# Patient Record
Sex: Male | Born: 1950
Health system: Southern US, Community
[De-identification: ages and names within clinical notes are randomized; demographics above are authoritative.]

## PROBLEM LIST (undated history)

## (undated) ENCOUNTER — Emergency Department (HOSPITAL_COMMUNITY): Admission: EM | Payer: Medicare HMO | Source: Home / Self Care

## (undated) DIAGNOSIS — I10 Essential (primary) hypertension: Secondary | ICD-10-CM

## (undated) DIAGNOSIS — F0280 Dementia in other diseases classified elsewhere without behavioral disturbance: Secondary | ICD-10-CM

## (undated) DIAGNOSIS — G309 Alzheimer's disease, unspecified: Secondary | ICD-10-CM

## (undated) DIAGNOSIS — G40909 Epilepsy, unspecified, not intractable, without status epilepticus: Secondary | ICD-10-CM

## (undated) DIAGNOSIS — N400 Enlarged prostate without lower urinary tract symptoms: Secondary | ICD-10-CM

## (undated) DIAGNOSIS — N189 Chronic kidney disease, unspecified: Secondary | ICD-10-CM

## (undated) HISTORY — DX: Essential (primary) hypertension: I10

## (undated) HISTORY — DX: Epilepsy, unspecified, not intractable, without status epilepticus: G40.909

## (undated) HISTORY — PX: NO PAST SURGERIES: SHX2092

---

## 2007-12-25 ENCOUNTER — Ambulatory Visit (HOSPITAL_COMMUNITY): Admission: RE | Admit: 2007-12-25 | Discharge: 2007-12-25 | Payer: Self-pay | Admitting: Internal Medicine

## 2010-01-25 ENCOUNTER — Emergency Department (HOSPITAL_COMMUNITY): Admission: EM | Admit: 2010-01-25 | Discharge: 2010-01-25 | Payer: Self-pay | Admitting: Emergency Medicine

## 2010-02-07 ENCOUNTER — Inpatient Hospital Stay (HOSPITAL_COMMUNITY): Admission: EM | Admit: 2010-02-07 | Discharge: 2010-02-09 | Payer: Self-pay | Admitting: Emergency Medicine

## 2010-10-15 LAB — CBC
HCT: 42.5 % (ref 39.0–52.0)
Hemoglobin: 12.4 g/dL — ABNORMAL LOW (ref 13.0–17.0)
Hemoglobin: 14.6 g/dL (ref 13.0–17.0)
MCH: 31.3 pg (ref 26.0–34.0)
MCH: 31.3 pg (ref 26.0–34.0)
MCHC: 34.7 g/dL (ref 30.0–36.0)
MCHC: 34.7 g/dL (ref 30.0–36.0)
MCHC: 34.3 g/dL (ref 30.0–36.0)
MCV: 90.3 fL (ref 78.0–100.0)
Platelets: 136 10*3/uL — ABNORMAL LOW (ref 150–400)
RBC: 3.81 MIL/uL — ABNORMAL LOW (ref 4.22–5.81)
RBC: 3.95 MIL/uL — ABNORMAL LOW (ref 4.22–5.81)
RBC: 4.67 MIL/uL (ref 4.22–5.81)
RDW: 11.1 % — ABNORMAL LOW (ref 11.5–15.5)
WBC: 6.6 10*3/uL (ref 4.0–10.5)
WBC: 7.5 10*3/uL (ref 4.0–10.5)
WBC: 6.5 10*3/uL (ref 4.0–10.5)

## 2010-10-15 LAB — GLUCOSE, CAPILLARY
Glucose-Capillary: 156 mg/dL — ABNORMAL HIGH (ref 70–99)
Glucose-Capillary: 181 mg/dL — ABNORMAL HIGH (ref 70–99)
Glucose-Capillary: 212 mg/dL — ABNORMAL HIGH (ref 70–99)
Glucose-Capillary: 213 mg/dL — ABNORMAL HIGH (ref 70–99)
Glucose-Capillary: 218 mg/dL — ABNORMAL HIGH (ref 70–99)
Glucose-Capillary: 240 mg/dL — ABNORMAL HIGH (ref 70–99)
Glucose-Capillary: 241 mg/dL — ABNORMAL HIGH (ref 70–99)
Glucose-Capillary: 258 mg/dL — ABNORMAL HIGH (ref 70–99)
Glucose-Capillary: 288 mg/dL — ABNORMAL HIGH (ref 70–99)
Glucose-Capillary: 294 mg/dL — ABNORMAL HIGH (ref 70–99)
Glucose-Capillary: 321 mg/dL — ABNORMAL HIGH (ref 70–99)
Glucose-Capillary: 393 mg/dL — ABNORMAL HIGH (ref 70–99)
Glucose-Capillary: >600 mg/dL (ref 70–99)
Glucose-Capillary: 307 mg/dL — ABNORMAL HIGH (ref 70–99)
Glucose-Capillary: 509 mg/dL — ABNORMAL HIGH (ref 70–99)

## 2010-10-15 LAB — BASIC METABOLIC PANEL
BUN: 18 mg/dL (ref 6–23)
BUN: 42 mg/dL — ABNORMAL HIGH (ref 6–23)
CO2: 26 mEq/L (ref 19–32)
Calcium: 8.9 mg/dL (ref 8.4–10.5)
Calcium: 9.3 mg/dL (ref 8.4–10.5)
Calcium: 9.4 mg/dL (ref 8.4–10.5)
Calcium: 9.5 mg/dL (ref 8.4–10.5)
Chloride: 102 mEq/L (ref 96–112)
Chloride: 92 mEq/L — ABNORMAL LOW (ref 96–112)
Chloride: 98 mEq/L (ref 96–112)
Creatinine, Ser: 1.33 mg/dL (ref 0.4–1.5)
Creatinine, Ser: 2.26 mg/dL — ABNORMAL HIGH (ref 0.4–1.5)
GFR calc Af Amer: 56 mL/min — ABNORMAL LOW (ref >60)
GFR calc Af Amer: >60 mL/min (ref >60)
GFR calc Af Amer: >60 mL/min (ref >60)
GFR calc non Af Amer: 30 mL/min — ABNORMAL LOW (ref >60)
GFR calc non Af Amer: 53 mL/min — ABNORMAL LOW (ref >60)
Glucose, Bld: 133 mg/dL — ABNORMAL HIGH (ref 70–99)
Glucose, Bld: 202 mg/dL — ABNORMAL HIGH (ref 70–99)
Potassium: 4.5 mEq/L (ref 3.5–5.1)
Potassium: 5.3 mEq/L — ABNORMAL HIGH (ref 3.5–5.1)
Sodium: 131 mEq/L — ABNORMAL LOW (ref 135–145)
Sodium: 132 mEq/L — ABNORMAL LOW (ref 135–145)
Sodium: 133 mEq/L — ABNORMAL LOW (ref 135–145)
Sodium: 133 mEq/L — ABNORMAL LOW (ref 135–145)

## 2010-10-15 LAB — DIFFERENTIAL
Basophils Absolute: 0 10*3/uL (ref 0.0–0.1)
Basophils Relative: 0 % (ref 0–1)
Basophils Relative: 0 % (ref 0–1)
Eosinophils Absolute: 0.1 10*3/uL (ref 0.0–0.7)
Eosinophils Absolute: 0.1 10*3/uL (ref 0.0–0.7)
Eosinophils Absolute: 0.1 10*3/uL (ref 0.0–0.7)
Eosinophils Relative: 1 % (ref 0–5)
Eosinophils Relative: 1 % (ref 0–5)
Lymphocytes Relative: 24 % (ref 12–46)
Lymphocytes Relative: 27 % (ref 12–46)
Lymphs Abs: 1.8 10*3/uL (ref 0.7–4.0)
Lymphs Abs: 2.2 10*3/uL (ref 0.7–4.0)
Lymphs Abs: 1.8 10*3/uL (ref 0.7–4.0)
Monocytes Absolute: 0.6 10*3/uL (ref 0.1–1.0)
Monocytes Relative: 7 % (ref 3–12)
Monocytes Relative: 7 % (ref 3–12)
Neutro Abs: 4.6 10*3/uL (ref 1.7–7.7)
Neutro Abs: 5.5 10*3/uL (ref 1.7–7.7)
Neutrophils Relative %: 61 % (ref 43–77)
Neutrophils Relative %: 62 % (ref 43–77)
Neutrophils Relative %: 61 % (ref 43–77)

## 2010-10-15 LAB — URINALYSIS, ROUTINE W REFLEX MICROSCOPIC
Bilirubin Urine: NEGATIVE
Glucose, UA: >1000 mg/dL — AB
Glucose, UA: >1000 mg/dL — AB
Ketones, ur: NEGATIVE mg/dL
Ketones, ur: NEGATIVE mg/dL
Nitrite: NEGATIVE
Protein, ur: NEGATIVE mg/dL
Specific Gravity, Urine: 1.015 (ref 1.005–1.030)
Urobilinogen, UA: 0.2 mg/dL (ref 0.0–1.0)
pH: 5 (ref 5.0–8.0)

## 2010-10-15 LAB — URINE MICROSCOPIC-ADD ON

## 2010-10-15 LAB — PHOSPHORUS
Phosphorus: 3.9 mg/dL (ref 2.3–4.6)
Phosphorus: 4.1 mg/dL (ref 2.3–4.6)
Phosphorus: 4.6 mg/dL (ref 2.3–4.6)

## 2010-10-15 LAB — MAGNESIUM
Magnesium: 2.1 mg/dL (ref 1.5–2.5)
Magnesium: 2.1 mg/dL (ref 1.5–2.5)
Magnesium: 2.2 mg/dL (ref 1.5–2.5)

## 2010-10-15 LAB — HEMOGLOBIN A1C
Hgb A1c MFr Bld: 15 % — ABNORMAL HIGH (ref <5.7)
Mean Plasma Glucose: 384 mg/dL — ABNORMAL HIGH (ref <117)

## 2010-10-15 LAB — COMPREHENSIVE METABOLIC PANEL
ALT: 98 U/L — ABNORMAL HIGH (ref 0–53)
AST: 105 U/L — ABNORMAL HIGH (ref 0–37)
Alkaline Phosphatase: 112 U/L (ref 39–117)
CO2: 27 mEq/L (ref 19–32)
Calcium: 9.7 mg/dL (ref 8.4–10.5)
Chloride: 92 mEq/L — ABNORMAL LOW (ref 96–112)
GFR calc Af Amer: 54 mL/min — ABNORMAL LOW (ref >60)
GFR calc non Af Amer: 44 mL/min — ABNORMAL LOW (ref >60)
Glucose, Bld: 644 mg/dL (ref 70–99)
Potassium: 4.7 mEq/L (ref 3.5–5.1)
Sodium: 129 mEq/L — ABNORMAL LOW (ref 135–145)

## 2010-10-15 LAB — BLOOD GAS, ARTERIAL: Patient temperature: 37

## 2010-10-15 LAB — LIPID PANEL
Cholesterol: 113 mg/dL (ref 0–200)
Triglycerides: 197 mg/dL — ABNORMAL HIGH (ref <150)

## 2010-10-15 LAB — KETONES, QUALITATIVE: Acetone, Bld: NEGATIVE

## 2010-10-15 LAB — CK TOTAL AND CKMB (NOT AT ARMC)
CK, MB: 1.9 ng/mL (ref 0.3–4.0)
Relative Index: 1.7 (ref 0.0–2.5)

## 2010-10-15 LAB — TROPONIN I: Troponin I: 0.01 ng/mL (ref 0.00–0.06)

## 2010-10-15 LAB — GLUCOSE, RANDOM: Glucose, Bld: 434 mg/dL — ABNORMAL HIGH (ref 70–99)

## 2010-10-15 LAB — LACTIC ACID, PLASMA: Lactic Acid, Venous: 4.5 mmol/L — ABNORMAL HIGH (ref 0.5–2.2)

## 2010-12-15 NOTE — Procedures (Signed)
Tony [name], LACASSE                   ACCOUNT NO.:  [id number]   MEDICAL RECORD NO.:  [id number]          PATIENT TYPE:  OUT   LOCATION:  RESP                          FACILITY:  [facility]   PHYSICIAN:  Kofi A. Gerilyn Pilgrim, M.D. DATE OF BIRTH:  1950/11/02   DATE OF PROCEDURE:  DATE OF DISCHARGE:  12/25/2007                              EEG INTERPRETATION   INDICATIONS:  This is a 60 year old male who presents with spell  suspicious for seizure activity.  The patient does not have a history of  seizure disorder at baseline.   BASELINE MEDICATIONS:  1. Dilantin.  2. Tegretol.   ANALYSIS:  A 16-channel recording is conducted for 20 minutes.  There is  well-formed posterior rhythm of 9.5 Hz, which attenuates with the eye  opening. There is beta activity noted in the frontal areas.  Awake and  drowsy activities are recorded.  Photic stimulation and hyperventilation  are conducted without significant changes in the background activity.  There is no focal or lateralized slowing. There is no epileptiform  activity observed.   IMPRESSION:  Normal recording of awake and drowsy states.  A single  recording does not rule out epileptic seizures.  It clinically indicated  a prolonged EEG or sleep deprived recording may be useful.      Kofi A. Gerilyn Pilgrim, M.D.  Electronically Signed     KAD/MEDQ  D:  12/29/2007  T:  12/29/2007  Job:  [id number]

## 2011-12-12 ENCOUNTER — Ambulatory Visit (INDEPENDENT_AMBULATORY_CARE_PROVIDER_SITE_OTHER): Payer: Self-pay | Admitting: Internal Medicine

## 2011-12-12 ENCOUNTER — Encounter: Payer: Self-pay | Admitting: Internal Medicine

## 2011-12-12 DIAGNOSIS — I10 Essential (primary) hypertension: Secondary | ICD-10-CM

## 2011-12-12 DIAGNOSIS — Z79899 Other long term (current) drug therapy: Secondary | ICD-10-CM

## 2011-12-12 DIAGNOSIS — R7303 Prediabetes: Secondary | ICD-10-CM | POA: Insufficient documentation

## 2011-12-12 DIAGNOSIS — E119 Type 2 diabetes mellitus without complications: Secondary | ICD-10-CM

## 2011-12-12 DIAGNOSIS — G40909 Epilepsy, unspecified, not intractable, without status epilepticus: Secondary | ICD-10-CM | POA: Insufficient documentation

## 2011-12-12 LAB — CBC WITH DIFFERENTIAL/PLATELET
Basophils Absolute: 0 10*3/uL (ref 0.0–0.1)
Basophils Relative: 1 % (ref 0–1)
Lymphocytes Relative: 42 % (ref 12–46)
MCHC: 33.3 g/dL (ref 30.0–36.0)
Neutro Abs: 3.8 10*3/uL (ref 1.7–7.7)
Neutrophils Relative %: 53 % (ref 43–77)
RDW: 13.1 % (ref 11.5–15.5)
WBC: 7.2 10*3/uL (ref 4.0–10.5)

## 2011-12-12 LAB — COMPREHENSIVE METABOLIC PANEL
ALT: 15 U/L (ref 0–53)
AST: 17 U/L (ref 0–37)
Albumin: 4.4 g/dL (ref 3.5–5.2)
BUN: 13 mg/dL (ref 6–23)
CO2: 24 mEq/L (ref 19–32)
Calcium: 9.5 mg/dL (ref 8.4–10.5)
Chloride: 105 mEq/L (ref 96–112)
Creat: 1.26 mg/dL (ref 0.50–1.35)
Potassium: 4 mEq/L (ref 3.5–5.3)

## 2011-12-12 LAB — POCT GLYCOSYLATED HEMOGLOBIN (HGB A1C): Hemoglobin A1C: 6.1

## 2011-12-12 LAB — LIPID PANEL: LDL Cholesterol: 58 mg/dL (ref 0–99)

## 2011-12-12 LAB — GLUCOSE, CAPILLARY: Glucose-Capillary: 88 mg/dL (ref 70–99)

## 2011-12-12 MED ORDER — METFORMIN HCL 1000 MG PO TABS: 1000.0000 mg | ORAL_TABLET | Freq: Two times a day (BID) | ORAL | Status: DC

## 2011-12-12 MED ORDER — LISINOPRIL-HYDROCHLOROTHIAZIDE 10-12.5 MG PO TABS: 1.0000 | ORAL_TABLET | Freq: Every day | ORAL | Status: DC

## 2011-12-12 NOTE — Progress Notes (Signed)
Subjective:     Patient ID: Tony [name], male   DOB: [birth year], 61 y.o.   MRN: [id number]  HPI [name] is a pleasant 61 year old gentleman with hx of HTN, DM, and seizure disorder who presents to the clinic as a new patient to establish care. He was previously seeing a Dr. Loleta Chance at the [facility], but due to financial reasons, he has chosen to relocate. 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   He has no complaints or concerns today. He denies chest pain, cough, sob, headache, N/V, changes in abdominal and urinary character.  He has not had seizures in several years and can't remember his last seizure. Furthermore he has not taken his AED since 2011.  He checks his CBGs daily which are in the 100s. No low CBGs, denies hypoglycemic episodes such as dizziness or lightheadedness. Exercises regularly and watches diet closely.    Review of Systems  All other systems reviewed and are negative.       Objective:   Physical Exam  Constitutional: He is oriented to person, place, and time. He appears well-developed.  HENT:  Head: Normocephalic and atraumatic.  Eyes: EOM are normal. Pupils are equal, round, and reactive to light.  Neck: Normal range of motion. Neck supple.  Cardiovascular: Normal rate, regular rhythm and normal heart sounds.  Exam reveals no gallop and no friction rub.   No murmur heard. Pulmonary/Chest: Effort normal and breath sounds normal.  Abdominal: Soft. Bowel sounds are normal.  Musculoskeletal: Normal range of motion.  Neurological: He is alert and oriented to person, place, and time.  Psychiatric: He has a normal mood and affect.

## 2011-12-12 NOTE — Assessment & Plan Note (Signed)
Lab Results  Component Value Date   HGBA1C 6.1 12/12/2011   HGBA1C  Value: 15.0 (NOTE)                                                                       According to the ADA Clinical Practice Recommendations for 2011, when HbA1c is used as a screening test:   >=6.5%   Diagnostic of Diabetes Mellitus           (if abnormal result  is confirmed)  5.7-6.4%   Increased risk of developing Diabetes Mellitus  References:Diagnosis and Classification of Diabetes Mellitus,Diabetes Care,2011,34(Suppl 1):S62-S69 and Standards of Medical Care in         Diabetes - 2011,Diabetes Care,2011,34  (Suppl 1):S11-S61.* 02/07/2010   CREATININE 1.33 02/09/2010   CHOL  Value: 113        ATP III CLASSIFICATION:  <200     mg/dL   Desirable  454-098  mg/dL   Borderline High  >=119    mg/dL   High        1/47/8295   HDL 38* 02/07/2010   TRIG 197* 02/07/2010    Last eye exam and foot exam: No results found for this basename: HMDIABEYEEXA, HMDIABFOOTEX    Assessment: Diabetes control: controlled Progress toward goals: at goal Barriers to meeting goals: no barriers identified  Plan: Diabetes treatment: continue current medications Refer to: none Instruction/counseling given: reminded to bring blood glucose meter & log to each visit and reminded to bring medications to each visit

## 2011-12-12 NOTE — Patient Instructions (Signed)
Please do not take your seizure medications. Please take your blood pressure medicine as directed.  Please follow up in 2 weeks to recheck blood pressure and discuss lab work.

## 2011-12-12 NOTE — Assessment & Plan Note (Signed)
Lab Results  Component Value Date   NA 134* 02/09/2010   K 5.1 02/09/2010   CL 104 02/09/2010   CO2 25 02/09/2010   BUN 18 DELTA CHECK NOTED 02/09/2010   CREATININE 1.33 02/09/2010    BP Readings from Last 3 Encounters:  12/12/11 158/82  Rechecked 158/70  Assessment: Hypertension control:  mildly elevated  Progress toward goals:  unable to assess Barriers to meeting goals:  no barriers identified and perhaps white coat htn  Plan: Hypertension treatment:  continue current medications will recheck BP in 2 weeks, and if it is still elevated, will consider increasing dose of Lisinopril/HCTZ. Also check lytes and renal function

## 2011-12-12 NOTE — Assessment & Plan Note (Addendum)
Patient has been seizure free for several years. According to up to date, it is safe to discontinue AEDs if the patient has been seizure free for at least 2-4 years and patient has been seizure free for more than that. If patient does develop new seizures, would restart therapy, and can even consider newer AEDs like keppra. Furthermore, patient states he has not been on Depakote or tegetrol for over 2 years and has been seizure free. Will d/c and see how he does. Will check CBC and CMET.

## 2011-12-13 LAB — MICROALBUMIN / CREATININE URINE RATIO
Creatinine, Urine: 223.7 mg/dL
Microalb Creat Ratio: 2.9 mg/g (ref 0.0–30.0)
Microalb, Ur: 0.65 mg/dL (ref 0.00–1.89)

## 2012-01-14 ENCOUNTER — Other Ambulatory Visit: Payer: Self-pay | Admitting: *Deleted

## 2012-01-14 DIAGNOSIS — I10 Essential (primary) hypertension: Secondary | ICD-10-CM

## 2012-01-14 MED ORDER — LISINOPRIL-HYDROCHLOROTHIAZIDE 10-12.5 MG PO TABS: 1.0000 | ORAL_TABLET | Freq: Every day | ORAL | Status: DC

## 2012-01-14 NOTE — Telephone Encounter (Signed)
Pt was seen as new pt. Asked to recheck BP 2 week F/U. We never gave pt an appt. Please have pt make appt within next 90 days - no hurry since all other conditions were well controlled.

## 2012-01-14 NOTE — Telephone Encounter (Signed)
Note sent to front desk pool for appt FU BP within next 90 days.

## 2012-03-10 ENCOUNTER — Encounter: Payer: Self-pay | Admitting: Internal Medicine

## 2012-03-12 ENCOUNTER — Other Ambulatory Visit: Payer: Self-pay | Admitting: Internal Medicine

## 2012-03-12 NOTE — Telephone Encounter (Signed)
[name] needs to reschedule his appointment to see Dr. Burtis Junes.  All medical conditions are controlled, but will need BP follow up and labs as per note in May.

## 2012-04-30 ENCOUNTER — Other Ambulatory Visit: Payer: Self-pay | Admitting: Internal Medicine

## 2012-04-30 DIAGNOSIS — I10 Essential (primary) hypertension: Secondary | ICD-10-CM

## 2012-06-06 ENCOUNTER — Encounter: Payer: Self-pay | Admitting: Internal Medicine

## 2012-06-22 ENCOUNTER — Other Ambulatory Visit: Payer: Self-pay | Admitting: Internal Medicine

## 2012-06-23 NOTE — Telephone Encounter (Signed)
Has appt 12/20 with Dr Burtis Junes. Cancelled last 2 aptts. Only seen once in May as new pt. Pt needs to keep appt to cont to receive refills.

## 2012-07-15 ENCOUNTER — Other Ambulatory Visit: Payer: Self-pay | Admitting: Internal Medicine

## 2012-07-18 ENCOUNTER — Encounter: Payer: Self-pay | Admitting: Internal Medicine

## 2012-07-21 ENCOUNTER — Other Ambulatory Visit: Payer: Self-pay | Admitting: Internal Medicine

## 2012-07-21 NOTE — Telephone Encounter (Signed)
Left message at pharmacy line - pt needs appt per Dr Burtis Junes.

## 2012-08-01 ENCOUNTER — Encounter: Payer: Self-pay | Admitting: Internal Medicine

## 2012-08-01 ENCOUNTER — Ambulatory Visit (INDEPENDENT_AMBULATORY_CARE_PROVIDER_SITE_OTHER): Payer: Self-pay | Admitting: Internal Medicine

## 2012-08-01 VITALS — BP 145/74 | HR 63 | Temp 97.4°F | Ht 71.0 in | Wt 227.9 lb

## 2012-08-01 DIAGNOSIS — E119 Type 2 diabetes mellitus without complications: Secondary | ICD-10-CM

## 2012-08-01 DIAGNOSIS — Z79899 Other long term (current) drug therapy: Secondary | ICD-10-CM

## 2012-08-01 DIAGNOSIS — I10 Essential (primary) hypertension: Secondary | ICD-10-CM

## 2012-08-01 MED ORDER — LISINOPRIL-HYDROCHLOROTHIAZIDE 10-12.5 MG PO TABS: 1.0000 | ORAL_TABLET | Freq: Every day | ORAL | Status: DC

## 2012-08-01 NOTE — Progress Notes (Signed)
Subjective:   Patient ID: Tony [name] male   DOB: 1950/10/07 62 y.o.   MRN: [id number]  HPI: Tony [name] is a 62 y.o. man pmh DM, HTN and remote hx of seizure disorder not currently on medication presents for regular follow up and medication refill. Tony [name] is doing well since his hospitalization several years ago and had no hyper or hypoglycemia symptoms or CBG readings. He has been compliant with his medications. He has run out of his HTN medications within this week and is here for a refill. He lost his wife after a prolonged illness and served as her primary caregiver until her death last year. He has positive reflection on his memories of her and has not had grieving symptoms that disturb his ADLs. He is otherwise doing well w/o compliants.    Past Medical History  Diagnosis Date  . Diabetes mellitus     Type II, diagnosed 12-25-2009, not on insulin  . HTN (hypertension)   . Seizure disorder     diagnosed in childhood, last seizure was years ago  . Hyperglycemia Dec 25, 2009    admitted for hyperglycemia   Current Outpatient Prescriptions  Medication Sig Dispense Refill  . lisinopril-hydrochlorothiazide (PRINZIDE,ZESTORETIC) 10-12.5 MG per tablet Take 1 tablet by mouth daily.  30 tablet  12  . metFORMIN (GLUCOPHAGE) 1000 MG tablet Take 1 tablet (1,000 mg total) by mouth 2 (two) times daily with a meal.  60 tablet  11   Family History  Problem Relation Age of Onset  . Diabetes Mother   . Hypertension Mother    History   Social History  . Marital Status: Married    Spouse Name: N/A    Number of Children: 2  . 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 Smoking status: Never Smoker   . Smokeless tobacco: Never Used  . Alcohol Use: No  . Drug Use: No  . Sexually Active: No     Comment: wife passed away in 2010/08/27  Other Topics Concern  . None   Social History Narrative   [place]  sonHis wife passed away in Dec 26, 2010    Review of Systems: otherwise negative unless listed in HPI  Objective:  Physical Exam: Filed Vitals:   08/01/12 1335  BP: 145/74  Pulse: 63  Temp: 97.4 F (36.3 C)  TempSrc: Oral  Height: 5\' 11"  (1.803 m)  Weight: 227 lb 14.4 oz (103.375 kg)  SpO2: 98%   General: sitting in chair, NAD HEENT: PERRL, EOMI, no scleral icterus, MMM but several missing teeth and poor dentition Cardiac: RRR, no rubs, murmurs or gallops Pulm: clear to auscultation bilaterally, moving normal volumes of air Abd: soft, nontender, nondistended, BS present Ext: warm and well perfused, no pedal edema Neuro: alert and oriented X3, cranial nerves II-XII grossly intact  Assessment & Plan:  1. Diabetes type 2 well controlled: Patient's hemoglobin A1c today was 6.0, patient has been compliant with his metformin and is not on insulin or ever required insulin in the past. Patient had labs done on 5/13 that showed no proteinuria microalbumin area and an LDL of 58. -Continue metformin 1000 twice a day  2.Hypertension: Patient has been unable to receive medication to do some financial issues and ran out of medications this week but has otherwise been compliant with his lisinopril/hydrochlorothiazide. Today his blood pressure is 145/74  and previously at his last visit was 158/70. -Refill lisinopril/hydrochlorothiazide 10-12.5mg   Pt discussed with Dr. Dalphine Handing

## 2013-01-07 ENCOUNTER — Encounter: Payer: Self-pay | Admitting: Dietician

## 2013-02-05 ENCOUNTER — Other Ambulatory Visit: Payer: Self-pay

## 2013-05-26 ENCOUNTER — Other Ambulatory Visit: Payer: Self-pay | Admitting: Internal Medicine

## 2013-05-27 NOTE — Telephone Encounter (Signed)
Message sent to front desk to sched pt an appt.

## 2013-05-27 NOTE — Telephone Encounter (Signed)
Patient needs an appointment

## 2013-05-29 ENCOUNTER — Encounter: Payer: Self-pay | Admitting: Internal Medicine

## 2013-06-19 ENCOUNTER — Encounter: Payer: Self-pay | Admitting: Internal Medicine

## 2013-06-19 ENCOUNTER — Ambulatory Visit (INDEPENDENT_AMBULATORY_CARE_PROVIDER_SITE_OTHER): Payer: Self-pay | Admitting: Internal Medicine

## 2013-06-19 VITALS — BP 150/88 | HR 71 | Temp 97.7°F | Ht 71.0 in | Wt 227.8 lb

## 2013-06-19 DIAGNOSIS — E119 Type 2 diabetes mellitus without complications: Secondary | ICD-10-CM

## 2013-06-19 DIAGNOSIS — E1169 Type 2 diabetes mellitus with other specified complication: Secondary | ICD-10-CM

## 2013-06-19 DIAGNOSIS — I10 Essential (primary) hypertension: Secondary | ICD-10-CM

## 2013-06-19 DIAGNOSIS — N529 Male erectile dysfunction, unspecified: Secondary | ICD-10-CM

## 2013-06-19 LAB — POCT GLYCOSYLATED HEMOGLOBIN (HGB A1C): Hemoglobin A1C: 5.9

## 2013-06-19 LAB — GLUCOSE, CAPILLARY: Glucose-Capillary: 108 mg/dL — ABNORMAL HIGH (ref 70–99)

## 2013-06-19 MED ORDER — SILDENAFIL CITRATE 50 MG PO TABS: 50.0000 mg | ORAL_TABLET | ORAL | Status: DC | PRN

## 2013-06-19 MED ORDER — LISINOPRIL-HYDROCHLOROTHIAZIDE 10-12.5 MG PO TABS: ORAL_TABLET | ORAL | Status: DC

## 2013-06-19 NOTE — Patient Instructions (Signed)
LIFESTYLE TIPS TO HELP WITH YOUR BLOOD PRESSURE CONTROL  WEIGHT REDUCTION:  Strategies: A healthy weight loss program includes:  A calorie restricted diet based on individual calorie needs.   Increased physical activity (exercise).  An exercise program is just as important as the right low-calorie diet.    An unhealthy weight loss program includes:  Fasting.   Fad diets.   Supplements and drugs.  These choices do not succeed in long-term weight control.   Home Care Instructions: To help you make the needed dietary changes:   Exercise and perform physical activity as directed by your caregiver.   Keep a daily record of everything you eat. There are many free websites to help you with this. It may be helpful to measure your foods so you can determine if you are eating the correct portion sizes.   Use low-calorie cookbooks or take special cooking classes.   Avoid alcohol. Drink more water and drinks with no calories.   Take vitamins and supplements only as recommended by your caregiver.   Weight loss support groups, Registered Dieticians, counselors, and stress reduction education can also be very helpful.   ________________________________________________________________________  DASH DIET:  The DASH diet stands for "Dietary Approaches to Stop Hypertension." It is a healthy eating plan that has been shown to reduce high blood pressure (hypertension) in as little as 14 days, while also possibly providing other significant health benefits. These other health benefits include reducing the risk of breast cancer after menopause and reducing the risk of type 2 diabetes, heart disease, colon cancer, and stroke. Health benefits also include weight loss and slowing kidney failure in patients with chronic kidney disease.   Diet guidelines: Limit salt (sodium). Your diet should contain less than 1500 mg of sodium daily.  Limit refined or processed carbohydrates. Your diet should  include mostly whole grains. Desserts and added sugars should be used sparingly.  Include small amounts of heart-healthy fats. These types of fats include nuts, oils, and tub margarine. Limit saturated and trans fats. These fats have been shown to be harmful in the body.   Choosing Foods: The following food groups are based on a 2000 calorie diet. See your Registered Dietitian for individual calorie needs.  Grains and Grain Products (6 to 8 servings daily)  Eat More Often: Whole-wheat bread, brown rice, whole-grain or wheat pasta, quinoa, popcorn without added fat or salt (air popped).  Eat Less Often: White bread, white pasta, white rice, cornbread.  Vegetables (4 to 5 servings daily)  Eat More Often: Fresh, frozen, and canned vegetables. Vegetables may be raw, steamed, roasted, or grilled with a minimal amount of fat.  Eat Less Often/Avoid: Creamed or fried vegetables. Vegetables in a cheese sauce.  Fruit (4 to 5 servings daily)  Eat More Often: All fresh, canned (in natural juice), or frozen fruits. Dried fruits without added sugar. One hundred percent fruit juice ( cup [237 mL] daily).  Eat Less Often: Dried fruits with added sugar. Canned fruit in light or heavy syrup.  Lean Meats, Fish, and Poultry (2 servings or less daily. One serving is 3 to 4 oz [85-114 g]).  Eat More Often: Ninety percent or leaner ground beef, tenderloin, sirloin. Round cuts of beef, chicken breast, turkey breast. All fish. Grill, bake, or broil your meat. Nothing should be fried.  Eat Less Often/Avoid: Fatty cuts of meat, turkey, or chicken leg, thigh, or wing. Fried cuts of meat or fish.  Dairy (2 to 3 servings)  Eat More   Often: Low-fat or fat-free milk, low-fat plain or light yogurt, reduced-fat or part-skim cheese.  Eat Less Often/Avoid: Milk (whole, 2%, skim, or chocolate). Whole milk yogurt. Full-fat cheeses.  Nuts, Seeds, and Legumes (4 to 5 servings per week)  Eat More Often: All without added salt.  Eat  Less Often/Avoid: Salted nuts and seeds, canned beans with added salt.  Fats and Sweets (limited)  Eat More Often: Vegetable oils, tub margarines without trans fats, sugar-free gelatin. Mayonnaise and salad dressings.  Eat Less Often/Avoid: Coconut oils, palm oils, butter, stick margarine, cream, half and half, cookies, candy, pie.   ________________________________________________________________________  Smoking Cessation Tips 1-800-QUIT-NOW  This document explains the best ways for you to quit smoking and new treatments to help. It lists new medicines that can double or triple your chances of quitting and quitting for good. It also considers ways to avoid relapses and concerns you may have about quitting, including weight gain.   Nicotine: A Powerful Addiction If you have tried to quit smoking, you know how hard it can be. It is hard because nicotine is a very addictive drug. For some people, it can be as addictive as heroin or cocaine. Usually, people make 2 or 3 tries, or more, before finally being able to quit. Each time you try to quit, you can learn about what helps and what hurts. Quitting takes hard work and a lot of effort, but you can quit smoking.   Quitting smoking is one of the most important things you will ever do You will live longer, feel better, and live better.  The impact on your body of quitting smoking is felt almost immediately:   Five keys to quitting: Studies have shown that these 5 steps will help you quit smoking and quit for good. You have the best chances of quitting if you use them together:   1. GET READY  Set a quit date.  Change your environment.  Get rid of ALL cigarettes, ashtrays, matches, and lighters in your home, car, and place of work.  Do not let people smoke in your home.  Review your past attempts to quit. Think about what worked and what did not.  Once you quit, do not smoke. NOT EVEN A PUFF!   2. GET SUPPORT AND ENCOURAGEMENT  Tell your  family, friends, and coworkers that you are going to quit and need their support. Ask them not to smoke around you.  Get individual, group, or telephone counseling and support.  Many smokers find one or more of the many self-help books available useful in helping them quit and stay off tobacco.   3. LEARN NEW SKILLS AND BEHAVIORS  Try to distract yourself from urges to smoke. Talk to someone, go for a walk, or occupy your time with a task.  When you first try to quit, change your routine. Take a different route to work. Drink tea instead of coffee. Eat breakfast in a different place.  Do something to reduce your stress. Take a hot bath, exercise, or read a book.  Plan something enjoyable to do every day. Reward yourself for not smoking.  Explore interactive web-based programs that specialize in helping you quit.   4. GET MEDICINE AND USE IT CORRECTLY .  Medicines can help you stop smoking and decrease the urge to smoke. Combining medicine with the above behavioral methods and support can quadruple your chances of successfully quitting smoking.  Talk with your doctor about these options.  5. BE PREPARED FOR RELAPSE   OR DIFFICULT SITUATIONS  Most relapses occur within the first 3 months after quitting. Do not be discouraged if you start smoking again. Remember, most people try several times before they finally quit.  You may have symptoms of withdrawal because your body is used to nicotine. You may crave cigarettes, be irritable, feel very hungry, cough often, get headaches, or have difficulty concentrating.  The withdrawal symptoms are only temporary. They are strongest when you first quit, but they will go away within 10 to 14 days.   Quitting takes hard work and a lot of effort, but you can quit smoking.   FOR MORE INFORMATION  Smokefree.gov (http://www.smokefree.gov) provides free, accurate, evidence-based information and professional assistance to help support the immediate and long-term  needs of people trying to quit smoking.  Document Released: 07/10/2001 Document Re-Released: 01/03/2010  ExitCare Patient Information 2011 [employer].    

## 2013-06-19 NOTE — Progress Notes (Signed)
Subjective:   Patient ID: Tony [name] male   DOB: 1951/01/06 62 y.o.   MRN: [id number]  HPI: Tony [name] is a 62 y.o. man pmh DM, HTN and remote hx of seizure disorder not currently on medication presents for  medication refill. [name] is doing well but has had marked limited income in setting of his recent wife's death. He has not been checking his CBGs but reports no symptoms of hypoglycemia.   He has found a new "friend" whom he would like to have sexual relationship with but is having some problems maintaining erections to complete sexual activity. Pt doesn't have LBP, paraesthesia, urinary incontinence, penile drainage/mucus/blood, and only slight anxiety with sexual activity.   Past Medical History  Diagnosis Date  . Diabetes mellitus     Type II, diagnosed December 28, 2009, not on insulin  . HTN (hypertension)   . Seizure disorder     diagnosed in childhood, last seizure was years ago  . Hyperglycemia 2009-12-28    admitted for hyperglycemia   Current Outpatient Prescriptions  Medication Sig Dispense Refill  . lisinopril-hydrochlorothiazide (PRINZIDE,ZESTORETIC) 10-12.5 MG per tablet TAKE ONE TABLET BY MOUTH EVERY DAY  30 tablet  12  . metFORMIN (GLUCOPHAGE) 1000 MG tablet Take 1 tablet (1,000 mg total) by mouth 2 (two) times daily with a meal.  60 tablet  11  . sildenafil (VIAGRA) 50 MG tablet Take 1 tablet (50 mg total) by mouth as needed for erectile dysfunction.  10 tablet  1   No current facility-administered medications for this visit.   Family History  Problem Relation Age of Onset  . Diabetes Mother   . Hypertension Mother    History   Social History  . Marital Status: Married    Spouse Name: N/A    Number of Children: 2  . 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 Smoking status: Never Smoker   . Smokeless tobacco: Never Used  . Alcohol Use: No  . Drug Use: No  . Sexual Activity: No   Comment: wife passed away in Aug 30, 2010  Other Topics Concern  . None   Social History Narrative   Lives in [place] with son   His wife passed away in 12/29/10    Review of Systems: otherwise negative unless listed in HPI  Objective:  Physical Exam: Filed Vitals:   06/19/13 1458  BP: 150/88  Pulse: 71  Temp: 97.7 F (36.5 C)  TempSrc: Oral  Height: 5\' 11"  (1.803 m)  Weight: 227 lb 12.8 oz (103.329 kg)  SpO2: 96%   General: sitting in chair, NAD HEENT: PERRL, EOMI, no scleral icterus, MMM but several missing teeth and poor dentition Cardiac: RRR, no rubs, murmurs or gallops Pulm: clear to auscultation bilaterally, moving normal volumes of air Abd: soft, nontender, nondistended, BS present Ext: warm and well perfused, no pedal edema Neuro: alert and oriented X3, cranial nerves II-XII grossly intact  Assessment & Plan:  1. Diabetes type 2 well controlled: Patient's hemoglobin A1c today was 5.9, patient has been compliant with his metformin and is not on insulin or ever required insulin in the past. Patient had labs done on 5/13 that showed no proteinuria microalbumin area and an LDL of 58. -Continue metformin 1000 twice a day  2.Hypertension: Patient has been unable to receive medication to do some financial issues and ran out of medications  this week but has otherwise been compliant with his lisinopril/hydrochlorothiazide. Today his blood pressure is 145/74 and previously at his last visit was 158/70. -Refill lisinopril/hydrochlorothiazide 10-12.5mg  -pt is unable to afford other medication and lab tests at this time.   3. ED: Pt w/o warning symptoms. Extensive education into safety and concerns regarding Viagra was discussed with the patient. -viagra   Workup and further management including flu shot, urine micro, lipid panel, retinal exam, and bmet all deferred by patient until can apply for orange card.   Pt discussed with Dr. Josem Kaufmann

## 2013-06-21 NOTE — Progress Notes (Signed)
Case discussed with Dr. Sadek soon after the resident saw the patient.  We reviewed the resident's history and exam and pertinent patient test results.  I agree with the assessment, diagnosis and plan of care documented in the resident's note. 

## 2013-08-10 ENCOUNTER — Ambulatory Visit (INDEPENDENT_AMBULATORY_CARE_PROVIDER_SITE_OTHER): Payer: Self-pay | Admitting: Internal Medicine

## 2013-08-10 ENCOUNTER — Telehealth: Payer: Self-pay | Admitting: *Deleted

## 2013-08-10 ENCOUNTER — Encounter: Payer: Self-pay | Admitting: Internal Medicine

## 2013-08-10 VITALS — BP 145/85 | HR 63 | Temp 98.3°F | Ht 71.0 in | Wt 229.5 lb

## 2013-08-10 DIAGNOSIS — G40909 Epilepsy, unspecified, not intractable, without status epilepticus: Secondary | ICD-10-CM

## 2013-08-10 LAB — COMPREHENSIVE METABOLIC PANEL
ALBUMIN: 4.3 g/dL (ref 3.5–5.2)
ALT: 12 U/L (ref 0–53)
AST: 17 U/L (ref 0–37)
Alkaline Phosphatase: 88 U/L (ref 39–117)
BUN: 9 mg/dL (ref 6–23)
CALCIUM: 9.6 mg/dL (ref 8.4–10.5)
CHLORIDE: 106 meq/L (ref 96–112)
CO2: 28 meq/L (ref 19–32)
Creat: 1.43 mg/dL — ABNORMAL HIGH (ref 0.50–1.35)
GLUCOSE: 119 mg/dL — AB (ref 70–99)
POTASSIUM: 4.5 meq/L (ref 3.5–5.3)
SODIUM: 142 meq/L (ref 135–145)
TOTAL PROTEIN: 7.4 g/dL (ref 6.0–8.3)
Total Bilirubin: 0.8 mg/dL (ref 0.3–1.2)

## 2013-08-10 LAB — CBC WITH DIFFERENTIAL/PLATELET
Basophils Absolute: 0 10*3/uL (ref 0.0–0.1)
Basophils Relative: 1 % (ref 0–1)
Eosinophils Absolute: 0.2 10*3/uL (ref 0.0–0.7)
Eosinophils Relative: 3 % (ref 0–5)
HCT: 41.9 % (ref 39.0–52.0)
HEMOGLOBIN: 14.5 g/dL (ref 13.0–17.0)
LYMPHS ABS: 1.8 10*3/uL (ref 0.7–4.0)
Lymphocytes Relative: 33 % (ref 12–46)
MCH: 30.9 pg (ref 26.0–34.0)
MCHC: 34.6 g/dL (ref 30.0–36.0)
MCV: 89.1 fL (ref 78.0–100.0)
Monocytes Absolute: 0.5 10*3/uL (ref 0.1–1.0)
Monocytes Relative: 9 % (ref 3–12)
NEUTROS ABS: 2.9 10*3/uL (ref 1.7–7.7)
NEUTROS PCT: 54 % (ref 43–77)
PLATELETS: 250 10*3/uL (ref 150–400)
RBC: 4.7 MIL/uL (ref 4.22–5.81)
RDW: 12.8 % (ref 11.5–15.5)
WBC: 5.3 10*3/uL (ref 4.0–10.5)

## 2013-08-10 MED ORDER — DIVALPROEX SODIUM ER 500 MG PO TB24: 1000.0000 mg | ORAL_TABLET | Freq: Every day | ORAL | Status: DC

## 2013-08-10 NOTE — Progress Notes (Signed)
I saw patient and discussed his care with Dr. Burnard Bunting at the time of the visit.  We reviewed the resident's history and exam and pertinent patient test results.  I agree with the assessment, diagnosis, and plan of care documented in the resident's note.

## 2013-08-10 NOTE — Telephone Encounter (Signed)
SPOKE WITH MR. FLACK REGARDING REFERRAL TO [facility]. PATIENT HAS NO INSURANCE. INFORMED PATIENT OFFICE WILL CALL HIM AND HE CAN DISCUSS THE COST WITH OFFICE.  Sixto Bowdish NTII 1-12-015   5:21PM

## 2013-08-10 NOTE — Assessment & Plan Note (Addendum)
Suspect patient has had another seizure.  Patient has been instructed not to drive for at least 6 months, until he is re-evaluated by a physician.  Spoke with Dr. Jannifer Franklin of [facility] - he suggested continuing depakote given that this treats all seizures (vs keppra) and since it has worked in the past.  No loading dose needed given seizure was 3 days ago.  -CBC, CMET -Restart depakote 1000mg  daily (ER, patient to called if too expensive) -Refer to neurology (patient made aware of $100-150 out of pocket cost)

## 2013-08-10 NOTE — Telephone Encounter (Signed)
Pt walked in to clinic - ? Seizure 08/07/13 PM - not checking CBG. Blacked out few minutes. Did not call EMS. Appt made 08/10/13 9AM. Hilda Blades Hanz Winterhalter RN 08/10/13 9:15AM

## 2013-08-10 NOTE — Patient Instructions (Addendum)
-  You have had another seizure - let's restart your depkote - you may take 1000mg  every night (this is the extended release version) - I have sent this to your pharmacy, if it is too expensive, please let us know -I will check some blood work today to make sure nothing else is contributing, and to have baseline labs before restarting your medication -I am also referring you to neurology -Do not drive for at least 6 months, until you are re-evaluated by a physician  Please be sure to bring all of your medications with you to every visit.  Should you have any new or worsening symptoms, please be sure to call the clinic at [phone].
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
## 2013-08-13 ENCOUNTER — Other Ambulatory Visit: Payer: Self-pay

## 2013-08-13 ENCOUNTER — Encounter (HOSPITAL_COMMUNITY): Payer: Self-pay | Admitting: Emergency Medicine

## 2013-08-13 ENCOUNTER — Emergency Department (HOSPITAL_COMMUNITY)
Admission: EM | Admit: 2013-08-13 | Discharge: 2013-08-14 | Disposition: A | Discharge status: Home / Self Care | Payer: Self-pay | Attending: Emergency Medicine | Admitting: Emergency Medicine

## 2013-08-13 DIAGNOSIS — I10 Essential (primary) hypertension: Secondary | ICD-10-CM | POA: Insufficient documentation

## 2013-08-13 DIAGNOSIS — G40909 Epilepsy, unspecified, not intractable, without status epilepticus: Secondary | ICD-10-CM | POA: Insufficient documentation

## 2013-08-13 DIAGNOSIS — Z79899 Other long term (current) drug therapy: Secondary | ICD-10-CM | POA: Insufficient documentation

## 2013-08-13 DIAGNOSIS — R569 Unspecified convulsions: Secondary | ICD-10-CM

## 2013-08-13 DIAGNOSIS — E119 Type 2 diabetes mellitus without complications: Secondary | ICD-10-CM | POA: Insufficient documentation

## 2013-08-13 LAB — CBC WITH DIFFERENTIAL/PLATELET
BASOS ABS: 0 10*3/uL (ref 0.0–0.1)
BASOS PCT: 0 % (ref 0–1)
Eosinophils Absolute: 0.1 10*3/uL (ref 0.0–0.7)
Eosinophils Relative: 2 % (ref 0–5)
HCT: 39 % (ref 39.0–52.0)
Hemoglobin: 14 g/dL (ref 13.0–17.0)
LYMPHS PCT: 17 % (ref 12–46)
Lymphs Abs: 1.3 10*3/uL (ref 0.7–4.0)
MCH: 32.2 pg (ref 26.0–34.0)
MCHC: 35.9 g/dL (ref 30.0–36.0)
MCV: 89.7 fL (ref 78.0–100.0)
Monocytes Absolute: 0.6 10*3/uL (ref 0.1–1.0)
Monocytes Relative: 7 % (ref 3–12)
NEUTROS ABS: 5.9 10*3/uL (ref 1.7–7.7)
Neutrophils Relative %: 74 % (ref 43–77)
Platelets: 194 10*3/uL (ref 150–400)
RBC: 4.35 MIL/uL (ref 4.22–5.81)
RDW: 11.5 % (ref 11.5–15.5)
WBC: 7.9 10*3/uL (ref 4.0–10.5)

## 2013-08-13 LAB — GLUCOSE, CAPILLARY: Glucose-Capillary: 168 mg/dL — ABNORMAL HIGH (ref 70–99)

## 2013-08-13 MED ORDER — VALPROATE SODIUM 500 MG/5ML IV SOLN
500.0000 mg | Freq: Once | INTRAVENOUS | Status: AC
  Administered 2013-08-13: 500 mg via INTRAVENOUS
  Filled 2013-08-13: qty 5

## 2013-08-13 MED ORDER — VALPROATE SODIUM 500 MG/5ML IV SOLN
INTRAVENOUS | Status: AC
  Filled 2013-08-13: qty 5

## 2013-08-13 NOTE — ED Notes (Addendum)
Witnessed seizure. Pt states he's been off his seizure meds for 5 years. Has had one other seizure 2 days ago and then tonight while watching tv. Positive urinary incontinence. Pt also states he saw his PMD after the previous seizure (1/12) and was rx'd with depakote but has not gotten it filled yet.

## 2013-08-13 NOTE — ED Provider Notes (Signed)
CSN: [id number]     Arrival date & time 08/13/13  2238 History  This chart was scribed for Johnna Acosta, MD by Rolanda Lundborg, ED Scribe. This patient was seen in room APA18/APA18 and the patient's care was started at 10:55 PM.    Chief Complaint  Patient presents with  . Seizures   The history is provided by the patient. No language interpreter was used.   HPI Comments: Tony [name] is a 63 y.o. male who presents to the Emergency Department complaining of seizures. He reports one episode earlier this week and one episode tonight. He states he was watching TV and the next thing he remembers everyone was standing around him. He states the seizures started when he was a teenager. He stopped taking his medications 5-6 years ago because he stopped having seizures but they started again this week. He has a prescription for his seizure meds but has not had them filled yet. Pt denies fevers, chills, nausea, vomiting. He is otherwise healthy. He denies alcohol use.  The seizure stopped by itself, had + urinary incontinence.  Past Medical History  Diagnosis Date  . Diabetes mellitus     Type II, diagnosed 2011, not on insulin; admitted in 2011 for hyperglycemia  . HTN (hypertension)   . Seizure disorder     diagnosed in childhood, last seizure was years ago   History reviewed. No pertinent past surgical history. Family History  Problem Relation Age of Onset  . Diabetes Mother   . Hypertension Mother    History  Substance Use Topics  . Smoking status: Never Smoker   . Smokeless tobacco: Never Used  . Alcohol Use: No    Review of Systems  Neurological: Positive for seizures.  All other systems reviewed and are negative.    Allergies  Review of patient's allergies indicates no known allergies.  Home Medications   Current Outpatient Rx  Name  Route  Sig  Dispense  Refill  . lisinopril-hydrochlorothiazide (PRINZIDE,ZESTORETIC) 10-12.5 MG per tablet   Oral   Take 1 tablet by mouth  daily.         . divalproex (DEPAKOTE ER) 500 MG 24 hr tablet   Oral   Take 2 tablets (1,000 mg total) by mouth daily.   60 tablet   3   . metFORMIN (GLUCOPHAGE) 1000 MG tablet   Oral   Take 1 tablet (1,000 mg total) by mouth 2 (two) times daily with a meal.   60 tablet   11    BP 166/77  Pulse 84  Temp(Src) 98.8 F (37.1 C) (Oral)  Resp 16  Ht 5\' 11"  (1.803 m)  Wt 215 lb (97.523 kg)  BMI 30.00 kg/m2  SpO2 100% Physical Exam  Nursing note and vitals reviewed. Constitutional: He is oriented to person, place, and time. He appears well-developed and well-nourished. No distress.  HENT:  Head: Normocephalic and atraumatic.  Mouth/Throat: Oropharynx is clear and moist. No oropharyngeal exudate.  Eyes: Conjunctivae are normal. Right eye exhibits no discharge. Left eye exhibits no discharge. No scleral icterus.  Neck: Neck supple. No tracheal deviation present.  Cardiovascular: Normal rate, regular rhythm and intact distal pulses.   No murmur heard. Pulmonary/Chest: Effort normal. No respiratory distress. He has no wheezes. He has no rales.  Abdominal: Soft. There is no tenderness.  Musculoskeletal: Normal range of motion. He exhibits no edema and no tenderness.  Neurological: He is alert and oriented to person, place, and time.  Neurologic exam:  Speech clear, pupils equal round reactive to light, extraocular movements intact  Normal peripheral visual fields Cranial nerves III through XII normal including no facial droop Follows commands, moves all extremities x4, normal strength to bilateral upper and lower extremities at all major muscle groups including grip Sensation normal to light touch and pinprick Coordination intact, no limb ataxia,    Skin: Skin is warm and dry.  Psychiatric: He has a normal mood and affect. His behavior is normal.    ED Course  Procedures (including critical care time) Medications  valproate (DEPACON) 500 mg in dextrose 5 % 50 mL IVPB (0  mg Intravenous Stopped 08/14/13 0108)    DIAGNOSTIC STUDIES: Oxygen Saturation is 100% on RA, normal by my interpretation.    COORDINATION OF CARE: 11:08 PM- Discussed treatment plan with pt. Pt agrees to plan.    Labs Review Labs Reviewed  COMPREHENSIVE METABOLIC PANEL - Abnormal; Notable for the following:    Glucose, Bld 183 (*)    Creatinine, Ser 1.45 (*)    GFR calc non Af Amer 50 (*)    GFR calc Af Amer 58 (*)    All other components within normal limits  GLUCOSE, CAPILLARY - Abnormal; Notable for the following:    Glucose-Capillary 168 (*)    All other components within normal limits  CBC WITH DIFFERENTIAL   Imaging Review No results found.  EKG Interpretation   None       MDM   1. Seizure    The pt has had recurrent seizures, has no signs of seizure activity at this time, he appears stable neurologically, hemodynamically and is awake and alert and following commands. Family members have health inform me of his dose of Depakote, this has been ordered, he will start taking his medication in the morning, family members pressure me that he will be able to take it. CBC without leukocytosis, normal glucose  ED ECG REPORT  I personally interpreted this EKG   Date: 08/14/2013   Rate: 86  Rhythm: normal sinus rhythm  QRS Axis: left  Intervals: normal  ST/T Wave abnormalities: nonspecific T wave changes  Conduction Disutrbances:none  Narrative Interpretation:   Old EKG Reviewed: none available  1:10 AM Lab work normal - depakote IV given, stable at this time, no recurrent seizures.  I personally performed the services described in this documentation, which was scribed in my presence. The recorded information has been reviewed and is accurate.      Johnna Acosta, MD 08/14/13 [facility]

## 2013-08-14 LAB — COMPREHENSIVE METABOLIC PANEL
ALT: 11 U/L (ref 0–53)
AST: 14 U/L (ref 0–37)
Albumin: 3.8 g/dL (ref 3.5–5.2)
Alkaline Phosphatase: 97 U/L (ref 39–117)
BUN: 12 mg/dL (ref 6–23)
CO2: 27 meq/L (ref 19–32)
Calcium: 9.4 mg/dL (ref 8.4–10.5)
Chloride: 103 mEq/L (ref 96–112)
Creatinine, Ser: 1.45 mg/dL — ABNORMAL HIGH (ref 0.50–1.35)
GFR calc Af Amer: 58 mL/min — ABNORMAL LOW (ref >90)
GFR, EST NON AFRICAN AMERICAN: 50 mL/min — AB (ref >90)
Glucose, Bld: 183 mg/dL — ABNORMAL HIGH (ref 70–99)
Potassium: 4.1 mEq/L (ref 3.7–5.3)
SODIUM: 142 meq/L (ref 137–147)
Total Bilirubin: 0.4 mg/dL (ref 0.3–1.2)
Total Protein: 7.8 g/dL (ref 6.0–8.3)

## 2013-08-14 NOTE — ED Notes (Signed)
Discharge instructions given and reviewed with patient.  Patient verbalized understanding to follow up with his PMD regarding seizure medication.  Patient ambulatory with steady gait; discharged home in good condition.

## 2013-08-14 NOTE — ED Notes (Signed)
Patient sitting up in bed talking with son.

## 2013-08-14 NOTE — ED Notes (Signed)
Patient remains A&O; skin w/d. Respirations even and unlabored; able to speak in complete sentences without difficulty.  Patient resting comfortably in bed with eyes closed; will continue to monitor.

## 2013-08-14 NOTE — Discharge Instructions (Signed)
Driving and Equipment Restrictions Some medical problems make it dangerous to drive, ride a bike, or use machines. Some of these problems are:  A hard blow to the head (concussion).  Passing out (fainting).  Twitching and shaking (seizures).  Low blood sugar.  Taking medicine to help you relax (sedatives).  Taking pain medicines.  Wearing an eye patch.  Wearing splints. This can make it hard to use parts of your body that you need to drive safely. HOME CARE   Do not drive until your doctor says it is okay.  Do not use machines until your doctor says it is okay. You may need a form signed by your doctor (medical release) before you can drive again. You may also need this form before you do other tasks where you need to be fully alert. MAKE SURE YOU:  Understand these instructions.  Will watch your condition.  Will get help right away if you are not doing well or get worse. Document Released: 08/23/2004 Document Revised: 10/08/2011 Document Reviewed: 11/23/2009 [facility] Patient Information 2014 [place].  Epilepsy People with epilepsy have times when they shake and jerk uncontrollably (seizures). This happens when there is a sudden change in brain function. Epilepsy may have many possible causes. Anything that disturbs the normal pattern of brain cell activity can lead to seizures. HOME CARE   Follow your doctor's instructions about driving and safety during normal activities.  Get enough sleep.  Only take medicine as told by your doctor.  Avoid things that you know can cause you to have seizures (triggers).  Write down when your seizures happen and what you remember about each seizure. Write down anything you think may have caused the seizure to happen.  Tell the people you live and work with that you have seizures. Make sure they know how to help you. They should:  Cushion your head and body.  Turn you on your side.  Not restrain you.  Not place anything  inside your mouth.  Call for local emergency medical help if there is any question about what has happened.  Keep all follow-up visits with your doctor. This is very important. GET HELP IF:  You get an infection or start to feel sick. You may have more seizures when you are sick.  You are having seizures more often.  Your seizure pattern is changing. GET HELP RIGHT AWAY IF:   A seizure does not stop after a few seconds or minutes.  A seizure causes you to have trouble breathing.  A seizure gives you a very bad headache.  A seizure makes you unable to speak or use a part of your body. Document Released: 05/13/2009 Document Revised: 05/06/2013 Document Reviewed: 02/25/2013 [facility] Patient Information 2014 [place].

## 2013-08-18 ENCOUNTER — Ambulatory Visit: Payer: Self-pay

## 2013-11-30 ENCOUNTER — Encounter: Payer: Self-pay | Admitting: *Deleted

## 2013-12-31 NOTE — Addendum Note (Signed)
Addended by: Hulan Fray on: 12/31/2013 09:05 PM   Modules accepted: Orders

## 2014-04-16 ENCOUNTER — Encounter: Payer: Self-pay | Admitting: Internal Medicine

## 2014-05-07 ENCOUNTER — Ambulatory Visit (INDEPENDENT_AMBULATORY_CARE_PROVIDER_SITE_OTHER): Payer: Self-pay | Admitting: Internal Medicine

## 2014-05-07 ENCOUNTER — Encounter: Payer: Self-pay | Admitting: Internal Medicine

## 2014-05-07 ENCOUNTER — Ambulatory Visit: Payer: Self-pay

## 2014-05-07 VITALS — BP 145/82 | HR 73 | Temp 100.0°F | Ht 71.0 in | Wt 227.9 lb

## 2014-05-07 DIAGNOSIS — N529 Male erectile dysfunction, unspecified: Secondary | ICD-10-CM

## 2014-05-07 DIAGNOSIS — N521 Erectile dysfunction due to diseases classified elsewhere: Secondary | ICD-10-CM | POA: Insufficient documentation

## 2014-05-07 DIAGNOSIS — I10 Essential (primary) hypertension: Secondary | ICD-10-CM

## 2014-05-07 DIAGNOSIS — E1121 Type 2 diabetes mellitus with diabetic nephropathy: Secondary | ICD-10-CM

## 2014-05-07 DIAGNOSIS — E119 Type 2 diabetes mellitus without complications: Secondary | ICD-10-CM

## 2014-05-07 LAB — CBC WITH DIFFERENTIAL/PLATELET
BASOS PCT: 0 % (ref 0–1)
Basophils Absolute: 0 10*3/uL (ref 0.0–0.1)
Eosinophils Absolute: 0.1 10*3/uL (ref 0.0–0.7)
Eosinophils Relative: 1 % (ref 0–5)
HEMATOCRIT: 42.2 % (ref 39.0–52.0)
HEMOGLOBIN: 14.3 g/dL (ref 13.0–17.0)
LYMPHS PCT: 22 % (ref 12–46)
Lymphs Abs: 1.4 10*3/uL (ref 0.7–4.0)
MCH: 31 pg (ref 26.0–34.0)
MCHC: 33.9 g/dL (ref 30.0–36.0)
MCV: 91.5 fL (ref 78.0–100.0)
MONO ABS: 0.5 10*3/uL (ref 0.1–1.0)
Monocytes Relative: 8 % (ref 3–12)
NEUTROS ABS: 4.4 10*3/uL (ref 1.7–7.7)
NEUTROS PCT: 69 % (ref 43–77)
Platelets: 224 10*3/uL (ref 150–400)
RBC: 4.61 MIL/uL (ref 4.22–5.81)
RDW: 12.8 % (ref 11.5–15.5)
WBC: 6.4 10*3/uL (ref 4.0–10.5)

## 2014-05-07 LAB — POCT GLYCOSYLATED HEMOGLOBIN (HGB A1C): Hemoglobin A1C: 6

## 2014-05-07 LAB — GLUCOSE, CAPILLARY: GLUCOSE-CAPILLARY: 186 mg/dL — AB (ref 70–99)

## 2014-05-07 MED ORDER — LISINOPRIL-HYDROCHLOROTHIAZIDE 10-12.5 MG PO TABS: 1.0000 | ORAL_TABLET | Freq: Every day | ORAL | Status: DC

## 2014-05-07 MED ORDER — SILDENAFIL CITRATE 50 MG PO TABS: 50.0000 mg | ORAL_TABLET | ORAL | Status: AC | PRN

## 2014-05-07 MED ORDER — METFORMIN HCL 1000 MG PO TABS: 1000.0000 mg | ORAL_TABLET | Freq: Every day | ORAL | Status: DC

## 2014-05-07 NOTE — Assessment & Plan Note (Signed)
Lab Results  Component Value Date   HGBA1C 6.0 05/07/2014   HGBA1C 5.9 06/19/2013   HGBA1C 6.0 08/01/2012     Assessment: Diabetes control:   Progress toward A1C goal:    Comments: at goal   Plan: Medications:  pt to continue taking metformin 1000mg  q daily Home glucose monitoring: Frequency:   Timing:   Instruction/counseling given: reminded to get eye exam, reminded to bring blood glucose meter & log to each visit, reminded to bring medications to each visit, discussed foot care, discussed the need for weight loss and discussed diet Educational resources provided: brochure Self management tools provided:   Other plans: Pt inconsistently taking metformin (5/7 days of the week) therefore will continue and pt still having some symptoms of hyperglycemia given polyuria. Will f/u in 3 weeks. Foot exam completed today. Pt denied flu shot, retinal exam and tdap vaccinations at this visit.

## 2014-05-07 NOTE — Assessment & Plan Note (Signed)
BP Readings from Last 3 Encounters:  05/07/14 145/82  08/14/13 150/71  08/10/13 145/85    Lab Results  Component Value Date   NA 142 08/13/2013   K 4.1 08/13/2013   CREATININE 1.45* 08/13/2013    Assessment: Blood pressure control:   Progress toward BP goal:    Comments: Pt reports feeling anxious today when discussing sensitive issues of ED  Plan: Medications:  continue current medications of prinzide 10-12.5mg  qd Educational resources provided: brochure Self management tools provided:   Other plans: will check Urine microalbumin

## 2014-05-07 NOTE — Patient Instructions (Addendum)
General Instructions:   Thank you for bringing your medicines today. This helps [place] keep you safe from mistakes.   Progress Toward Treatment Goals:  No flowsheet data found.  Self Care Goals & Plans:  Self Care Goal 05/07/2014  Manage my medications take my medicines as prescribed; bring my medications to every visit; refill my medications on time  Monitor my health -  Eat healthy foods drink diet soda or water instead of juice or soda; eat more vegetables; eat foods that are low in salt; eat baked foods instead of fried foods; eat fruit for snacks and desserts  Be physically active -    No flowsheet data found.   Care Management & Community Referrals:  No flowsheet data found.     Erectile Dysfunction Erectile dysfunction is the inability to get or sustain a good enough erection to have sexual intercourse. Erectile dysfunction may involve:  Inability to get an erection.  Lack of enough hardness to allow penetration.  Loss of the erection before sex is finished.  Premature ejaculation. CAUSES  Certain drugs, such as:  Pain relievers.  Antihistamines.  Antidepressants.  Blood pressure medicines.  Water pills (diuretics).  Ulcer medicines.  Muscle relaxants.  Illegal drugs.  Excessive drinking.  Psychological causes, such as:  Anxiety.  Depression.  Sadness.  Exhaustion.  Performance fear.  Stress.  Physical causes, such as:  Artery problems. This may include diabetes, smoking, liver disease, or atherosclerosis.  High blood pressure.  Hormonal problems, such as low testosterone.  Obesity.  Nerve problems. This may include back or pelvic injuries, diabetes mellitus, multiple sclerosis, or Parkinson disease. SYMPTOMS  Inability to get an erection.  Lack of enough hardness to allow penetration.  Loss of the erection before sex is finished.  Premature ejaculation.  Normal erections at some times, but with frequent unsatisfactory  episodes.  Orgasms that are not satisfactory in sensation or frequency.  Low sexual satisfaction in either partner because of erection problems.  A curved penis occurring with erection. The curve may cause pain or may be too curved to allow for intercourse.  Never having nighttime erections. DIAGNOSIS Your caregiver can often diagnose this condition by:  Performing a physical exam to find other diseases or specific problems with the penis.  Asking you detailed questions about the problem.  Performing blood tests to check for diabetes mellitus or to measure hormone levels.  Performing urine tests to find other underlying health conditions.  Performing an ultrasound exam to check for scarring.  Performing a test to check blood flow to the penis.  Doing a sleep study at home to measure nighttime erections. TREATMENT   You may be prescribed medicines by mouth.  You may be given medicine injections into the penis.  You may be prescribed a vacuum pump with a ring.  Penile implant surgery may be performed. You may receive:  An inflatable implant.  A semirigid implant.  Blood vessel surgery may be performed. HOME CARE INSTRUCTIONS  If you are prescribed oral medicine, you should take the medicine as prescribed. Do not increase the dosage without first discussing it with your physician.  If you are using self-injections, be careful to avoid any veins that are on the surface of the penis. Apply pressure to the injection site for 5 minutes.  If you are using a vacuum pump, make sure you have read the instructions before using it. Discuss any questions with your physician before taking the pump home. SEEK MEDICAL CARE IF:  You  experience pain that is not responsive to the pain medicine you have been prescribed.  You experience nausea or vomiting. SEEK IMMEDIATE MEDICAL CARE IF:   When taking oral or injectable medications, you experience an erection that lasts longer than 4  hours. If your physician is unavailable, go to the nearest emergency room for evaluation. An erection that lasts much longer than 4 hours can result in permanent damage to your penis.  You have pain that is severe.  You develop redness, severe pain, or severe swelling of your penis.  You have redness spreading up into your groin or lower abdomen.  You are unable to pass your urine. Document Released: 07/13/2000 Document Revised: 03/18/2013 Document Reviewed: 12/18/2012 [facility] Patient Information 2015 [place], [place]. This information is not intended to replace advice given to you by your health care provider. Make sure you discuss any questions you have with your health care provider.

## 2014-05-07 NOTE — Assessment & Plan Note (Signed)
Pt did have some results when taking Viagra. Doesn't seem to be hormonal etiology as pt still has normal desire, stable weight, and no fatigue.  -refill of viagra -counseled on control of HTN and DM to improve health

## 2014-05-07 NOTE — Progress Notes (Signed)
Subjective:   Patient ID: Tony [name] male   DOB: July 05, 1951 63 y.o.   MRN: [id number]  HPI: [name] is a 63 y.o. man pmh as listed below here for DM recheck.   DM - Patient checking blood sugars zero times daily. Currently taking "meds when I feel like I have too.". No hypoglycemic episodes since last visit. admits to polyuria, but no polydipsia, nausea, vomiting, diarrhea.  does not request refills today.  Pt main concern today is ED. He tried only 1/2 of a viagra pill that produced only "minimal results" as defined by the patient. He was able to obtain and erection but unable to complete penetration with his partner. He states that he was concerned and worried about taking a full pill at that time and has not tried again or taken anything OTC. He reports normal desire and morning penile tumescence. He does not report any weight loss, fatigue, decreased energy, recent fractures or urinary/penile discharge or ulcerations/lesions. The patient also denies any back pain, back injury, lower extremity swelling. 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   Past Medical History  Diagnosis Date  . Diabetes mellitus     Type II, diagnosed 26-Nov-2009, not on insulin; admitted in 11-26-09 for hyperglycemia  . HTN (hypertension)   . Seizure disorder     diagnosed in childhood, last seizure was years ago   Current Outpatient Prescriptions  Medication Sig Dispense Refill  . divalproex (DEPAKOTE ER) 500 MG 24 hr tablet Take 2 tablets (1,000 mg total) by mouth daily.  60 tablet  3  . lisinopril-hydrochlorothiazide (PRINZIDE,ZESTORETIC) 10-12.5 MG per tablet Take 1 tablet by mouth daily.      . metFORMIN (GLUCOPHAGE) 1000 MG tablet Take 1 tablet (1,000 mg total) by mouth 2 (two) times daily with a meal.  60 tablet  11   No current facility-administered medications for this visit.   Family History  Problem Relation Age of Onset  . Diabetes  Mother   . Hypertension Mother    History   Social History  . Marital Status: Married    Spouse Name: N/A    Number of Children: 2  . Years of Education: 12   Occupational History  . 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 Smoking status: Never Smoker   . Smokeless tobacco: Never Used  . Alcohol Use: No  . Drug Use: No  . Sexual Activity: No     Comment: wife passed away in August 28, 2010   Other Topics Concern  . None   Social History Narrative   Lives in [place] with son   His wife passed away in 2010/11/27    Review of Systems: Pertinent items are noted in HPI. Objective:  Physical Exam: Filed Vitals:   05/07/14 1322  BP: 145/82  Pulse: 73  Temp: 100 F (37.8 C)  TempSrc: Oral  Height: 5\' 11"  (1.803 m)  Weight: 227 lb 14.4 oz (103.375 kg)  SpO2: 99%   General: sitting in chair, NAD  HEENT: PERRL, EOMI, no scleral icterus, missing several teeth  Cardiac: RRR, no rubs, murmurs or gallops Pulm: clear to auscultation bilaterally, moving normal volumes of air Abd: soft, nontender, nondistended, BS present Ext: warm and well perfused, no pedal edema Neuro: alert and oriented X3, cranial nerves II-XII grossly intact  Assessment & Plan:  Please see  problem oriented charting  Pt discussed with Dr. Lynnae January

## 2014-05-08 LAB — MICROALBUMIN / CREATININE URINE RATIO
CREATININE, URINE: 235 mg/dL
MICROALB/CREAT RATIO: 9.8 mg/g (ref 0.0–30.0)
Microalb, Ur: 2.3 mg/dL — ABNORMAL HIGH (ref <2.0)

## 2014-05-08 LAB — COMPREHENSIVE METABOLIC PANEL
ALK PHOS: 73 U/L (ref 39–117)
ALT: 11 U/L (ref 0–53)
AST: 16 U/L (ref 0–37)
Albumin: 4.4 g/dL (ref 3.5–5.2)
BUN: 12 mg/dL (ref 6–23)
CHLORIDE: 103 meq/L (ref 96–112)
CO2: 28 mEq/L (ref 19–32)
CREATININE: 1.35 mg/dL (ref 0.50–1.35)
Calcium: 9.6 mg/dL (ref 8.4–10.5)
Glucose, Bld: 166 mg/dL — ABNORMAL HIGH (ref 70–99)
Potassium: 4.4 mEq/L (ref 3.5–5.3)
Sodium: 142 mEq/L (ref 135–145)
Total Bilirubin: 0.6 mg/dL (ref 0.2–1.2)
Total Protein: 7.7 g/dL (ref 6.0–8.3)

## 2014-05-08 LAB — LIPID PANEL
Cholesterol: 142 mg/dL (ref 0–200)
HDL: 33 mg/dL — AB (ref >39)
LDL CALC: 76 mg/dL (ref 0–99)
Total CHOL/HDL Ratio: 4.3 Ratio
Triglycerides: 164 mg/dL — ABNORMAL HIGH (ref <150)
VLDL: 33 mg/dL (ref 0–40)

## 2014-05-10 NOTE — Progress Notes (Signed)
Internal Medicine Clinic Attending  Case discussed with Dr. Sadek soon after the resident saw the patient.  We reviewed the resident's history and exam and pertinent patient test results.  I agree with the assessment, diagnosis, and plan of care documented in the resident's note. 

## 2014-05-11 ENCOUNTER — Ambulatory Visit: Payer: Self-pay

## 2014-05-21 ENCOUNTER — Ambulatory Visit: Payer: Self-pay

## 2014-10-27 ENCOUNTER — Other Ambulatory Visit: Payer: Self-pay | Admitting: *Deleted

## 2014-10-27 DIAGNOSIS — G40909 Epilepsy, unspecified, not intractable, without status epilepticus: Secondary | ICD-10-CM

## 2014-10-28 MED ORDER — DIVALPROEX SODIUM ER 500 MG PO TB24: 1000.0000 mg | ORAL_TABLET | Freq: Every day | ORAL | Status: DC

## 2015-01-17 ENCOUNTER — Emergency Department (HOSPITAL_COMMUNITY): Payer: Commercial Managed Care - HMO

## 2015-01-17 ENCOUNTER — Encounter (HOSPITAL_COMMUNITY): Payer: Self-pay | Admitting: Emergency Medicine

## 2015-01-17 ENCOUNTER — Emergency Department (HOSPITAL_COMMUNITY)
Admission: EM | Admit: 2015-01-17 | Discharge: 2015-01-17 | Disposition: A | Discharge status: Home / Self Care | Payer: Commercial Managed Care - HMO | Attending: Emergency Medicine | Admitting: Emergency Medicine

## 2015-01-17 DIAGNOSIS — R413 Other amnesia: Secondary | ICD-10-CM | POA: Diagnosis not present

## 2015-01-17 DIAGNOSIS — N63 Unspecified lump in unspecified breast: Secondary | ICD-10-CM

## 2015-01-17 DIAGNOSIS — E119 Type 2 diabetes mellitus without complications: Secondary | ICD-10-CM | POA: Diagnosis not present

## 2015-01-17 DIAGNOSIS — Z79899 Other long term (current) drug therapy: Secondary | ICD-10-CM | POA: Diagnosis not present

## 2015-01-17 DIAGNOSIS — R200 Anesthesia of skin: Secondary | ICD-10-CM | POA: Insufficient documentation

## 2015-01-17 DIAGNOSIS — G40909 Epilepsy, unspecified, not intractable, without status epilepticus: Secondary | ICD-10-CM | POA: Diagnosis not present

## 2015-01-17 DIAGNOSIS — I10 Essential (primary) hypertension: Secondary | ICD-10-CM | POA: Insufficient documentation

## 2015-01-17 DIAGNOSIS — R531 Weakness: Secondary | ICD-10-CM | POA: Diagnosis not present

## 2015-01-17 LAB — COMPREHENSIVE METABOLIC PANEL
ALK PHOS: 71 U/L (ref 38–126)
ALT: 13 U/L — AB (ref 17–63)
AST: 18 U/L (ref 15–41)
Albumin: 4.1 g/dL (ref 3.5–5.0)
Anion gap: 8 (ref 5–15)
BILIRUBIN TOTAL: 0.7 mg/dL (ref 0.3–1.2)
BUN: 10 mg/dL (ref 6–20)
CHLORIDE: 100 mmol/L — AB (ref 101–111)
CO2: 30 mmol/L (ref 22–32)
Calcium: 9.4 mg/dL (ref 8.9–10.3)
Creatinine, Ser: 1.48 mg/dL — ABNORMAL HIGH (ref 0.61–1.24)
GFR, EST AFRICAN AMERICAN: 56 mL/min — AB (ref >60)
GFR, EST NON AFRICAN AMERICAN: 48 mL/min — AB (ref >60)
Glucose, Bld: 116 mg/dL — ABNORMAL HIGH (ref 65–99)
POTASSIUM: 3.9 mmol/L (ref 3.5–5.1)
SODIUM: 138 mmol/L (ref 135–145)
Total Protein: 8.3 g/dL — ABNORMAL HIGH (ref 6.5–8.1)

## 2015-01-17 LAB — CBC
HEMATOCRIT: 42.4 % (ref 39.0–52.0)
Hemoglobin: 14.4 g/dL (ref 13.0–17.0)
MCH: 30.8 pg (ref 26.0–34.0)
MCHC: 34 g/dL (ref 30.0–36.0)
MCV: 90.6 fL (ref 78.0–100.0)
Platelets: 186 10*3/uL (ref 150–400)
RBC: 4.68 MIL/uL (ref 4.22–5.81)
RDW: 11.6 % (ref 11.5–15.5)
WBC: 5.3 10*3/uL (ref 4.0–10.5)

## 2015-01-17 NOTE — ED Provider Notes (Signed)
CSN: [id number]     Arrival date & time 01/17/15  1046 History  This chart was scribed for Noemi Chapel, MD by Rayna Sexton, ED scribe. This patient was seen in room APA12/APA12 and the patient's care was started at 11:36 AM.    Chief Complaint  Patient presents with  . Numbness    The history is provided by the patient. No language interpreter was used.   HPI Comments: Tony [name] is a 64 y.o. male, with a history of DM and HTN, who presents to the Emergency Department complaining of intermittent, numbness in his arms bilaterally with onset 1 month ago. Pt denies pain to his arms but notes that they feel "light" and that the numbness is always a similar feeling and transfers between arms irregularly and isn't focused to just 1 side. He notes it can begin after sitting for a long period of time or while lying in bed. He further notes swelling on the left side of his chest with onset 1 week ago and is unsure of its association to the numbness. He denies any issues using his arms or generally ambulating. He denies any other associated symptoms.   PCP: Dr. Iona Beard   Past Medical History  Diagnosis Date  . Diabetes mellitus     Type II, diagnosed 2011, not on insulin; admitted in 2011 for hyperglycemia  . HTN (hypertension)   . Seizure disorder     diagnosed in childhood, last seizure was years ago   History reviewed. No pertinent past surgical history. Family History  Problem Relation Age of Onset  . Diabetes Mother   . Hypertension Mother    History  Substance Use Topics  . Smoking status: Never Smoker   . Smokeless tobacco: Never Used  . Alcohol Use: No    Review of Systems  Respiratory: Negative for shortness of breath.   Cardiovascular: Negative for chest pain.  Musculoskeletal: Negative for myalgias.  Neurological: Positive for weakness and numbness.      Allergies  Review of patient's allergies indicates no known allergies.  Home Medications   Prior to  Admission medications   Medication Sig Start Date End Date Taking? Authorizing Provider  Carbamide Peroxide (EAR WAX REMOVER OT) Place 5 drops into the right ear daily as needed (pain).   Yes Historical Provider, MD  divalproex (DEPAKOTE ER) 500 MG 24 hr tablet Take 2 tablets (1,000 mg total) by mouth daily. 10/28/14  Yes Jerrye Noble, MD  lisinopril-hydrochlorothiazide (PRINZIDE,ZESTORETIC) 10-12.5 MG per tablet Take 1 tablet by mouth daily. 05/07/14  Yes Jerrye Noble, MD  metFORMIN (GLUCOPHAGE) 1000 MG tablet Take 1 tablet (1,000 mg total) by mouth daily with breakfast. 05/07/14  Yes Jerrye Noble, MD  sildenafil (VIAGRA) 50 MG tablet Take 1 tablet (50 mg total) by mouth as needed for erectile dysfunction. 05/07/14 05/07/15 Yes Jerrye Noble, MD   BP 159/95 mmHg  Pulse 62  Temp(Src) 98.5 F (36.9 C) (Oral)  Resp 12  Ht 5\' 11"  (1.803 m)  Wt 220 lb (99.791 kg)  BMI 30.70 kg/m2  SpO2 100% Physical Exam  Constitutional: He appears well-developed and well-nourished. No distress.  HENT:  Head: Normocephalic and atraumatic.  Mouth/Throat: Oropharynx is clear and moist. No oropharyngeal exudate.  Eyes: Conjunctivae and EOM are normal. Pupils are equal, round, and reactive to light. Right eye exhibits no discharge. Left eye exhibits no discharge. No scleral icterus.  Neck: Normal range of motion. Neck supple. No JVD present. No  thyromegaly present.  Cardiovascular: Normal rate, regular rhythm, normal heart sounds and intact distal pulses.  Exam reveals no gallop and no friction rub.   No murmur heard. No carotid bruit  Pulmonary/Chest: Effort normal and breath sounds normal. No respiratory distress. He has no wheezes. He has no rales.  L breast gynecomastia preasent, no palpable discrete mass.  Abdominal: Soft. Bowel sounds are normal. He exhibits no distension and no mass. There is no tenderness.  Musculoskeletal: Normal range of motion. He exhibits no edema or tenderness.  Normal compartments and  joints - no deformity, normal ROM  Lymphadenopathy:    He has no cervical adenopathy.  Neurological: He is alert. Coordination normal.  Neurologic exam:  Speech clear, pupils equal round reactive to light, extraocular movements intact  Normal peripheral visual fields Cranial nerves III through XII normal including no facial droop Follows commands, moves all extremities x4, normal strength to bilateral upper and lower extremities at all major muscle groups including grip Sensation normal to light touch and pinprick Coordination intact, no limb ataxia, finger-nose-finger normal Rapid alternating movements normal No pronator drift Gait normal   Skin: Skin is warm and dry. No rash noted. No erythema.  Psychiatric: He has a normal mood and affect. His behavior is normal.  Nursing note and vitals reviewed.   ED Course  Procedures  DIAGNOSTIC STUDIES: Oxygen Saturation is 100% on RA, normal by my interpretation.    COORDINATION OF CARE: 11:41 AM Discussed treatment plan with pt at bedside and pt agreed to plan.  Labs Review Labs Reviewed  COMPREHENSIVE METABOLIC PANEL - Abnormal; Notable for the following:    Chloride 100 (*)    Glucose, Bld 116 (*)    Creatinine, Ser 1.48 (*)    Total Protein 8.3 (*)    ALT 13 (*)    GFR calc non Af Amer 48 (*)    GFR calc Af Amer 56 (*)    All other components within normal limits  CBC    Imaging Review Ct Head Wo Contrast  01/17/2015   CLINICAL DATA:  Left arm numbness for 4 days. Memory loss. Right ear pain for 2 days.  EXAM: CT HEAD WITHOUT CONTRAST  TECHNIQUE: Contiguous axial images were obtained from the base of the skull through the vertex without intravenous contrast.  COMPARISON:  None.  FINDINGS: 10 mm well-defined hypodensity in the right cerebellum is consistent with a small, chronic infarct. There are 1-2 subcentimeter foci of low density in the left cerebellum, slightly less discrete than that in the right hemisphere. Ventricles  and sulci are within normal limits for age. There is no evidence of acute large territory infarct, intracranial hemorrhage, mass, midline shift, or extra-axial fluid collection.  Orbits are unremarkable. Prominent calcification is noted along the anterior falx. The visualized paranasal sinuses and mastoid air cells are clear.  IMPRESSION: 1. No acute intracranial hemorrhage. 2. Small, chronic right cerebellar infarct. Likely 1 or 2 small cerebellar infarcts of indeterminate age.   Electronically Signed   By: Logan Bores   On: 01/17/2015 12:32     EKG Interpretation   Date/Time:  Monday January 17 2015 10:56:49 EDT Ventricular Rate:  68 PR Interval:  132 QRS Duration: 83 QT Interval:  383 QTC Calculation: 407 R Axis:   3 Text Interpretation:  Sinus rhythm Left anterior fasicular block no other  acute findings since last tracing no significant change Confirmed by  Drevin Ortner  MD, Danniell Rotundo ([id number]) on 01/17/2015 11:04:30 AM  MDM   Final diagnoses:  Numbness of arm  Breast swelling    Neuro exam is normal - no acute findings -   The patient has normal CT scan for today though it does show several old infarcts, there is nothing acute. His blood work is unremarkable, he has been informed of these results, he has also been encouraged strongly to follow-up this week for mammogram or further evaluation by his primary doctor, he has been given a copy of his results, he is stable for discharge.  Meds given in ED:  Medications - No data to display  New Prescriptions   No medications on file    I personally performed the services described in this documentation, which was scribed in my presence. The recorded information has been reviewed and is accurate.    Noemi Chapel, MD 01/17/15 [phone]

## 2015-01-17 NOTE — Discharge Instructions (Signed)
Please call your doctor for a followup appointment within 24-48 hours. When you talk to your doctor please let them know that you were seen in the emergency department and have them acquire all of your records so that they can discuss the findings with you and formulate a treatment plan to fully care for your new and ongoing problems. ° °

## 2015-01-17 NOTE — ED Notes (Signed)
Patient with no complaints at this time. Respirations even and unlabored. Skin warm/dry. Discharge instructions reviewed with patient at this time. Patient given opportunity to voice concerns/ask questions. IV removed per policy and band-aid applied to site. Patient discharged at this time and left Emergency Department with steady gait.  

## 2015-01-17 NOTE — ED Notes (Addendum)
PT c/o numbness to LUE intermittently x4 days. PT denies any CP or SOB. No weakness noted to upper extremities but pt c/o decreased sensation to left arm. PT also c/o right ear pain x2 days.

## 2015-03-09 ENCOUNTER — Other Ambulatory Visit: Payer: Self-pay | Admitting: Internal Medicine

## 2015-03-14 ENCOUNTER — Telehealth: Payer: Self-pay | Admitting: Internal Medicine

## 2015-03-14 NOTE — Telephone Encounter (Signed)
Pt called requesting bp med to be filled.

## 2015-03-15 NOTE — Telephone Encounter (Signed)
Rx sent to pharmacy 03/14/15 -  Message left ID recording.

## 2015-03-21 ENCOUNTER — Encounter: Payer: Commercial Managed Care - HMO | Admitting: Internal Medicine

## 2015-04-25 ENCOUNTER — Ambulatory Visit (INDEPENDENT_AMBULATORY_CARE_PROVIDER_SITE_OTHER): Payer: Commercial Managed Care - HMO | Admitting: Internal Medicine

## 2015-04-25 ENCOUNTER — Encounter: Payer: Self-pay | Admitting: Internal Medicine

## 2015-04-25 VITALS — BP 118/70 | HR 66 | Temp 98.3°F | Wt 219.3 lb

## 2015-04-25 DIAGNOSIS — N62 Hypertrophy of breast: Secondary | ICD-10-CM

## 2015-04-25 DIAGNOSIS — E119 Type 2 diabetes mellitus without complications: Secondary | ICD-10-CM

## 2015-04-25 DIAGNOSIS — Z0000 Encounter for general adult medical examination without abnormal findings: Secondary | ICD-10-CM

## 2015-04-25 DIAGNOSIS — G40909 Epilepsy, unspecified, not intractable, without status epilepticus: Secondary | ICD-10-CM

## 2015-04-25 DIAGNOSIS — I10 Essential (primary) hypertension: Secondary | ICD-10-CM

## 2015-04-25 DIAGNOSIS — Z79899 Other long term (current) drug therapy: Secondary | ICD-10-CM

## 2015-04-25 LAB — POCT GLYCOSYLATED HEMOGLOBIN (HGB A1C): HEMOGLOBIN A1C: 6

## 2015-04-25 LAB — GLUCOSE, CAPILLARY: GLUCOSE-CAPILLARY: 117 mg/dL — AB (ref 65–99)

## 2015-04-25 MED ORDER — LOVASTATIN 40 MG PO TABS: 40.0000 mg | ORAL_TABLET | Freq: Every day | ORAL | Status: DC

## 2015-04-25 NOTE — Assessment & Plan Note (Signed)
His diabetes is very well-controlled as his A1C was 6.0 today on metformin 100mg  daily. He is exercising by walking from his trailer to his mailbox daily and agreed to do some extra laps to shoot for 30 minutes per day for 5 days per week. I calculated his ASCVD risk today and it was 40% in the next year; thus, I started him on lovastatin 40mg  daily which is on the $4 [facility] list.

## 2015-04-25 NOTE — Assessment & Plan Note (Signed)
He has not had a seizure since re-starting his valproate last Winter. He's doing well with the medication and denies any overt side effects since re-starting it.

## 2015-04-25 NOTE — Progress Notes (Signed)
Patient ID: Tony [name], male   DOB: [birth year], 64 y.o.   MRN: [id number]   Subjective:   Patient ID: Tony [name] male   DOB: 1951-02-24 64 y.o.   MRN: [id number]  HPI: Tony [name] is here for routine follow-up. Please see the A&P for the status of the pt's chronic medical problem.  He says that his left breast has been larger than the right for the last year or so. He can't remember if it got larger when he started taking his Depakote back in the Winter. He also has erectile dysfunction. He denies any enlarged lymph nodes elsewhere, history of breast cancer in the family, breast discharge, or pain.  Otherwise, he has no complaints and is doing quite well.   Past Medical History  Diagnosis Date  . Diabetes mellitus     Type II, diagnosed November 24, 2009, not on insulin; admitted in 11/24/09 for hyperglycemia  . HTN (hypertension)   . Seizure disorder     diagnosed in childhood, last seizure was years ago   Current Outpatient Prescriptions  Medication Sig Dispense Refill  . Carbamide Peroxide (EAR WAX REMOVER OT) Place 5 drops into the right ear daily as needed (pain).    Marland Kitchen divalproex (DEPAKOTE ER) 500 MG 24 hr tablet Take 2 tablets (1,000 mg total) by mouth daily. 60 tablet 3  . lisinopril-hydrochlorothiazide (PRINZIDE,ZESTORETIC) 10-12.5 MG per tablet Take 1 tablet by mouth daily. 90 tablet 3  . lovastatin (MEVACOR) 40 MG tablet Take 1 tablet (40 mg total) by mouth daily. 30 tablet 11  . metFORMIN (GLUCOPHAGE) 1000 MG tablet Take 1 tablet (1,000 mg total) by mouth daily with breakfast. 30 tablet 11  . sildenafil (VIAGRA) 50 MG tablet Take 1 tablet (50 mg total) by mouth as needed for erectile dysfunction. 20 tablet 1   No current facility-administered medications for this visit.   Family History  Problem Relation Age of Onset  . Diabetes Mother   . Hypertension Mother    Social History   Social History  . Marital Status: Married    Spouse Name: N/A  . Number of Children: 2  . 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 Smoking status: Never Smoker   . Smokeless tobacco: Never Used  . Alcohol Use: No  . Drug Use: No  . Sexual Activity: No     Comment: wife passed away in Aug 26, 2010   Other Topics Concern  . Not on file   Social History Narrative   Lives in [place] with son   His wife passed away in 11-25-2010    Review of Systems  Constitutional: Negative for fever, chills, weight loss and malaise/fatigue.  Eyes: Negative for blurred vision.  Respiratory: Negative for cough and shortness of breath.   Cardiovascular: Negative for chest pain, palpitations and orthopnea.  Gastrointestinal: Negative for nausea, vomiting, abdominal pain and diarrhea.  Musculoskeletal: Negative for myalgias.  Skin: Negative for rash.  Neurological: Negative for dizziness, tingling, seizures and headaches.  Psychiatric/Behavioral: Negative for depression, suicidal ideas and substance abuse.     Objective:  Physical Exam: Filed Vitals:   04/25/15 1325 04/25/15 1418  BP: 163/79 118/70  Pulse: 66 66  Temp: 98.3 F (36.8 C)   TempSrc: Oral   Weight: 219 lb 4.8 oz (99.474 kg)   SpO2: 100%    Physical Exam  Constitutional: He appears  well-developed and well-nourished.  HENT:  Head: Normocephalic and atraumatic.  Eyes: Conjunctivae and EOM are normal.  Neck: Normal range of motion. Neck supple.  Cardiovascular: Normal rate, regular rhythm, normal heart sounds and intact distal pulses.   Pulmonary/Chest: Effort normal and breath sounds normal.  Abdominal: Soft. Bowel sounds are normal.  Musculoskeletal: Normal range of motion.  Skin: Skin is warm and dry. No rash noted.  Psychiatric: He has a normal mood and affect. His behavior is normal.     Assessment & Plan:  Please see problem-based charting.

## 2015-04-25 NOTE — Assessment & Plan Note (Signed)
He is due for colonoscopy and pneumovax but declined these today because he is concerned about the costs. At the next visit, I'll discuss stool cards as an option for colorectal cancer screening and re-address getting his pneumovax.

## 2015-04-25 NOTE — Assessment & Plan Note (Signed)
His blood pressure continues to be very well-controlled; 118/70 today on lisinopril 10mg  and HCTZ 12.5mg  daily. Given his elevated creatinine of 1.45 at his ED visit back in May, I'll re-check a BMP today. If his creatinine remains elevated, I will consider discontinuing HCTZ and starting amlodipine.

## 2015-04-25 NOTE — Assessment & Plan Note (Addendum)
He has non-tender, generalized, unilateral left-sided gynecomastia for the past year or so which I suspect is either hormonally-related, cancerous, or related to his valproate. Low testosterone is a consideration given his concomitant erectile dysfunction. Alternatively, this could potentially be cancer because it is unilateral however I could not feel a discrete tumor nor lymphadenopathy on exam. Valproate has not been reported to cause gynecomastia but it can be associated with liver dysfunction. He does not have any cirrhotic stigmata on exam today but ordering a CMP is a consideration for the future.  Moreover, I will further evaluate with a TSH and a morning testosterone level; if his testosterone is low, I will repeat another one to confirm and consider LH as well. I've also referred him for a mammogram and ultrasound of the left breast to evaluate for cancer. I will continue the valproate because he has had seizures when off of this medication.

## 2015-04-25 NOTE — Patient Instructions (Addendum)
Mr. Valda Lamb,  It was a pleasure meeting you today. I greatly enjoyed your wonderful singing. That made my day!  We talked about a few things today:  1) I'm adding a new medicine called "Lovastatin" that will help keep your cholesterol levels down and prevent heart attacks and strokes. If you start feeling muscle pains or aches on this medicine, let me know and I will switch you to a different one. This is on the $4 list at [facility].  2) The dental clinic phone number is [phone]. Give them a call; they have highly discounted rates and can help you get those teeth fixed to help you sing.  3) Your large left breast could be a couple of things. Please come back to the lab one morning in the next month to check your Testosterone level. If this is low, we can give you testosterone levels. Although I don't think this is a tumor, it is a good idea to get a mammogram. I've referred you for a mammogram; they'll give you a call to talk about the cost and scheduling. I highly recommend getting it done soon.  4) Your blood sugars and high blood pressure are VERY well-controlled. Keep up the good work and try to get some exercise by walking to your mailbox; we recommend 30 minutes per day for 5 days a week. You can do it!  It was a pleasure meeting you and hearing you sing! Dr. Melburn Hake

## 2015-04-25 NOTE — Progress Notes (Signed)
Internal Medicine Clinic Attending  64 year old male with seizure disorder, hypertension, DM2, CKD, ED coming in with complaint of left sided gynecomastia unilateral, of unknown duration, non-tender, unsure if progressive, with no masses felt on exam and no complaint of discharge.   Unilateral Non-tender Gynecomastia - DDx antiseizure meds, low T, breast cancer, hyperthyroidism, benign. We will begin with a mammogram, measurement of Testosterone levels and TSH (although patient has no symptoms suggestive of hyperthyroidism)  CKD - BMP today especially because patient is on ACE-diuretic. If increasing creatinine, would dc this and consider Amlodipine 10.   Hypertension - Initial measurement is elevated. Recheck BP.   Case seen, and examined and plan discussed with Dr Melburn Hake. Madilyn Fireman MD MPH 04/25/2015 2:33 PM

## 2015-04-26 LAB — BMP8+ANION GAP
Anion Gap: 15 mmol/L (ref 10.0–18.0)
BUN / CREAT RATIO: 8 — AB (ref 10–22)
BUN: 12 mg/dL (ref 8–27)
CALCIUM: 9.3 mg/dL (ref 8.6–10.2)
CHLORIDE: 101 mmol/L (ref 97–108)
CO2: 28 mmol/L (ref 18–29)
CREATININE: 1.45 mg/dL — AB (ref 0.76–1.27)
GFR calc non Af Amer: 50 mL/min/{1.73_m2} — ABNORMAL LOW (ref >59)
GFR, EST AFRICAN AMERICAN: 58 mL/min/{1.73_m2} — AB (ref >59)
GLUCOSE: 96 mg/dL (ref 65–99)
Potassium: 3.8 mmol/L (ref 3.5–5.2)
Sodium: 144 mmol/L (ref 134–144)

## 2015-04-26 LAB — TSH: TSH: 1.12 u[IU]/mL (ref 0.450–4.500)

## 2015-05-03 ENCOUNTER — Inpatient Hospital Stay: Admission: RE | Admit: 2015-05-03 | Payer: Commercial Managed Care - HMO | Source: Ambulatory Visit

## 2015-05-16 NOTE — Progress Notes (Signed)
I saw and evaluated the patient.  I personally confirmed the key portions of the history and exam documented by Dr. Melburn Hake and I reviewed pertinent patient test results.  The assessment, diagnosis, and plan were formulated together and I agree with the documentation in the resident's note.

## 2015-05-23 ENCOUNTER — Ambulatory Visit: Payer: Commercial Managed Care - HMO | Admitting: Internal Medicine

## 2015-06-17 ENCOUNTER — Other Ambulatory Visit (INDEPENDENT_AMBULATORY_CARE_PROVIDER_SITE_OTHER): Payer: Commercial Managed Care - HMO

## 2015-06-17 DIAGNOSIS — N62 Hypertrophy of breast: Secondary | ICD-10-CM

## 2015-06-18 LAB — TESTOSTERONE: TESTOSTERONE: 211 ng/dL — AB (ref 348–1197)

## 2015-08-03 ENCOUNTER — Other Ambulatory Visit: Payer: Self-pay | Admitting: Internal Medicine

## 2015-10-01 ENCOUNTER — Other Ambulatory Visit: Payer: Self-pay | Admitting: Internal Medicine

## 2015-10-05 ENCOUNTER — Other Ambulatory Visit: Payer: Self-pay | Admitting: Internal Medicine

## 2015-10-05 NOTE — Telephone Encounter (Signed)
Pt must be seen IMMEDIATELY

## 2015-10-06 ENCOUNTER — Encounter: Payer: Self-pay | Admitting: Internal Medicine

## 2015-10-06 NOTE — Telephone Encounter (Signed)
Attempted to contact patient today about getting an appt scheduled, but no answer.  Left msg asking to please give me a call back asap.  I will send a letter.

## 2015-10-25 ENCOUNTER — Encounter: Payer: Self-pay | Admitting: Internal Medicine

## 2015-10-25 ENCOUNTER — Ambulatory Visit (INDEPENDENT_AMBULATORY_CARE_PROVIDER_SITE_OTHER): Payer: Commercial Managed Care - HMO | Admitting: Internal Medicine

## 2015-10-25 VITALS — BP 142/92 | HR 68 | Temp 98.6°F | Ht 71.0 in | Wt 218.5 lb

## 2015-10-25 DIAGNOSIS — E119 Type 2 diabetes mellitus without complications: Secondary | ICD-10-CM

## 2015-10-25 DIAGNOSIS — I10 Essential (primary) hypertension: Secondary | ICD-10-CM | POA: Diagnosis not present

## 2015-10-25 DIAGNOSIS — Z7984 Long term (current) use of oral hypoglycemic drugs: Secondary | ICD-10-CM | POA: Diagnosis not present

## 2015-10-25 DIAGNOSIS — Z79899 Other long term (current) drug therapy: Secondary | ICD-10-CM

## 2015-10-25 LAB — POCT GLYCOSYLATED HEMOGLOBIN (HGB A1C): Hemoglobin A1C: 5.7

## 2015-10-25 LAB — GLUCOSE, CAPILLARY: Glucose-Capillary: 153 mg/dL — ABNORMAL HIGH (ref 65–99)

## 2015-10-25 MED ORDER — LISINOPRIL-HYDROCHLOROTHIAZIDE 10-12.5 MG PO TABS: 1.0000 | ORAL_TABLET | Freq: Every day | ORAL | Status: DC

## 2015-10-25 NOTE — Assessment & Plan Note (Signed)
Diabetes is very well controlled, max hgba1c in chart was 6.1, today 5.7. I told him he does not even technically need any treatment at this point but he prefers to continue his metformin.  - continue metformin 1000mg  daily.

## 2015-10-25 NOTE — Progress Notes (Signed)
   Subjective:    Patient ID: Tony [name], male    DOB: 1951/01/25, 65 y.o.   MRN: [id number]  HPI  65 yo male with hx of HTN, DM II well controlle, seizure, gynecomastia, here for follow up of DM II, HTN, and for refill.  DM II - currently on metformin 1000mg  once daily. As far as I can see, his highest hgba1c was 6.1.  Currently he is 5.7.   HTN - on lisinopril-hctz 10-12.5mg  daily. BP slightly high today but close to goal.   HLD with DM II - on lovastatin 40mg  daily based on ASCVD risk of 40% per PCP. Was started on 03/2015.   Doing well, no complaints.   Review of Systems  Constitutional: Negative for chills and fatigue.  HENT: Negative for congestion and sore throat.   Eyes: Negative for photophobia and visual disturbance.  Respiratory: Negative for cough, chest tightness and shortness of breath.   Cardiovascular: Negative for chest pain, palpitations and leg swelling.  Gastrointestinal: Negative for abdominal pain and abdominal distention.  Genitourinary: Negative for dysuria and flank pain.  Musculoskeletal: Negative for back pain and arthralgias.  Skin: Negative.   Allergic/Immunologic: Negative.   Neurological: Negative for dizziness, facial asymmetry and headaches.  Psychiatric/Behavioral: Negative.        Objective:   Physical Exam  Constitutional: He appears well-developed and well-nourished. No distress.  Overweight male. Pleasant.   HENT:  Head: Normocephalic and atraumatic.  Mouth/Throat: Oropharynx is clear and moist.  Eyes: Conjunctivae are normal. Pupils are equal, round, and reactive to light. Right eye exhibits no discharge. Left eye exhibits no discharge.  Neck: Normal range of motion.  Cardiovascular: Normal rate and regular rhythm.  Exam reveals no gallop and no friction rub.   No murmur heard. Pulmonary/Chest: Effort normal and breath sounds normal. No respiratory distress. He has no wheezes.  Abdominal: Soft. Bowel sounds are normal. He exhibits no  distension. There is no tenderness.  Musculoskeletal: Normal range of motion. He exhibits no edema or tenderness.  Neurological: He is alert.  Skin: Skin is warm. He is not diaphoretic.  Psychiatric: He has a normal mood and affect.    Filed Vitals:   10/25/15 0849  BP: 142/92  Pulse: 68  Temp: 98.6 F (37 C)         Assessment & Plan:  See problem based a&p.

## 2015-10-25 NOTE — Assessment & Plan Note (Signed)
Filed Vitals:   10/25/15 0849  BP: 142/92  Pulse: 68  Temp: 98.6 F (37 C)   BP overall well controlled, slightly above goal. On lisinopril-hctz 10-12.5mg  daily. No side effects. -cont current regimen.

## 2015-10-25 NOTE — Patient Instructions (Signed)
You are doing great. Try to get more exercise, avoid sugary foods and drinks.  Follow up in 3 months.

## 2015-10-26 NOTE — Progress Notes (Signed)
Internal Medicine Clinic Attending  Case discussed with Dr. Ahmed at the time of the visit.  We reviewed the resident's history and exam and pertinent patient test results.  I agree with the assessment, diagnosis, and plan of care documented in the resident's note. 

## 2015-11-01 ENCOUNTER — Other Ambulatory Visit: Payer: Self-pay | Admitting: Internal Medicine

## 2015-11-02 ENCOUNTER — Other Ambulatory Visit: Payer: Self-pay | Admitting: Internal Medicine

## 2015-11-02 NOTE — Telephone Encounter (Signed)
Just written 3/28

## 2015-11-07 ENCOUNTER — Other Ambulatory Visit: Payer: Self-pay | Admitting: Internal Medicine

## 2015-12-24 ENCOUNTER — Other Ambulatory Visit: Payer: Self-pay | Admitting: Internal Medicine

## 2015-12-28 ENCOUNTER — Other Ambulatory Visit: Payer: Self-pay | Admitting: Internal Medicine

## 2015-12-28 DIAGNOSIS — I10 Essential (primary) hypertension: Secondary | ICD-10-CM

## 2015-12-28 NOTE — Telephone Encounter (Signed)
Called pharm pt picked up 30 day supplies of BP med on 4/10, 4/30, 5/13

## 2015-12-28 NOTE — Telephone Encounter (Signed)
NEEDS REFILL ON B/P MEDS, [facility] [phone]

## 2015-12-29 MED ORDER — LISINOPRIL-HYDROCHLOROTHIAZIDE 10-12.5 MG PO TABS: 1.0000 | ORAL_TABLET | Freq: Every day | ORAL | Status: DC

## 2015-12-29 NOTE — Telephone Encounter (Signed)
Called pt with information from pharmacy, pt states he takes 1 of his pills everyday and his son takes 1, he would like the doctor to give him enough for both of them, i explained that his son would need to be seen by a physician and prescribed medication, that it is very possible that he could be seen here in clinic. He states he did not know that they could not do that and he is sorry but he needs some medicine because he has been without for a few days. Please advise

## 2015-12-30 ENCOUNTER — Other Ambulatory Visit: Payer: Self-pay | Admitting: Internal Medicine

## 2016-01-16 ENCOUNTER — Encounter: Payer: Self-pay | Admitting: Internal Medicine

## 2016-01-16 ENCOUNTER — Encounter: Payer: Commercial Managed Care - HMO | Admitting: Internal Medicine

## 2016-01-17 ENCOUNTER — Encounter: Payer: Self-pay | Admitting: *Deleted

## 2016-02-14 ENCOUNTER — Other Ambulatory Visit: Payer: Self-pay | Admitting: Internal Medicine

## 2016-02-14 NOTE — Telephone Encounter (Signed)
Pt has already picked up at [facility]

## 2016-02-14 NOTE — Telephone Encounter (Signed)
lisinopril-hydrochlorothiazide (PRINZIDE,ZESTORETIC) 10-12.5 MG tablet [facility].

## 2016-02-15 ENCOUNTER — Ambulatory Visit: Payer: Commercial Managed Care - HMO

## 2016-02-29 ENCOUNTER — Other Ambulatory Visit: Payer: Self-pay | Admitting: Internal Medicine

## 2016-03-16 ENCOUNTER — Telehealth: Payer: Self-pay | Admitting: Internal Medicine

## 2016-03-16 NOTE — Telephone Encounter (Signed)
APT. REMINDER CALL, LMTCB °

## 2016-03-19 ENCOUNTER — Encounter: Payer: Commercial Managed Care - HMO | Admitting: Internal Medicine

## 2016-03-19 ENCOUNTER — Encounter: Payer: Self-pay | Admitting: Internal Medicine

## 2016-03-19 DIAGNOSIS — N189 Chronic kidney disease, unspecified: Secondary | ICD-10-CM | POA: Insufficient documentation

## 2016-08-21 ENCOUNTER — Other Ambulatory Visit: Payer: Self-pay | Admitting: Internal Medicine

## 2016-08-21 DIAGNOSIS — I10 Essential (primary) hypertension: Secondary | ICD-10-CM

## 2016-08-21 NOTE — Telephone Encounter (Signed)
appt 09/03/16 w/pcp

## 2016-09-02 DIAGNOSIS — E785 Hyperlipidemia, unspecified: Secondary | ICD-10-CM | POA: Insufficient documentation

## 2016-09-02 NOTE — Assessment & Plan Note (Addendum)
Patient was started on lovastatin in 03/2011 for an ASCVD risk of 40%. Patient has been out of this medication for the past year. Given his high risk, I will switch him to Crestor 20 mg daily.  -- Prescribed Crestor 20 mg daily

## 2016-09-02 NOTE — Assessment & Plan Note (Addendum)
Patient reports compliance with his lisinopril-hctz 10-12.5mg  daily. BP is well controlled, 134/74 today. Patient has CKD with baseline creatinine ~ 1.4. I will recheck a BMP today. If renal function is stable we will continue his current regimen. If renal function is declining, I would favor changing to amlodipine.  -- Checking BMP -- Continue Lisinopril 10-12.5 mg daily for now   ADDENDUM: BMP resulted with only slight increase in creatinine from 1.45 -> 1.65. GFR 58 -> 51. Given this mild decrease in function I will continue the [place]. Recheck BMP at next visit. Called patient to update him with results.

## 2016-09-02 NOTE — Assessment & Plan Note (Deleted)
Patient was due for colonoscopy in sept 2016 but declined due to cost at that time.

## 2016-09-02 NOTE — Assessment & Plan Note (Addendum)
He has been very well controlled in the past with a max A1C documented as 6.1. It was 5.7 at his last office visit and his metformin was discontinued. Today his A1C is 5.9. Patient's weight is up 9 lbs since his last visit and his BMI today is 31.8. We discussed the importance of weight loss and exercise in order to remain off diabetic medications. Patient expressed understanding. He will work on lifestyle modifications for his next visit.  -- Encouraged diet and exercise

## 2016-09-02 NOTE — Progress Notes (Signed)
   CC: HTN and DM follow up   HPI:  [name] is a 66 yo male with hx of HTN, DM II, and seizures well controlled on Depakote here for routine follow up. Patient is doing well today. He reports compliance with his medications except for rosuvastatin which he ran out of almost a year ago. Weight is up 9lbs since his last visit. Patient says he has been exercising less with the cold weather. He is complaining today about decreased sexual libido. He was previously prescribed viagra which he said helped somewhat but was very expensive for him. He also has persistent left sided gynecomastia that was partially work up in the past. He denies any appreciable masses but complains of diffuse soft tissue swelling that waxes and wanes in size. It is not painful and he denies any nipple discharge. He was referred for a mammogram at a prior visit but he never completed the testing.   Past Medical History:  Diagnosis Date  . Diabetes mellitus    Type II, diagnosed 2011, not on insulin; admitted in 2011 for hyperglycemia  . HTN (hypertension)   . Seizure disorder ([place])    diagnosed in childhood, last seizure was years ago    Review of Systems:  All pertinents listed in HPI, otherwise negative.    Physical Exam:  Vitals:   09/03/16 1322  BP: 134/74  Pulse: 61  Temp: 98.2 F (36.8 C)  TempSrc: Oral  SpO2: 99%  Weight: 227 lb 4.8 oz (103.1 kg)  Height: 5\' 11"  (1.803 m)   Constitutional: NAD, appears comfortable Cardiovascular: RRR, no murmurs, rubs, or gallops.  Pulmonary/Chest: CTAB, no wheezes, rales, or rhonchi. Left gynecomastia, no appreciable mass or overlying skin changes Abdominal: Soft, non tender, non distended. +BS.  Extremities: Warm and well perfused. Distal pulses intact. No edema.  Neurological: A&Ox3, CN II - XII grossly intact.    Assessment & Plan:   See Encounters Tab for problem based charting.  Patient discussed with Dr. Dareen Piano

## 2016-09-03 ENCOUNTER — Encounter: Payer: Self-pay | Admitting: Internal Medicine

## 2016-09-03 ENCOUNTER — Ambulatory Visit (INDEPENDENT_AMBULATORY_CARE_PROVIDER_SITE_OTHER): Payer: Medicare HMO | Admitting: Internal Medicine

## 2016-09-03 DIAGNOSIS — N189 Chronic kidney disease, unspecified: Secondary | ICD-10-CM

## 2016-09-03 DIAGNOSIS — E1122 Type 2 diabetes mellitus with diabetic chronic kidney disease: Secondary | ICD-10-CM

## 2016-09-03 DIAGNOSIS — Z6831 Body mass index (BMI) 31.0-31.9, adult: Secondary | ICD-10-CM

## 2016-09-03 DIAGNOSIS — I10 Essential (primary) hypertension: Secondary | ICD-10-CM

## 2016-09-03 DIAGNOSIS — G40909 Epilepsy, unspecified, not intractable, without status epilepticus: Secondary | ICD-10-CM

## 2016-09-03 DIAGNOSIS — N521 Erectile dysfunction due to diseases classified elsewhere: Secondary | ICD-10-CM

## 2016-09-03 DIAGNOSIS — E669 Obesity, unspecified: Secondary | ICD-10-CM

## 2016-09-03 DIAGNOSIS — I129 Hypertensive chronic kidney disease with stage 1 through stage 4 chronic kidney disease, or unspecified chronic kidney disease: Secondary | ICD-10-CM

## 2016-09-03 DIAGNOSIS — N62 Hypertrophy of breast: Secondary | ICD-10-CM | POA: Diagnosis not present

## 2016-09-03 DIAGNOSIS — E785 Hyperlipidemia, unspecified: Secondary | ICD-10-CM | POA: Diagnosis not present

## 2016-09-03 DIAGNOSIS — Z79899 Other long term (current) drug therapy: Secondary | ICD-10-CM

## 2016-09-03 DIAGNOSIS — E119 Type 2 diabetes mellitus without complications: Secondary | ICD-10-CM

## 2016-09-03 LAB — GLUCOSE, CAPILLARY: Glucose-Capillary: 110 mg/dL — ABNORMAL HIGH (ref 65–99)

## 2016-09-03 LAB — POCT GLYCOSYLATED HEMOGLOBIN (HGB A1C): Hemoglobin A1C: 5.9

## 2016-09-03 MED ORDER — DIVALPROEX SODIUM ER 500 MG PO TB24: 1000.0000 mg | ORAL_TABLET | Freq: Every day | ORAL | 3 refills | Status: DC

## 2016-09-03 MED ORDER — ROSUVASTATIN CALCIUM 20 MG PO TABS: 20.0000 mg | ORAL_TABLET | Freq: Every day | ORAL | 11 refills | Status: DC

## 2016-09-03 MED ORDER — SILDENAFIL CITRATE 20 MG PO TABS: 20.0000 mg | ORAL_TABLET | ORAL | 0 refills | Status: DC | PRN

## 2016-09-03 NOTE — Patient Instructions (Signed)
[name], I have provided refills for your medications and sent them to your pharmacy. I have changed your lipid medicine to Crestor. Please continue taking your other medications as previously prescribed. I have placed an order for you to have a mammogram to evaluate your enlarged breast. You should be contacted to schedule the imaging. Please follow up in 3 months or sooner if any issues arise. If you have any questions or concerns, call our clinic at [phone] or after hours call [phone] and ask for the internal medicine resident on call. Thank you!

## 2016-09-03 NOTE — Assessment & Plan Note (Addendum)
Due to costs concerns, I provided patient with a [facility] handout offering low dose sildenafil as an affordable low cost alternative to brand name viagra. I provided patient with a printed prescription and advised him to check with other local pharmacies regarding pricing.  -- Sildenafil 20 mg #50 tablets (take 2-5 tablets as needed for sexual activity)

## 2016-09-03 NOTE — Assessment & Plan Note (Signed)
He has not had a seizure since restarting his Valproate in 2015. He remains well controlled on this medication and denies any side effects.  -- Refilled Depakote ER 500 mg q24 hours

## 2016-09-03 NOTE — Assessment & Plan Note (Addendum)
Patient has left-sided gynecomastia that he first noticed back in 2015. He was partially work up last year and found to have a low testosterone level. He was referred for a mammogram but never completed the testing.  I do not appreciate any palpable masses on exam today. But given that it is unilateral, I do feel it is important to rule out a malignant process even though this is relatively uncommon in men. We discussed this possibility and the importance of obtaining the mammogram. Patient is agreeable with this plan. We will also repeat a morning testosterone level and check a procalcitonin today. -- Diagnostic mammography  -- AM testosterone  -- Check procalcitonin today

## 2016-09-04 LAB — BMP8+ANION GAP
Anion Gap: 16 mmol/L (ref 10.0–18.0)
BUN/Creatinine Ratio: 9 — ABNORMAL LOW (ref 10–24)
BUN: 14 mg/dL (ref 8–27)
CO2: 27 mmol/L (ref 18–29)
Calcium: 9.8 mg/dL (ref 8.6–10.2)
Chloride: 100 mmol/L (ref 96–106)
Creatinine, Ser: 1.6 mg/dL — ABNORMAL HIGH (ref 0.76–1.27)
GFR calc Af Amer: 51 mL/min/{1.73_m2} — ABNORMAL LOW
GFR calc non Af Amer: 44 mL/min/{1.73_m2} — ABNORMAL LOW
Glucose: 103 mg/dL — ABNORMAL HIGH (ref 65–99)
Potassium: 4.5 mmol/L (ref 3.5–5.2)
Sodium: 143 mmol/L (ref 134–144)

## 2016-09-04 LAB — PROCALCITONIN: PROCALCITONIN: 0.03 ng/mL (ref 0.00–0.08)

## 2016-09-04 NOTE — Addendum Note (Signed)
Addended by: Jodean Lima on: 09/04/2016 03:43 PM   Modules accepted: Orders

## 2016-09-04 NOTE — Progress Notes (Signed)
Internal Medicine Clinic Attending  Case discussed with Dr. Guilloud at the time of the visit.  We reviewed the resident's history and exam and pertinent patient test results.  I agree with the assessment, diagnosis, and plan of care documented in the resident's note.  

## 2016-09-05 ENCOUNTER — Telehealth: Payer: Self-pay | Admitting: Dietician

## 2016-09-05 NOTE — Telephone Encounter (Signed)
Called patient per Dr. Philipp Ovens to find out if he has a regular eye doctor. He does not and has not seen one in years. He also asked if he had diabetes. I deferred this to Dr. Philipp Ovens. He agrees to an eye doctor referral.

## 2016-09-06 NOTE — Telephone Encounter (Signed)
Ok. You may want to consider changing diagnosis to prediabetes if you haven't already so you don't get penalized for not getting health maintenance for diabetes done?  Butch Penny

## 2016-09-06 NOTE — Telephone Encounter (Signed)
Thank you for your help Tony [name]. The patient technically has pre-diabetes. He is not currently on any medication. We discussed diet and life-style modifications at his last visit to remain off DM meds.

## 2016-09-07 ENCOUNTER — Encounter: Payer: Self-pay | Admitting: Internal Medicine

## 2016-09-07 NOTE — Telephone Encounter (Signed)
Done. Thank you.

## 2016-11-26 ENCOUNTER — Encounter: Payer: Self-pay | Admitting: Internal Medicine

## 2016-11-26 ENCOUNTER — Encounter (INDEPENDENT_AMBULATORY_CARE_PROVIDER_SITE_OTHER): Payer: Self-pay

## 2016-11-26 ENCOUNTER — Ambulatory Visit (INDEPENDENT_AMBULATORY_CARE_PROVIDER_SITE_OTHER): Payer: Medicare HMO | Admitting: Internal Medicine

## 2016-11-26 VITALS — BP 142/67 | HR 65 | Temp 98.1°F | Wt 221.4 lb

## 2016-11-26 DIAGNOSIS — I10 Essential (primary) hypertension: Secondary | ICD-10-CM

## 2016-11-26 DIAGNOSIS — E785 Hyperlipidemia, unspecified: Secondary | ICD-10-CM

## 2016-11-26 DIAGNOSIS — G40909 Epilepsy, unspecified, not intractable, without status epilepticus: Secondary | ICD-10-CM

## 2016-11-26 DIAGNOSIS — N521 Erectile dysfunction due to diseases classified elsewhere: Secondary | ICD-10-CM

## 2016-11-26 DIAGNOSIS — Z79899 Other long term (current) drug therapy: Secondary | ICD-10-CM | POA: Diagnosis not present

## 2016-11-26 DIAGNOSIS — N62 Hypertrophy of breast: Secondary | ICD-10-CM | POA: Diagnosis not present

## 2016-11-26 DIAGNOSIS — E221 Hyperprolactinemia: Secondary | ICD-10-CM | POA: Diagnosis not present

## 2016-11-26 MED ORDER — ROSUVASTATIN CALCIUM 20 MG PO TABS: 20.0000 mg | ORAL_TABLET | Freq: Every day | ORAL | 11 refills | Status: DC

## 2016-11-26 NOTE — Assessment & Plan Note (Signed)
Plan was to start Crestor 20 mg daily at his last visit for primary prevention with ASCVD risk of 40%. However patient never picked up the prescription. Encouraged patient to take the medication. Sent a new prescription to his pharmacy.

## 2016-11-26 NOTE — Assessment & Plan Note (Signed)
Patient has a history of seizure disorder but has been well controlled on valproate since 2015. Patient reported an isolated seizure 3 years ago for which a cause was never identified. Today, he was inquiring about coming off of his antiepileptic medication. On further chart review, it appears that his seizure disorder is much more severe that patient is letting on. Per documentation his last seizure occurred after being off medication x 3 years. He first began having seizures at the age of 42. At one point, he was having seizures almost daily and was enrolled in a study at [place] with Depakote, dilantin, and some other unknown medications. He was not approved to drive until the age of 83. Today I advised patient to continue taking his medication as the risks of stopping therapy far out weigh the risks to continue treatment, especially since he is tolerating the medication well and has been seizure free for 3 years. Patient was agreeable.  -- Continue Depakote

## 2016-11-26 NOTE — Assessment & Plan Note (Addendum)
Patient has unilateral left-sided gynecomastia that he first noticed back in 2015. He was partially worked up last year and found to have a low testosterone level. He was referred for a diagnostic mammogram at his last visit but unfortunately never completed the testing. I do not appreciate any masses on exam today. I suspect this is either hormonally related from low testosterone given his concomitant erectile dysfunction or possibly drug induced from his valproate. However, given that it is unilateral it is important to rule out an underlying malignant process.  -- Diagnostic mammogram  -- Prolactin level today  -- F/u 3 months   ADDENDUM: Prolactin is mildly elevated. Will plan to repeat at his next visit. If persistently high, will consider brain imaging. Patient still needs mammogram. Will follow this up.

## 2016-11-26 NOTE — Assessment & Plan Note (Signed)
Patient was prescribed low dose sildenafil at his last visit as a replacement for viagra due to cost concerns. He was instructed to take 2-5 tablets 30 minutes prior to sexual activity. Today, patient reports that he only took one pill without effect. Advised patient that he likely took too low of a dose. Instructed him to try 2 pills prior to sexual activity. If still ineffective, up titrate (up to 5 pills) until effective.

## 2016-11-26 NOTE — Patient Instructions (Addendum)
[name],   It was a pleasure seeing you today. I will call you with the results of your lab work today. I have placed another order for your mammogram. You should be called to schedule an appointment. For your erectile dysfunction, please take 2-5 tablets of your sildenafil 30 minutes to 1 hour prior to sexual activity. I have started you on a medicine (crestor)  for high cholesterol to help prevent strokes and heart attacks. I have sent a prescription to your pharmacy. Please take this medicine every day. You may continue taking your other medications as previously prescribed. Please follow up in 3 months or sooner if you need me. If you have any questions or concerns, call our clinic at [phone] or after hours call [phone] and ask for the internal medicine resident on call.  - Dr. Philipp Ovens    Erectile Dysfunction Erectile dysfunction (ED) is the inability to get or keep an erection in order to have sexual intercourse. Erectile dysfunction may include:  Inability to get an erection.  Lack of enough hardness of the erection to allow penetration.  Loss of the erection before sex is finished. What are the causes? This condition may be caused by:  Certain medicines, such as:  Pain relievers.  Antihistamines.  Antidepressants.  Blood pressure medicines.  Water pills (diuretics).  Ulcer medicines.  Muscle relaxants.  Drugs.  Excessive drinking.  Psychological causes, such as:  Anxiety.  Depression.  Sadness.  Exhaustion.  Performance fear.  Stress.  Physical causes, such as:  Artery problems. This may include diabetes, smoking, liver disease, or atherosclerosis.  High blood pressure.  Hormonal problems, such as low testosterone.  Obesity.  Nerve problems. This may include back or pelvic injuries, diabetes mellitus, multiple sclerosis, or Parkinson disease. What are the signs or symptoms? Symptoms of this condition include:  Inability to get an  erection.  Lack of enough hardness of the erection to allow penetration.  Loss of the erection before sex is finished.  Normal erections at some times, but with frequent unsatisfactory episodes.  Low sexual satisfaction in either partner due to erection problems.  A curved penis occurring with erection. The curve may cause pain or the penis may be too curved to allow for intercourse.  Never having nighttime erections. How is this diagnosed? This condition is often diagnosed by:  Performing a physical exam to find other diseases or specific problems with the penis.  Asking you detailed questions about the problem.  Performing blood tests to check for diabetes mellitus or to measure hormone levels.  Performing other tests to check for underlying health conditions.  Performing an ultrasound exam to check for scarring.  Performing a test to check blood flow to the penis.  Doing a sleep study at home to measure nighttime erections. How is this treated? This condition may be treated by:  Medicine taken by mouth to help you achieve an erection (oral medicine).  Hormone replacement therapy to replace low testosterone levels.  Medicine that is injected into the penis. Your health care provider may instruct you how to give yourself these injections at home.  Vacuum pump. This is a pump with a ring on it. The pump and ring are placed on the penis and used to create pressure that helps the penis become erect.  Penile implant surgery. In this procedure, you may receive:  An inflatable implant. This consists of cylinders, a pump, and a reservoir. The cylinders can be inflated with a fluid that helps to  create an erection, and they can be deflated after intercourse.  A semi-rigid implant. This consists of two silicone rubber rods. The rods provide some rigidity. They are also flexible, so the penis can both curve downward in its normal position and become straight for sexual  intercourse.  Blood vessel surgery, to improve blood flow to the penis. During this procedure, a blood vessel from a different part of the body is placed into the penis to allow blood to flow around (bypass) damaged or blocked blood vessels.  Lifestyle changes, such as exercising more, losing weight, and quitting smoking. Follow these instructions at home: Medicines   Take over-the-counter and prescription medicines only as told by your health care provider. Do not increase the dosage without first discussing it with your health care provider.  If you are using self-injections, perform injections as directed by your health care provider. Make sure to avoid any veins that are on the surface of the penis. After giving an injection, apply pressure to the injection site for 5 minutes. General instructions   Exercise regularly, as directed by your health care provider. Work with your health care provider to lose weight, if needed.  Do not use any products that contain nicotine or tobacco, such as cigarettes and e-cigarettes. If you need help quitting, ask your health care provider.  Before using a vacuum pump, read the instructions that come with the pump and discuss any questions with your health care provider.  Keep all follow-up visits as told by your health care provider. This is important. Contact a health care provider if:  You feel nauseous.  You vomit. Get help right away if:  You are taking oral or injectable medicines and you have an erection that lasts longer than 4 hours. If your health care provider is unavailable, go to the nearest emergency room for evaluation. An erection that lasts much longer than 4 hours can result in permanent damage to your penis.  You have severe pain in your groin or abdomen.  You develop redness or severe swelling of your penis.  You have redness spreading up into your groin or lower abdomen.  You are unable to urinate.  You experience chest  pain or a rapid heart beat (palpitations) after taking oral medicines. Summary  Erectile dysfunction (ED) is the inability to get or keep an erection during sexual intercourse. This problem can usually be treated successfully.  This condition is diagnosed based on a physical exam, your symptoms, and tests to determine the cause. Treatment varies depending on the cause, and may include medicines, hormone therapy, surgery, or vacuum pump.  You may need follow-up visits to make sure that you are using your medicines or devices correctly.  Get help right away if you are taking or injecting medicines and you have an erection that lasts longer than 4 hours. This information is not intended to replace advice given to you by your health care provider. Make sure you discuss any questions you have with your health care provider. Document Released: 07/13/2000 Document Revised: 08/01/2016 Document Reviewed: 08/01/2016 Elsevier Interactive Patient Education  2017 [employer].

## 2016-11-26 NOTE — Assessment & Plan Note (Signed)
BP is elevated today, 144/81. Patient reports he forgot to take his medication today. He has been well controlled on lisinopril-hctz 10-12.5 mg daily for many years. Encouraged compliance.  -- Continue lisinopril-hctz 10-12.5 mg daily  -- f/u 3 months

## 2016-11-26 NOTE — Progress Notes (Signed)
   CC: BP follow up  HPI:  [name] is a 66 y.o. male with past medical history outlined below here for follow up of his HTN. For the details of today's visit, please refer to the assessment and plan.  Past Medical History:  Diagnosis Date  . Diabetes mellitus    Type II, diagnosed 2011, not on insulin; admitted in 2011 for hyperglycemia  . HTN (hypertension)   . Seizure disorder ([place])    diagnosed in childhood, last seizure was years ago    Review of Systems:  All pertinents listed in HPI, otherwise negative  Physical Exam:  Vitals:   11/26/16 1419 11/26/16 1454  BP: (!) 144/81 (!) 142/67  Pulse: 76 65  Temp: 98.1 F (36.7 C)   TempSrc: Oral   SpO2: 100%   Weight: 221 lb 6.4 oz (100.4 kg)     Constitutional: NAD, appears comfortable Cardiovascular: RRR, no murmurs, rubs, or gallops.  Pulmonary/Chest: CTAB, no wheezes, rales, or rhonchi. Left gynecomastia, no appreciable mass or overlying skin changes Abdominal: Soft, non tender, non distended. +BS.  Extremities: Warm and well perfused. Distal pulses intact. No edema.  Neurological: A&Ox3, CN II - XII grossly intact.   Assessment & Plan:   See Encounters Tab for problem based charting.  Patient discussed with Dr. Evette Doffing

## 2016-11-27 LAB — PROLACTIN: PROLACTIN: 25.3 ng/mL — AB (ref 4.0–15.2)

## 2016-11-27 NOTE — Progress Notes (Signed)
Internal Medicine Clinic Attending  Case discussed with Dr. Guilloud at the time of the visit.  We reviewed the resident's history and exam and pertinent patient test results.  I agree with the assessment, diagnosis, and plan of care documented in the resident's note.  

## 2017-03-29 ENCOUNTER — Other Ambulatory Visit: Payer: Self-pay | Admitting: Internal Medicine

## 2017-03-29 DIAGNOSIS — I10 Essential (primary) hypertension: Secondary | ICD-10-CM

## 2017-04-22 ENCOUNTER — Encounter: Payer: Medicare HMO | Admitting: Internal Medicine

## 2017-04-23 ENCOUNTER — Other Ambulatory Visit: Payer: Self-pay | Admitting: Internal Medicine

## 2017-04-23 DIAGNOSIS — I10 Essential (primary) hypertension: Secondary | ICD-10-CM

## 2017-05-06 ENCOUNTER — Ambulatory Visit: Payer: Medicare HMO

## 2017-05-06 NOTE — Telephone Encounter (Signed)
Approved linisopril-HCTZ 10-12.5 mg daily, 90 day supply per pharmacy request. Please schedule follow up appointment for patient. Thank you.

## 2017-08-06 ENCOUNTER — Other Ambulatory Visit: Payer: Self-pay | Admitting: *Deleted

## 2017-08-06 DIAGNOSIS — I10 Essential (primary) hypertension: Secondary | ICD-10-CM

## 2017-08-06 MED ORDER — LISINOPRIL-HYDROCHLOROTHIAZIDE 10-12.5 MG PO TABS: 1.0000 | ORAL_TABLET | Freq: Every day | ORAL | 0 refills | Status: DC

## 2017-08-06 NOTE — Telephone Encounter (Signed)
Approved refill for lisinopril-hctz 10-12.5 mg 30 days supply. Last refill approved 04/23/17 for 90 days under the assumption that patient would follow up. Unfortunately 2 subsequent appointments were canceled, once per provider and once due to lack of transportation. This is the last refill I will approve without patient being seen in clinic. If my schedule is full, an [facility] appointment is fine. Thank you.

## 2017-08-06 NOTE — Telephone Encounter (Signed)
Received faxed refill request from pt's pharmacy-call made to pt to make him an appt to see pcp as he is overdue for a visit, no answer-message left on recorder.  Will request to pcp and front office for an appt.Despina Hidden Cassady1/8/20194:57 PM

## 2017-08-07 ENCOUNTER — Encounter: Payer: Self-pay | Admitting: Internal Medicine

## 2017-08-20 ENCOUNTER — Encounter: Payer: Self-pay | Admitting: Internal Medicine

## 2017-08-29 ENCOUNTER — Encounter: Payer: Self-pay | Admitting: Internal Medicine

## 2017-08-29 ENCOUNTER — Ambulatory Visit (INDEPENDENT_AMBULATORY_CARE_PROVIDER_SITE_OTHER): Payer: Medicare HMO | Admitting: Internal Medicine

## 2017-08-29 VITALS — BP 175/85 | HR 65 | Temp 98.4°F | Wt 225.1 lb

## 2017-08-29 DIAGNOSIS — Z79899 Other long term (current) drug therapy: Secondary | ICD-10-CM | POA: Diagnosis not present

## 2017-08-29 DIAGNOSIS — Z1159 Encounter for screening for other viral diseases: Secondary | ICD-10-CM

## 2017-08-29 DIAGNOSIS — I10 Essential (primary) hypertension: Secondary | ICD-10-CM

## 2017-08-29 DIAGNOSIS — N183 Chronic kidney disease, stage 3 unspecified: Secondary | ICD-10-CM

## 2017-08-29 DIAGNOSIS — N62 Hypertrophy of breast: Secondary | ICD-10-CM | POA: Diagnosis not present

## 2017-08-29 DIAGNOSIS — E785 Hyperlipidemia, unspecified: Secondary | ICD-10-CM | POA: Diagnosis not present

## 2017-08-29 DIAGNOSIS — Z23 Encounter for immunization: Secondary | ICD-10-CM

## 2017-08-29 DIAGNOSIS — G40909 Epilepsy, unspecified, not intractable, without status epilepticus: Secondary | ICD-10-CM

## 2017-08-29 DIAGNOSIS — I129 Hypertensive chronic kidney disease with stage 1 through stage 4 chronic kidney disease, or unspecified chronic kidney disease: Secondary | ICD-10-CM

## 2017-08-29 DIAGNOSIS — R7303 Prediabetes: Secondary | ICD-10-CM | POA: Diagnosis not present

## 2017-08-29 MED ORDER — LISINOPRIL-HYDROCHLOROTHIAZIDE 20-12.5 MG PO TABS: 1.0000 | ORAL_TABLET | Freq: Every day | ORAL | 11 refills | Status: DC

## 2017-08-29 MED ORDER — LISINOPRIL-HYDROCHLOROTHIAZIDE 10-12.5 MG PO TABS: 1.0000 | ORAL_TABLET | Freq: Every day | ORAL | 5 refills | Status: DC

## 2017-08-29 MED ORDER — ROSUVASTATIN CALCIUM 20 MG PO TABS: 20.0000 mg | ORAL_TABLET | Freq: Every day | ORAL | 11 refills | Status: DC

## 2017-08-29 MED ORDER — DIVALPROEX SODIUM ER 500 MG PO TB24: 1000.0000 mg | ORAL_TABLET | Freq: Every day | ORAL | 5 refills | Status: DC

## 2017-08-29 NOTE — Progress Notes (Signed)
   CC: hypertension  HPI:  Tony [name] is a 67 y.o. with a PMH of hypertension, prediabetes, hypercholesterolemia, unilateral gynecomastia, history of seizures presenting to clinic for follow-up on blood pressure.  Hypertension Patient was supposed to be on lisinopril-HCTZ 10-12.5 mg daily.  He reports he has been out of this medicine for a while.  He states when he was taking you need to check blood pressure at home and was well controlled.  He denies chest pain, shortness of breath, headaches, vision changes, focal weakness or numbness.  Unilateral gynecomastia Patient with history of unilateral gynecomastia on left.  He reports no changes.  Prolactin level was checked last visit was slightly elevated.  He has been referred for mammogram in the past but has never made the appointment.  Today he continues to deny changes but consistent gynecomastia on the left.  He denies skin changes, tenderness, nipple discharge.  He denies visual field loss or vision changes.  He denies dizziness.  Please see problem based Assessment and Plan for status of patients chronic conditions.  Past Medical History:  Diagnosis Date  . Diabetes mellitus    Type II, diagnosed 2011, not on insulin; admitted in 2011 for hyperglycemia  . HTN (hypertension)   . Seizure disorder ([place])    diagnosed in childhood, last seizure was years ago    Review of Systems:   ROS as per HPI  Physical Exam:  Vitals:   08/29/17 1317  BP: (!) 175/85  Pulse: 65  Temp: 98.4 F (36.9 C)  TempSrc: Oral  SpO2: 100%  Weight: 225 lb 1.6 oz (102.1 kg)   GENERAL- alert, co-operative, appears as stated age, not in any distress. HEENT- Atraumatic, normocephalic, PERRL, EOMI, oral mucosa appears moist, poor dentition CARDIAC- RRR, no murmurs, rubs or gallops. RESP- Moving equal volumes of air, and clear to auscultation bilaterally, no wheezes or crackles. Chest-unilateral gynecomastia on the left, without tenderness, overlying  skin changes, nipple discharge. ABDOMEN- Soft, nontender, bowel sounds present. NEURO-CN II through XII intact, visual fields intact, strength and sensation intact throughout. EXTREMITIES- pulse 2+ PT, symmetric, no lower extremity edema. SKIN- Warm, dry.  PSYCH- Normal mood and affect, appropriate thought content and speech.  Assessment & Plan:   See Encounters Tab for problem based charting.   Patient discussed with Dr. Carmel [place], MD Internal Medicine PGY2

## 2017-08-29 NOTE — Assessment & Plan Note (Addendum)
Patient was stable unilateral left-sided gynecomastia per 3-4 years now.  He has been found to have a low testosterone level, it is highly elevated prolactin level.  He has been referred in the past for diagnostic mammogram however has not had it done yet.  Exam remarkable for left-sided gynecomastia with no focal tenderness, nodularity, skin change.  He has no focal or visual field deficits or neurological deficits.  Plan --reorder diagnostic mammogram, discussed the importance of completing this.  Patient was given information on where to schedule appointment. --Follow-up prolactin level for trend  --Based on results he may need brain MRI to evaluate for prolactinoma.  Addendum: Prolactin level stable/slightly decreased

## 2017-08-29 NOTE — Assessment & Plan Note (Signed)
Refill Depakote.

## 2017-08-29 NOTE — Assessment & Plan Note (Addendum)
History of CKD 3 in setting of hypertension and prediabetes.  Hypertension uncontrolled currently.  Unable to assess diabetes at this appointment.  Plan Follow-up bmet - stable

## 2017-08-29 NOTE — Assessment & Plan Note (Addendum)
Uncontrolled and off of medications currently.  He is asymptomatic.  Plan Restart lisinopril-HCTZ 20-12.5 mg daily;inc as prior blood pressures not well controlled. Follow-up Bmet. Follow-up in 2-4 weeks with PCP for further medication adjustment as necessary

## 2017-08-29 NOTE — Assessment & Plan Note (Signed)
Not taking statin.  Plan: --restart crestor 20mg  daily

## 2017-08-29 NOTE — Patient Instructions (Signed)
I have refilled your blood pressure medication; take one pill once a day.  I have refilled your depakote.  Please schedule your mammogram so we can evaluate your chest swelling.  I will check some lab work today, including your kidney function.

## 2017-08-30 LAB — BMP8+ANION GAP
Anion Gap: 15 mmol/L (ref 10.0–18.0)
BUN/Creatinine Ratio: 10 (ref 10–24)
BUN: 14 mg/dL (ref 8–27)
CO2: 26 mmol/L (ref 20–29)
CREATININE: 1.35 mg/dL — AB (ref 0.76–1.27)
Calcium: 9.5 mg/dL (ref 8.6–10.2)
Chloride: 103 mmol/L (ref 96–106)
GFR calc Af Amer: 62 mL/min/{1.73_m2} (ref >59)
GFR calc non Af Amer: 54 mL/min/{1.73_m2} — ABNORMAL LOW (ref >59)
Glucose: 94 mg/dL (ref 65–99)
POTASSIUM: 4.6 mmol/L (ref 3.5–5.2)
Sodium: 144 mmol/L (ref 134–144)

## 2017-08-30 LAB — HEPATITIS C ANTIBODY: Hep C Virus Ab: 0.3 s/co ratio (ref 0.0–0.9)

## 2017-08-30 LAB — PROLACTIN: PROLACTIN: 22.4 ng/mL — AB (ref 4.0–15.2)

## 2017-08-30 NOTE — Progress Notes (Signed)
Internal Medicine Clinic Attending  Case discussed with Dr. Svalina  at the time of the visit.  We reviewed the resident's history and exam and pertinent patient test results.  I agree with the assessment, diagnosis, and plan of care documented in the resident's note.  

## 2017-11-13 ENCOUNTER — Telehealth: Payer: Self-pay | Admitting: *Deleted

## 2017-11-13 DIAGNOSIS — I10 Essential (primary) hypertension: Secondary | ICD-10-CM

## 2017-11-13 MED ORDER — LISINOPRIL-HYDROCHLOROTHIAZIDE 20-12.5 MG PO TABS: 1.0000 | ORAL_TABLET | Freq: Every day | ORAL | 3 refills | Status: DC

## 2017-11-13 NOTE — Telephone Encounter (Signed)
Zestoretic 10/12.5 mg #30 with 11 refills sent to [facility] on 08/29/2017. Received fax from [facility] today requesting this be changed to 90 day supply. Will route to PCP for consideration. Hubbard Hartshorn, RN, BSN

## 2017-11-13 NOTE — Telephone Encounter (Signed)
Lisinopril-HCTZ prescription changed to 90 day supply.

## 2017-11-20 ENCOUNTER — Other Ambulatory Visit: Payer: Self-pay

## 2017-11-20 ENCOUNTER — Ambulatory Visit (INDEPENDENT_AMBULATORY_CARE_PROVIDER_SITE_OTHER): Payer: Medicare HMO | Admitting: Internal Medicine

## 2017-11-20 ENCOUNTER — Encounter: Payer: Self-pay | Admitting: Internal Medicine

## 2017-11-20 DIAGNOSIS — E119 Type 2 diabetes mellitus without complications: Secondary | ICD-10-CM | POA: Diagnosis not present

## 2017-11-20 DIAGNOSIS — E221 Hyperprolactinemia: Secondary | ICD-10-CM

## 2017-11-20 DIAGNOSIS — N529 Male erectile dysfunction, unspecified: Secondary | ICD-10-CM | POA: Diagnosis not present

## 2017-11-20 DIAGNOSIS — Z79899 Other long term (current) drug therapy: Secondary | ICD-10-CM | POA: Diagnosis not present

## 2017-11-20 DIAGNOSIS — N62 Hypertrophy of breast: Secondary | ICD-10-CM | POA: Diagnosis not present

## 2017-11-20 DIAGNOSIS — I10 Essential (primary) hypertension: Secondary | ICD-10-CM

## 2017-11-20 NOTE — Assessment & Plan Note (Signed)
Patient has mild hyperprolactinemia documented on 2 separate readings checked during work-up for unilateral gynecomastia.  He is not currently on any medications that could result in hyperprolactinemia.  He denies headaches and visual fields are intact.  On exam he has unilateral gynecomastia of his left breast.  He also has associated erectile dysfunction for which he takes as needed sildenafil.  We discussed work-up today with pituitary MRI to rule out prolactinoma, patient is agreeable. --F/u Pituitary MRI

## 2017-11-20 NOTE — Assessment & Plan Note (Signed)
Blood pressure today is uncontrolled, 148/83.  He has not taken his medication today.  I have been unable to capture a blood pressure reading while consistently taking his lisinopril-HCTZ.  Every visit he has either forgotten to take his medication or has run out of his prescription.  He is asymptomatic.  Reports he tolerates the medication well.  Will continue with current regimen for now. I have provided patient with blood pressure log instructed to check readings at home.  Encouraged compliance.  --Continue lisinopril-HCTZ 20-12.5 mg daily --Follow-up 1 month with blood pressure log

## 2017-11-20 NOTE — Progress Notes (Signed)
   CC: HTN follow up  HPI:  [name] is a 67 y.o. male with past medical history outlined below here for HTN follow up. For the details of today's visit, please refer to the assessment and plan.  Past Medical History:  Diagnosis Date  . Diabetes mellitus    Type II, diagnosed 2011, not on insulin; admitted in 2011 for hyperglycemia  . HTN (hypertension)   . Seizure disorder ([place])    diagnosed in childhood, last seizure was years ago    Review of Systems  Eyes: Negative for blurred vision.  Neurological: Negative for headaches.    Physical Exam:  Vitals:   11/20/17 1054 11/20/17 1119  BP: (!) 148/83 (!) 146/71  Pulse: (!) 59 (!) 55  Temp: 98.2 F (36.8 C)   TempSrc: Oral   SpO2: 100%   Weight: 217 lb 8 oz (98.7 kg)     Constitutional: NAD, appears comfortable Cardiac: RRR, no m/r/g Pulmonary/Chest: CTAB, no wheezes, rales, or rhonchi. Unilateral left gynecomastia, diffuse tissue enlargement without nodularity or overlying skin changes. Extremities: Warm and well perfused. No edema.  Psychiatric: Normal mood and affect  Assessment & Plan:   See Encounters Tab for problem based charting.  Patient discussed with Dr. Evette Doffing

## 2017-11-20 NOTE — Assessment & Plan Note (Signed)
Patient has unilateral left-sided gynecomastia that has been persistent now for 4 years.  I suspect this is either secondary to hyperprolactinemia or possibly drug-induced from his valproate.  But given that it is unilateral, we are attempting to rule out breast malignancy.  Diagnostic mammogram ordered at her prior visit has not been done.  Per records, he was unable to be contacted for scheduling. Front desk will help him arrange the study. -- Schedule diagnostic mammogram, will follow up

## 2017-11-20 NOTE — Patient Instructions (Signed)
FOLLOW-UP INSTRUCTIONS When: 1 month For: BP follow up What to bring: Medications   Tony [name],  It was a pleasure to see you. Please continue to take your medicine as previously prescribed. Please check your blood pressure 3 x a week and record it on the sheet provided. Follow up with me again in 1 month and bring this with you.   I have ordered a mammogram and an MRI of your brain. You will be contacted to schedule this. It is important that we get these test done in order to rule out a cancer. If you have any questions or concerns, call our clinic at [phone] or after hours call [phone] and ask for the internal medicine resident on call. Thank you!  - Dr. Philipp Ovens

## 2017-11-21 NOTE — Progress Notes (Signed)
Internal Medicine Clinic Attending  Case discussed with Dr. Guilloud at the time of the visit.  We reviewed the resident's history and exam and pertinent patient test results.  I agree with the assessment, diagnosis, and plan of care documented in the resident's note.  

## 2017-12-18 ENCOUNTER — Other Ambulatory Visit: Payer: Self-pay

## 2017-12-18 ENCOUNTER — Ambulatory Visit (INDEPENDENT_AMBULATORY_CARE_PROVIDER_SITE_OTHER): Payer: Medicare HMO | Admitting: Internal Medicine

## 2017-12-18 VITALS — BP 134/69 | HR 58 | Temp 97.9°F | Wt 219.7 lb

## 2017-12-18 DIAGNOSIS — K0889 Other specified disorders of teeth and supporting structures: Secondary | ICD-10-CM

## 2017-12-18 DIAGNOSIS — I10 Essential (primary) hypertension: Secondary | ICD-10-CM

## 2017-12-18 DIAGNOSIS — Z79899 Other long term (current) drug therapy: Secondary | ICD-10-CM

## 2017-12-18 DIAGNOSIS — E221 Hyperprolactinemia: Secondary | ICD-10-CM

## 2017-12-18 DIAGNOSIS — Z972 Presence of dental prosthetic device (complete) (partial): Secondary | ICD-10-CM | POA: Diagnosis not present

## 2017-12-18 NOTE — Progress Notes (Signed)
   CC: Blood pressure follow up  HPI:  [name] is a 67 y.o. male with PMHx detailed below presenting for one-month follow-up of his elevated blood pressure.  He was taking lisinopril hydrochlorothiazide without change but had previously missed his dose multiple times prior to clinic appointments limiting our assessments.  He took his medicine this morning.  He was also recommended to get a MRI brain for evaluation of his hyperprolactinemia with associated gynecomastia.  He is unaware of this being scheduled yet.  See problem based assessment and plan below for additional details.  Hypertension He is here today with blood pressure well controlled at 134/69 after taking his medication this morning.  He has a home blood pressure log that is almost entirely at goal with a maximum recording of 790 systolic.  He is not having any new symptomatic complaints and does seem adequately controlled when he takes the medicine. Plan: Continue lisinopril-HCTZ 20-12.5 mg daily   Past Medical History:  Diagnosis Date  . Diabetes mellitus    Type II, diagnosed 2011, not on insulin; admitted in 2011 for hyperglycemia  . HTN (hypertension)   . Seizure disorder ([place])    diagnosed in childhood, last seizure was years ago    Review of Systems: Review of Systems  Eyes: Negative for blurred vision.  Cardiovascular: Negative for leg swelling.  Musculoskeletal: Negative for falls.  Neurological: Negative for dizziness and headaches.     Physical Exam: Vitals:   12/18/17 1003  BP: 134/69  Pulse: (!) 58  Temp: 97.9 F (36.6 C)  TempSrc: Oral  SpO2: 99%  Weight: 219 lb 11.2 oz (99.7 kg)   GENERAL- alert, co-operative, NAD HEENT-poorly fitting partial upper and lower dentures, extremely poor dentition with some gingival irregularity, missing teeth, and cracked teeth CARDIAC- RRR, no murmurs, rubs or gallops. RESP- CTAB, no wheezes or crackles. EXTREMITIES- symmetric, no pedal edema. SKIN-  Warm, dry, No rash or lesion. PSYCH- Normal mood and affect, appropriate thought content and speech.   Assessment & Plan:   See encounters tab for problem based medical decision making.   Patient discussed with Dr. Beryle Beams

## 2017-12-18 NOTE — Patient Instructions (Signed)
Your blood pressure is well controlled today on your current medicines.  I think we still need to pursue getting the imaging of your brain to assess for a problem at the pituitary gland causing abnormal hormone levels.

## 2017-12-20 NOTE — Progress Notes (Signed)
Medicine attending: Medical history, presenting problems, physical findings, and medications, reviewed with resident physician Dr Christopher Rice on the day of the patient visit and I concur with his evaluation and management plan. 

## 2017-12-20 NOTE — Assessment & Plan Note (Signed)
He is here today with blood pressure well controlled at 134/69 after taking his medication this morning.  He has a home blood pressure log that is almost entirely at goal with a maximum recording of 916 systolic.  He is not having any new symptomatic complaints and does seem adequately controlled when he takes the medicine. Plan: Continue lisinopril-HCTZ 20-12.5 mg daily

## 2018-01-07 ENCOUNTER — Ambulatory Visit (HOSPITAL_COMMUNITY): Admission: RE | Admit: 2018-01-07 | Payer: Medicare HMO | Source: Ambulatory Visit

## 2018-01-14 ENCOUNTER — Ambulatory Visit (HOSPITAL_COMMUNITY)
Admission: RE | Admit: 2018-01-14 | Discharge: 2018-01-14 | Disposition: A | Discharge status: Home / Self Care | Payer: Medicare HMO | Source: Ambulatory Visit | Attending: Student in an Organized Health Care Education/Training Program | Admitting: Student in an Organized Health Care Education/Training Program

## 2018-01-14 DIAGNOSIS — E221 Hyperprolactinemia: Secondary | ICD-10-CM | POA: Insufficient documentation

## 2018-01-14 DIAGNOSIS — Z8673 Personal history of transient ischemic attack (TIA), and cerebral infarction without residual deficits: Secondary | ICD-10-CM | POA: Diagnosis not present

## 2018-01-14 DIAGNOSIS — G319 Degenerative disease of nervous system, unspecified: Secondary | ICD-10-CM | POA: Insufficient documentation

## 2018-01-14 LAB — CREATININE, SERUM
Creatinine, Ser: 1.84 mg/dL — ABNORMAL HIGH (ref 0.61–1.24)
GFR calc Af Amer: 42 mL/min — ABNORMAL LOW (ref >60)
GFR calc non Af Amer: 36 mL/min — ABNORMAL LOW (ref >60)

## 2018-01-14 MED ORDER — GADOBENATE DIMEGLUMINE 529 MG/ML IV SOLN
15.0000 mL | Freq: Once | INTRAVENOUS | Status: AC | PRN
  Administered 2018-01-14: 15 mL via INTRAVENOUS

## 2018-01-17 NOTE — Progress Notes (Signed)
Called patient with results of pituitary MRI. No evidence of pituitary adenoma. Will follow prolactin level.

## 2018-06-30 ENCOUNTER — Ambulatory Visit (INDEPENDENT_AMBULATORY_CARE_PROVIDER_SITE_OTHER): Payer: Medicare HMO | Admitting: Internal Medicine

## 2018-06-30 ENCOUNTER — Other Ambulatory Visit: Payer: Self-pay

## 2018-06-30 ENCOUNTER — Encounter: Payer: Self-pay | Admitting: Internal Medicine

## 2018-06-30 VITALS — BP 124/51 | HR 67 | Temp 98.5°F | Ht 71.0 in | Wt 226.0 lb

## 2018-06-30 DIAGNOSIS — E221 Hyperprolactinemia: Secondary | ICD-10-CM | POA: Diagnosis not present

## 2018-06-30 DIAGNOSIS — I10 Essential (primary) hypertension: Secondary | ICD-10-CM | POA: Diagnosis not present

## 2018-06-30 DIAGNOSIS — N400 Enlarged prostate without lower urinary tract symptoms: Secondary | ICD-10-CM

## 2018-06-30 DIAGNOSIS — N183 Chronic kidney disease, stage 3 unspecified: Secondary | ICD-10-CM

## 2018-06-30 DIAGNOSIS — N62 Hypertrophy of breast: Secondary | ICD-10-CM

## 2018-06-30 DIAGNOSIS — N529 Male erectile dysfunction, unspecified: Secondary | ICD-10-CM | POA: Diagnosis not present

## 2018-06-30 DIAGNOSIS — R3912 Poor urinary stream: Secondary | ICD-10-CM | POA: Diagnosis not present

## 2018-06-30 DIAGNOSIS — N401 Enlarged prostate with lower urinary tract symptoms: Secondary | ICD-10-CM | POA: Diagnosis not present

## 2018-06-30 DIAGNOSIS — R3916 Straining to void: Secondary | ICD-10-CM

## 2018-06-30 DIAGNOSIS — R351 Nocturia: Secondary | ICD-10-CM | POA: Diagnosis not present

## 2018-06-30 DIAGNOSIS — Z79899 Other long term (current) drug therapy: Secondary | ICD-10-CM

## 2018-06-30 MED ORDER — TAMSULOSIN HCL 0.4 MG PO CAPS
0.4000 mg | ORAL_CAPSULE | Freq: Every day | ORAL | 2 refills | Status: DC
Start: 2018-06-30 — End: 2018-10-20

## 2018-06-30 NOTE — Progress Notes (Signed)
Preformed POCUS bladder/prostate, bladder was not post void, prostate noted to be enlarged at 4.9x6.2cm in short axis, length also noted at 4cm, some heterogeneity of prostate and irregular aspect of prostate that protruded to bladder.

## 2018-06-30 NOTE — Assessment & Plan Note (Signed)
Checking BMP

## 2018-06-30 NOTE — Patient Instructions (Signed)
Tony [name],  It was a pleasure to see you again. Today we discussed:  Difficulty urinating: This is due to your enlarged prostate. I have given you a prescription for flomax to help with these symptoms. Take this medication once daily 30 minutes after breakfast. This may lower your blood pressure. If you develop symptoms of dizziness or lightheadedness, please check your blood pressure and give [place] a call. We are also checking a PSA level to screen for prostate cancer. I will call you with these results.   High Prolactin levels: This is likely causing your erectile dysfunction and enlarged breast tissue. I am rechecking your prolactin level today. I have referred you to endocrinology for further evaluation. You may need to start a medication for this to help your symptoms.   Continue to take your medications as previously prescribed. Follow up with me again in 3 months or sooner if you have any issues. If you have any questions or concerns, call our clinic at [phone] or after hours call [phone] and ask for the internal medicine resident on call. Thank you!  Dr. Philipp Ovens

## 2018-06-30 NOTE — Assessment & Plan Note (Signed)
Patient has mild hyperprolactinemia documented on 2 separate readings, checked for work up of bilateral gynecomastia (L> R). He also has associated erectile dysfunction. Previous work up with pituitary MRI was negative for microadenoma or empty sella syndrome. He is not on any medications that could result in elevated prolactin levels. Overall unclear etiology. Idiopathic vs. Possibly secondary to his renal dysfunction and decreased clearance. However, given that he has associated hypogonadal symptoms (gynecomastia & ED) we discussed treatment with a dopamine agonist and referral to endocrinology. His erectile dysfunction and gynecomastia are very bothersome to him and is very interested in treatment. Will recheck prolactin today and referral to endocrinology. Will defer initiation of dopamine agonist to them.  -- Repeat prolactin, check TSH -- Endocrinology referral  -- Follow up 3 months

## 2018-06-30 NOTE — Assessment & Plan Note (Signed)
BP at goal today, 124/51. He reports compliance.  -- Continue lisinopril-hctz 20-12.5 mg

## 2018-06-30 NOTE — Progress Notes (Signed)
   CC: HTN follow up  HPI:  [name] is a 67 y.o. male with past medical history outlined below here for HTN follow up. For the details of today's visit, please refer to the assessment and plan.  Past Medical History:  Diagnosis Date  . Diabetes mellitus    Type II, diagnosed 2011, not on insulin; admitted in 2011 for hyperglycemia  . HTN (hypertension)   . Seizure disorder ([place])    diagnosed in childhood, last seizure was years ago    Review of Systems  Genitourinary:       Weak stream, nocturia    Physical Exam:  Vitals:   06/30/18 1414 06/30/18 1443  BP: (!) 142/76 (!) 124/51  Pulse: 76 67  Temp: 98.5 F (36.9 C)   TempSrc: Oral   SpO2: 100%   Weight: 226 lb (102.5 kg)   Height: 5\' 11"  (1.803 m)     Constitutional: NAD, appears comfortable Cardiovascular: RRR, no murmurs, rubs, or gallops.  Pulmonary/Chest: CTAB, no wheezes, rales, or rhonchi.  Extremities: Warm and well perfused. No edema.  Psychiatric: Normal mood and affect  Assessment & Plan:   See Encounters Tab for problem based charting.  Patient discussed with Dr. Dareen [name]   Patient seen with Dr. Heber South [name] for POC ultrasound.

## 2018-06-30 NOTE — Assessment & Plan Note (Signed)
Patient is complaining of difficulty initiating urinary stream, weak urinary stream, and nocturia. POC ultrasound was performed today and revealed an enlarged prostate (6x4 cm) with an abnormal heterogenous appearance. There was also a nodule with irregular border and stalk protruding into his bladder. We discussed potential etiologies for enlarge prostate including BPH and prostate cancer. Given the abnormal appearance of his prostate on imaging, he is agreeable to proceeding with PSA screening and urology referral / further work up if lab returns elevated.  -- Start flomax 0.4 mg qd; counseled to monitor for symptoms of hypotension; has BP cuff at home  -- Check PSA -- Follow up 3 months

## 2018-07-01 LAB — BMP8+ANION GAP
Anion Gap: 16 mmol/L (ref 10.0–18.0)
BUN / CREAT RATIO: 13 (ref 10–24)
BUN: 21 mg/dL (ref 8–27)
CO2: 26 mmol/L (ref 20–29)
CREATININE: 1.65 mg/dL — AB (ref 0.76–1.27)
Calcium: 9.9 mg/dL (ref 8.6–10.2)
Chloride: 101 mmol/L (ref 96–106)
GFR, EST AFRICAN AMERICAN: 49 mL/min/{1.73_m2} — AB (ref >59)
GFR, EST NON AFRICAN AMERICAN: 42 mL/min/{1.73_m2} — AB (ref >59)
Glucose: 92 mg/dL (ref 65–99)
Potassium: 4.4 mmol/L (ref 3.5–5.2)
SODIUM: 143 mmol/L (ref 134–144)

## 2018-07-01 LAB — TSH: TSH: 0.966 u[IU]/mL (ref 0.450–4.500)

## 2018-07-01 LAB — PROLACTIN: Prolactin: 22.4 ng/mL — ABNORMAL HIGH (ref 4.0–15.2)

## 2018-07-01 LAB — PSA: Prostate Specific Ag, Serum: 3.7 ng/mL (ref 0.0–4.0)

## 2018-07-02 NOTE — Progress Notes (Signed)
Internal Medicine Clinic Attending  Case discussed with Dr. Guilloud at the time of the visit.  We reviewed the resident's history and exam and pertinent patient test results.  I agree with the assessment, diagnosis, and plan of care documented in the resident's note.  

## 2018-10-05 ENCOUNTER — Other Ambulatory Visit: Payer: Self-pay | Admitting: Internal Medicine

## 2018-10-05 DIAGNOSIS — G40909 Epilepsy, unspecified, not intractable, without status epilepticus: Secondary | ICD-10-CM

## 2018-10-08 ENCOUNTER — Other Ambulatory Visit: Payer: Self-pay | Admitting: Internal Medicine

## 2018-10-08 DIAGNOSIS — E349 Endocrine disorder, unspecified: Secondary | ICD-10-CM | POA: Diagnosis not present

## 2018-10-08 DIAGNOSIS — N62 Hypertrophy of breast: Secondary | ICD-10-CM

## 2018-10-08 DIAGNOSIS — E221 Hyperprolactinemia: Secondary | ICD-10-CM | POA: Diagnosis not present

## 2018-10-08 DIAGNOSIS — N189 Chronic kidney disease, unspecified: Secondary | ICD-10-CM | POA: Diagnosis not present

## 2018-10-08 DIAGNOSIS — Z125 Encounter for screening for malignant neoplasm of prostate: Secondary | ICD-10-CM | POA: Diagnosis not present

## 2018-10-08 DIAGNOSIS — Z8669 Personal history of other diseases of the nervous system and sense organs: Secondary | ICD-10-CM | POA: Diagnosis not present

## 2018-10-08 DIAGNOSIS — R39198 Other difficulties with micturition: Secondary | ICD-10-CM | POA: Diagnosis not present

## 2018-10-14 ENCOUNTER — Ambulatory Visit
Admission: RE | Admit: 2018-10-14 | Discharge: 2018-10-14 | Disposition: A | Discharge status: Home / Self Care | Payer: Medicare HMO | Source: Ambulatory Visit | Attending: Internal Medicine | Admitting: Internal Medicine

## 2018-10-14 ENCOUNTER — Other Ambulatory Visit: Payer: Self-pay

## 2018-10-14 ENCOUNTER — Ambulatory Visit: Payer: Medicare HMO

## 2018-10-14 DIAGNOSIS — R928 Other abnormal and inconclusive findings on diagnostic imaging of breast: Secondary | ICD-10-CM | POA: Diagnosis not present

## 2018-10-14 DIAGNOSIS — E221 Hyperprolactinemia: Secondary | ICD-10-CM

## 2018-10-14 DIAGNOSIS — N62 Hypertrophy of breast: Secondary | ICD-10-CM

## 2018-10-14 DIAGNOSIS — E349 Endocrine disorder, unspecified: Secondary | ICD-10-CM

## 2018-10-14 DIAGNOSIS — N6489 Other specified disorders of breast: Secondary | ICD-10-CM | POA: Diagnosis not present

## 2018-10-20 ENCOUNTER — Encounter: Payer: Self-pay | Admitting: Internal Medicine

## 2018-10-20 ENCOUNTER — Ambulatory Visit (INDEPENDENT_AMBULATORY_CARE_PROVIDER_SITE_OTHER): Payer: Medicare HMO | Admitting: Internal Medicine

## 2018-10-20 ENCOUNTER — Other Ambulatory Visit: Payer: Self-pay

## 2018-10-20 VITALS — BP 158/76 | HR 68 | Temp 99.4°F | Wt 225.2 lb

## 2018-10-20 DIAGNOSIS — I10 Essential (primary) hypertension: Secondary | ICD-10-CM

## 2018-10-20 DIAGNOSIS — N400 Enlarged prostate without lower urinary tract symptoms: Secondary | ICD-10-CM

## 2018-10-20 DIAGNOSIS — G40909 Epilepsy, unspecified, not intractable, without status epilepticus: Secondary | ICD-10-CM | POA: Diagnosis not present

## 2018-10-20 DIAGNOSIS — E785 Hyperlipidemia, unspecified: Secondary | ICD-10-CM

## 2018-10-20 DIAGNOSIS — R413 Other amnesia: Secondary | ICD-10-CM

## 2018-10-20 DIAGNOSIS — F0280 Dementia in other diseases classified elsewhere without behavioral disturbance: Secondary | ICD-10-CM

## 2018-10-20 DIAGNOSIS — F0390 Unspecified dementia without behavioral disturbance: Secondary | ICD-10-CM | POA: Insufficient documentation

## 2018-10-20 DIAGNOSIS — Z79899 Other long term (current) drug therapy: Secondary | ICD-10-CM

## 2018-10-20 DIAGNOSIS — G309 Alzheimer's disease, unspecified: Principal | ICD-10-CM

## 2018-10-20 MED ORDER — LISINOPRIL-HYDROCHLOROTHIAZIDE 20-12.5 MG PO TABS: 1.0000 | ORAL_TABLET | Freq: Every day | ORAL | 3 refills | Status: DC

## 2018-10-20 MED ORDER — TAMSULOSIN HCL 0.4 MG PO CAPS: 0.4000 mg | ORAL_CAPSULE | Freq: Every day | ORAL | 2 refills | Status: DC

## 2018-10-20 MED ORDER — DIVALPROEX SODIUM ER 500 MG PO TB24: 1000.0000 mg | ORAL_TABLET | Freq: Every day | ORAL | 11 refills | Status: DC

## 2018-10-20 MED ORDER — DONEPEZIL HCL 5 MG PO TABS: 5.0000 mg | ORAL_TABLET | Freq: Every day | ORAL | 2 refills | Status: DC

## 2018-10-20 MED ORDER — ROSUVASTATIN CALCIUM 20 MG PO TABS
20.0000 mg | ORAL_TABLET | Freq: Every day | ORAL | 11 refills | Status: DC
Start: 2018-10-20 — End: 2018-12-16

## 2018-10-20 NOTE — Patient Instructions (Addendum)
Tony [name],  It was a pleasure to see you again. I have sent refills of all of your medications to your pharmacy.   I have started you on two new medications. The first one is called flomax, and will help with your urinary problems. The second is called donepezil (aka aricept) for your memory. I will call you in a couple of months to check in.  I have placed a Education officer, museum consult for you to see if we can get extra help in the house for you and your son.   If you have any questions or concerns, call our clinic at [phone] or after hours call [phone] and ask for the internal medicine resident on call. Thank you!  Dr. Rocco Serene

## 2018-10-20 NOTE — Progress Notes (Signed)
   CC: Memory loss  HPI:  [name] is a 68 y.o. male with past medical history outlined below here for memory loss. For the details of today's visit, please refer to the assessment and plan.  Past Medical History:  Diagnosis Date  . Diabetes mellitus    Type II, diagnosed 2011, not on insulin; admitted in 2011 for hyperglycemia  . HTN (hypertension)   . Seizure disorder ([place])    diagnosed in childhood, last seizure was years ago    Review of Systems  Genitourinary:       Weak stream   Psychiatric/Behavioral: Positive for memory loss. Negative for depression.    Physical Exam:  Vitals:   10/20/18 1337  BP: (!) 158/76  Pulse: 68  Temp: 99.4 F (37.4 C)  TempSrc: Oral  SpO2: 100%  Weight: 225 lb 3.2 oz (102.2 kg)    Constitutional: NAD, appears comfortable Cardiovascular: RRR, no m/r/g Pulmonary/Chest: CTAB, no wheezes, rales, or rhonchi.  Extremities: Warm and well perfused. No edema.  Psychiatric: Normal mood and affect  Assessment & Plan:   See Encounters Tab for problem based charting.  Patient discussed with Dr. Daryll Drown

## 2018-10-21 ENCOUNTER — Encounter: Payer: Self-pay | Admitting: Internal Medicine

## 2018-10-21 ENCOUNTER — Telehealth: Payer: Self-pay | Admitting: *Deleted
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
## 2018-10-21 NOTE — Assessment & Plan Note (Signed)
Blood pressure uncontrolled, however due to his dementia and memory loss I suspect he is not taking his medications. I have sent refills to his pharmacy. Discussed with his brother [name] who will help manage his medications while we arrange for a home health nurse.  -- Restart lisinopril-HCTZ 20-12.5 mg daily

## 2018-10-21 NOTE — Telephone Encounter (Signed)
Received referral for [place], SW and Aide. Call placed to nephew, Karel Turpen. He does not have a preference for [place]. Call placed to Joen Laura, RN with [facility]. She is able to take patient for above services. She will contact [name] today. She will call back with SOC date. Hubbard Hartshorn, RN, BSN

## 2018-10-21 NOTE — Assessment & Plan Note (Signed)
Crestor 20 mg restarted.

## 2018-10-21 NOTE — Assessment & Plan Note (Signed)
Patient has a history of seizure disorder diagnosed at age 69. His last seizure occurred after being off medication x 3 years. At one point, he was having seizures almost daily and was enrolled in a study at [place] with Depakote, dilantin, and some other unknown medications. He was not approved to drive until the age of 35. He has been advised to continue antiepileptics indefinitely. He tells me that he is taking his Depakote, but I am concerned given his memory and non compliance with other medications that he is not consistent. Again, I have discussed this with his brother [name] who will assist until we can arrange for a home health nurse.  -- Refilled Depakote 1,000 mg daily

## 2018-10-21 NOTE — Assessment & Plan Note (Signed)
Continues to have symptoms of nocturia and weak stream. Flomax restarted this visit. Brother [name] will assist with medications.

## 2018-10-24 ENCOUNTER — Telehealth: Payer: Self-pay | Admitting: Internal Medicine

## 2018-10-24 DIAGNOSIS — R39198 Other difficulties with micturition: Secondary | ICD-10-CM | POA: Diagnosis not present

## 2018-10-24 DIAGNOSIS — E221 Hyperprolactinemia: Secondary | ICD-10-CM | POA: Diagnosis not present

## 2018-10-24 DIAGNOSIS — Z8669 Personal history of other diseases of the nervous system and sense organs: Secondary | ICD-10-CM | POA: Diagnosis not present

## 2018-10-24 DIAGNOSIS — E230 Hypopituitarism: Secondary | ICD-10-CM | POA: Diagnosis not present

## 2018-10-24 DIAGNOSIS — R972 Elevated prostate specific antigen [PSA]: Secondary | ICD-10-CM | POA: Diagnosis not present

## 2018-10-24 DIAGNOSIS — N62 Hypertrophy of breast: Secondary | ICD-10-CM | POA: Diagnosis not present

## 2018-10-24 DIAGNOSIS — N189 Chronic kidney disease, unspecified: Secondary | ICD-10-CM | POA: Diagnosis not present

## 2018-10-24 NOTE — Telephone Encounter (Signed)
RN calling to report [place] date for 10/25/2017 with [facility].

## 2018-10-24 NOTE — Telephone Encounter (Signed)
So noted 

## 2018-10-26 DIAGNOSIS — I10 Essential (primary) hypertension: Secondary | ICD-10-CM | POA: Diagnosis not present

## 2018-10-26 DIAGNOSIS — G309 Alzheimer's disease, unspecified: Secondary | ICD-10-CM | POA: Diagnosis not present

## 2018-10-26 DIAGNOSIS — R351 Nocturia: Secondary | ICD-10-CM | POA: Diagnosis not present

## 2018-10-26 DIAGNOSIS — G40909 Epilepsy, unspecified, not intractable, without status epilepticus: Secondary | ICD-10-CM | POA: Diagnosis not present

## 2018-10-26 DIAGNOSIS — E119 Type 2 diabetes mellitus without complications: Secondary | ICD-10-CM | POA: Diagnosis not present

## 2018-10-26 DIAGNOSIS — E785 Hyperlipidemia, unspecified: Secondary | ICD-10-CM | POA: Diagnosis not present

## 2018-10-26 DIAGNOSIS — F0280 Dementia in other diseases classified elsewhere without behavioral disturbance: Secondary | ICD-10-CM | POA: Diagnosis not present

## 2018-10-26 DIAGNOSIS — N401 Enlarged prostate with lower urinary tract symptoms: Secondary | ICD-10-CM | POA: Diagnosis not present

## 2018-10-27 ENCOUNTER — Telehealth: Payer: Self-pay

## 2018-10-27 DIAGNOSIS — G309 Alzheimer's disease, unspecified: Secondary | ICD-10-CM

## 2018-10-27 DIAGNOSIS — G40909 Epilepsy, unspecified, not intractable, without status epilepticus: Secondary | ICD-10-CM

## 2018-10-27 DIAGNOSIS — F0280 Dementia in other diseases classified elsewhere without behavioral disturbance: Secondary | ICD-10-CM

## 2018-10-27 NOTE — Telephone Encounter (Signed)
Tony [name] with [facility] requesting VO for skilled nursing and PT evaluation. Please call back.

## 2018-10-27 NOTE — Telephone Encounter (Signed)
Returned call to Olin Hauser, Therapist, sports with [facility]. Verbal Josem Kaufmann given for [place] 2 week 3 and PT Eval for home safety. States patient is able to shower by self, keep immaculate home and yard and give FT care to son with Huntington's disease. Does not think he will qualify for [place]. Thinks it would be better to try and get help for son but is unsure who his PCP is. Patient does have difficulty with memory. She wonders if he may be having seizures related to not remembering to take meds. She will set up pill box for patient. States he is inappropriate in things he says. Wonders if he has mood disorder and thinks he may benefit from [facility] referral. She will discuss these concerns with nephew, [name] as well. Will route to PCP for agreement/denial of [place] orders. Hubbard Hartshorn, RN, BSN

## 2018-10-29 DIAGNOSIS — G309 Alzheimer's disease, unspecified: Secondary | ICD-10-CM | POA: Diagnosis not present

## 2018-10-29 DIAGNOSIS — G40909 Epilepsy, unspecified, not intractable, without status epilepticus: Secondary | ICD-10-CM | POA: Diagnosis not present

## 2018-10-29 DIAGNOSIS — E119 Type 2 diabetes mellitus without complications: Secondary | ICD-10-CM | POA: Diagnosis not present

## 2018-10-29 DIAGNOSIS — N401 Enlarged prostate with lower urinary tract symptoms: Secondary | ICD-10-CM | POA: Diagnosis not present

## 2018-10-29 DIAGNOSIS — E785 Hyperlipidemia, unspecified: Secondary | ICD-10-CM | POA: Diagnosis not present

## 2018-10-29 DIAGNOSIS — F0280 Dementia in other diseases classified elsewhere without behavioral disturbance: Secondary | ICD-10-CM | POA: Diagnosis not present

## 2018-10-29 DIAGNOSIS — I10 Essential (primary) hypertension: Secondary | ICD-10-CM | POA: Diagnosis not present

## 2018-10-29 DIAGNOSIS — R351 Nocturia: Secondary | ICD-10-CM | POA: Diagnosis not present

## 2018-10-29 NOTE — Telephone Encounter (Signed)
SOC date was 10/26/2018 per Olin Hauser, RN with [facility]. Hubbard Hartshorn, RN, BSN

## 2018-10-29 NOTE — Telephone Encounter (Signed)
Thank you Lauren. What does nursing "2 week 3" mean? He really needs nursing to come out daily to help with his medications. He has pretty advanced dementia and is not taking them. He is able to compensate quite well, actually took me 2 years to pick up on his dementia, so after a short interaction I can see why they may not see a need for an aid. But I feel strongly that he is not safe to cook in the house. His brother [name] agrees. [name] is able to provide a lot of valuable information if patient agrees to let [facility] speak with them. Patient is no longer able to care for his son with advanced Huntington's even though it appears that way. [name] has been caring for both of them.  I will give VO for the maximum about of support he will get approved, but I do think he needs daily nursing and would ask he be reconsidered for an aid. Thank you!

## 2018-10-29 NOTE — Progress Notes (Signed)
Internal Medicine Clinic Attending  Case discussed with Dr. Guilloud at the time of the visit.  We reviewed the resident's history and exam and pertinent patient test results.  I agree with the assessment, diagnosis, and plan of care documented in the resident's note.  

## 2018-10-30 DIAGNOSIS — I10 Essential (primary) hypertension: Secondary | ICD-10-CM | POA: Diagnosis not present

## 2018-10-30 DIAGNOSIS — N401 Enlarged prostate with lower urinary tract symptoms: Secondary | ICD-10-CM | POA: Diagnosis not present

## 2018-10-30 DIAGNOSIS — G40909 Epilepsy, unspecified, not intractable, without status epilepticus: Secondary | ICD-10-CM | POA: Diagnosis not present

## 2018-10-30 DIAGNOSIS — R351 Nocturia: Secondary | ICD-10-CM | POA: Diagnosis not present

## 2018-10-30 DIAGNOSIS — F0280 Dementia in other diseases classified elsewhere without behavioral disturbance: Secondary | ICD-10-CM | POA: Diagnosis not present

## 2018-10-30 DIAGNOSIS — G309 Alzheimer's disease, unspecified: Secondary | ICD-10-CM | POA: Diagnosis not present

## 2018-10-30 DIAGNOSIS — E119 Type 2 diabetes mellitus without complications: Secondary | ICD-10-CM | POA: Diagnosis not present

## 2018-10-30 DIAGNOSIS — E785 Hyperlipidemia, unspecified: Secondary | ICD-10-CM | POA: Diagnosis not present

## 2018-10-31 ENCOUNTER — Telehealth: Payer: Self-pay | Admitting: Internal Medicine

## 2018-10-31 DIAGNOSIS — E785 Hyperlipidemia, unspecified: Secondary | ICD-10-CM | POA: Diagnosis not present

## 2018-10-31 DIAGNOSIS — G40909 Epilepsy, unspecified, not intractable, without status epilepticus: Secondary | ICD-10-CM | POA: Diagnosis not present

## 2018-10-31 DIAGNOSIS — I10 Essential (primary) hypertension: Secondary | ICD-10-CM | POA: Diagnosis not present

## 2018-10-31 DIAGNOSIS — F0280 Dementia in other diseases classified elsewhere without behavioral disturbance: Secondary | ICD-10-CM | POA: Diagnosis not present

## 2018-10-31 DIAGNOSIS — E119 Type 2 diabetes mellitus without complications: Secondary | ICD-10-CM | POA: Diagnosis not present

## 2018-10-31 DIAGNOSIS — R351 Nocturia: Secondary | ICD-10-CM | POA: Diagnosis not present

## 2018-10-31 DIAGNOSIS — G309 Alzheimer's disease, unspecified: Secondary | ICD-10-CM | POA: Diagnosis not present

## 2018-10-31 DIAGNOSIS — N401 Enlarged prostate with lower urinary tract symptoms: Secondary | ICD-10-CM | POA: Diagnosis not present

## 2018-10-31 NOTE — Telephone Encounter (Signed)
Pt doesn't need home health PT; Per Joey @ [facility] [phone]

## 2018-11-04 DIAGNOSIS — R351 Nocturia: Secondary | ICD-10-CM | POA: Diagnosis not present

## 2018-11-04 DIAGNOSIS — G309 Alzheimer's disease, unspecified: Secondary | ICD-10-CM | POA: Diagnosis not present

## 2018-11-04 DIAGNOSIS — N401 Enlarged prostate with lower urinary tract symptoms: Secondary | ICD-10-CM | POA: Diagnosis not present

## 2018-11-04 DIAGNOSIS — I10 Essential (primary) hypertension: Secondary | ICD-10-CM | POA: Diagnosis not present

## 2018-11-04 DIAGNOSIS — F0280 Dementia in other diseases classified elsewhere without behavioral disturbance: Secondary | ICD-10-CM | POA: Diagnosis not present

## 2018-11-04 DIAGNOSIS — G40909 Epilepsy, unspecified, not intractable, without status epilepticus: Secondary | ICD-10-CM | POA: Diagnosis not present

## 2018-11-04 DIAGNOSIS — E785 Hyperlipidemia, unspecified: Secondary | ICD-10-CM | POA: Diagnosis not present

## 2018-11-04 DIAGNOSIS — E119 Type 2 diabetes mellitus without complications: Secondary | ICD-10-CM | POA: Diagnosis not present

## 2018-11-04 NOTE — Addendum Note (Signed)
Addended by: Jodean Lima on: 11/04/2018 09:32 PM   Modules accepted: Orders

## 2018-11-04 NOTE — Telephone Encounter (Signed)
2 week 3 means twice weekly for 3 weeks. I see patient is also on the [facility] registry. If you put in a referral to [facility] you can get pharmacy/nursing to go out to patient's home to assist with meds. [name] is in communication with the [facility] agency.

## 2018-11-06 DIAGNOSIS — G40909 Epilepsy, unspecified, not intractable, without status epilepticus: Secondary | ICD-10-CM | POA: Diagnosis not present

## 2018-11-06 DIAGNOSIS — F0280 Dementia in other diseases classified elsewhere without behavioral disturbance: Secondary | ICD-10-CM | POA: Diagnosis not present

## 2018-11-06 DIAGNOSIS — R351 Nocturia: Secondary | ICD-10-CM | POA: Diagnosis not present

## 2018-11-06 DIAGNOSIS — N401 Enlarged prostate with lower urinary tract symptoms: Secondary | ICD-10-CM | POA: Diagnosis not present

## 2018-11-06 DIAGNOSIS — E785 Hyperlipidemia, unspecified: Secondary | ICD-10-CM | POA: Diagnosis not present

## 2018-11-06 DIAGNOSIS — E119 Type 2 diabetes mellitus without complications: Secondary | ICD-10-CM | POA: Diagnosis not present

## 2018-11-06 DIAGNOSIS — I10 Essential (primary) hypertension: Secondary | ICD-10-CM | POA: Diagnosis not present

## 2018-11-06 DIAGNOSIS — G309 Alzheimer's disease, unspecified: Secondary | ICD-10-CM | POA: Diagnosis not present

## 2018-11-12 ENCOUNTER — Ambulatory Visit: Payer: Medicare HMO

## 2018-11-12 ENCOUNTER — Other Ambulatory Visit: Payer: Self-pay

## 2018-11-12 DIAGNOSIS — I10 Essential (primary) hypertension: Secondary | ICD-10-CM

## 2018-11-12 DIAGNOSIS — F0391 Unspecified dementia with behavioral disturbance: Secondary | ICD-10-CM

## 2018-11-12 NOTE — Patient Outreach (Deleted)
[facility]) Care Management  11/12/2018  [name] 1950/10/17 [id number]

## 2018-11-13 DIAGNOSIS — I10 Essential (primary) hypertension: Secondary | ICD-10-CM | POA: Diagnosis not present

## 2018-11-13 DIAGNOSIS — R351 Nocturia: Secondary | ICD-10-CM | POA: Diagnosis not present

## 2018-11-13 DIAGNOSIS — G40909 Epilepsy, unspecified, not intractable, without status epilepticus: Secondary | ICD-10-CM | POA: Diagnosis not present

## 2018-11-13 DIAGNOSIS — N401 Enlarged prostate with lower urinary tract symptoms: Secondary | ICD-10-CM | POA: Diagnosis not present

## 2018-11-13 DIAGNOSIS — E119 Type 2 diabetes mellitus without complications: Secondary | ICD-10-CM | POA: Diagnosis not present

## 2018-11-13 DIAGNOSIS — E785 Hyperlipidemia, unspecified: Secondary | ICD-10-CM | POA: Diagnosis not present

## 2018-11-13 DIAGNOSIS — G309 Alzheimer's disease, unspecified: Secondary | ICD-10-CM | POA: Diagnosis not present

## 2018-11-13 DIAGNOSIS — F0280 Dementia in other diseases classified elsewhere without behavioral disturbance: Secondary | ICD-10-CM | POA: Diagnosis not present

## 2018-11-17 ENCOUNTER — Ambulatory Visit: Payer: Self-pay | Admitting: Pharmacist

## 2018-11-20 ENCOUNTER — Other Ambulatory Visit: Payer: Self-pay

## 2018-11-20 NOTE — Patient Outreach (Signed)
[place] [facility]) Care Management  11/20/2018  MOUSSA WIEGAND 08-28-1950 [id number]   Unable to reach Mr. Jashun Puertas. Successful follow-up outreach with his brother, [name]. HIPAA identifiers verified. [name] agrees that Mr. Azzarello would benefit from daily meal delivery and occasional assistance in the home. Agreeable to follow up outreach with [facility] SW.  [name] currently prepares Mr. Giarratano' medications. We previously discussed possible transition to medication adherence packages. Pending follow-up outreach with [facility] Pharmacist.  PLAN -Will notify [facility] SW. -Will follow up with [facility] [place] -Will update [facility] Pharmacist. -Will follow up within two weeks.   [place] [phone]

## 2018-11-21 ENCOUNTER — Ambulatory Visit: Payer: Self-pay | Admitting: Pharmacist

## 2018-11-21 ENCOUNTER — Other Ambulatory Visit: Payer: Self-pay | Admitting: Pharmacist

## 2018-11-21 NOTE — Patient Outreach (Signed)
[place] [facility]) Care Management  [place]  11/21/2018  CAMERAN PETTEY Jan 14, 1951 [id number]   Reason for referral: Medication Adherence-potential compliance packs  Referral source: [facility] RN Current insurance: [facility]  Reason for call: comprehensive medication review & potential compliance packaging (patient with dementia-son is caregiver)  Outreach:  Unsuccessful telephone call attempt #1 to patient.   HIPAA compliant voicemail left requesting a return call  Plan:  -I will make another outreach attempt to patient within 3-4 business days.     Regina Eck, PharmD, [place]  [phone]

## 2018-11-24 ENCOUNTER — Other Ambulatory Visit: Payer: Self-pay

## 2018-11-24 NOTE — Patient Outreach (Signed)
[place] [facility]) Care Management  11/24/2018  Tony [name] October 21, 1950 [id number]   Successful outreach to patient's brother, Sherle Poe, regarding social work referral for assistance with meal delivery.  Brother reports that patient and another brother live together.  Patient suffers from dementia and brother has Huntington's Disease.  Both are unable to purchase and prepare meals.  Mr. Valda Lamb did report that family and neighbors assist with shopping for needed items and preparing some food that can just be heated up. BSW talked with Mr. Valda Lamb about Meals on Wheels program as well as [place] [facility] program which will temporarily allow both to receive seven meals per week until further notice. Mr. Valda Lamb consented to referrals for both programs.  BSW submitted referrals.   Both have been added to the wait list for Meals on Wheels.  The length of time for the wait list is undetermined at this time due to [place] They will receive their first delivery through [facility] program on Monday, 12/01/18. BSW called Mr. Flack back to provide this update.   Ronn Melena, BSW Social Worker [phone]

## 2018-11-25 ENCOUNTER — Other Ambulatory Visit: Payer: Self-pay | Admitting: Pharmacist

## 2018-11-25 ENCOUNTER — Ambulatory Visit: Payer: Self-pay | Admitting: Pharmacist

## 2018-11-25 NOTE — Patient Outreach (Addendum)
[facility] [facility]) Care Management  [place]   11/25/2018  Tony [name] [birth year] [id number]  Reason for referral: Medication Adherence  Referral source: [facility] RN Current insurance: [employer]  PMHx includes but not limited to:  HTN, dementia, seizure disorder, DMT2 (not on medications), BPH  Outreach:  Successful telephone call with patient's brother, Tony [name].  HIPAA identifiers verified. Tony [name] is grateful for outreach.  He states his brother is struggling with dementia and is not remembering to take his medication.  Patient is now experiencing seizures because he is not taking medications (divalproex) for seizure history.  He would like to transition medications to pharmacy with pill packaging and delivery.  He is open to using [facility] which would offer free pill packaging and free delivery.  He is not home to provide a full medication history, but will return my call when a medication list is available.  Objective: Lab Results  Component Value Date   CREATININE 1.65 (H) 06/30/2018   CREATININE 1.84 (H) 01/14/2018   CREATININE 1.35 (H) 08/29/2017    Lab Results  Component Value Date   HGBA1C 5.9 09/03/2016    Lipid Panel     Component Value Date/Time   CHOL 142 05/07/2014 1336   TRIG 164 (H) 05/07/2014 1336   HDL 33 (L) 05/07/2014 1336   CHOLHDL 4.3 05/07/2014 1336   VLDL 33 05/07/2014 1336   LDLCALC 76 05/07/2014 1336   BP Readings from Last 3 Encounters:  10/20/18 (!) 158/76  06/30/18 (!) 124/51  12/18/17 134/69   Medications Reviewed Today    Reviewed by Lavera Guise, [place] (Pharmacist) on 11/25/18 at [place] List Status: <None>  Medication Order Taking? Sig Documenting Provider Last Dose Status Informant  cabergoline (DOSTINEX) 0.5 MG tablet [id number] Yes Take 0.5 mg by mouth daily. [provider] Taking Active   divalproex (DEPAKOTE ER) 500 MG 24 hr tablet [id number] Yes Take 2 tablets (1,000 mg total) by mouth  daily. Velna Ochs, MD Taking Active   donepezil (ARICEPT) 5 MG tablet [id number] Yes Take 1 tablet (5 mg total) by mouth at bedtime. Velna Ochs, MD Taking Active   lisinopril-hydrochlorothiazide (ZESTORETIC) 20-12.5 MG tablet [id number] Yes Take 1 tablet by mouth daily. Velna Ochs, MD Taking Active   rosuvastatin (CRESTOR) 20 MG tablet [id number] Yes Take 1 tablet (20 mg total) by mouth at bedtime. Velna Ochs, MD Taking Active   tamsulosin [facility]) 0.4 MG CAPS capsule [id number] Yes Take 1 capsule (0.4 mg total) by mouth daily after breakfast. Velna Ochs, MD Taking Active          Assessment:  Drugs sorted by system:  Neurologic/Psychologic: donepezil, cabergoline, divalproex  Cardiovascular: lisinopril/HCTZ, rosuvastatin  Genitourinary: tamsulosin  Plan: . I will follow up with pharmacy to see when fills can transfer into compliance packaging-->patient just received 90-day supplies of most all medications, so he will not be eligible for packs until 2 more months.  Will follow up with patient in May regarding pill packs . Will mail AM/PM pillbox to simplify regimen until pill pack eligible   Regina Eck, PharmD, [place]  [phone]

## 2018-12-02 ENCOUNTER — Other Ambulatory Visit: Payer: Self-pay

## 2018-12-02 NOTE — Patient Outreach (Signed)
[place] [facility]) Care Management  12/02/2018  Tony [name] 1950-09-09 [id number]   Follow-up outreach with [name] brother, [name]. He confirmed meals were delivered on yesterday. He is still pending initial outreach with [facility] ([facility] [place]. Also discussed services available through [facility]. [name] felt that the program would be very beneficial and was agreeable to outreach from the intake coordinator.   Per [name], [name] experienced seizures on last week. [name] noted several tablets remaining in [name]' weekly pillbox. Reports that member was only taking one divalproex despite two tablets being in the pill box. States he experienced one seizure a night for three consecutive nights. Reports that member's son, Shanon Brow, witnessed the episodes. According to Shanon Brow, member was not injured and did not lose consciousness, but experienced weakness in his lower extremities. EMS was not notified. Per Consuella Lose is knowledgeable of seizure precautions and used appropriate safety measures. He is agreeable to [facility] updating PCP.  PLAN -Will update PCP. -Will update [facility] team. -Will contact [facility] -Will follow up with [place] Manager [phone]

## 2018-12-04 NOTE — Progress Notes (Signed)
Thank you for the update. I will have the front desk schedule a telehealth appointment for him. Patient needs to take both his valproate tablets. I agree [facility] may be very beneficial for him. If he chose to establish with [facility] this means he would no longer follow with our clinic as they assume PCP responsibilities. I support whatever the patient decides. Let me know if there is anything you need from [place] to help coordinate. Thank you.

## 2018-12-08 ENCOUNTER — Other Ambulatory Visit: Payer: Self-pay

## 2018-12-08 ENCOUNTER — Ambulatory Visit (INDEPENDENT_AMBULATORY_CARE_PROVIDER_SITE_OTHER): Payer: Medicare HMO | Admitting: Internal Medicine

## 2018-12-08 DIAGNOSIS — I10 Essential (primary) hypertension: Secondary | ICD-10-CM

## 2018-12-08 DIAGNOSIS — F0391 Unspecified dementia with behavioral disturbance: Secondary | ICD-10-CM | POA: Diagnosis not present

## 2018-12-08 DIAGNOSIS — G40909 Epilepsy, unspecified, not intractable, without status epilepticus: Secondary | ICD-10-CM | POA: Diagnosis not present

## 2018-12-08 NOTE — Progress Notes (Signed)
  [facility] Internal Medicine Residency Telephone Encounter Continuity Care Appointment  HPI:   This telephone encounter was created for [name] on 12/08/2018 for the following purpose/cc seizures, dementia, HTN, HLD.   Past Medical History:  Past Medical History:  Diagnosis Date  . Diabetes mellitus    Type II, diagnosed 2011, not on insulin; admitted in 2011 for hyperglycemia  . HTN (hypertension)   . Seizure disorder ([place])    diagnosed in childhood, last seizure was years ago      ROS:   Spoke with Brother, [name], he denied that patient had any complaints. Denied any recurrent seizures, leg pains, headaches, fevers, chills, nausea, vomiting, headaches, or other issues.    Assessment / Plan / Recommendations:   Please see A&P under problem oriented charting for assessment of the patient's acute and chronic medical conditions.   As always, pt is advised that if symptoms worsen or new symptoms arise, they should go to an urgent care facility or to to ER for further evaluation.   Consent and Medical Decision Making:   Patient discussed with Dr. Beryle Beams  This is a telephone encounter between [name] and Asencion Noble on 12/08/2018 for seizures, dementia, HTN, HLD. The visit was conducted with the patient located at home and Asencion Noble at [facility]. The patient's identity was confirmed using their DOB and current address. The his/her legal guardian has consented to being evaluated through a telephone encounter and understands the associated risks (an examination cannot be done and the patient may need to come in for an appointment) / benefits (allows the patient to remain at home, decreasing exposure to coronavirus). I personally spent 10 minutes on medical discussion.

## 2018-12-08 NOTE — Assessment & Plan Note (Addendum)
Patient was starting to have seizure nightly a few weeks ago, brother Tony [name] reported that patient was only taking 1 pill of his Depakote, patient restarted taking 2 pills. He has been using a pill box which contains all the pills and are in the process of getting pill packs from pharmacy. He has not been having any seizures since that time, is currently doing well. He has not been having any fevers, chills, nausea, vomiting, headaches, or other symptoms.  While he was having seizures he reports that he had eben having leg pains, since patient has tonic-clonic seizures this was likely contributing to the pain, since the seizures have subsided he denies any leg pain.   Tony [name] reports that they are in the process of getting set up with [facility], he is aware that if they establish with [facility] they will take over as primary. They are also in the process of getting home health services set up.   Plan: -Continue Depakote 1000 mg daily

## 2018-12-08 NOTE — Assessment & Plan Note (Signed)
This was diagnosed on his last visit and he was starting on donepazil 5 mg daily at that time. Spoke with brother [name] who reported that patient has been having some worsening memory issues. [name] helps him with his medications and setting up his pill boxes, they are in the process of getting the pill packets arranged so that it's easier for him to take his medications. We are still in the process of getting [place] set up for him. [name] also reported that they are trying to get [facility] arranged for him, it seems that this will be a good option for patient since he will need further assistance given his advancing dementia.   -Continue donepezil 5 mg daily -Continue with getting [facility] RN for assistance

## 2018-12-08 NOTE — Assessment & Plan Note (Signed)
Patient had uncontrolled blood pressure, he was having issues with his medications on his last visit. Spoke with his brother [name] who reported that patient has now been taking his pills as prescribed, he goes over to help set up his pill boxes and denies any issues. He was requesting a refill on his medications, it looks like he already has refills at the pharmacy.   -continue lisinopril-HCTZ 20-12.5 mg daily

## 2018-12-09 NOTE — Progress Notes (Signed)
Medicine attending: Medical history, presenting problems, physical complaints, and medications, reviewed with resident physician Dr Lonia Skinner on the day of the patient telephone consultation and I concur with her evaluation and management plan. Phone conversation with pt brother. Pt has advanced dementia. Seizure disorder. Recent hospitalization. Found to be taking 50% less Depakote than prescribed. Dose corrected. No interval seizures reported at this time. No other new issues.

## 2018-12-14 ENCOUNTER — Other Ambulatory Visit (HOSPITAL_COMMUNITY): Payer: Self-pay

## 2018-12-14 ENCOUNTER — Observation Stay (HOSPITAL_COMMUNITY): Payer: Medicare HMO

## 2018-12-14 ENCOUNTER — Other Ambulatory Visit: Payer: Self-pay

## 2018-12-14 ENCOUNTER — Emergency Department (HOSPITAL_COMMUNITY): Payer: Medicare HMO

## 2018-12-14 ENCOUNTER — Inpatient Hospital Stay (HOSPITAL_COMMUNITY)
Admission: EM | Admit: 2018-12-14 | Discharge: 2018-12-16 | DRG: 195 | Disposition: A | Discharge status: Home Health | Payer: Medicare HMO | Attending: Student in an Organized Health Care Education/Training Program | Admitting: Student in an Organized Health Care Education/Training Program

## 2018-12-14 ENCOUNTER — Encounter (HOSPITAL_COMMUNITY): Payer: Self-pay | Admitting: Emergency Medicine

## 2018-12-14 DIAGNOSIS — G9340 Encephalopathy, unspecified: Secondary | ICD-10-CM | POA: Diagnosis not present

## 2018-12-14 DIAGNOSIS — J189 Pneumonia, unspecified organism: Principal | ICD-10-CM | POA: Diagnosis present

## 2018-12-14 DIAGNOSIS — N400 Enlarged prostate without lower urinary tract symptoms: Secondary | ICD-10-CM

## 2018-12-14 DIAGNOSIS — F0390 Unspecified dementia without behavioral disturbance: Secondary | ICD-10-CM | POA: Diagnosis present

## 2018-12-14 DIAGNOSIS — G309 Alzheimer's disease, unspecified: Secondary | ICD-10-CM | POA: Diagnosis not present

## 2018-12-14 DIAGNOSIS — G40909 Epilepsy, unspecified, not intractable, without status epilepticus: Secondary | ICD-10-CM | POA: Diagnosis not present

## 2018-12-14 DIAGNOSIS — Z20828 Contact with and (suspected) exposure to other viral communicable diseases: Secondary | ICD-10-CM | POA: Diagnosis not present

## 2018-12-14 DIAGNOSIS — J984 Other disorders of lung: Secondary | ICD-10-CM | POA: Diagnosis not present

## 2018-12-14 DIAGNOSIS — R509 Fever, unspecified: Secondary | ICD-10-CM | POA: Diagnosis not present

## 2018-12-14 DIAGNOSIS — I10 Essential (primary) hypertension: Secondary | ICD-10-CM | POA: Diagnosis not present

## 2018-12-14 DIAGNOSIS — Z9114 Patient's other noncompliance with medication regimen: Secondary | ICD-10-CM | POA: Diagnosis not present

## 2018-12-14 DIAGNOSIS — R569 Unspecified convulsions: Secondary | ICD-10-CM | POA: Diagnosis not present

## 2018-12-14 DIAGNOSIS — Z1159 Encounter for screening for other viral diseases: Secondary | ICD-10-CM | POA: Diagnosis not present

## 2018-12-14 DIAGNOSIS — Z833 Family history of diabetes mellitus: Secondary | ICD-10-CM | POA: Diagnosis not present

## 2018-12-14 DIAGNOSIS — E119 Type 2 diabetes mellitus without complications: Secondary | ICD-10-CM | POA: Diagnosis present

## 2018-12-14 DIAGNOSIS — R918 Other nonspecific abnormal finding of lung field: Secondary | ICD-10-CM | POA: Diagnosis not present

## 2018-12-14 DIAGNOSIS — E785 Hyperlipidemia, unspecified: Secondary | ICD-10-CM

## 2018-12-14 DIAGNOSIS — F0280 Dementia in other diseases classified elsewhere without behavioral disturbance: Secondary | ICD-10-CM

## 2018-12-14 DIAGNOSIS — Z8249 Family history of ischemic heart disease and other diseases of the circulatory system: Secondary | ICD-10-CM | POA: Diagnosis not present

## 2018-12-14 LAB — CBC WITH DIFFERENTIAL/PLATELET
Abs Immature Granulocytes: 0.04 10*3/uL (ref 0.00–0.07)
Basophils Absolute: 0 10*3/uL (ref 0.0–0.1)
Basophils Relative: 0 %
Eosinophils Absolute: 0 10*3/uL (ref 0.0–0.5)
Eosinophils Relative: 0 %
HCT: 39.9 % (ref 39.0–52.0)
Hemoglobin: 13.1 g/dL (ref 13.0–17.0)
Immature Granulocytes: 0 %
Lymphocytes Relative: 9 %
Lymphs Abs: 1 10*3/uL (ref 0.7–4.0)
MCH: 31.3 pg (ref 26.0–34.0)
MCHC: 32.8 g/dL (ref 30.0–36.0)
MCV: 95.2 fL (ref 80.0–100.0)
Monocytes Absolute: 1.2 10*3/uL — ABNORMAL HIGH (ref 0.1–1.0)
Monocytes Relative: 10 %
Neutro Abs: 9.6 10*3/uL — ABNORMAL HIGH (ref 1.7–7.7)
Neutrophils Relative %: 81 %
Platelets: 189 10*3/uL (ref 150–400)
RBC: 4.19 MIL/uL — ABNORMAL LOW (ref 4.22–5.81)
RDW: 11.4 % — ABNORMAL LOW (ref 11.5–15.5)
WBC: 11.9 10*3/uL — ABNORMAL HIGH (ref 4.0–10.5)
nRBC: 0 % (ref 0.0–0.2)

## 2018-12-14 LAB — URINALYSIS, ROUTINE W REFLEX MICROSCOPIC
Bilirubin Urine: NEGATIVE
Glucose, UA: NEGATIVE mg/dL
Hgb urine dipstick: NEGATIVE
Ketones, ur: NEGATIVE mg/dL
Leukocytes,Ua: NEGATIVE
Nitrite: NEGATIVE
Protein, ur: NEGATIVE mg/dL
Specific Gravity, Urine: 1.018 (ref 1.005–1.030)
pH: 6 (ref 5.0–8.0)

## 2018-12-14 LAB — COMPREHENSIVE METABOLIC PANEL
ALT: 19 U/L (ref 0–44)
AST: 22 U/L (ref 15–41)
Albumin: 4.2 g/dL (ref 3.5–5.0)
Alkaline Phosphatase: 62 U/L (ref 38–126)
Anion gap: 12 (ref 5–15)
BUN: 19 mg/dL (ref 8–23)
CO2: 27 mmol/L (ref 22–32)
Calcium: 9.5 mg/dL (ref 8.9–10.3)
Chloride: 101 mmol/L (ref 98–111)
Creatinine, Ser: 1.83 mg/dL — ABNORMAL HIGH (ref 0.61–1.24)
GFR calc Af Amer: 43 mL/min — ABNORMAL LOW (ref >60)
GFR calc non Af Amer: 37 mL/min — ABNORMAL LOW (ref >60)
Glucose, Bld: 181 mg/dL — ABNORMAL HIGH (ref 70–99)
Potassium: 4.3 mmol/L (ref 3.5–5.1)
Sodium: 140 mmol/L (ref 135–145)
Total Bilirubin: 0.7 mg/dL (ref 0.3–1.2)
Total Protein: 8.3 g/dL — ABNORMAL HIGH (ref 6.5–8.1)

## 2018-12-14 LAB — SARS CORONAVIRUS 2 BY RT PCR (HOSPITAL ORDER, PERFORMED IN ~~LOC~~ HOSPITAL LAB): SARS Coronavirus 2: NEGATIVE

## 2018-12-14 LAB — VALPROIC ACID LEVEL: Valproic Acid Lvl: 67 ug/mL (ref 50.0–100.0)

## 2018-12-14 MED ORDER — ACETAMINOPHEN 325 MG PO TABS: 650.0000 mg | ORAL_TABLET | Freq: Four times a day (QID) | ORAL | Status: DC | PRN

## 2018-12-14 MED ORDER — ACETAMINOPHEN 650 MG RE SUPP: 650.0000 mg | Freq: Four times a day (QID) | RECTAL | Status: DC | PRN

## 2018-12-14 MED ORDER — ACETAMINOPHEN 500 MG PO TABS
1000.0000 mg | ORAL_TABLET | Freq: Once | ORAL | Status: AC
  Administered 2018-12-14: 1000 mg via ORAL
  Filled 2018-12-14: qty 2

## 2018-12-14 MED ORDER — SODIUM CHLORIDE 0.9 % IV BOLUS
1000.0000 mL | Freq: Once | INTRAVENOUS | Status: AC
  Administered 2018-12-14: 1000 mL via INTRAVENOUS

## 2018-12-14 MED ORDER — SODIUM CHLORIDE 0.9 % IV SOLN
500.0000 mg | INTRAVENOUS | Status: DC
  Administered 2018-12-14 – 2018-12-15 (×2): 500 mg via INTRAVENOUS
  Filled 2018-12-14 (×2): qty 500

## 2018-12-14 MED ORDER — LEVETIRACETAM 500 MG PO TABS
500.0000 mg | ORAL_TABLET | Freq: Two times a day (BID) | ORAL | Status: DC
  Administered 2018-12-15 – 2018-12-16 (×3): 500 mg via ORAL
  Filled 2018-12-14 (×3): qty 1

## 2018-12-14 MED ORDER — LEVETIRACETAM IN NACL 1000 MG/100ML IV SOLN
1000.0000 mg | Freq: Once | INTRAVENOUS | Status: AC
  Administered 2018-12-14: 1000 mg via INTRAVENOUS
  Filled 2018-12-14: qty 100

## 2018-12-14 MED ORDER — ENOXAPARIN SODIUM 40 MG/0.4ML ~~LOC~~ SOLN
40.0000 mg | SUBCUTANEOUS | Status: DC
  Administered 2018-12-14 – 2018-12-15 (×2): 40 mg via SUBCUTANEOUS
  Filled 2018-12-14 (×2): qty 0.4

## 2018-12-14 MED ORDER — SODIUM CHLORIDE 0.9 % IV SOLN
1.0000 g | Freq: Once | INTRAVENOUS | Status: AC
  Administered 2018-12-14: 1 g via INTRAVENOUS
  Filled 2018-12-14: qty 10

## 2018-12-14 NOTE — H&P (Addendum)
Date: 12/14/2018               Patient Name:  Tony [name] MRN: [id number]  DOB: 10-Feb-1951 Age / Sex: 68 y.o., male   PCP: Velna Ochs, MD         Medical Service: Internal Medicine Teaching Service         Attending Physician: Dr. Evette Doffing, Mallie Mussel, *    First Contact: Dr. Myrtie Hawk Pager: [phone]  Second Contact: Dr. Shan Levans Pager: [phone]       After Hours (After 5p/  First Contact Pager: [phone]  weekends / holidays): Second Contact Pager: [phone]   Chief Complaint: Seizure  History of Present Illness: 68 year old male with past medical history of seizure disorder, DM 2, HTN, advanced dementia. He was seen in [facility] ED due to having seizure and also found to have fever and transferred to [facility] ED after initial treatment. He is postictal on arrival and very somnolent. He mentions that he was doing good until yesterday that he blacked out. He can not provide further history as he is very somnolence. I talked to his brother, and [name] who is Location manager of [employer] team at [employer]. He mentions that his brother had 3 episodes of seizure 3 night back to back about 2 weeks ago and turned out he did no take his meds in propper dose. He has been seizure free for 8 years prior to that. He did not have any cough, fever or other symptoms except seizure at home. He has had tele visit with [facility] recently and found to be taking 50% less Depakote than prescribed. He had another generalized seizure this morning and sent to [facility] ED and then [facility] to [facility].  Meds:  Current Meds  Medication Sig  . cabergoline (DOSTINEX) 0.5 MG tablet Take 0.5 mg by mouth daily.  . divalproex (DEPAKOTE ER) 500 MG 24 hr tablet Take 2 tablets (1,000 mg total) by mouth daily.  Marland Kitchen donepezil (ARICEPT) 5 MG tablet Take 1 tablet (5 mg total) by mouth at bedtime.  Marland Kitchen lisinopril-hydrochlorothiazide (ZESTORETIC) 20-12.5 MG tablet Take 1 tablet by mouth daily.  . rosuvastatin (CRESTOR)  20 MG tablet Take 1 tablet (20 mg total) by mouth at bedtime.  . tamsulosin (FLOMAX) 0.4 MG CAPS capsule Take 1 capsule (0.4 mg total) by mouth daily after breakfast.     Allergies: Allergies as of 12/14/2018  . (No Known Allergies)   Past Medical History:  Diagnosis Date  . Diabetes mellitus    Type II, diagnosed 2011, not on insulin; admitted in 2011 for hyperglycemia  . HTN (hypertension)   . Seizure disorder ([employer])    diagnosed in childhood, last seizure was years ago    Family History: Unable to obtain Family Hx.  Family History  Problem Relation Age of Onset  . Diabetes Mother   . Hypertension Mother      Social History: Unable to obtain detailed social history as patient was altered in postictal phase to postictal phase, per chart he never smoked, had no history of alcohol or drug use. He lives with his son who has huntington disease.  Review of Systems: A complete ROS was negative except as per HPI.   Physical Exam: Blood pressure 119/62, pulse 68, temperature 98.2 F (36.8 C), temperature source Oral, resp. rate (!) 22, height 6' (1.829 m), weight 108.9 kg, SpO2 99 %. Physical Exam:  VS reviewed, nursing notes reviewed. General: Well-developed, well-nourished,  lying in the bed in no acute distress, is somnolent. CV: RRR, normal S1-S2, no murmur Pulm: Normal work of breathing, no crackles, no wheezing on anterior chest exam Abdomen: Soft and nontender to palpation, BS are present Musculoskeletal: No lower extremity edema, pulses are present Neurologic exam: He is somnolent, is oriented to place, and some confusion, conversant when wakes up obeys some of the comments only, moves all of his extremities, no focal neurologic deficit noted Skin: Warm and non-diaphoretic Psychiatric exam:   EKG: personally reviewed my interpretation is  Assessment & Plan by Problem: Active Problems:   CAP (community acquired pneumonia)  CAP: He presented to [facility]  initially and had fever and neutrophil dominant leukocytosis at 11.9 with CXR finding: with right middle lobe and left lower lobe infiltration, consistent with pneumonia, mostly bacterial. (can also be aspiration PNA in setting) and received 1 dose of ceftriaxone and azithromycin. COVID-19 came back negative.  Seizure: Patient with Hx of seizure and per recent clinic visit, he has been found to be taking 50% less Depakote than prescribed. (He was supposed to take Depakot ER 1000 mg QD) Loaded IV Keppra. 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 -Keppra 500 mg BID starting tomorrow -Neurology consulted and following, appreciate recommedntaion -Valproic acid level -Head CT is pending -NPO  Diet: NPO IV fluid: None VTE ppx: Lovenox Code status: Full  Dispo: Admit patient to Observation with expected length of stay less than 2 midnights.  SignedDewayne Hatch, MD 12/14/2018, 6:11 PM  Pager: [phone]

## 2018-12-14 NOTE — ED Triage Notes (Signed)
Pt's brother providing history and reports pt has hx of seizures 10 years ago and has been seizure free managed on Keppra. Got medications mixed up a few weeks ago and began having seizures again.  Is not followed by neurology at this time.

## 2018-12-14 NOTE — ED Notes (Signed)
Report given to [facility] 

## 2018-12-14 NOTE — ED Notes (Signed)
[facility] here for transport.  

## 2018-12-14 NOTE — Progress Notes (Signed)
Admitting team notified that pt has arrived to 5w09 from [facility]

## 2018-12-14 NOTE — Progress Notes (Signed)
Tony [name] is a 68 y.o. male patient admitted from ED awake, alert - oriented  X3- no acute distress noted.  VSS - Blood pressure 119/62, pulse 68, temperature (!) 100.5 F (38.1 C), temperature source Oral, resp. rate (!) 22, height 6' (1.829 m), weight 108.9 kg, SpO2 99 %.    IV in place, occlusive dsg intact without redness.  Orientation to room, and floor completed with information packet given to patient/family.  Patient declined safety video at this time.  Admission INP armband ID verified with patient/family, and in place.   SR up x 2, fall assessment complete, with patient and family able to verbalize understanding of risk associated with falls, and verbalized understanding to call nsg before up out of bed.  Call light within reach, patient able to voice, and demonstrate understanding.  Skin, clean-dry- intact without evidence of bruising, or skin tears.   No evidence of skin break down noted on exam.     Will cont to eval and treat per MD orders.  Luci Bank, RN 12/14/2018 3:18 PM

## 2018-12-14 NOTE — ED Provider Notes (Addendum)
[facility] EMERGENCY DEPARTMENT Provider Note   CSN: [id number] Arrival date & time: 12/14/18  7741    History   Chief Complaint Chief Complaint  Patient presents with  . Seizures    HPI Tony [name] is a 68 y.o. male.     Level 5 caveat for postictal state.  Most of history obtained from his brother who is an Therapist, sports.  Patient has a longstanding history of seizures.  However, he has been seizure-free for 10 years until approximately 1 month ago when seizures have returned.  He has been taking his antiseizure medicine appropriately.  Patient has no specific complaints.  Fever today.     Past Medical History:  Diagnosis Date  . Diabetes mellitus    Type II, diagnosed 2011, not on insulin; admitted in 2011 for hyperglycemia  . HTN (hypertension)   . Seizure disorder ([place])    diagnosed in childhood, last seizure was years ago    Patient Active Problem List   Diagnosis Date Noted  . Dementia ([place]) 10/20/2018  . Enlarged prostate without lower urinary tract symptoms (luts) 06/30/2018  . Hyperprolactinemia ([place]) 11/20/2017  . HLD (hyperlipidemia) 09/02/2016  . Chronic kidney disease 03/19/2016  . Gynecomastia, male 04/25/2015  . Routine health maintenance 04/25/2015  . Erectile disorder due to medical condition in male patient 05/07/2014  . Hypertension 12/12/2011  . Prediabetes 12/12/2011  . Seizure disorder ([place]) 12/12/2011    History reviewed. No pertinent surgical history.      Home Medications    Prior to Admission medications   Medication Sig Start Date End Date Taking? Authorizing Provider  cabergoline (DOSTINEX) 0.5 MG tablet Take 0.5 mg by mouth daily. 10/24/18  Yes [provider]  divalproex (DEPAKOTE ER) 500 MG 24 hr tablet Take 2 tablets (1,000 mg total) by mouth daily. 10/20/18  Yes Velna Ochs, MD  donepezil (ARICEPT) 5 MG tablet Take 1 tablet (5 mg total) by mouth at bedtime. 10/20/18  Yes Velna Ochs, MD   lisinopril-hydrochlorothiazide (ZESTORETIC) 20-12.5 MG tablet Take 1 tablet by mouth daily. 10/20/18 10/20/19 Yes Velna Ochs, MD  rosuvastatin (CRESTOR) 20 MG tablet Take 1 tablet (20 mg total) by mouth at bedtime. 10/20/18 10/20/19 Yes Velna Ochs, MD  tamsulosin [facility]) 0.4 MG CAPS capsule Take 1 capsule (0.4 mg total) by mouth daily after breakfast. 10/20/18  Yes Velna Ochs, MD    Family History Family History  Problem Relation Age of Onset  . Diabetes Mother   . Hypertension Mother     Social History Social History   Tobacco Use  . Smoking status: Never Smoker  . Smokeless tobacco: Never Used  Substance Use Topics  . Alcohol use: No    Alcohol/week: 0.0 standard drinks  . Drug use: No     Allergies   Patient has no known allergies.   Review of Systems Review of Systems  Unable to perform ROS: Other     Physical Exam Updated Vital Signs BP 128/80   Pulse 75   Temp (!) 100.5 F (38.1 C) (Oral)   Resp 12   Ht 6' (1.829 m)   Wt 108.9 kg   SpO2 96%   BMI 32.55 kg/m   Physical Exam Vitals signs and nursing note reviewed.  Constitutional:      Appearance: He is well-developed.     Comments: No seizure activity noted; minimally confused.  HENT:     Head: Normocephalic and atraumatic.  Eyes:     Conjunctiva/sclera: Conjunctivae normal.  Neck:  Musculoskeletal: Neck supple.  Cardiovascular:     Rate and Rhythm: Normal rate and regular rhythm.  Pulmonary:     Effort: Pulmonary effort is normal.     Breath sounds: Normal breath sounds.  Abdominal:     General: Bowel sounds are normal.     Palpations: Abdomen is soft.  Musculoskeletal: Normal range of motion.  Skin:    General: Skin is warm and dry.  Neurological:     Mental Status: He is alert.     Comments: Moving all 4 extremities.  Conversant.  Psychiatric:        Behavior: Behavior normal.      ED Treatments / Results  Labs (all labs ordered are listed, but only abnormal  results are displayed) Labs Reviewed  CBC WITH DIFFERENTIAL/PLATELET - Abnormal; Notable for the following components:      Result Value   WBC 11.9 (*)    RBC 4.19 (*)    RDW 11.4 (*)    Neutro Abs 9.6 (*)    Monocytes Absolute 1.2 (*)    All other components within normal limits  COMPREHENSIVE METABOLIC PANEL - Abnormal; Notable for the following components:   Glucose, Bld 181 (*)    Creatinine, Ser 1.83 (*)    Total Protein 8.3 (*)    GFR calc non Af Amer 37 (*)    GFR calc Af Amer 43 (*)    All other components within normal limits  URINALYSIS, ROUTINE W REFLEX MICROSCOPIC - Abnormal; Notable for the following components:   APPearance HAZY (*)    All other components within normal limits  CULTURE, BLOOD (ROUTINE X 2)  CULTURE, BLOOD (ROUTINE X 2)  SARS CORONAVIRUS 2 (HOSPITAL ORDER, [place] [facility])  VALPROIC ACID LEVEL    EKG None  Radiology Dg Chest Port 1 View  Result Date: 12/14/2018 CLINICAL DATA:  Fever.  History of seizures. EXAM: PORTABLE CHEST 1 VIEW COMPARISON:  Two-view chest x-ray 02/07/2010. FINDINGS: Low lung volumes exaggerate the heart size. Right middle lobe and left lower lobe airspace opacities are present. Mild pulmonary vascular congestion is present. Visualized soft tissues and bony thorax are unremarkable. IMPRESSION: 1. Right middle lobe and left lower lobe airspace disease. Differential diagnosis includes pneumonia versus aspiration. 2. Low lung volumes and mild pulmonary vascular congestion. Electronically Signed   By: San Morelle M.D.   On: 12/14/2018 09:23    Procedures Procedures (including critical care time)  Medications Ordered in ED Medications  azithromycin (ZITHROMAX) 500 mg in sodium chloride 0.9 % 250 mL IVPB (500 mg Intravenous New Bag/Given 12/14/18 1045)  sodium chloride 0.9 % bolus 1,000 mL (0 mLs Intravenous Stopped 12/14/18 1026)  cefTRIAXone (ROCEPHIN) 1 g in sodium chloride 0.9 % 100 mL IVPB (0 g  Intravenous Stopped 12/14/18 1043)     Initial Impression / Assessment and Plan / ED Course  I have reviewed the triage vital signs and the nursing notes.  Pertinent labs & imaging results that were available during my care of the patient were reviewed by me and considered in my medical decision making (see chart for details).        Patient with known seizure history presents with seizure and fever.  IV fluids, labs, CT head, COVID-19.  Chest x-ray reveals right middle lobe and left lower lobe pneumonia.  Will Rx IV Rocephin, IV Zithromax, waiting for COVID results.  Will admit to general medicine.  COVID negative.  Patient is hemodynamically stable.  1230: Discussed  with Dr. Leonel Ramsay neuro hospitalist.  He recommended 1 g  Keppra load; then Keppra 500 mg twice daily for the short-term.  1245: Discussed with internal medicine attending Dr. Evette Doffing.  He will accept transfer to [facility].  CRITICAL CARE Performed by: Nat Christen Total critical care time: 50 minutes Critical care time was exclusive of separately billable procedures and treating other patients. Critical care was necessary to treat or prevent imminent or life-threatening deterioration. Critical care was time spent personally by me on the following activities: development of treatment plan with patient and/or surrogate as well as nursing, discussions with consultants, evaluation of patient's response to treatment, examination of patient, obtaining history from patient or surrogate, ordering and performing treatments and interventions, ordering and review of laboratory studies, ordering and review of radiographic studies, pulse oximetry and re-evaluation of patient's condition.  Final Clinical Impressions(s) / ED Diagnoses   Final diagnoses:  Seizure ([place])  Fever, unspecified fever cause  Community acquired pneumonia, unspecified laterality    ED Discharge Orders    None       Nat Christen, MD 12/14/18 0930     Nat Christen, MD 12/14/18 [place]    Nat Christen, MD 12/14/18 1149    Nat Christen, MD 12/14/18 1308

## 2018-12-14 NOTE — ED Notes (Signed)
Patient brother [name] updated on status, patient on way to 5W via carelink

## 2018-12-15 ENCOUNTER — Ambulatory Visit: Payer: Self-pay | Admitting: Pharmacist

## 2018-12-15 ENCOUNTER — Other Ambulatory Visit: Payer: Self-pay | Admitting: Pharmacist

## 2018-12-15 ENCOUNTER — Ambulatory Visit: Payer: Self-pay

## 2018-12-15 ENCOUNTER — Other Ambulatory Visit: Payer: Self-pay

## 2018-12-15 DIAGNOSIS — Z833 Family history of diabetes mellitus: Secondary | ICD-10-CM | POA: Diagnosis not present

## 2018-12-15 DIAGNOSIS — G40909 Epilepsy, unspecified, not intractable, without status epilepticus: Secondary | ICD-10-CM | POA: Diagnosis present

## 2018-12-15 DIAGNOSIS — I10 Essential (primary) hypertension: Secondary | ICD-10-CM | POA: Diagnosis present

## 2018-12-15 DIAGNOSIS — Z1159 Encounter for screening for other viral diseases: Secondary | ICD-10-CM | POA: Diagnosis not present

## 2018-12-15 DIAGNOSIS — Z8249 Family history of ischemic heart disease and other diseases of the circulatory system: Secondary | ICD-10-CM | POA: Diagnosis not present

## 2018-12-15 DIAGNOSIS — J189 Pneumonia, unspecified organism: Principal | ICD-10-CM

## 2018-12-15 DIAGNOSIS — R569 Unspecified convulsions: Secondary | ICD-10-CM

## 2018-12-15 DIAGNOSIS — Z9114 Patient's other noncompliance with medication regimen: Secondary | ICD-10-CM | POA: Diagnosis not present

## 2018-12-15 DIAGNOSIS — E119 Type 2 diabetes mellitus without complications: Secondary | ICD-10-CM | POA: Diagnosis present

## 2018-12-15 DIAGNOSIS — G309 Alzheimer's disease, unspecified: Secondary | ICD-10-CM | POA: Diagnosis present

## 2018-12-15 LAB — CBC
HCT: 37.2 % — ABNORMAL LOW (ref 39.0–52.0)
Hemoglobin: 12.1 g/dL — ABNORMAL LOW (ref 13.0–17.0)
MCH: 30.6 pg (ref 26.0–34.0)
MCHC: 32.5 g/dL (ref 30.0–36.0)
MCV: 94.2 fL (ref 80.0–100.0)
Platelets: 155 10*3/uL (ref 150–400)
RBC: 3.95 MIL/uL — ABNORMAL LOW (ref 4.22–5.81)
RDW: 11.3 % — ABNORMAL LOW (ref 11.5–15.5)
WBC: 8.3 10*3/uL (ref 4.0–10.5)
nRBC: 0 % (ref 0.0–0.2)

## 2018-12-15 LAB — GLUCOSE, CAPILLARY
Glucose-Capillary: 103 mg/dL — ABNORMAL HIGH (ref 70–99)
Glucose-Capillary: 85 mg/dL (ref 70–99)
Glucose-Capillary: 122 mg/dL — ABNORMAL HIGH (ref 70–99)
Glucose-Capillary: 96 mg/dL (ref 70–99)

## 2018-12-15 LAB — BASIC METABOLIC PANEL
Anion gap: 9 (ref 5–15)
BUN: 16 mg/dL (ref 8–23)
CO2: 26 mmol/L (ref 22–32)
Calcium: 9 mg/dL (ref 8.9–10.3)
Chloride: 105 mmol/L (ref 98–111)
Creatinine, Ser: 1.66 mg/dL — ABNORMAL HIGH (ref 0.61–1.24)
GFR calc Af Amer: 48 mL/min — ABNORMAL LOW (ref >60)
GFR calc non Af Amer: 41 mL/min — ABNORMAL LOW (ref >60)
Glucose, Bld: 125 mg/dL — ABNORMAL HIGH (ref 70–99)
Potassium: 4.1 mmol/L (ref 3.5–5.1)
Sodium: 140 mmol/L (ref 135–145)

## 2018-12-15 LAB — HIV ANTIBODY (ROUTINE TESTING W REFLEX): HIV Screen 4th Generation wRfx: NONREACTIVE

## 2018-12-15 MED ORDER — DOXYCYCLINE HYCLATE 100 MG PO TABS
100.0000 mg | ORAL_TABLET | Freq: Two times a day (BID) | ORAL | Status: DC
  Administered 2018-12-15 – 2018-12-16 (×2): 100 mg via ORAL
  Filled 2018-12-15 (×2): qty 1

## 2018-12-15 NOTE — Plan of Care (Signed)

## 2018-12-15 NOTE — Progress Notes (Signed)
Internal Medicine Teaching Service Attending:   I saw and examined the patient. I reviewed the resident's note and I agree with the resident's findings and plan as documented in the resident's note.  Principal Problem:   CAP (community acquired pneumonia) Active Problems:   Seizure disorder ([place])   Dementia [facility])  Hospital day #2 for this 68 year old person living independently with dementia, seizure disorder, admitted for community-acquired pneumonia and acute seizure activity.  Having some good clinical improvement on antibiotics.  Okay to transition antibiotics to oral doxycycline.  We are adjusting his antiepileptic regimen, now on Keppra 500 mg twice daily.  Monitor tonight for further seizure activity.  PT and OT recommending home health, I think it would be important to arrange for RN to help with med reconciliation at home.  Working with family, planning for discharge to home tomorrow if no further seizure activity.  Lalla Brothers, MD FACP

## 2018-12-15 NOTE — Patient Outreach (Addendum)
[facility] [place]  12/15/2018  Tony [name] 03/14/1951 [id number]  Reason for referral: medication management  Patient currently admitted to hospital.  Ward is working to transition patient to compliance pill packaging, however patient is not eligible yet based on fill data (insurance).  He will be eligible in June/July.  Brother is currently filling pill box.  Home health RN should be provided on discharge to assist with pill box as well.  Of note, new start Keppra BID was added to current seizure regimen.  Patient also d/c'd home on PO Abx.  PLAN: -I will follow up in the next 3-4 days to assess patient's medications/status   Regina Eck, PharmD, [place]  [phone]

## 2018-12-15 NOTE — Discharge Summary (Addendum)
Name: Tony [name] MRN: [id number] DOB: May 10, 1951 68 y.o. PCP: Velna Ochs, MD  Date of Admission: 12/14/2018  8:35 AM Date of Discharge: 12/16/2018 Attending Physician: Axel Filler, *  Discharge Diagnosis: 1. Principal Problem:   CAP (community acquired pneumonia) Active Problems:   Seizure disorder ([place])   Dementia ([place])   Seizure ([place])   Discharge Medications: Allergies as of 12/16/2018   No Known Allergies     Medication List    TAKE these medications   cabergoline 0.5 MG tablet Commonly known as:  DOSTINEX Take 1 tablet (0.5 mg total) by mouth daily.   divalproex 500 MG 24 hr tablet Commonly known as:  DEPAKOTE ER Take 2 tablets (1,000 mg total) by mouth daily.   donepezil 5 MG tablet Commonly known as:  ARICEPT Take 1 tablet (5 mg total) by mouth at bedtime.   doxycycline 100 MG tablet Commonly known as:  VIBRA-TABS Take 1 tablet (100 mg total) by mouth every 12 (twelve) hours for 6 days.   lisinopril-hydrochlorothiazide 20-12.5 MG tablet Commonly known as:  Zestoretic Take 1 tablet by mouth daily.   rosuvastatin 20 MG tablet Commonly known as:  Crestor Take 1 tablet (20 mg total) by mouth at bedtime.   tamsulosin 0.4 MG Caps capsule Commonly known as:  FLOMAX Take 1 capsule (0.4 mg total) by mouth daily after breakfast.       Disposition and follow-up:   Tony [name] was discharged from [facility] in stable condition.  At the hospital follow up visit please address:  1. Patient presented with seizure likely due to medication non adherence. He has alzheimer dementia and discharged with home PT, OT and home nurse, to assist with medications. Please ensure he is compliant with his medications.  2. Ensure he finish Doxycycline course for CAP 3. Patient with Hx of Hyperprolactinemia, Please recheck Prolactin and ensure he follows up with endocrinologist.  4.  Labs / imaging needed at time of follow-up: CMP,  prolactin  5.  Pending labs/ test needing follow-up: None  Follow-up Appointments:   Hospital Course by problem list: 1. Seizure 68 year old male with past medical history of seizure disorder, DM 2, HTN, prolactinoma, and advanced dementia who presented with seizure. He has been on Depakote and was seizure free for about 8-9 years until 2 weeks ago that when he had seizures 3 nights in a row. He has alzheimer dementia and turned out he did no take his medication in propper dose. He again developed another episode of seizure in the morning of admission. He was initially seen in [facility] and then transferred to [facility]. Head CT scan was unremarkable. He received loading dose of IV Keppra and then 500 mg Keppra BID. No further seizure during hospitalization. He is discharged with his previous antiepileptic medication Depakot 1000 mg BID as well as home nurse for assisting with medication.  2. CAP: Patient was febrile with very mild leukocytosis (WBC:11.9) on arrival. CXR was suggestive of pneumonia. Patient and his family denied any symptoms. Started on Ceftriaxone that then switched to Doxycycline. He is clinically stable and asymptomatic on discharge.    Discharge Vitals:   BP (!) 174/80 (BP Location: Left Arm)   Pulse 76   Temp (!) 97.5 F (36.4 C) (Oral)   Resp 17   Ht 6' (1.829 m)   Wt 108.9 kg   SpO2 95%   BMI 32.55 kg/m   Pertinent Labs, Studies, and Procedures:  CBC  Latest Ref Rng & Units 12/15/2018 12/14/2018 01/17/2015  WBC 4.0 - 10.5 K/uL 8.3 11.9(H) 5.3  Hemoglobin 13.0 - 17.0 g/dL 12.1(L) 13.1 14.4  Hematocrit 39.0 - 52.0 % 37.2(L) 39.9 42.4  Platelets 150 - 400 K/uL 155 189 186   Head CT: 12/14/2018  IMPRESSION: 1. No acute findings. 2. Chronic small vessel ischemic disease and brain atrophy. 3. Bilateral, cerebellar hemisphere infarcts, chronic.  CXR   12/14/2018  IMPRESSION: 1. Right middle lobe and left lower lobe airspace disease. Differential  diagnosis includes pneumonia versus aspiration. 2. Low lung volumes and mild pulmonary vascular congestion.   Electronically  Discharge Instructions: Discharge Instructions    Face-to-face encounter (required for Medicare/Medicaid patients)   Complete by:  As directed    I Cadance Raus certify that this patient is under my care and that I, or a nurse practitioner or physician's assistant working with me, had a face-to-face encounter that meets the physician face-to-face encounter requirements with this patient on 12/15/2018. The encounter with the patient was in whole, or in part for the following medical condition(s) which is the primary reason for home health care (List medical condition): Patient with Alzheimer dementia and seizure disorder. He has recently been noncompliant to his medications and may forget to take them. His son lives with him but he has [place] disease.  Patient needs home nurse for medications assistance, home PT and OT for mobility and safety evaluation and treat at home.   The encounter with the patient was in whole, or in part, for the following medical condition, which is the primary reason for home health care:  Alzheimer dementia, Hx of seizure, needs assistant for medications and disease management   I certify that, based on my findings, the following services are medically necessary home health services:   Nursing Physical therapy     Reason for Medically Necessary Home Health Services:  Skilled Nursing- Changes in Medication/Medication Management   My clinical findings support the need for the above services:  Cognitive impairments, dementia, or mental confusion  that make it unsafe to leave home   Further, I certify that my clinical findings support that this patient is homebound due to:  Unable to leave home safely without assistance   Home Health   Complete by:  As directed    To provide the following care/treatments:   PT OT RN        Signed:  Dewayne Hatch, MD 12/16/2018, 9:32 AM   Pager: [phone]

## 2018-12-15 NOTE — Evaluation (Signed)
Physical Therapy Evaluation Patient Details Name: Tony [name] MRN: [id number] DOB: 07/12/1951 Today's Date: 12/15/2018   History of Present Illness  68 year old male with past medical history of seizure disorder, DM 2, HTN, advanced dementia. He was seen in [facility] ED due to having seizures and also found to have fever and transferred to [facility] ED after initial treatment. CT head negative for acute changes, shows Bilateral, cerebellar hemisphere infarcts, chronic.   Clinical Impression  Pt was able to get up and move around his room with only mild gait instability, min guard assist overall for balance during activity.  He reports his son lives with him full time and he has an Therapist, sports who organizes his meds.  He doesn't drive and enjoys getting outside.  He would benefit from HHPT follow up for at least a home safety evaluation as it is hard to get a sense of if he is at baseline or was better PTA (he reports mowing grass with a push mower).   PT to follow acutely for deficits listed below.      Follow Up Recommendations Home health PT;Supervision for mobility/OOB    Equipment Recommendations  None recommended by PT    Recommendations for Other Services   NA    Precautions / Restrictions Precautions Precautions: Fall Precaution Comments: seizure Restrictions Weight Bearing Restrictions: No      Mobility  Bed Mobility Overal bed mobility: Needs Assistance Bed Mobility: Supine to Sit;Sit to Supine     Supine to sit: Supervision Sit to supine: Min guard   General bed mobility comments: Supervision for safety  Transfers Overall transfer level: Needs assistance Equipment used: None Transfers: Sit to/from Stand Sit to Stand: Min guard         General transfer comment: Min guard assist for safety and balance, pt reaching for external supports.  Reports he has not fallen lately, but stumbles sometimes.  Ambulation/Gait Ambulation/Gait assistance: Min guard Gait Distance  (Feet): 20 Feet Assistive device: None Gait Pattern/deviations: Step-through pattern;Staggering right;Staggering left     General Gait Details: Pt with mildly staggering gait pattern.  Min guard assist for safety and balance.          Balance Overall balance assessment: Needs assistance Sitting-balance support: Feet supported Sitting balance-Leahy Scale: Good     Standing balance support: No upper extremity supported;During functional activity Standing balance-Leahy Scale: Fair Standing balance comment: Tends to reach for support object in standing.  Was able to preform his own peri care with min guard assist (as he bent very far forward to reach his behind).                              Pertinent Vitals/Pain Pain Assessment: No/denies pain    Home Living Family/patient expects to be discharged to:: Private residence Living Arrangements: Children Available Help at Discharge: Family;Personal care attendant(need to confirm 24hr assist) Type of Home: Mobile home Home Access: Stairs to enter Entrance Stairs-Rails: Right;Left;Can reach both Entrance Stairs-Number of Steps: 4 Home Layout: One level Home Equipment: Shower seat Additional Comments: Pt reports an RN comes 2 times per week to arrange his medications in his pill box and check his vitals.     Prior Function Level of Independence: Independent         Comments: pt reports independent with ADL, iADL including lawn work, some cleaning and cooking, reports he doesn't drive; pt does have hx of dementia so unsure  of full accuracy, but appears to be good historian.  Reported the same to PT        Extremity/Trunk Assessment   Upper Extremity Assessment Upper Extremity Assessment: Defer to OT evaluation    Lower Extremity Assessment Lower Extremity Assessment: Overall WFL for tasks assessed(5/5/ per seated MMT)    Cervical / Trunk Assessment Cervical / Trunk Assessment: Normal  Communication    Communication: No difficulties  Cognition Arousal/Alertness: Awake/alert Behavior During Therapy: WFL for tasks assessed/performed Overall Cognitive Status: History of cognitive impairments - at baseline                                 General Comments: pt with hx of dementia per chart review, is somewhat impulsive this session and requires increased multimodal cues to follow simple commands; is oriented       General Comments General comments (skin integrity, edema, etc.): Pt with some mild word finding difficulty, but generally A and O x4        Assessment/Plan    PT Assessment Patient needs continued PT services  PT Problem List Decreased balance;Decreased mobility;Decreased knowledge of use of DME;Decreased knowledge of precautions       PT Treatment Interventions DME instruction;Gait training;Stair training;Functional mobility training;Therapeutic activities;Balance training;Neuromuscular re-education;Therapeutic exercise;Cognitive remediation;Patient/family education    PT Goals (Current goals can be found in the Care Plan section)  Acute Rehab PT Goals Patient Stated Goal: wants to maintain his independence, go home as soon as they will let him.   PT Goal Formulation: With patient Time For Goal Achievement: 12/29/18 Potential to Achieve Goals: Good    Frequency Min 3X/week           AM-PAC PT "6 Clicks" Mobility  Outcome Measure Help needed turning from your back to your side while in a flat bed without using bedrails?: None Help needed moving from lying on your back to sitting on the side of a flat bed without using bedrails?: A Little Help needed moving to and from a bed to a chair (including a wheelchair)?: A Little Help needed standing up from a chair using your arms (e.g., wheelchair or bedside chair)?: A Little Help needed to walk in hospital room?: A Little Help needed climbing 3-5 steps with a railing? : A Little 6 Click Score: 19    End of  Session Equipment Utilized During Treatment: Gait belt Activity Tolerance: Patient tolerated treatment well Patient left: in chair;with call bell/phone within reach;with chair alarm set   PT Visit Diagnosis: Difficulty in walking, not elsewhere classified (R26.2)    Time: 1583-0940 PT Time Calculation (min) (ACUTE ONLY): 21 min   Charges:          Wells Guiles B. Janazia Schreier, PT, DPT  Acute Rehabilitation #[phone] pager #[phone] office   PT Evaluation $PT Eval Moderate Complexity: 1 Mod          12/15/2018, 3:20 PM

## 2018-12-15 NOTE — Consult Note (Signed)
   [facility] CM Inpatient Consult   12/15/2018  LYMAN BALINGIT 1950-12-09 [id number]   Made aware from [place] Management team of patient;s hospitalization. Patient with [facility] Medicare/Medicaid. Patient is currently active with [place] [facility] for chronic disease management services.  Patient has been engaged by a [place] Management Coordinator, [facility] social worker, and [facility] Pharmacist.  Our community based plan of care has focused on disease management and community resource support.  Patient will receive a post hospital call and will be evaluated for assessments and disease process education.    Chart review of MD History and physical as follows 12/14/2018:  Patient is a 68 year old person living with seizure disorder, cognitive impairment, admitted to the hospital with community-acquired pneumonia.  I agree with antibiotic treatment, would avoid beta lactams given his seizure disorder, would favor doxycycline and azithromycin.  Patient has acute on chronic seizures as well, previously well controlled.  On talking with him I think there is a lot of confusion about his medications at home.  He is unable to tell me what he takes, what the doses, or the schedule.  I agree with changing to [place] for seizure treatment.  Will follow up with community regarding ongoing medication management needs.Spoke with the patient via hospital telephone.  HIPAA verified.  Patient states he would  appreciates the help with understanding his medications Notified Inpatient [facility] team member to make aware that better.  He endorses ongoing follow up.  [facility] Care Management following. Of note, [facility] Care Management services does not replace or interfere with any services that are needed or arranged by inpatient case management or social work.  For additional questions or referrals please contact:  Natividad Brood, RN BSN [facility] Liaison  [phone] business mobile phone Toll free office  [phone]  Fax number: [phone] [email or web address]

## 2018-12-15 NOTE — Evaluation (Addendum)
Occupational Therapy Evaluation Patient Details Name: Tony [name] MRN: [id number] DOB: 05/05/1951 Today's Date: 12/15/2018    History of Present Illness 68 year old male with past medical history of seizure disorder, DM 2, HTN, advanced dementia. He was seen in [facility] ED due to having seizures and also found to have fever and transferred to [facility] ED after initial treatment. CT head negative for acute changes, shows Bilateral, cerebellar hemisphere infarcts, chronic.    Clinical Impression   This 68 y/o male presents with the above. PTA pt reports he was independent with ADL and functional mobility. Pt somewhat impulsive this session and requires multimodal cues for safety and for following commands throughout. Pt performing room level mobility without AD and minA, he currently requires minguard-minA for seated UB ADL, min-modA for LB and toileting ADL. Pt reports he lives with his son and has additional family support close by. He will benefit from continued acute OT services; feel pt will require 24hr supervision/assist if returning home at time of discharge as pt is currently at a higher risk for falls. If 24hr assist available recommend additional [place] therapy services to maximize his safety and independence with ADL and mobility. Will follow.     Follow Up Recommendations  Home health OT;Supervision/Assistance - 24 hour    Equipment Recommendations  None recommended by OT    Recommendations for Other Services       Precautions / Restrictions Precautions Precautions: Fall Precaution Comments: seizure Restrictions Weight Bearing Restrictions: No      Mobility Bed Mobility Overal bed mobility: Needs Assistance Bed Mobility: Supine to Sit;Sit to Supine     Supine to sit: Min guard Sit to supine: Min guard   General bed mobility comments: for lines, safety  Transfers Overall transfer level: Needs assistance Equipment used: None Transfers: Sit to/from Stand Sit to  Stand: Min guard;Min assist         General transfer comment: intermittent minA for steadying upon standing; pt performed multiple sit<>stands during session    Balance Overall balance assessment: Needs assistance Sitting-balance support: Feet supported Sitting balance-Leahy Scale: Good     Standing balance support: No upper extremity supported;During functional activity Standing balance-Leahy Scale: Fair                             ADL either performed or assessed with clinical judgement   ADL Overall ADL's : Needs assistance/impaired     Grooming: Set up;Min guard;Sitting;Wash/dry hands   Upper Body Bathing: Min guard;Minimal assistance;Sitting   Lower Body Bathing: Minimal assistance;Sit to/from stand   Upper Body Dressing : Set up;Min guard;Sitting   Lower Body Dressing: Minimal assistance;Sit to/from stand Lower Body Dressing Details (indicate cue type and reason): pt able to don socks using figure 4 technique whiel seated EOB Toilet Transfer: Minimal assistance;Ambulation Toilet Transfer Details (indicate cue type and reason): simulated via transfer to/from EOB Toileting- Clothing Manipulation and Hygiene: Moderate assistance;Sit to/from stand Toileting - Clothing Manipulation Details (indicate cue type and reason): pt able to perform some peri-care on his own, noted incontinence upon arrival to session of bowel and bladder; provided assist for thorughness for peri-care; pt standing to urinate at toilet in bathroom with minA for balance; once returned to bed pt again had bowel movement requiring additional assist to address care      Functional mobility during ADLs: Minimal assistance       Vision  Perception     Praxis      Pertinent Vitals/Pain Pain Assessment: No/denies pain     Hand Dominance     Extremity/Trunk Assessment             Communication Communication Communication: No difficulties   Cognition Arousal/Alertness:  Awake/alert Behavior During Therapy: WFL for tasks assessed/performed Overall Cognitive Status: History of cognitive impairments - at baseline                                 General Comments: pt with hx of dementia per chart review, is somewhat impulsive this session and requires increased multimodal cues to follow simple commands; is oriented    General Comments       Exercises     Shoulder Instructions      Home Living Family/patient expects to be discharged to:: Private residence Living Arrangements: Children Available Help at Discharge: Family(need to confirm 24hr assist) Type of Home: Mobile home Home Access: Stairs to enter Entrance Stairs-Number of Steps: 1 Entrance Stairs-Rails: Right;Left;Can reach both Home Layout: One level     Bathroom Shower/Tub: Tub only   Biochemist, clinical: Standard     Home Equipment: Shower seat          Prior Functioning/Environment Level of Independence: Independent        Comments: pt reports independent with ADL, iADL including lawn work, some cleaning and cooking, reports he doesn't drive; pt does have hx of dementia so unsure of full accuracy, but appears to be good historian         OT Problem List: Decreased strength;Decreased range of motion;Decreased activity tolerance;Impaired balance (sitting and/or standing);Decreased cognition;Decreased safety awareness;Pain      OT Treatment/Interventions: Self-care/ADL training;Therapeutic exercise;Energy conservation;DME and/or AE instruction;Therapeutic activities;Patient/family education;Balance training    OT Goals(Current goals can be found in the care plan section) Acute Rehab OT Goals Patient Stated Goal: wants to maintain his independence OT Goal Formulation: With patient Time For Goal Achievement: 12/29/18 Potential to Achieve Goals: Good  OT Frequency: Min 2X/week   Barriers to D/C:            Co-evaluation              AM-PAC OT "6 Clicks"  Daily Activity     Outcome Measure Help from another person eating meals?: None Help from another person taking care of personal grooming?: A Little Help from another person toileting, which includes using toliet, bedpan, or urinal?: A Lot Help from another person bathing (including washing, rinsing, drying)?: A Lot Help from another person to put on and taking off regular upper body clothing?: A Little Help from another person to put on and taking off regular lower body clothing?: A Lot 6 Click Score: 16   End of Session Equipment Utilized During Treatment: Gait belt Nurse Communication: Mobility status  Activity Tolerance: Patient tolerated treatment well Patient left: in bed;with call bell/phone within reach;with bed alarm set  OT Visit Diagnosis: Muscle weakness (generalized) (M62.81);Unsteadiness on feet (R26.81)                Time: 1111-1202 OT Time Calculation (min): 51 min Charges:  OT General Charges $OT Visit: 1 Visit OT Evaluation $OT Eval Moderate Complexity: 1 Mod OT Treatments $Self Care/Home Management : 23-37 mins  Lou Cal, OT Supplemental Rehabilitation Services Pager [phone] Office [place] 12/15/2018, 2:15 PM

## 2018-12-15 NOTE — Progress Notes (Addendum)
   Subjective: Tony [name] is alert and communicates well. Denies any complaints. No acute event overnight.   Objective:  Vital signs in last 24 hours: Vitals:   12/14/18 1748 12/14/18 2151 12/15/18 0553 12/15/18 1426  BP:  130/67 121/71 126/72  Pulse:  (!) 56 (!) 55 (!) 51  Resp:  18 18 17   Temp: 98.2 F (36.8 C) 98.4 F (36.9 C) (!) 97.3 F (36.3 C) 97.6 F (36.4 C)  TempSrc: Oral Oral Oral Oral  SpO2:  98% 98% 99%  Weight:      Height:       Ph/E: Genreal: NAD EYES: EOM nl Pulm exam: CTA bilaterally, no crcakles Neurology exam: Alert and at baseline. No focal deficit  Assessment/Plan:  Principal Problem:   CAP (community acquired pneumonia) Active Problems:   Seizure disorder ([place])   Dementia ([place])  Seizure: Patient with Hx of seizure and per recent clinic visit, he has been found to be taking 50% less Depakote than prescribed. (He was supposed to take Depakot ER 1000 mg QD) Loaded IV Keppra.\CT scan is normal. His seizure episode seems to be due to noncompliance to Valporic acid and also infection (PNA).  -Will DC home with home OT and home nurse to assist with taking home meds -DC with Keppra or previous dose pf Valporic acid. (He has been seizure free for years while he took Depakot per his PCP Dr. Philipp Ovens, will continue that after DC ) -F/u with neurologist outpatient  CAP: Doing well clinically.  -DC Ceftriaxone due to los seizure threshold -DC with Doxycycline    Dispo: Anticipated discharge today after PT evaluation  Dewayne Hatch, MD 12/15/2018, 3:01 PM Pager: [phone]

## 2018-12-15 NOTE — Patient Outreach (Signed)
[facility]) Care Management  12/15/2018  Tony [name] 05-Sep-1950 [id number]   Patient linked to [place] for meal delivery.  No other social work needs identified.  BSW closing case.  Ronn Melena, BSW Social Worker [phone]

## 2018-12-16 LAB — GLUCOSE, CAPILLARY
Glucose-Capillary: 114 mg/dL — ABNORMAL HIGH (ref 70–99)
Glucose-Capillary: 141 mg/dL — ABNORMAL HIGH (ref 70–99)

## 2018-12-16 MED ORDER — DIVALPROEX SODIUM ER 500 MG PO TB24: 1000.0000 mg | ORAL_TABLET | Freq: Every day | ORAL | 3 refills | Status: DC

## 2018-12-16 MED ORDER — ROSUVASTATIN CALCIUM 20 MG PO TABS: 20.0000 mg | ORAL_TABLET | Freq: Every day | ORAL | 3 refills | Status: DC

## 2018-12-16 MED ORDER — LISINOPRIL-HYDROCHLOROTHIAZIDE 20-12.5 MG PO TABS: 1.0000 | ORAL_TABLET | Freq: Every day | ORAL | 3 refills | Status: DC

## 2018-12-16 MED ORDER — CABERGOLINE 0.5 MG PO TABS: 0.5000 mg | ORAL_TABLET | Freq: Every day | ORAL | 0 refills | Status: DC

## 2018-12-16 MED ORDER — DONEPEZIL HCL 5 MG PO TABS: 5.0000 mg | ORAL_TABLET | Freq: Every day | ORAL | 3 refills | Status: DC

## 2018-12-16 MED ORDER — DOXYCYCLINE HYCLATE 100 MG PO TABS: 100.0000 mg | ORAL_TABLET | Freq: Two times a day (BID) | ORAL | 0 refills | Status: AC

## 2018-12-16 MED ORDER — TAMSULOSIN HCL 0.4 MG PO CAPS: 0.4000 mg | ORAL_CAPSULE | Freq: Every day | ORAL | 2 refills | Status: AC

## 2018-12-16 MED FILL — DOXYCYCLINE HYCLATE 100 MG: 100 | 6 days supply | Qty: 12 | Fill #0

## 2018-12-16 MED FILL — DIVALPROEX SOD DR 500 MG TA: 500 | 30 days supply | Qty: 60 | Fill #0 | Status: TO

## 2018-12-16 NOTE — Progress Notes (Signed)
Physical Therapy Treatment Patient Details Name: Tony [name] MRN: [id number] DOB: 03/18/1951 Today's Date: 12/16/2018    History of Present Illness 68 year old male with past medical history of seizure disorder, DM 2, HTN, advanced dementia. He was seen in [facility] ED due to having seizures and also found to have fever and transferred to [facility] ED after initial treatment. CT head negative for acute changes, shows Bilateral, cerebellar hemisphere infarcts, chronic.     PT Comments    Pt with improvement in mobility today, ambulated 200' without AD with occasional min A for unsteadiness with turning. Pt repeats self frequently and was found to be soiled while sitting in chair and seemed unaware, unsure whether this is his baseline with dementia. VSS during session. PT will continue to follow.    Follow Up Recommendations  Home health PT;Supervision for mobility/OOB     Equipment Recommendations  None recommended by PT    Recommendations for Other Services       Precautions / Restrictions Precautions Precautions: Fall Precaution Comments: seizure Restrictions Weight Bearing Restrictions: No    Mobility  Bed Mobility               General bed mobility comments: pt received in chair  Transfers Overall transfer level: Needs assistance Equipment used: None;Rolling walker (2 wheeled) Transfers: Sit to/from Stand Sit to Stand: Supervision         General transfer comment: supervision from chair and toilet, vc's for controlling sitting  Ambulation/Gait Ambulation/Gait assistance: Min guard;Min assist Gait Distance (Feet): 200 Feet Assistive device: None;Rolling walker (2 wheeled) Gait Pattern/deviations: Step-through pattern;Wide base of support   Gait velocity interpretation: 1.31 - 2.62 ft/sec, indicative of limited community ambulator General Gait Details: began ambulation with RW but pt not familiar with it and made him more unsafe. Min-guard A for the most  part but occasional mild LOB with turning, given min A for safety but corrected on his own. Frequently states that his mild stagger is normal for him because he's a "country boyInvestment banker, corporate [place] (Stroke Patients Only)       Balance Overall balance assessment: Needs assistance Sitting-balance support: Feet supported Sitting balance-Leahy Scale: Good     Standing balance support: No upper extremity supported;During functional activity Standing balance-Leahy Scale: Good Standing balance comment: stable with reaching activities but limitations evident when stepping and using upper body simultaneously                            Cognition Arousal/Alertness: Awake/alert Behavior During Therapy: WFL for tasks assessed/performed Overall Cognitive Status: History of cognitive impairments - at baseline                                 General Comments: follows commands STM deficits evident in conversation      Exercises General Exercises - Lower Extremity Ankle Circles/Pumps: AROM;Both;15 reps;Seated Long Arc Quad: AROM;Both;10 reps;Seated Hip Flexion/Marching: AROM;Both;10 reps;Seated    General Comments        Pertinent Vitals/Pain Pain Assessment: No/denies pain    Home Living                      Prior Function            PT Goals (  current goals can now be found in the care plan section) Acute Rehab PT Goals Patient Stated Goal: wants to maintain his independence, go home as soon as they will let him.   PT Goal Formulation: With patient Time For Goal Achievement: 12/29/18 Potential to Achieve Goals: Good Progress towards PT goals: Progressing toward goals    Frequency    Min 3X/week      PT Plan Current plan remains appropriate    Co-evaluation              AM-PAC PT "6 Clicks" Mobility   Outcome Measure  Help needed turning from your back to your side while in a  flat bed without using bedrails?: None Help needed moving from lying on your back to sitting on the side of a flat bed without using bedrails?: A Little Help needed moving to and from a bed to a chair (including a wheelchair)?: A Little Help needed standing up from a chair using your arms (e.g., wheelchair or bedside chair)?: A Little Help needed to walk in hospital room?: A Little Help needed climbing 3-5 steps with a railing? : A Little 6 Click Score: 19    End of Session Equipment Utilized During Treatment: Gait belt Activity Tolerance: Patient tolerated treatment well Patient left: in chair;with call bell/phone within reach;with chair alarm set Nurse Communication: Mobility status PT Visit Diagnosis: Difficulty in walking, not elsewhere classified (R26.2)     Time: 1041-1100 PT Time Calculation (min) (ACUTE ONLY): 19 min  Charges:  $Gait Training: 8-22 mins                     Leighton Roach, [place]  Pager [phone] Office [place] 12/16/2018, 1:22 PM

## 2018-12-16 NOTE — Progress Notes (Addendum)
Patient alert oriented x3, assisted with ADLs, removed PIV. Reviewed AVS discharge instructions. Medications sent home with patient. Assisted via wheelchair for discharge.

## 2018-12-16 NOTE — TOC Transition Note (Signed)
Transition of Care [facility]) - CM/SW Discharge Note   Patient Details  Name: Tony [name] MRN: [id number] Date of Birth: [birth year]  Transition of Care [facility]) CM/SW Contact:  Sharin Mons, RN Phone Number: 12/16/2018, 2:37 PM   Clinical Narrative:  Admitted with CAP/seizures. Hx of seizure disorder, DM 2, HTN, advanced dementia. Pt will transition to home, home health services to resume. Active with [facility]. From home with 60 y/o son. Lives in a trailer. States son has Huntington's disease. Wife deceased. PTA Independent with ADL's, no DME usage.  TOC to deliver Rx meds to bedside prior to discharge. Pt has transportation to home.   PCP: Velna Ochs  Final next level of care: [place] Barriers to Discharge: Barriers Resolved   Patient Goals and CMS Choice Patient states their goals for this hospitalization and ongoing recovery are:: go home and get better CMS Medicare.gov Compare Post Acute Care list provided to:: Patient Choice offered to / list presented to : Patient  Discharge Placement              Discharge Plan and Services In-house Referral: [facility](Active with [facility]) Discharge Planning Services: CM Consult Post Acute Care Choice: Home Health          DME Arranged: N/A DME Agency: NA       HH Arranged: PT, RN, OT [place] Agency: [facility] (formerly [employer]) Date [place]: 12/16/18 Time [place]: 1254 Representative spoke with at [place]: [place]) Interventions     Readmission Risk Interventions No flowsheet data found.

## 2018-12-17 ENCOUNTER — Other Ambulatory Visit: Payer: Self-pay

## 2018-12-17 NOTE — Patient Outreach (Signed)
[facility]) Care Management  12/17/2018  Tony [name] 04/15/1951 [id number]   Per chart review, [name] was recently hospitalized for suspected community-acquired pneumonia (CAP) and seizures. He was discharged on yesterday.  Successful outreach with [name] brother Tony [name]. Confirmed resumption of home health services with [facility]. Reports [name] symptoms have not worsened since discharge, but his activity tolerance has slightly declined. Provided update regarding Aging, Disability and Transit Services (ADTS) of [place]. The agency will provide additional assistance in the home as needed. Per my discussion with Tony [name], the required application will be mailed today. Anticipating start of services in June. Tony [name] is agreeable to further outreach from ADTS as well as services through [facility].   PLAN -Will continue to assist with community resources. -Will remain engaged until enrollment is confirmed. -Will continue updates with  Tony [name] as needed.    Tony [name] [phone]

## 2018-12-18 DIAGNOSIS — R351 Nocturia: Secondary | ICD-10-CM | POA: Diagnosis not present

## 2018-12-18 DIAGNOSIS — E119 Type 2 diabetes mellitus without complications: Secondary | ICD-10-CM | POA: Diagnosis not present

## 2018-12-18 DIAGNOSIS — I10 Essential (primary) hypertension: Secondary | ICD-10-CM | POA: Diagnosis not present

## 2018-12-18 DIAGNOSIS — E785 Hyperlipidemia, unspecified: Secondary | ICD-10-CM | POA: Diagnosis not present

## 2018-12-18 DIAGNOSIS — F0280 Dementia in other diseases classified elsewhere without behavioral disturbance: Secondary | ICD-10-CM | POA: Diagnosis not present

## 2018-12-18 DIAGNOSIS — G40909 Epilepsy, unspecified, not intractable, without status epilepticus: Secondary | ICD-10-CM | POA: Diagnosis not present

## 2018-12-18 DIAGNOSIS — N401 Enlarged prostate with lower urinary tract symptoms: Secondary | ICD-10-CM | POA: Diagnosis not present

## 2018-12-18 DIAGNOSIS — G309 Alzheimer's disease, unspecified: Secondary | ICD-10-CM | POA: Diagnosis not present

## 2018-12-19 LAB — CULTURE, BLOOD (ROUTINE X 2)
Culture: NO GROWTH
Culture: NO GROWTH
Special Requests: ADEQUATE
Special Requests: ADEQUATE

## 2018-12-20 ENCOUNTER — Encounter: Payer: Self-pay | Admitting: Internal Medicine

## 2018-12-21 DIAGNOSIS — I10 Essential (primary) hypertension: Secondary | ICD-10-CM | POA: Diagnosis not present

## 2018-12-21 DIAGNOSIS — N401 Enlarged prostate with lower urinary tract symptoms: Secondary | ICD-10-CM | POA: Diagnosis not present

## 2018-12-21 DIAGNOSIS — E119 Type 2 diabetes mellitus without complications: Secondary | ICD-10-CM | POA: Diagnosis not present

## 2018-12-21 DIAGNOSIS — R351 Nocturia: Secondary | ICD-10-CM | POA: Diagnosis not present

## 2018-12-21 DIAGNOSIS — E785 Hyperlipidemia, unspecified: Secondary | ICD-10-CM | POA: Diagnosis not present

## 2018-12-21 DIAGNOSIS — G40909 Epilepsy, unspecified, not intractable, without status epilepticus: Secondary | ICD-10-CM | POA: Diagnosis not present

## 2018-12-21 DIAGNOSIS — F0280 Dementia in other diseases classified elsewhere without behavioral disturbance: Secondary | ICD-10-CM | POA: Diagnosis not present

## 2018-12-21 DIAGNOSIS — G309 Alzheimer's disease, unspecified: Secondary | ICD-10-CM | POA: Diagnosis not present

## 2018-12-23 ENCOUNTER — Ambulatory Visit: Payer: Medicare HMO

## 2018-12-23 DIAGNOSIS — N401 Enlarged prostate with lower urinary tract symptoms: Secondary | ICD-10-CM | POA: Diagnosis not present

## 2018-12-23 DIAGNOSIS — E119 Type 2 diabetes mellitus without complications: Secondary | ICD-10-CM | POA: Diagnosis not present

## 2018-12-23 DIAGNOSIS — E785 Hyperlipidemia, unspecified: Secondary | ICD-10-CM | POA: Diagnosis not present

## 2018-12-23 DIAGNOSIS — G309 Alzheimer's disease, unspecified: Secondary | ICD-10-CM | POA: Diagnosis not present

## 2018-12-23 DIAGNOSIS — F0280 Dementia in other diseases classified elsewhere without behavioral disturbance: Secondary | ICD-10-CM | POA: Diagnosis not present

## 2018-12-23 DIAGNOSIS — R351 Nocturia: Secondary | ICD-10-CM | POA: Diagnosis not present

## 2018-12-23 DIAGNOSIS — G40909 Epilepsy, unspecified, not intractable, without status epilepticus: Secondary | ICD-10-CM | POA: Diagnosis not present

## 2018-12-23 DIAGNOSIS — I10 Essential (primary) hypertension: Secondary | ICD-10-CM | POA: Diagnosis not present

## 2018-12-24 ENCOUNTER — Ambulatory Visit: Payer: Self-pay

## 2018-12-24 DIAGNOSIS — N401 Enlarged prostate with lower urinary tract symptoms: Secondary | ICD-10-CM | POA: Diagnosis not present

## 2018-12-24 DIAGNOSIS — R351 Nocturia: Secondary | ICD-10-CM | POA: Diagnosis not present

## 2018-12-24 DIAGNOSIS — E785 Hyperlipidemia, unspecified: Secondary | ICD-10-CM | POA: Diagnosis not present

## 2018-12-24 DIAGNOSIS — E119 Type 2 diabetes mellitus without complications: Secondary | ICD-10-CM | POA: Diagnosis not present

## 2018-12-24 DIAGNOSIS — G40909 Epilepsy, unspecified, not intractable, without status epilepticus: Secondary | ICD-10-CM | POA: Diagnosis not present

## 2018-12-24 DIAGNOSIS — F0280 Dementia in other diseases classified elsewhere without behavioral disturbance: Secondary | ICD-10-CM | POA: Diagnosis not present

## 2018-12-24 DIAGNOSIS — G309 Alzheimer's disease, unspecified: Secondary | ICD-10-CM | POA: Diagnosis not present

## 2018-12-24 DIAGNOSIS — I10 Essential (primary) hypertension: Secondary | ICD-10-CM | POA: Diagnosis not present

## 2018-12-26 ENCOUNTER — Other Ambulatory Visit: Payer: Self-pay

## 2018-12-26 NOTE — Patient Outreach (Signed)
[facility]) Care Management  12/26/2018  Tony [name] [birth year] [id number]   Follow-up regarding community resources. Tony [name] is pending start of services with [facility] next week. Per [facility] Intake Coordinator, Lattie Haw, a request was submitted for member to attend the [facility] center 5 days a week. Approval is pending.    [name] reports that Tony [name] has progressed well since discharge. He has not experienced seizures or worsening respiratory symptoms since returning home. Agreeable to follow up outreach when enrollment is complete.  PLAN -Will follow up with [facility] Intake Coordinator next week.  [place] [phone]

## 2018-12-29 ENCOUNTER — Ambulatory Visit: Payer: Self-pay | Admitting: Pharmacist

## 2018-12-29 ENCOUNTER — Telehealth: Payer: Self-pay | Admitting: Internal Medicine

## 2018-12-29 ENCOUNTER — Other Ambulatory Visit: Payer: Self-pay | Admitting: Pharmacist

## 2018-12-29 DIAGNOSIS — R351 Nocturia: Secondary | ICD-10-CM | POA: Diagnosis not present

## 2018-12-29 DIAGNOSIS — E785 Hyperlipidemia, unspecified: Secondary | ICD-10-CM | POA: Diagnosis not present

## 2018-12-29 DIAGNOSIS — G40909 Epilepsy, unspecified, not intractable, without status epilepticus: Secondary | ICD-10-CM | POA: Diagnosis not present

## 2018-12-29 DIAGNOSIS — G309 Alzheimer's disease, unspecified: Secondary | ICD-10-CM | POA: Diagnosis not present

## 2018-12-29 DIAGNOSIS — I129 Hypertensive chronic kidney disease with stage 1 through stage 4 chronic kidney disease, or unspecified chronic kidney disease: Secondary | ICD-10-CM | POA: Diagnosis not present

## 2018-12-29 DIAGNOSIS — N401 Enlarged prostate with lower urinary tract symptoms: Secondary | ICD-10-CM | POA: Diagnosis not present

## 2018-12-29 DIAGNOSIS — E1122 Type 2 diabetes mellitus with diabetic chronic kidney disease: Secondary | ICD-10-CM | POA: Diagnosis not present

## 2018-12-29 DIAGNOSIS — F0280 Dementia in other diseases classified elsewhere without behavioral disturbance: Secondary | ICD-10-CM | POA: Diagnosis not present

## 2018-12-29 DIAGNOSIS — N189 Chronic kidney disease, unspecified: Secondary | ICD-10-CM | POA: Diagnosis not present

## 2018-12-29 NOTE — Telephone Encounter (Signed)
[facility] calls for VO 1x week for 9 weeks for medication education and fill pillbox. Do you agree w/ VO

## 2018-12-29 NOTE — Telephone Encounter (Signed)
Agree.  Dr. Philipp Ovens is on vacation.

## 2018-12-29 NOTE — Patient Outreach (Signed)
[facility]) Care Management [place]  12/29/2018  Tony [name] 11/10/1950 [id number]  Reason for referral: medication management  [facility] pharmacy case is being closed due to the following reasons:  The patient is participating in another care management program.  Patient accepted to [facility] program where medication management will be taken care of.    Patient has been provided [facility] CM contact information if assistance needed in the future.    Thank you for allowing [facility] pharmacy to be involved in this patient's care.     Regina Eck, PharmD, [place]  [phone]

## 2018-12-29 NOTE — Telephone Encounter (Signed)
RN calling from [facility] requesting VO.

## 2019-01-02 ENCOUNTER — Other Ambulatory Visit: Payer: Self-pay

## 2019-01-02 NOTE — Patient Outreach (Signed)
[facility]) Care Management  01/02/2019  DEQUON SCHNEBLY 11/10/1950 [id number]    Voice message received from [facility], Intake Coordinator. Attempted to return call and confirm enrollment for Mr. Dimitroff. Anticipated enrollment date was December 29, 2018. Coordinator was not available at the time of the call. Received auto reply that call would be returned.   PLAN -Will follow up on Monday if call is not returned today. -Will follow up with Mr. Demarest' brother, [name] when enrollment is confirmed.   [place] [phone]

## 2019-01-05 DIAGNOSIS — N189 Chronic kidney disease, unspecified: Secondary | ICD-10-CM | POA: Diagnosis not present

## 2019-01-05 DIAGNOSIS — G40909 Epilepsy, unspecified, not intractable, without status epilepticus: Secondary | ICD-10-CM | POA: Diagnosis not present

## 2019-01-05 DIAGNOSIS — E785 Hyperlipidemia, unspecified: Secondary | ICD-10-CM | POA: Diagnosis not present

## 2019-01-05 DIAGNOSIS — R351 Nocturia: Secondary | ICD-10-CM | POA: Diagnosis not present

## 2019-01-05 DIAGNOSIS — F0280 Dementia in other diseases classified elsewhere without behavioral disturbance: Secondary | ICD-10-CM | POA: Diagnosis not present

## 2019-01-05 DIAGNOSIS — E1122 Type 2 diabetes mellitus with diabetic chronic kidney disease: Secondary | ICD-10-CM | POA: Diagnosis not present

## 2019-01-05 DIAGNOSIS — I129 Hypertensive chronic kidney disease with stage 1 through stage 4 chronic kidney disease, or unspecified chronic kidney disease: Secondary | ICD-10-CM | POA: Diagnosis not present

## 2019-01-05 DIAGNOSIS — G309 Alzheimer's disease, unspecified: Secondary | ICD-10-CM | POA: Diagnosis not present

## 2019-01-05 DIAGNOSIS — N401 Enlarged prostate with lower urinary tract symptoms: Secondary | ICD-10-CM | POA: Diagnosis not present

## 2019-01-05 NOTE — Progress Notes (Signed)
Thank you for your help.

## 2019-01-06 ENCOUNTER — Other Ambulatory Visit: Payer: Self-pay

## 2019-01-06 NOTE — Patient Outreach (Signed)
[facility]) Care Management  01/06/2019  Tony [name] 07/05/1951 [id number]   Case Closure Tony [name]' is successfully enrolled with [facility]. He was able to complete the initial assessment on 01/01/19. Per intake coordinator, he will engage in the day program after the COVID-19 restrictions are lifted. Will notify Tony [name] that [facility] has assumed oversight of his care and case management needs.   PLAN -Will update member's brother, Tony [name]. -Will update PCP and complete case closure.  [place] [phone]

## 2019-01-12 DIAGNOSIS — E785 Hyperlipidemia, unspecified: Secondary | ICD-10-CM | POA: Diagnosis not present

## 2019-01-12 DIAGNOSIS — N189 Chronic kidney disease, unspecified: Secondary | ICD-10-CM | POA: Diagnosis not present

## 2019-01-12 DIAGNOSIS — R351 Nocturia: Secondary | ICD-10-CM | POA: Diagnosis not present

## 2019-01-12 DIAGNOSIS — G309 Alzheimer's disease, unspecified: Secondary | ICD-10-CM | POA: Diagnosis not present

## 2019-01-12 DIAGNOSIS — I129 Hypertensive chronic kidney disease with stage 1 through stage 4 chronic kidney disease, or unspecified chronic kidney disease: Secondary | ICD-10-CM | POA: Diagnosis not present

## 2019-01-12 DIAGNOSIS — G40909 Epilepsy, unspecified, not intractable, without status epilepticus: Secondary | ICD-10-CM | POA: Diagnosis not present

## 2019-01-12 DIAGNOSIS — F0280 Dementia in other diseases classified elsewhere without behavioral disturbance: Secondary | ICD-10-CM | POA: Diagnosis not present

## 2019-01-12 DIAGNOSIS — E1122 Type 2 diabetes mellitus with diabetic chronic kidney disease: Secondary | ICD-10-CM | POA: Diagnosis not present

## 2019-01-12 DIAGNOSIS — N401 Enlarged prostate with lower urinary tract symptoms: Secondary | ICD-10-CM | POA: Diagnosis not present

## 2019-04-10 ENCOUNTER — Emergency Department (HOSPITAL_COMMUNITY): Payer: Medicare (Managed Care)

## 2019-04-10 ENCOUNTER — Other Ambulatory Visit: Payer: Self-pay

## 2019-04-10 ENCOUNTER — Encounter (HOSPITAL_COMMUNITY): Payer: Self-pay | Admitting: Emergency Medicine

## 2019-04-10 ENCOUNTER — Inpatient Hospital Stay (HOSPITAL_COMMUNITY)
Admission: EM | Admit: 2019-04-10 | Discharge: 2019-04-15 | DRG: 871 | Disposition: A | Discharge status: Home / Self Care | Payer: Medicare (Managed Care) | Attending: Internal Medicine | Admitting: Internal Medicine

## 2019-04-10 DIAGNOSIS — Z833 Family history of diabetes mellitus: Secondary | ICD-10-CM | POA: Diagnosis not present

## 2019-04-10 DIAGNOSIS — Z20828 Contact with and (suspected) exposure to other viral communicable diseases: Secondary | ICD-10-CM | POA: Diagnosis present

## 2019-04-10 DIAGNOSIS — F0390 Unspecified dementia without behavioral disturbance: Secondary | ICD-10-CM | POA: Diagnosis not present

## 2019-04-10 DIAGNOSIS — E785 Hyperlipidemia, unspecified: Secondary | ICD-10-CM | POA: Diagnosis present

## 2019-04-10 DIAGNOSIS — I10 Essential (primary) hypertension: Secondary | ICD-10-CM | POA: Diagnosis present

## 2019-04-10 DIAGNOSIS — G92 Toxic encephalopathy: Secondary | ICD-10-CM | POA: Diagnosis present

## 2019-04-10 DIAGNOSIS — A4159 Other Gram-negative sepsis: Secondary | ICD-10-CM | POA: Diagnosis present

## 2019-04-10 DIAGNOSIS — Z8249 Family history of ischemic heart disease and other diseases of the circulatory system: Secondary | ICD-10-CM

## 2019-04-10 DIAGNOSIS — N390 Urinary tract infection, site not specified: Secondary | ICD-10-CM | POA: Diagnosis present

## 2019-04-10 DIAGNOSIS — N3001 Acute cystitis with hematuria: Secondary | ICD-10-CM

## 2019-04-10 DIAGNOSIS — Z634 Disappearance and death of family member: Secondary | ICD-10-CM | POA: Diagnosis not present

## 2019-04-10 DIAGNOSIS — N183 Chronic kidney disease, stage 3 (moderate): Secondary | ICD-10-CM | POA: Diagnosis present

## 2019-04-10 DIAGNOSIS — E1122 Type 2 diabetes mellitus with diabetic chronic kidney disease: Secondary | ICD-10-CM | POA: Diagnosis present

## 2019-04-10 DIAGNOSIS — G309 Alzheimer's disease, unspecified: Secondary | ICD-10-CM | POA: Diagnosis present

## 2019-04-10 DIAGNOSIS — R402252 Coma scale, best verbal response, oriented, at arrival to emergency department: Secondary | ICD-10-CM | POA: Diagnosis present

## 2019-04-10 DIAGNOSIS — F0280 Dementia in other diseases classified elsewhere without behavioral disturbance: Secondary | ICD-10-CM | POA: Diagnosis present

## 2019-04-10 DIAGNOSIS — N400 Enlarged prostate without lower urinary tract symptoms: Secondary | ICD-10-CM | POA: Diagnosis present

## 2019-04-10 DIAGNOSIS — G40909 Epilepsy, unspecified, not intractable, without status epilepticus: Secondary | ICD-10-CM | POA: Diagnosis present

## 2019-04-10 DIAGNOSIS — R402142 Coma scale, eyes open, spontaneous, at arrival to emergency department: Secondary | ICD-10-CM | POA: Diagnosis present

## 2019-04-10 DIAGNOSIS — R569 Unspecified convulsions: Secondary | ICD-10-CM

## 2019-04-10 DIAGNOSIS — A419 Sepsis, unspecified organism: Secondary | ICD-10-CM

## 2019-04-10 DIAGNOSIS — R4182 Altered mental status, unspecified: Secondary | ICD-10-CM | POA: Diagnosis present

## 2019-04-10 DIAGNOSIS — I129 Hypertensive chronic kidney disease with stage 1 through stage 4 chronic kidney disease, or unspecified chronic kidney disease: Secondary | ICD-10-CM | POA: Diagnosis present

## 2019-04-10 DIAGNOSIS — R6520 Severe sepsis without septic shock: Secondary | ICD-10-CM | POA: Diagnosis present

## 2019-04-10 DIAGNOSIS — E1165 Type 2 diabetes mellitus with hyperglycemia: Secondary | ICD-10-CM | POA: Diagnosis present

## 2019-04-10 DIAGNOSIS — N401 Enlarged prostate with lower urinary tract symptoms: Secondary | ICD-10-CM | POA: Diagnosis present

## 2019-04-10 DIAGNOSIS — Z820 Family history of epilepsy and other diseases of the nervous system: Secondary | ICD-10-CM

## 2019-04-10 DIAGNOSIS — R402362 Coma scale, best motor response, obeys commands, at arrival to emergency department: Secondary | ICD-10-CM | POA: Diagnosis present

## 2019-04-10 DIAGNOSIS — N179 Acute kidney failure, unspecified: Secondary | ICD-10-CM | POA: Diagnosis present

## 2019-04-10 DIAGNOSIS — Z9114 Patient's other noncompliance with medication regimen: Secondary | ICD-10-CM

## 2019-04-10 DIAGNOSIS — N138 Other obstructive and reflux uropathy: Secondary | ICD-10-CM | POA: Diagnosis present

## 2019-04-10 DIAGNOSIS — A4181 Sepsis due to Enterococcus: Secondary | ICD-10-CM | POA: Diagnosis present

## 2019-04-10 DIAGNOSIS — R338 Other retention of urine: Secondary | ICD-10-CM

## 2019-04-10 DIAGNOSIS — Z79899 Other long term (current) drug therapy: Secondary | ICD-10-CM

## 2019-04-10 DIAGNOSIS — N189 Chronic kidney disease, unspecified: Secondary | ICD-10-CM | POA: Diagnosis present

## 2019-04-10 HISTORY — DX: Benign prostatic hyperplasia without lower urinary tract symptoms: N40.0

## 2019-04-10 HISTORY — DX: Dementia in other diseases classified elsewhere, unspecified severity, without behavioral disturbance, psychotic disturbance, mood disturbance, and anxiety: F02.80

## 2019-04-10 HISTORY — DX: Chronic kidney disease, unspecified: N18.9

## 2019-04-10 HISTORY — DX: Alzheimer's disease, unspecified: G30.9

## 2019-04-10 LAB — LACTIC ACID, PLASMA
Lactic Acid, Venous: 2.6 mmol/L (ref 0.5–1.9)
Lactic Acid, Venous: 2.9 mmol/L (ref 0.5–1.9)

## 2019-04-10 LAB — CBC WITH DIFFERENTIAL/PLATELET
Abs Immature Granulocytes: 0.08 10*3/uL — ABNORMAL HIGH (ref 0.00–0.07)
Basophils Absolute: 0 10*3/uL (ref 0.0–0.1)
Basophils Relative: 0 %
Eosinophils Absolute: 0 10*3/uL (ref 0.0–0.5)
Eosinophils Relative: 0 %
HCT: 41.7 % (ref 39.0–52.0)
Hemoglobin: 13.2 g/dL (ref 13.0–17.0)
Immature Granulocytes: 1 %
Lymphocytes Relative: 8 %
Lymphs Abs: 1.2 10*3/uL (ref 0.7–4.0)
MCH: 31.1 pg (ref 26.0–34.0)
MCHC: 31.7 g/dL (ref 30.0–36.0)
MCV: 98.3 fL (ref 80.0–100.0)
Monocytes Absolute: 1.8 10*3/uL — ABNORMAL HIGH (ref 0.1–1.0)
Monocytes Relative: 12 %
Neutro Abs: 12.3 10*3/uL — ABNORMAL HIGH (ref 1.7–7.7)
Neutrophils Relative %: 79 %
Platelets: 143 10*3/uL — ABNORMAL LOW (ref 150–400)
RBC: 4.24 MIL/uL (ref 4.22–5.81)
RDW: 12.1 % (ref 11.5–15.5)
WBC: 15.5 10*3/uL — ABNORMAL HIGH (ref 4.0–10.5)
nRBC: 0 % (ref 0.0–0.2)

## 2019-04-10 LAB — COMPREHENSIVE METABOLIC PANEL
ALT: 13 U/L (ref 0–44)
AST: 20 U/L (ref 15–41)
Albumin: 3.6 g/dL (ref 3.5–5.0)
Alkaline Phosphatase: 46 U/L (ref 38–126)
Anion gap: 12 (ref 5–15)
BUN: 28 mg/dL — ABNORMAL HIGH (ref 8–23)
CO2: 24 mmol/L (ref 22–32)
Calcium: 8.2 mg/dL — ABNORMAL LOW (ref 8.9–10.3)
Chloride: 105 mmol/L (ref 98–111)
Creatinine, Ser: 2.66 mg/dL — ABNORMAL HIGH (ref 0.61–1.24)
GFR calc Af Amer: 27 mL/min — ABNORMAL LOW (ref >60)
GFR calc non Af Amer: 23 mL/min — ABNORMAL LOW (ref >60)
Glucose, Bld: 129 mg/dL — ABNORMAL HIGH (ref 70–99)
Potassium: 3.8 mmol/L (ref 3.5–5.1)
Sodium: 141 mmol/L (ref 135–145)
Total Bilirubin: 0.9 mg/dL (ref 0.3–1.2)
Total Protein: 7.1 g/dL (ref 6.5–8.1)

## 2019-04-10 LAB — URINALYSIS, ROUTINE W REFLEX MICROSCOPIC
Bilirubin Urine: NEGATIVE
Glucose, UA: NEGATIVE mg/dL
Ketones, ur: NEGATIVE mg/dL
Nitrite: NEGATIVE
Protein, ur: 100 mg/dL — AB
RBC / HPF: >50 RBC/hpf — ABNORMAL HIGH (ref 0–5)
Specific Gravity, Urine: 1.013 (ref 1.005–1.030)
pH: 9 — ABNORMAL HIGH (ref 5.0–8.0)

## 2019-04-10 LAB — PROTIME-INR
INR: 1.2 (ref 0.8–1.2)
Prothrombin Time: 14.6 seconds (ref 11.4–15.2)

## 2019-04-10 LAB — APTT: aPTT: 28 seconds (ref 24–36)

## 2019-04-10 LAB — VALPROIC ACID LEVEL: Valproic Acid Lvl: 70 ug/mL (ref 50.0–100.0)

## 2019-04-10 LAB — SARS CORONAVIRUS 2 BY RT PCR (HOSPITAL ORDER, PERFORMED IN ~~LOC~~ HOSPITAL LAB): SARS Coronavirus 2: NEGATIVE

## 2019-04-10 LAB — CBG MONITORING, ED: Glucose-Capillary: 143 mg/dL — ABNORMAL HIGH (ref 70–99)

## 2019-04-10 MED ORDER — ACETAMINOPHEN 325 MG PO TABS
650.0000 mg | ORAL_TABLET | Freq: Once | ORAL | Status: AC
  Administered 2019-04-10: 650 mg via ORAL
  Filled 2019-04-10: qty 2

## 2019-04-10 MED ORDER — CABERGOLINE 0.5 MG PO TABS
0.5000 mg | ORAL_TABLET | Freq: Every day | ORAL | Status: DC
  Filled 2019-04-10 (×7): qty 1

## 2019-04-10 MED ORDER — ACETAMINOPHEN 325 MG PO TABS
650.0000 mg | ORAL_TABLET | Freq: Once | ORAL | Status: AC
  Administered 2019-04-10: 21:00:00 650 mg via ORAL
  Filled 2019-04-10: qty 2

## 2019-04-10 MED ORDER — SODIUM CHLORIDE 0.9 % IV SOLN
1.0000 g | INTRAVENOUS | Status: DC
  Administered 2019-04-10 – 2019-04-12 (×3): 1 g via INTRAVENOUS
  Filled 2019-04-10 (×3): qty 10

## 2019-04-10 MED ORDER — ACETAMINOPHEN 325 MG PO TABS
650.0000 mg | ORAL_TABLET | Freq: Four times a day (QID) | ORAL | Status: DC | PRN
  Administered 2019-04-11 (×2): 650 mg via ORAL
  Filled 2019-04-10 (×2): qty 2

## 2019-04-10 MED ORDER — SENNA 8.6 MG PO TABS
1.0000 | ORAL_TABLET | Freq: Two times a day (BID) | ORAL | Status: DC
  Administered 2019-04-10 – 2019-04-11 (×2): 8.6 mg via ORAL
  Filled 2019-04-10 (×8): qty 1

## 2019-04-10 MED ORDER — TAMSULOSIN HCL 0.4 MG PO CAPS
0.4000 mg | ORAL_CAPSULE | Freq: Every day | ORAL | Status: DC
  Administered 2019-04-11 – 2019-04-15 (×5): 0.4 mg via ORAL
  Filled 2019-04-10 (×5): qty 1

## 2019-04-10 MED ORDER — HYDROCHLOROTHIAZIDE 12.5 MG PO CAPS
12.5000 mg | ORAL_CAPSULE | Freq: Every day | ORAL | Status: DC
  Administered 2019-04-11: 12.5 mg via ORAL
  Filled 2019-04-10 (×4): qty 1

## 2019-04-10 MED ORDER — DEXTROSE-NACL 5-0.45 % IV SOLN: INTRAVENOUS | Status: DC

## 2019-04-10 MED ORDER — SODIUM CHLORIDE 0.45 % IV SOLN
INTRAVENOUS | Status: DC
  Administered 2019-04-10: 23:00:00 via INTRAVENOUS

## 2019-04-10 MED ORDER — SODIUM CHLORIDE 0.9 % IV BOLUS
1000.0000 mL | Freq: Once | INTRAVENOUS | Status: AC
  Administered 2019-04-10: 1000 mL via INTRAVENOUS

## 2019-04-10 MED ORDER — TRAMADOL HCL 50 MG PO TABS: 50.0000 mg | ORAL_TABLET | Freq: Four times a day (QID) | ORAL | Status: DC | PRN

## 2019-04-10 MED ORDER — LISINOPRIL-HYDROCHLOROTHIAZIDE 20-12.5 MG PO TABS: 1.0000 | ORAL_TABLET | Freq: Every day | ORAL | Status: DC

## 2019-04-10 MED ORDER — ENOXAPARIN SODIUM 40 MG/0.4ML ~~LOC~~ SOLN
40.0000 mg | SUBCUTANEOUS | Status: DC
  Administered 2019-04-10: 40 mg via SUBCUTANEOUS
  Filled 2019-04-10: qty 0.4

## 2019-04-10 MED ORDER — ROSUVASTATIN CALCIUM 20 MG PO TABS
20.0000 mg | ORAL_TABLET | Freq: Every day | ORAL | Status: DC
  Administered 2019-04-10 – 2019-04-14 (×5): 20 mg via ORAL
  Filled 2019-04-10 (×8): qty 1

## 2019-04-10 MED ORDER — LISINOPRIL 10 MG PO TABS
20.0000 mg | ORAL_TABLET | Freq: Every day | ORAL | Status: DC
  Administered 2019-04-11: 20 mg via ORAL
  Filled 2019-04-10: qty 2

## 2019-04-10 MED ORDER — DONEPEZIL HCL 5 MG PO TABS
5.0000 mg | ORAL_TABLET | Freq: Every day | ORAL | Status: DC
  Administered 2019-04-10 – 2019-04-14 (×5): 5 mg via ORAL
  Filled 2019-04-10 (×8): qty 1

## 2019-04-10 MED ORDER — SODIUM CHLORIDE 0.9 % IV BOLUS
1000.0000 mL | Freq: Once | INTRAVENOUS | Status: AC
  Administered 2019-04-10: 17:00:00 1000 mL via INTRAVENOUS

## 2019-04-10 MED ORDER — DIVALPROEX SODIUM ER 500 MG PO TB24
1000.0000 mg | ORAL_TABLET | Freq: Every day | ORAL | Status: DC
  Administered 2019-04-11 – 2019-04-15 (×5): 1000 mg via ORAL
  Filled 2019-04-10 (×5): qty 2

## 2019-04-10 MED ORDER — ACETAMINOPHEN 650 MG RE SUPP: 650.0000 mg | Freq: Four times a day (QID) | RECTAL | Status: DC | PRN

## 2019-04-10 NOTE — ED Notes (Signed)
Patient's brother called and asked to be contacted if patient gets admitted.

## 2019-04-10 NOTE — ED Provider Notes (Signed)
Received patient at shift change.  Patient is a 68 year old male who presents to the emergency department with seizures and altered mental status.  Patient has a history of seizures as well as multiple medical problems.  The patient's family reports the patient had a seizure accompanied by altered mental status and was brought to the emergency department by EMS.  Work-up in progress.  Patient noted to have elevations in lactic acid.  It is believed that the patient probably has a urosepsis.  Urine analysis pending.  Recheck.  Vital signs are within normal limits.  No seizure activity noted.  No exam changes noted.  Urine analysis shows a cloudy yellow specimen with a specific gravity 1.013.  The hemoglobin on dipstick is large.  Is 100 mg protein present.  The leukocyte esterase is elevated moderate.  There is greater than 50 red blood cells and 11-20 white blood cells present.  A culture has been sent to the lab.  Case discussed with [facility] hospitalist.  They will admit the patient.    Lily Kocher, PA-C 04/10/19 2057    Varney Biles, MD 04/12/19 2251

## 2019-04-10 NOTE — ED Triage Notes (Signed)
Per EMS patient from home. Family states patient had seizure and was altered when he came to. Patient brother called and stated that patient has history of seizures and was postictal.   Ems states patient was covered in urine that had "bad smell" to it.

## 2019-04-10 NOTE — ED Provider Notes (Signed)
[facility] EMERGENCY DEPARTMENT Provider Note   CSN: [id number] Arrival date & time: 04/10/19  1317     History   Chief Complaint Chief Complaint  Patient presents with   Altered Mental Status   LEVEL 5 CAVEAT - DEMENTIA  HPI Tony [name] is a 68 y.o. male with PMHx dementia, diabetes, HTN, seizure disorder, CKD, who presents to the ED via EMS for seizure/AMS.  Triage report patient is from home.  Family stated that patient had a seizure and was altered when he came to.  EMS reported that patient was covered in urine and that it had a foul odor to it.  Patient is unsure why he is in the ED today.  He denies having seizure.  Was able to gather more information from brother: Patient lives at home with his son who has Huntington's.  They have a home health nurse that comes 3 times a week.  When she into the house today she found patient on the floor in a post ictal state.  His brother states that patient son will typically call him when he has a seizure but he did not receive a call today.  He states that he was recently diagnosed with dementia and he believes that patient is mixing up his meds.  He states he is either not taking enough or taking too many.        Past Medical History:  Diagnosis Date   Alzheimer's disease ([place])    BPH (benign prostatic hyperplasia)    CKD (chronic kidney disease)    Diabetes mellitus    Type II, diagnosed 2011, not on insulin; admitted in 2011 for hyperglycemia   HTN (hypertension)    Seizure disorder ([place])    diagnosed in childhood, last seizure was years ago    Patient Active Problem List   Diagnosis Date Noted   Seizure ([place]) 12/15/2018   CAP (community acquired pneumonia) 12/14/2018   Dementia ([place]) 10/20/2018   Enlarged prostate without lower urinary tract symptoms (luts) 06/30/2018   Hyperprolactinemia ([place]) 11/20/2017   HLD (hyperlipidemia) 09/02/2016   Chronic kidney disease 03/19/2016   Gynecomastia, male 04/25/2015    Routine health maintenance 04/25/2015   Erectile disorder due to medical condition in male patient 05/07/2014   Hypertension 12/12/2011   Prediabetes 12/12/2011   Seizure disorder ([place]) 12/12/2011    History reviewed. No pertinent surgical history.      Home Medications    Prior to Admission medications   Medication Sig Start Date End Date Taking? Authorizing Provider  cabergoline (DOSTINEX) 0.5 MG tablet Take 1 tablet (0.5 mg total) by mouth daily. 12/16/18  Yes Axel Filler, MD  divalproex (DEPAKOTE ER) 500 MG 24 hr tablet Take 2 tablets (1,000 mg total) by mouth daily. 12/16/18  Yes Masoudi, Elhamalsadat, MD  donepezil (ARICEPT) 5 MG tablet Take 1 tablet (5 mg total) by mouth at bedtime. 12/16/18  Yes Masoudi, Elhamalsadat, MD  lisinopril-hydrochlorothiazide (ZESTORETIC) 20-12.5 MG tablet Take 1 tablet by mouth daily. 12/16/18 12/16/19 Yes Masoudi, Elhamalsadat, MD  rosuvastatin (CRESTOR) 20 MG tablet Take 1 tablet (20 mg total) by mouth at bedtime. 12/16/18 12/16/19 Yes Masoudi, Elhamalsadat, MD  tamsulosin (FLOMAX) 0.4 MG CAPS capsule Take 1 capsule (0.4 mg total) by mouth daily after breakfast. 12/16/18  Yes Masoudi, Elhamalsadat, MD    Family History Family History  Problem Relation Age of Onset   Diabetes Mother    Hypertension Mother     Social History Social History   Tobacco  Use   Smoking status: Never Smoker   Smokeless tobacco: Never Used  Substance Use Topics   Alcohol use: No    Alcohol/week: 0.0 standard drinks   Drug use: No     Allergies   Patient has no known allergies.   Review of Systems Review of Systems  Unable to perform ROS: Dementia  Neurological: Positive for seizures.     Physical Exam Updated Vital Signs BP 128/72 (BP Location: Right Arm)    Pulse (!) 111    Temp (!) 101 F (38.3 C) (Oral)    Resp (!) 25    SpO2 96%   Physical Exam Vitals signs and nursing note reviewed.  Constitutional:      Appearance: He is  not ill-appearing.  HENT:     Head: Normocephalic and atraumatic.     Comments: No racoon's sign or battle's sign. Negative hemotympanum bilaterally. No lesions noted to tongue or oral mucosa.  Eyes:     Extraocular Movements: Extraocular movements intact.     Conjunctiva/sclera: Conjunctivae normal.     Pupils: Pupils are equal, round, and reactive to light.  Neck:     Musculoskeletal: Neck supple.  Cardiovascular:     Rate and Rhythm: Normal rate and regular rhythm.     Pulses: Normal pulses.  Pulmonary:     Effort: Pulmonary effort is normal.     Breath sounds: Normal breath sounds. No wheezing, rhonchi or rales.  Abdominal:     Palpations: Abdomen is soft.     Tenderness: There is no abdominal tenderness. There is no guarding or rebound.  Musculoskeletal:     Comments: No C, T, or L midline spinal tenderness. No tenderness to all joints. Moves all extremities without difficulty. Strength equal in BLE and BUEs. Sensation intact throughout. Good distal pulses.   Skin:    General: Skin is warm and dry.  Neurological:     Mental Status: He is alert.     GCS: GCS eye subscore is 4. GCS verbal subscore is 5. GCS motor subscore is 6.     Comments: Pt alert and oriented to self      ED Treatments / Results  Labs (all labs ordered are listed, but only abnormal results are displayed) Labs Reviewed  LACTIC ACID, PLASMA - Abnormal; Notable for the following components:      Result Value   Lactic Acid, Venous 2.6 (*)    All other components within normal limits  COMPREHENSIVE METABOLIC PANEL - Abnormal; Notable for the following components:   Glucose, Bld 129 (*)    BUN 28 (*)    Creatinine, Ser 2.66 (*)    Calcium 8.2 (*)    GFR calc non Af Amer 23 (*)    GFR calc Af Amer 27 (*)    All other components within normal limits  CBC WITH DIFFERENTIAL/PLATELET - Abnormal; Notable for the following components:   WBC 15.5 (*)    Platelets 143 (*)    Neutro Abs 12.3 (*)    Monocytes  Absolute 1.8 (*)    Abs Immature Granulocytes 0.08 (*)    All other components within normal limits  CBG MONITORING, ED - Abnormal; Notable for the following components:   Glucose-Capillary 143 (*)    All other components within normal limits  CULTURE, BLOOD (ROUTINE X 2)  CULTURE, BLOOD (ROUTINE X 2)  SARS CORONAVIRUS 2 (HOSPITAL ORDER, [place] [facility])  URINE CULTURE  APTT  PROTIME-INR  VALPROIC ACID  LEVEL  LACTIC ACID, PLASMA  URINALYSIS, ROUTINE W REFLEX MICROSCOPIC    EKG EKG Interpretation  Date/Time:  Friday April 10 2019 13:44:19 EDT Ventricular Rate:  109 PR Interval:    QRS Duration: 74 QT Interval:  292 QTC Calculation: 394 R Axis:   13 Text Interpretation:  Sinus tachycardia Abnormal R-wave progression, early transition When compared with ECG of 12/14/2018 Rate faster Confirmed by Francine Graven [phone]) on 04/10/2019 2:44:45 PM   Radiology Ct Head Wo Contrast  Result Date: 04/10/2019 CLINICAL DATA:  Seizure, unsure if hit head EXAM: CT HEAD WITHOUT CONTRAST CT CERVICAL SPINE WITHOUT CONTRAST TECHNIQUE: Multidetector CT imaging of the head and cervical spine was performed following the standard protocol without intravenous contrast. Multiplanar CT image reconstructions of the cervical spine were also generated. COMPARISON:  12/14/2018 FINDINGS: CT HEAD FINDINGS Brain: No evidence of acute infarction, hemorrhage, hydrocephalus, extra-axial collection or mass lesion/mass effect. Nonacute infarction of the right cerebellar hemisphere. Vascular: No hyperdense vessel or unexpected calcification. Skull: Normal. Negative for fracture or focal lesion. Sinuses/Orbits: No acute finding. Other: None. CT CERVICAL SPINE FINDINGS Alignment: Normal. Skull base and vertebrae: No acute fracture. No primary bone lesion or focal pathologic process. Soft tissues and spinal canal: No prevertebral fluid or swelling. No visible canal hematoma. Disc levels: Severe DISH  of the cervical spine with focally severe disc space height loss and osteophytosis of C5-C6. Upper chest: Negative. Other: None. IMPRESSION: 1. No acute intracranial pathology. Small nonacute infarction of the right cerebellar hemisphere. 2.  No fracture or static subluxation of the cervical spine. 3. Severe DISH of the cervical spine with focally severe disc space height loss and osteophytosis of C5-C6. Electronically Signed   By: Eddie Candle M.D.   On: 04/10/2019 16:56   Ct Chest Wo Contrast  Result Date: 04/10/2019 CLINICAL DATA:  Per EMS patient from home. Family states patient had seizure and was altered when he came to. Patient brother called and stated that patient has history of seizures and was postictal. Abnormal cxrPneumonia, unresolved or complicated EXAM: CT CHEST WITHOUT CONTRAST TECHNIQUE: Multidetector CT imaging of the chest was performed following the standard protocol without IV contrast. COMPARISON:  None. FINDINGS: Cardiovascular: No significant vascular findings. Normal heart size. No pericardial effusion. Mediastinum/Nodes: No axillary supraclavicular adenopathy. No mediastinal hilar adenopathy no pericardial effusion. Esophagus normal. Lungs/Pleura: Mild basilar atelectasis. No infiltrate. Suspicious nodularity. Airways normal. Upper Abdomen: Limited view of the liver, kidneys, pancreas are unremarkable. Normal adrenal glands. Musculoskeletal: No aggressive osseous lesion. IMPRESSION: 1. No acute cardiopulmonary findings. 2. No aspiration or pneumonia. 3. No fracture. Electronically Signed   By: Suzy Bouchard M.D.   On: 04/10/2019 17:01   Ct Cervical Spine Wo Contrast  Result Date: 04/10/2019 CLINICAL DATA:  Seizure, unsure if hit head EXAM: CT HEAD WITHOUT CONTRAST CT CERVICAL SPINE WITHOUT CONTRAST TECHNIQUE: Multidetector CT imaging of the head and cervical spine was performed following the standard protocol without intravenous contrast. Multiplanar CT image reconstructions of  the cervical spine were also generated. COMPARISON:  12/14/2018 FINDINGS: CT HEAD FINDINGS Brain: No evidence of acute infarction, hemorrhage, hydrocephalus, extra-axial collection or mass lesion/mass effect. Nonacute infarction of the right cerebellar hemisphere. Vascular: No hyperdense vessel or unexpected calcification. Skull: Normal. Negative for fracture or focal lesion. Sinuses/Orbits: No acute finding. Other: None. CT CERVICAL SPINE FINDINGS Alignment: Normal. Skull base and vertebrae: No acute fracture. No primary bone lesion or focal pathologic process. Soft tissues and spinal canal: No prevertebral fluid or swelling. No visible  canal hematoma. Disc levels: Severe DISH of the cervical spine with focally severe disc space height loss and osteophytosis of C5-C6. Upper chest: Negative. Other: None. IMPRESSION: 1. No acute intracranial pathology. Small nonacute infarction of the right cerebellar hemisphere. 2.  No fracture or static subluxation of the cervical spine. 3. Severe DISH of the cervical spine with focally severe disc space height loss and osteophytosis of C5-C6. Electronically Signed   By: Eddie Candle M.D.   On: 04/10/2019 16:56   Dg Chest Port 1 View  Result Date: 04/10/2019 CLINICAL DATA:  Rule out infection lesions EXAM: PORTABLE CHEST 1 VIEW COMPARISON:  12/14/2018 FINDINGS: Cardiomegaly. Low volume AP portable examination with mild, diffuse interstitial pulmonary opacity and loss of distinction of the left lung base. The visualized skeletal structures are unremarkable. IMPRESSION: Cardiomegaly. Low volume AP portable examination with mild, diffuse interstitial pulmonary opacity and loss of distinction of the left lung base. Suspect underpenetration and overlying soft tissue combined with low lung volumes, however left lung base airspace disease is not excluded. PA and lateral radiographs may be helpful to further assess. Electronically Signed   By: Eddie Candle M.D.   On: 04/10/2019 14:38      Procedures .Critical Care Performed by: Eustaquio Maize, PA-C Authorized by: Eustaquio Maize, PA-C   Critical care provider statement:    Critical care time (minutes):  45   Critical care was necessary to treat or prevent imminent or life-threatening deterioration of the following conditions:  Sepsis   Critical care was time spent personally by me on the following activities:  Discussions with consultants, evaluation of patient's response to treatment, examination of patient, ordering and performing treatments and interventions, ordering and review of laboratory studies, ordering and review of radiographic studies, pulse oximetry, re-evaluation of patient's condition, obtaining history from patient or surrogate and review of old charts   (including critical care time)  Medications Ordered in ED Medications  cefTRIAXone (ROCEPHIN) 1 g in sodium chloride 0.9 % 100 mL IVPB (0 g Intravenous Stopped 04/10/19 1630)  sodium chloride 0.9 % bolus 1,000 mL (0 mLs Intravenous Stopped 04/10/19 1556)  acetaminophen (TYLENOL) tablet 650 mg (650 mg Oral Given 04/10/19 1414)  sodium chloride 0.9 % bolus 1,000 mL (1,000 mLs Intravenous New Bag/Given 04/10/19 1630)     Initial Impression / Assessment and Plan / ED Course  I have reviewed the triage vital signs and the nursing notes.  Pertinent labs & imaging results that were available during my care of the patient were reviewed by me and considered in my medical decision making (see chart for details).    68 year old male with seizure disorder who presents to the ED after being found in a postictal state this morning by home health nurse.  Noted patient had had.  He is pleasantly demented and does not know why he is in the ED today.  I gathered further information from patient's brother Shahzad Wragg -patient has had more seizures in the past month after being diagnosed with dementia.  There is concern that he may not be taking his medications  appropriately.  Arrival patient tachycardic in the 110s and has a temperature of 101.  Triage report EMS reported the patient's urine smelled quite foul.  Last time patient was seen in the ED for seizure-like activity he was found to have pneumonia.  Rule out infection today.  Patient may have a temp and tachycardic from the seizure versus infection.  Will obtain baseline blood work today including lactic acid,  urinalysis, chest x-ray.  Give fluids at this time but hold off on antibiotics currently.  Check valproic acid level as patient is on Depakote. Given we do not know if patient had head with fever will obtain CT head and CT C-spine today as well.  Start patient on Rocephin with suspected source of infection urine.    CXR questionable infiltrate but given portable x-ray unable to fully visualize.  Radiologist recommended AP and lateral views.  Given patient is going for CT scan regardless we will add CT chest.   Lactic acid 2.6.  Leukocytosis of 15,000.  Creatinine elevated today at 2.66.  Patient does have some mild kidney disease but appears increased.  He is already receiving fluids.  Will change CT chest to noncontrast.  No electrolyte abnormalities appreciated.   CT head, CT C-spine, CT chest with no acute findings.  No pneumonia appreciated.  Still awaiting urinalysis at this time.  Regardless patient will need to be admitted for sepsis.   5:45 PM At shift change case signed out to Lily Kocher, PA-C, who will admit patient once urine returns.          Final Clinical Impressions(s) / ED Diagnoses   Final diagnoses:  Seizures ([place])  Sepsis with acute renal failure without septic shock, due to unspecified organism, unspecified acute renal failure type [facility])    ED Discharge Orders    None       Eustaquio Maize, PA-C 04/10/19 [place], [place], DO 04/12/19 [phone]

## 2019-04-10 NOTE — H&P (Signed)
History and Physical    Tony [name] [id number] DOB: 1951-05-19 DOA: 04/10/2019  PCP: Velna Ochs, MD (Confirm with patient/family/NH records and if not entered, this has to be entered at [facility] point of entry) Patient coming from: Patient is coming from home  I have personally briefly reviewed patient's old medical records in [place]  Chief Complaint: Question of seizure earlier today, urinary incontinence, and mental status changes  HPI: Tony [name] is a 68 y.o. male with medical history significant of history of seizures since age 58.  He has had a problem with dementia for a couple of years that is accelerating with a particular exacerbation after the death of his mother in the last 6-weeks.  [facility] in [facility] and is able to live at home.  He has a disabled son with Huntington's disease who lives with him. Through [facility] a CNA comes to the home.  His primary caretaker is his brother [name].  He has had increasing dementia over the past several weeks as noted but otherwise has been medically stable.  On the day of admission he was found by his CNA to have mental status changes, urinary incontinence and a question of a possible seizure.  EMS was called and the patient was transported to the [facility] emergency department.  Patient cannot give a good history.   ED Course: Patient was hemodynamically stable in the emergency department.  There were no witnessed seizures.  Laboratory revealed the patient to have an elevated lactic acid level.  Urinalysis was positive for protein leukocyte esterase 50 red blood cells and 11-20 WBCs.  He was diagnosed as having urinary tract infection with a question of impending sepsis.  He is referred to [facility] for admission for continued treatment.  Review of Systems: - (Caveat - patient is a very poor historian 2/2 dementia).  as per HPI otherwise 10 point review of systems negative.    Past Medical History:  Diagnosis Date    Alzheimer's disease ([place])    BPH (benign prostatic hyperplasia)    CKD (chronic kidney disease)    Diabetes mellitus    Type II, diagnosed 2011, not on insulin; admitted in 2011 for hyperglycemia   HTN (hypertension)    Seizure disorder ([place])    diagnosed in childhood, last seizure was years ago    History reviewed. No pertinent surgical history.   Social Hx - did not complete HS. He has had seizures since adolescence. 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 He got his license and per his brother had a more normal life. He stopped driving 2 years ago. At that time he had the onset of dementia which has been progressive. He lives in his own home and his son, who is disabled, lives with him. His Brother [name], who lives in [place], looks after their affairs. [name] has been working to keep them at home. Emric is enrolled in [facility] and was attending adult day care until a recent decline attributed to his mother's death.   reports that he has never smoked. He has never used smokeless tobacco. He reports that he does not drink alcohol or use drugs.  No Known Allergies  Family History  Problem Relation Age of Onset   Diabetes Mother    Hypertension Mother    Unacceptable: Noncontributory, unremarkable, or negative. Acceptable: Family history reviewed and  not pertinent (If you reviewed it)  Prior to Admission medications   Medication Sig Start Date End Date Taking? Authorizing Provider  cabergoline (DOSTINEX) 0.5 MG tablet Take 1 tablet (0.5 mg total) by mouth daily. 12/16/18  Yes Axel Filler, MD  divalproex (DEPAKOTE ER) 500 MG 24 hr tablet Take 2 tablets (1,000 mg total) by mouth daily. 12/16/18  Yes Masoudi, Elhamalsadat, MD  donepezil (ARICEPT) 5 MG tablet Take 1 tablet (5 mg total) by mouth at bedtime. 12/16/18  Yes Masoudi, Elhamalsadat, MD  lisinopril-hydrochlorothiazide (ZESTORETIC)  20-12.5 MG tablet Take 1 tablet by mouth daily. 12/16/18 12/16/19 Yes Masoudi, Elhamalsadat, MD  rosuvastatin (CRESTOR) 20 MG tablet Take 1 tablet (20 mg total) by mouth at bedtime. 12/16/18 12/16/19 Yes Masoudi, Elhamalsadat, MD  tamsulosin (FLOMAX) 0.4 MG CAPS capsule Take 1 capsule (0.4 mg total) by mouth daily after breakfast. 12/16/18  Yes Dewayne Hatch, MD    Physical Exam: Vitals:   04/10/19 2045 04/10/19 2100 04/10/19 2115 04/10/19 2115  BP:  110/67    Pulse: 94 92 89   Resp: (!) 23 (!) 22 20   Temp:    (!) 102.2 F (39 C)  TempSrc:    Oral  SpO2: 96% 96% 95%     Constitutional: NAD, calm, comfortable Vitals:   04/10/19 2045 04/10/19 2100 04/10/19 2115 04/10/19 2115  BP:  110/67    Pulse: 94 92 89   Resp: (!) 23 (!) 22 20   Temp:    (!) 102.2 F (39 C)  TempSrc:    Oral  SpO2: 96% 96% 95%    General appearance: heavy set man in no distress. A poor historian Eyes: PERRL, lids and conjunctivae normal ENMT: Mucous membranes are moist. Patient would not open his mouth. Front lower teeth visible and ok  Neck: normal, supple, no masses, no thyromegaly Respiratory: clear to auscultation bilaterally, no wheezing, no crackles. Normal respiratory effort. No accessory muscle use.  Cardiovascular: Regular tachycardia, no murmurs / rubs / gallops. No extremity edema. 2+ pedal pulses. No carotid bruits.  Abdomen: no tenderness, no masses palpated. No hepatosplenomegaly. Bowel sounds positive.  Musculoskeletal: no clubbing / cyanosis. No joint deformity upper and lower extremities. no contractures. Normal muscle tone.  Skin: no rashes, lesions, ulcers. No induration Neurologic: CN 2-12 grossly intact. Sensation intact. Strength 5/5 in all 4.  Psychiatric: Oriented to self, not place or context. No insight to his condition. Judgement impaired.    Labs on Admission: I have personally reviewed following labs and imaging studies  CBC: Recent Labs  Lab 04/10/19 1447  WBC 15.5*    NEUTROABS 12.3*  HGB 13.2  HCT 41.7  MCV 98.3  PLT [id number]*   Basic Metabolic Panel: Recent Labs  Lab 04/10/19 1447  NA 141  K 3.8  CL 105  CO2 24  GLUCOSE 129*  BUN 28*  CREATININE 2.66*  CALCIUM 8.2*   GFR: CrCl cannot be calculated (Unknown ideal weight.). Liver Function Tests: Recent Labs  Lab 04/10/19 1447  AST 20  ALT 13  ALKPHOS 46  BILITOT 0.9  PROT 7.1  ALBUMIN 3.6   No results for input(s): LIPASE, AMYLASE in the last 168 hours. No results for input(s): AMMONIA in the last 168 hours. Coagulation Profile: Recent Labs  Lab 04/10/19 1447  INR 1.2   Cardiac Enzymes: No results for input(s): CKTOTAL, CKMB, CKMBINDEX, TROPONINI in the last 168 hours. BNP (last 3 results) No results for input(s): PROBNP in the last 8760 hours. HbA1C: No  results for input(s): HGBA1C in the last 72 hours. CBG: Recent Labs  Lab 04/10/19 1409  GLUCAP 143*   Lipid Profile: No results for input(s): CHOL, HDL, LDLCALC, TRIG, CHOLHDL, LDLDIRECT in the last 72 hours. Thyroid Function Tests: No results for input(s): TSH, T4TOTAL, FREET4, T3FREE, THYROIDAB in the last 72 hours. Anemia Panel: No results for input(s): VITAMINB12, FOLATE, FERRITIN, TIBC, IRON, RETICCTPCT in the last 72 hours. Urine analysis:    Component Value Date/Time   COLORURINE YELLOW 04/10/2019 1820   APPEARANCEUR CLOUDY (A) 04/10/2019 1820   LABSPEC 1.013 04/10/2019 1820   PHURINE 9.0 (H) 04/10/2019 1820   GLUCOSEU NEGATIVE 04/10/2019 1820   HGBUR LARGE (A) 04/10/2019 1820   BILIRUBINUR NEGATIVE 04/10/2019 1820   KETONESUR NEGATIVE 04/10/2019 1820   PROTEINUR 100 (A) 04/10/2019 1820   UROBILINOGEN 0.2 02/07/2010 1547   NITRITE NEGATIVE 04/10/2019 1820   LEUKOCYTESUR MODERATE (A) 04/10/2019 1820    Radiological Exams on Admission: Ct Head Wo Contrast  Result Date: 04/10/2019 CLINICAL DATA:  Seizure, unsure if hit head EXAM: CT HEAD WITHOUT CONTRAST CT CERVICAL SPINE WITHOUT CONTRAST TECHNIQUE:  Multidetector CT imaging of the head and cervical spine was performed following the standard protocol without intravenous contrast. Multiplanar CT image reconstructions of the cervical spine were also generated. COMPARISON:  12/14/2018 FINDINGS: CT HEAD FINDINGS Brain: No evidence of acute infarction, hemorrhage, hydrocephalus, extra-axial collection or mass lesion/mass effect. Nonacute infarction of the right cerebellar hemisphere. Vascular: No hyperdense vessel or unexpected calcification. Skull: Normal. Negative for fracture or focal lesion. Sinuses/Orbits: No acute finding. Other: None. CT CERVICAL SPINE FINDINGS Alignment: Normal. Skull base and vertebrae: No acute fracture. No primary bone lesion or focal pathologic process. Soft tissues and spinal canal: No prevertebral fluid or swelling. No visible canal hematoma. Disc levels: Severe DISH of the cervical spine with focally severe disc space height loss and osteophytosis of C5-C6. Upper chest: Negative. Other: None. IMPRESSION: 1. No acute intracranial pathology. Small nonacute infarction of the right cerebellar hemisphere. 2.  No fracture or static subluxation of the cervical spine. 3. Severe DISH of the cervical spine with focally severe disc space height loss and osteophytosis of C5-C6. Electronically Signed   By: Eddie Candle M.D.   On: 04/10/2019 16:56   Ct Chest Wo Contrast  Result Date: 04/10/2019 CLINICAL DATA:  Per EMS patient from home. Family states patient had seizure and was altered when he came to. Patient brother called and stated that patient has history of seizures and was postictal. Abnormal cxrPneumonia, unresolved or complicated EXAM: CT CHEST WITHOUT CONTRAST TECHNIQUE: Multidetector CT imaging of the chest was performed following the standard protocol without IV contrast. COMPARISON:  None. FINDINGS: Cardiovascular: No significant vascular findings. Normal heart size. No pericardial effusion. Mediastinum/Nodes: No axillary  supraclavicular adenopathy. No mediastinal hilar adenopathy no pericardial effusion. Esophagus normal. Lungs/Pleura: Mild basilar atelectasis. No infiltrate. Suspicious nodularity. Airways normal. Upper Abdomen: Limited view of the liver, kidneys, pancreas are unremarkable. Normal adrenal glands. Musculoskeletal: No aggressive osseous lesion. IMPRESSION: 1. No acute cardiopulmonary findings. 2. No aspiration or pneumonia. 3. No fracture. Electronically Signed   By: Suzy Bouchard M.D.   On: 04/10/2019 17:01   Ct Cervical Spine Wo Contrast  Result Date: 04/10/2019 CLINICAL DATA:  Seizure, unsure if hit head EXAM: CT HEAD WITHOUT CONTRAST CT CERVICAL SPINE WITHOUT CONTRAST TECHNIQUE: Multidetector CT imaging of the head and cervical spine was performed following the standard protocol without intravenous contrast. Multiplanar CT image reconstructions of the cervical spine were  also generated. COMPARISON:  12/14/2018 FINDINGS: CT HEAD FINDINGS Brain: No evidence of acute infarction, hemorrhage, hydrocephalus, extra-axial collection or mass lesion/mass effect. Nonacute infarction of the right cerebellar hemisphere. Vascular: No hyperdense vessel or unexpected calcification. Skull: Normal. Negative for fracture or focal lesion. Sinuses/Orbits: No acute finding. Other: None. CT CERVICAL SPINE FINDINGS Alignment: Normal. Skull base and vertebrae: No acute fracture. No primary bone lesion or focal pathologic process. Soft tissues and spinal canal: No prevertebral fluid or swelling. No visible canal hematoma. Disc levels: Severe DISH of the cervical spine with focally severe disc space height loss and osteophytosis of C5-C6. Upper chest: Negative. Other: None. IMPRESSION: 1. No acute intracranial pathology. Small nonacute infarction of the right cerebellar hemisphere. 2.  No fracture or static subluxation of the cervical spine. 3. Severe DISH of the cervical spine with focally severe disc space height loss and  osteophytosis of C5-C6. Electronically Signed   By: Eddie Candle M.D.   On: 04/10/2019 16:56   Dg Chest Port 1 View  Result Date: 04/10/2019 CLINICAL DATA:  Rule out infection lesions EXAM: PORTABLE CHEST 1 VIEW COMPARISON:  12/14/2018 FINDINGS: Cardiomegaly. Low volume AP portable examination with mild, diffuse interstitial pulmonary opacity and loss of distinction of the left lung base. The visualized skeletal structures are unremarkable. IMPRESSION: Cardiomegaly. Low volume AP portable examination with mild, diffuse interstitial pulmonary opacity and loss of distinction of the left lung base. Suspect underpenetration and overlying soft tissue combined with low lung volumes, however left lung base airspace disease is not excluded. PA and lateral radiographs may be helpful to further assess. Electronically Signed   By: Eddie Candle M.D.   On: 04/10/2019 14:38    EKG: Independently reviewed. Sinus tachycardia w/o acute changes  Assessment/Plan Active Problems:   UTI (urinary tract infection)   Hypertension   Seizure disorder (HCC)   Chronic kidney disease   Dementia (HCC)   Enlarged prostate without lower urinary tract symptoms (luts)   Acute UTI  (please populate well all problems here in Problem List. (For example, if patient is on BP meds at home and you resume or decide to hold them, it is a problem that needs to be her. Same for CAD, COPD, HLD and so on)   1. UTI - BP is stable, temperature is high. Respiratory rate normal. U/A positive, cultures pending. Plan Medical admission  Rocephin 1 g q24 x 3 doses then switch to oral abx.   2. Seizure - no witnessed seizure today. Per brother he has been having seizures which he attributes to non-adherence to medication schedule. Plan Seizure precautions  Continue depakote - check valproic acid level (done in ED)  3. HTN - continue home meds  4. BPH - continue present meds  5. CKD - baseline Cr is 1.66, now elevated to 2.66. Plan He  received 2 liters NS in ED  Maintenance IVF-1/2 NS  F/u Bmet in AM  6. DM- on no treatment. Last A1C in 2018 was in normal range. He has had elevated serum glucose Plan A1C  CBG AC  Carb modified diet   DVT prophylaxis: lovenox (Lovenox/Heparin/SCD's/anticoagulated/None (if comfort care) Code Status: full code (Full/Partial (specify details) Family Communication: spoke with brother [name]. Explained Dx and Tx plan. Answered all questions. He want to have patient return home when discharged. Confirmed full code status (Specify name, relationship. Do not write "discussed with patient". Specify tel # if discussed over the phone) Disposition Plan: home in 3-4 days (specify when and where  you expect patient to be discharged) Consults called: none (with names) Admission status: inpatient (inpatient / obs / tele / medical floor / SDU)   Adella Hare MD [facility] Pager [phone]  If 7PM-7AM, please contact night-coverage [email or web address] Password [facility]  04/10/2019, 9:43 PM

## 2019-04-11 ENCOUNTER — Encounter (HOSPITAL_COMMUNITY): Payer: Self-pay | Admitting: Internal Medicine

## 2019-04-11 DIAGNOSIS — N390 Urinary tract infection, site not specified: Secondary | ICD-10-CM

## 2019-04-11 LAB — BASIC METABOLIC PANEL
Anion gap: 11 (ref 5–15)
BUN: 29 mg/dL — ABNORMAL HIGH (ref 8–23)
CO2: 25 mmol/L (ref 22–32)
Calcium: 8.8 mg/dL — ABNORMAL LOW (ref 8.9–10.3)
Chloride: 105 mmol/L (ref 98–111)
Creatinine, Ser: 2.22 mg/dL — ABNORMAL HIGH (ref 0.61–1.24)
GFR calc Af Amer: 34 mL/min — ABNORMAL LOW (ref >60)
GFR calc non Af Amer: 29 mL/min — ABNORMAL LOW (ref >60)
Glucose, Bld: 123 mg/dL — ABNORMAL HIGH (ref 70–99)
Potassium: 4.2 mmol/L (ref 3.5–5.1)
Sodium: 141 mmol/L (ref 135–145)

## 2019-04-11 LAB — CBC
HCT: 40.6 % (ref 39.0–52.0)
Hemoglobin: 12.8 g/dL — ABNORMAL LOW (ref 13.0–17.0)
MCH: 31.1 pg (ref 26.0–34.0)
MCHC: 31.5 g/dL (ref 30.0–36.0)
MCV: 98.5 fL (ref 80.0–100.0)
Platelets: 125 10*3/uL — ABNORMAL LOW (ref 150–400)
RBC: 4.12 MIL/uL — ABNORMAL LOW (ref 4.22–5.81)
RDW: 12.1 % (ref 11.5–15.5)
WBC: 18 10*3/uL — ABNORMAL HIGH (ref 4.0–10.5)
nRBC: 0 % (ref 0.0–0.2)

## 2019-04-11 LAB — GLUCOSE, CAPILLARY
Glucose-Capillary: 129 mg/dL — ABNORMAL HIGH (ref 70–99)
Glucose-Capillary: 140 mg/dL — ABNORMAL HIGH (ref 70–99)
Glucose-Capillary: 147 mg/dL — ABNORMAL HIGH (ref 70–99)
Glucose-Capillary: 135 mg/dL — ABNORMAL HIGH (ref 70–99)

## 2019-04-11 LAB — LACTIC ACID, PLASMA
Lactic Acid, Venous: 2.9 mmol/L (ref 0.5–1.9)
Lactic Acid, Venous: 2.4 mmol/L (ref 0.5–1.9)

## 2019-04-11 MED ORDER — HYDRALAZINE HCL 20 MG/ML IJ SOLN: 10.0000 mg | INTRAMUSCULAR | Status: DC | PRN

## 2019-04-11 MED ORDER — LACTATED RINGERS IV SOLN
INTRAVENOUS | Status: DC
  Administered 2019-04-11 – 2019-04-12 (×3): via INTRAVENOUS

## 2019-04-11 MED ORDER — AMLODIPINE BESYLATE 5 MG PO TABS
5.0000 mg | ORAL_TABLET | Freq: Every day | ORAL | Status: DC
  Administered 2019-04-12 – 2019-04-15 (×4): 5 mg via ORAL
  Filled 2019-04-11 (×4): qty 1

## 2019-04-11 MED ORDER — LORAZEPAM 2 MG/ML IJ SOLN: 2.0000 mg | Freq: Four times a day (QID) | INTRAMUSCULAR | Status: DC | PRN

## 2019-04-11 MED ORDER — HEPARIN SODIUM (PORCINE) 5000 UNIT/ML IJ SOLN: 5000.0000 [IU] | Freq: Three times a day (TID) | INTRAMUSCULAR | Status: DC

## 2019-04-11 MED ORDER — METOPROLOL TARTRATE 5 MG/5ML IV SOLN
5.0000 mg | Freq: Once | INTRAVENOUS | Status: DC
  Filled 2019-04-11: qty 5

## 2019-04-11 NOTE — Progress Notes (Signed)
CRITICAL VALUE ALERT  Critical Value:  Lactic 2.9  Date & Time Notied:  04/11/19 @ 0312  Provider Notified: Dr. Clearence Ped  Orders Received/Actions taken: awaiting orders.

## 2019-04-11 NOTE — Progress Notes (Addendum)
PROGRESS NOTE    Tony [name]  [id number] DOB: 26-Jul-1951 DOA: 04/10/2019 PCP: Velna Ochs, MD   Brief Narrative:  Per HPI: Tony [name] is a 68 y.o. male with medical history significant of history of seizures since age 60.  He has had a problem with dementia for a couple of years that is accelerating with a particular exacerbation after the death of his mother in the last 6-weeks.  [facility] and is able to live at home.  He has a disabled son with Huntington's disease who lives with him. Through [facility] a CNA comes to the home.  His primary caretaker is his brother [name].  He has had increasing dementia over the past several weeks as noted but otherwise has been medically stable.  On the day of admission he was found by his CNA to have mental status changes, urinary incontinence and a question of a possible seizure.  EMS was called and the patient was transported to the [facility] emergency department.  Patient cannot give a good history.   9/12: Patient was admitted with multifactorial acute encephalopathy likely metabolic, in the setting of dementia, secondary to UTI versus possible seizure.  He has been started on Rocephin with urine cultures pending.  Depakote levels are currently therapeutic and no seizure was actually witnessed.  He is also noted to have possible AKI on CKD.  Assessment & Plan:   Active Problems:   Hypertension   Seizure disorder ([place])   Chronic kidney disease   Enlarged prostate without lower urinary tract symptoms (luts)   Dementia ([facility])   UTI (urinary tract infection)   Acute UTI   Acute encephalopathy likely metabolic -Possibly secondary to UTI with urine cultures pending -Maintain on IV Rocephin for now empirically, temperature still noted to be elevated -Blood cultures with no growth over the last 24 hours  History of prior seizures -Questionable whether patient has had a seizure, if he did this was unwitnessed -Continue on  Depakote with therapeutic levels noted -Obtain EEG  Hypertension -Hold home lisinopril and HCTZ on account of AKI -Hydralazine ordered as needed for severe elevations -Start amlodipine 5 mg daily  BPH -Continue on tamsulosin  AKI on CKD with baseline 1.66 -Hold lisinopril and HCTZ on account of AKI -Follow intake and output -Continue IV fluid -Follow a.m. labs with some improvement noted this a.m.  Type 2 diabetes-stable hyperglycemia -A1c ordered and pending -Maintain on carb modified diet   DVT prophylaxis: Lovenox Code Status: Full Family Communication: We will discuss with brother [name] Disposition Plan: Continue UTI treatment and check EEG for any potential seizure activity.  Maintain on IV fluid and monitor labs.   Consultants:   None  Procedures:   None  Antimicrobials:  Anti-infectives (From admission, onward)   Start     Dose/Rate Route Frequency Ordered Stop   04/10/19 1530  cefTRIAXone (ROCEPHIN) 1 g in sodium chloride 0.9 % 100 mL IVPB     1 g 200 mL/hr over 30 Minutes Intravenous Every 24 hours 04/10/19 1518         Subjective: Patient seen and evaluated today with no new acute complaints or concerns. No acute concerns or events noted overnight. He is noted to have ongoing fevers at the moment.  Objective: Vitals:   04/11/19 0029 04/11/19 0444 04/11/19 0614 04/11/19 1102  BP:  (!) 135/98 (!) 155/81   Pulse:  (!) 143 88   Resp:  17    Temp:  98.7 F (37.1 C) (!) 101.1 F (38.4 C) (!) 101 F (38.3 C)   TempSrc: Oral Oral Oral   SpO2:  99%    Weight:    99.2 kg  Height:    6' (1.829 m)    Intake/Output Summary (Last 24 hours) at 04/11/2019 1300 Last data filed at 04/11/2019 0900 Gross per 24 hour  Intake 2612.5 ml  Output 2600 ml  Net 12.5 ml   Filed Weights   04/11/19 1102  Weight: 99.2 kg    Examination:  General exam: Appears calm and comfortable  Respiratory system: Clear to auscultation. Respiratory effort  normal. Cardiovascular system: S1 & S2 heard, RRR. No JVD, murmurs, rubs, gallops or clicks. No pedal edema. Gastrointestinal system: Abdomen is nondistended, soft and nontender. No organomegaly or masses felt. Normal bowel sounds heard. Central nervous system: Alert and awake. Extremities: Symmetric 5 x 5 power. Skin: No rashes, lesions or ulcers Psychiatry: Flat affect.    Data Reviewed: I have personally reviewed following labs and imaging studies  CBC: Recent Labs  Lab 04/10/19 1447 04/11/19 0555  WBC 15.5* 18.0*  NEUTROABS 12.3*  --   HGB 13.2 12.8*  HCT 41.7 40.6  MCV 98.3 98.5  PLT 143* 0000000*   Basic Metabolic Panel: Recent Labs  Lab 04/10/19 1447 04/11/19 0555  NA 141 141  K 3.8 4.2  CL 105 105  CO2 24 25  GLUCOSE 129* 123*  BUN 28* 29*  CREATININE 2.66* 2.22*  CALCIUM 8.2* 8.8*   GFR: Estimated Creatinine Clearance: 38.3 mL/min (A) (by C-G formula based on SCr of 2.22 mg/dL (H)). Liver Function Tests: Recent Labs  Lab 04/10/19 1447  AST 20  ALT 13  ALKPHOS 46  BILITOT 0.9  PROT 7.1  ALBUMIN 3.6   No results for input(s): LIPASE, AMYLASE in the last 168 hours. No results for input(s): AMMONIA in the last 168 hours. Coagulation Profile: Recent Labs  Lab 04/10/19 1447  INR 1.2   Cardiac Enzymes: No results for input(s): CKTOTAL, CKMB, CKMBINDEX, TROPONINI in the last 168 hours. BNP (last 3 results) No results for input(s): PROBNP in the last 8760 hours. HbA1C: No results for input(s): HGBA1C in the last 72 hours. CBG: Recent Labs  Lab 04/10/19 1409 04/11/19 0700 04/11/19 0722 04/11/19 1113  GLUCAP 143* 129* 140* 147*   Lipid Profile: No results for input(s): CHOL, HDL, LDLCALC, TRIG, CHOLHDL, LDLDIRECT in the last 72 hours. Thyroid Function Tests: No results for input(s): TSH, T4TOTAL, FREET4, T3FREE, THYROIDAB in the last 72 hours. Anemia Panel: No results for input(s): VITAMINB12, FOLATE, FERRITIN, TIBC, IRON, RETICCTPCT in the  last 72 hours. Sepsis Labs: Recent Labs  Lab 04/10/19 1447 04/10/19 1659 04/11/19 0215 04/11/19 0555  LATICACIDVEN 2.6* 2.9* 2.9* 2.4*    Recent Results (from the past 240 hour(s))  Blood Culture (routine x 2)     Status: None (Preliminary result)   Collection Time: 04/10/19  2:48 PM   Specimen: BLOOD RIGHT HAND  Result Value Ref Range Status   Specimen Description   Final    BLOOD RIGHT HAND BOTTLES DRAWN AEROBIC AND ANAEROBIC   Special Requests   Final    Blood Culture results may not be optimal due to an inadequate volume of blood received in culture bottles   Culture   Final    NO GROWTH < 24 HOURS Performed at [facility], [address] [place], [place] [address]    Report Status PENDING  Incomplete  Blood Culture (routine  x 2)     Status: None (Preliminary result)   Collection Time: 04/10/19  2:48 PM   Specimen: BLOOD RIGHT ARM  Result Value Ref Range Status   Specimen Description   Final    BLOOD RIGHT ARM BOTTLES DRAWN AEROBIC AND ANAEROBIC   Special Requests Blood Culture adequate volume  Final   Culture   Final    NO GROWTH < 24 HOURS Performed at [facility], [address] [place], [place] [address]    Report Status PENDING  Incomplete  SARS Coronavirus 2 [facility] order, Performed in [place] hospital lab) Nasopharyngeal Nasopharyngeal Swab     Status: None   Collection Time: 04/10/19  4:10 PM   Specimen: Nasopharyngeal Swab  Result Value Ref Range Status   SARS Coronavirus 2 NEGATIVE NEGATIVE Final    Comment: (NOTE) If result is NEGATIVE SARS-CoV-2 target nucleic acids are NOT DETECTED. The SARS-CoV-2 RNA is generally detectable in upper and lower  respiratory specimens during the acute phase of infection. The lowest  concentration of SARS-CoV-2 viral copies this assay can detect is 250  copies / mL. A negative result does not preclude SARS-CoV-2 infection  and should not be used as the sole basis for treatment or other  patient management  decisions.  A negative result may occur with  improper specimen collection / handling, submission of specimen other  than nasopharyngeal swab, presence of viral mutation(s) within the  areas targeted by this assay, and inadequate number of viral copies  (<250 copies / mL). A negative result must be combined with clinical  observations, patient history, and epidemiological information. If result is POSITIVE SARS-CoV-2 target nucleic acids are DETECTED. The SARS-CoV-2 RNA is generally detectable in upper and lower  respiratory specimens dur ing the acute phase of infection.  Positive  results are indicative of active infection with SARS-CoV-2.  Clinical  correlation with patient history and other diagnostic information is  necessary to determine patient infection status.  Positive results do  not rule out bacterial infection or co-infection with other viruses. If result is PRESUMPTIVE POSTIVE SARS-CoV-2 nucleic acids MAY BE PRESENT.   A presumptive positive result was obtained on the submitted specimen  and confirmed on repeat testing.  While 2019 novel coronavirus  (SARS-CoV-2) nucleic acids may be present in the submitted sample  additional confirmatory testing may be necessary for epidemiological  and / or clinical management purposes  to differentiate between  SARS-CoV-2 and other Sarbecovirus currently known to infect humans.  If clinically indicated additional testing with an alternate test  methodology [phone]) is advised. The SARS-CoV-2 RNA is generally  detectable in upper and lower respiratory sp ecimens during the acute  phase of infection. The expected result is Negative. Fact Sheet for Patients:  StrictlyIdeas.no Fact Sheet for Healthcare Providers: BankingDealers.co.za This test is not yet approved or cleared by the [place] FDA and has been authorized for detection and/or diagnosis of SARS-CoV-2 by FDA under an  Emergency Use Authorization (EUA).  This EUA will remain in effect (meaning this test can be used) for the duration of the COVID-19 declaration under Section 564(b)(1) of the Act, 21 U.S.C. section 360bbb-3(b)(1), unless the authorization is terminated or revoked sooner. Performed at [facility], [address], [place], [place] [address]          Radiology Studies: Ct Head Wo Contrast  Result Date: 04/10/2019 CLINICAL DATA:  Seizure, unsure if hit head EXAM: CT HEAD WITHOUT CONTRAST CT CERVICAL SPINE WITHOUT CONTRAST TECHNIQUE:  Multidetector CT imaging of the head and cervical spine was performed following the standard protocol without intravenous contrast. Multiplanar CT image reconstructions of the cervical spine were also generated. COMPARISON:  12/14/2018 FINDINGS: CT HEAD FINDINGS Brain: No evidence of acute infarction, hemorrhage, hydrocephalus, extra-axial collection or mass lesion/mass effect. Nonacute infarction of the right cerebellar hemisphere. Vascular: No hyperdense vessel or unexpected calcification. Skull: Normal. Negative for fracture or focal lesion. Sinuses/Orbits: No acute finding. Other: None. CT CERVICAL SPINE FINDINGS Alignment: Normal. Skull base and vertebrae: No acute fracture. No primary bone lesion or focal pathologic process. Soft tissues and spinal canal: No prevertebral fluid or swelling. No visible canal hematoma. Disc levels: Severe DISH of the cervical spine with focally severe disc space height loss and osteophytosis of C5-C6. Upper chest: Negative. Other: None. IMPRESSION: 1. No acute intracranial pathology. Small nonacute infarction of the right cerebellar hemisphere. 2.  No fracture or static subluxation of the cervical spine. 3. Severe DISH of the cervical spine with focally severe disc space height loss and osteophytosis of C5-C6. Electronically Signed   By: Eddie Candle M.D.   On: 04/10/2019 16:56   Ct Chest Wo Contrast  Result Date: 04/10/2019 CLINICAL  DATA:  Per EMS patient from home. Family states patient had seizure and was altered when he came to. Patient brother called and stated that patient has history of seizures and was postictal. Abnormal cxrPneumonia, unresolved or complicated EXAM: CT CHEST WITHOUT CONTRAST TECHNIQUE: Multidetector CT imaging of the chest was performed following the standard protocol without IV contrast. COMPARISON:  None. FINDINGS: Cardiovascular: No significant vascular findings. Normal heart size. No pericardial effusion. Mediastinum/Nodes: No axillary supraclavicular adenopathy. No mediastinal hilar adenopathy no pericardial effusion. Esophagus normal. Lungs/Pleura: Mild basilar atelectasis. No infiltrate. Suspicious nodularity. Airways normal. Upper Abdomen: Limited view of the liver, kidneys, pancreas are unremarkable. Normal adrenal glands. Musculoskeletal: No aggressive osseous lesion. IMPRESSION: 1. No acute cardiopulmonary findings. 2. No aspiration or pneumonia. 3. No fracture. Electronically Signed   By: Suzy Bouchard M.D.   On: 04/10/2019 17:01   Ct Cervical Spine Wo Contrast  Result Date: 04/10/2019 CLINICAL DATA:  Seizure, unsure if hit head EXAM: CT HEAD WITHOUT CONTRAST CT CERVICAL SPINE WITHOUT CONTRAST TECHNIQUE: Multidetector CT imaging of the head and cervical spine was performed following the standard protocol without intravenous contrast. Multiplanar CT image reconstructions of the cervical spine were also generated. COMPARISON:  12/14/2018 FINDINGS: CT HEAD FINDINGS Brain: No evidence of acute infarction, hemorrhage, hydrocephalus, extra-axial collection or mass lesion/mass effect. Nonacute infarction of the right cerebellar hemisphere. Vascular: No hyperdense vessel or unexpected calcification. Skull: Normal. Negative for fracture or focal lesion. Sinuses/Orbits: No acute finding. Other: None. CT CERVICAL SPINE FINDINGS Alignment: Normal. Skull base and vertebrae: No acute fracture. No primary bone  lesion or focal pathologic process. Soft tissues and spinal canal: No prevertebral fluid or swelling. No visible canal hematoma. Disc levels: Severe DISH of the cervical spine with focally severe disc space height loss and osteophytosis of C5-C6. Upper chest: Negative. Other: None. IMPRESSION: 1. No acute intracranial pathology. Small nonacute infarction of the right cerebellar hemisphere. 2.  No fracture or static subluxation of the cervical spine. 3. Severe DISH of the cervical spine with focally severe disc space height loss and osteophytosis of C5-C6. Electronically Signed   By: Eddie Candle M.D.   On: 04/10/2019 16:56   Dg Chest Port 1 View  Result Date: 04/10/2019 CLINICAL DATA:  Rule out infection lesions EXAM: PORTABLE CHEST 1 VIEW COMPARISON:  12/14/2018 FINDINGS: Cardiomegaly. Low volume AP portable examination with mild, diffuse interstitial pulmonary opacity and loss of distinction of the left lung base. The visualized skeletal structures are unremarkable. IMPRESSION: Cardiomegaly. Low volume AP portable examination with mild, diffuse interstitial pulmonary opacity and loss of distinction of the left lung base. Suspect underpenetration and overlying soft tissue combined with low lung volumes, however left lung base airspace disease is not excluded. PA and lateral radiographs may be helpful to further assess. Electronically Signed   By: Eddie Candle M.D.   On: 04/10/2019 14:38        Scheduled Meds:  cabergoline  0.5 mg Oral Daily   divalproex  1,000 mg Oral Daily   donepezil  5 mg Oral QHS   enoxaparin (LOVENOX) injection  40 mg Subcutaneous Q24H   lisinopril  20 mg Oral Daily   And   hydrochlorothiazide  12.5 mg Oral Daily   metoprolol tartrate  5 mg Intravenous Once   rosuvastatin  20 mg Oral QHS   senna  1 tablet Oral BID   tamsulosin  0.4 mg Oral QPC breakfast   Continuous Infusions:  sodium chloride 50 mL/hr at 04/10/19 2245   cefTRIAXone (ROCEPHIN)  IV Stopped  (04/10/19 1630)     LOS: 1 day    Time spent: 30 minutes    [place] Darleen Crocker, DO [facility] Pager [phone]  If 7PM-7AM, please contact night-coverage [email or web address] Password [email or web address] 04/11/2019, 1:00 PM

## 2019-04-11 NOTE — Progress Notes (Signed)
At 1500 had not voided all day. Contacted Dr. Manuella Ghazi and he ordered to insert foley.  Returned 900 amber urine.  Temp 101. 7 and given tylenol which brought temp to 101.  Contacted Dr. Manuella Ghazi   Patient alert and oriented x 3 , no seizure activity noted

## 2019-04-12 LAB — BASIC METABOLIC PANEL
Anion gap: 8 (ref 5–15)
BUN: 26 mg/dL — ABNORMAL HIGH (ref 8–23)
CO2: 26 mmol/L (ref 22–32)
Calcium: 8.8 mg/dL — ABNORMAL LOW (ref 8.9–10.3)
Chloride: 104 mmol/L (ref 98–111)
Creatinine, Ser: 1.73 mg/dL — ABNORMAL HIGH (ref 0.61–1.24)
GFR calc Af Amer: 46 mL/min — ABNORMAL LOW (ref >60)
GFR calc non Af Amer: 39 mL/min — ABNORMAL LOW (ref >60)
Glucose, Bld: 129 mg/dL — ABNORMAL HIGH (ref 70–99)
Potassium: 3.9 mmol/L (ref 3.5–5.1)
Sodium: 138 mmol/L (ref 135–145)

## 2019-04-12 LAB — CBC
HCT: 42.4 % (ref 39.0–52.0)
Hemoglobin: 13.7 g/dL (ref 13.0–17.0)
MCH: 31.3 pg (ref 26.0–34.0)
MCHC: 32.3 g/dL (ref 30.0–36.0)
MCV: 96.8 fL (ref 80.0–100.0)
Platelets: 111 10*3/uL — ABNORMAL LOW (ref 150–400)
RBC: 4.38 MIL/uL (ref 4.22–5.81)
RDW: 11.7 % (ref 11.5–15.5)
WBC: 15.3 10*3/uL — ABNORMAL HIGH (ref 4.0–10.5)
nRBC: 0 % (ref 0.0–0.2)

## 2019-04-12 LAB — GLUCOSE, CAPILLARY
Glucose-Capillary: 117 mg/dL — ABNORMAL HIGH (ref 70–99)
Glucose-Capillary: 175 mg/dL — ABNORMAL HIGH (ref 70–99)
Glucose-Capillary: 119 mg/dL — ABNORMAL HIGH (ref 70–99)
Glucose-Capillary: 137 mg/dL — ABNORMAL HIGH (ref 70–99)

## 2019-04-12 LAB — LACTIC ACID, PLASMA: Lactic Acid, Venous: 1.9 mmol/L (ref 0.5–1.9)

## 2019-04-12 NOTE — Progress Notes (Signed)
PROGRESS NOTE    Tony [name]  [id number] DOB: [birth year] DOA: 04/10/2019 PCP: Velna Ochs, MD   Brief Narrative:  Per HPI: Tony [name] a 68 y.o.malewith medical history significant ofhistory of seizures since age 42. He has had a problem with dementia for a couple of years that is accelerating with a particular exacerbation after the death of his mother in the last 6-weeks. [facility] and is able to live at home. He has a disabled son with Huntington's disease who lives with him. Through [facility] a CNA comes to the home.His primary caretaker is his brother Tony [name]. He has had increasing dementia over the past several weeks as noted but otherwise has been medically stable. On the day of admission he was found by his CNA to have mental status changes, urinary incontinence and a question of a possible seizure. EMS was called and the patient was transported to the [facility] department.Patient cannot give a good history.  9/12: Patient was admitted with multifactorial acute encephalopathy likely metabolic, in the setting of dementia, secondary to UTI versus possible seizure.  He has been started on Rocephin with urine cultures pending.  Depakote levels are currently therapeutic and no seizure was actually witnessed.  He is also noted to have possible AKI on CKD.  9/13: Patient appears to have improving encephalopathy with urine culture noted to have gram-negative rods and some hematuria noted in the Foley bag.  He needed to have Foley catheter placed yesterday on account of urinary retention.  EEG ordered and still pending.  Maintain on Rocephin empirically.   Assessment & Plan:   Active Problems:   Hypertension   Seizure disorder ([place])   Chronic kidney disease   Enlarged prostate without lower urinary tract symptoms (luts)   Dementia ([facility])   UTI (urinary tract infection)   Acute UTI   Acute encephalopathy likely metabolic -Likely  secondary to gram-negative rod UTI -Maintain on IV Rocephin for now empirically, temperature still noted to be elevated -Blood cultures with no growth over the last 24 hours  History of prior seizures -Questionable whether patient has had a seizure, if he did this was unwitnessed -Continue on Depakote with therapeutic levels noted -Obtain EEG  Hypertension -Hold home lisinopril and HCTZ on account of AKI -Hydralazine ordered as needed for severe elevations -Start amlodipine 5 mg daily on 9/12  BPH -Continue on tamsulosin  AKI on CKD with baseline 1.66-improving -Hold lisinopril and HCTZ on account of AKI -Follow intake and output -Continue IV fluid -Follow a.m. labs with some improvement noted this a.m.  Type 2 diabetes-stable hyperglycemia -A1c ordered and pending -Maintain on carb modified diet   DVT prophylaxis: Lovenox Code Status: Full Family Communication:  Called brother Tony [name] on 9/12 Disposition Plan: Continue UTI treatment and check EEG for any potential seizure activity.  Maintain on IV fluid and monitor labs.  Monitor ongoing urine cultures.  Return to home on discharge hopefully in the next 24-48 hours.   Consultants:   None  Procedures:   None  Antimicrobials:  Anti-infectives (From admission, onward)   Start     Dose/Rate Route Frequency Ordered Stop   04/10/19 1530  cefTRIAXone (ROCEPHIN) 1 g in sodium chloride 0.9 % 100 mL IVPB     1 g 200 mL/hr over 30 Minutes Intravenous Every 24 hours 04/10/19 1518         Subjective: Patient seen and evaluated today with no new acute complaints or concerns. No acute concerns or  events noted overnight.  Objective: Vitals:   04/11/19 1339 04/11/19 1524 04/11/19 2114 04/12/19 0655  BP: 129/63  (!) 168/76 (!) 145/71  Pulse: (!) 101  (!) 109 92  Resp: 17  18 18   Temp: (!) 101.7 F (38.7 C) (!) 101 F (38.3 C) (!) 101.3 F (38.5 C) 98.6 F (37 C)  TempSrc: Oral Oral Oral Oral  SpO2: 96%  97% 98%   Weight:      Height:        Intake/Output Summary (Last 24 hours) at 04/12/2019 1027 Last data filed at 04/12/2019 0700 Gross per 24 hour  Intake 2024.46 ml  Output 2700 ml  Net -675.54 ml   Filed Weights   04/11/19 1102  Weight: 99.2 kg    Examination:  General exam: Appears calm and comfortable  Respiratory system: Clear to auscultation. Respiratory effort normal. Cardiovascular system: S1 & S2 heard, RRR. No JVD, murmurs, rubs, gallops or clicks. No pedal edema. Gastrointestinal system: Abdomen is nondistended, soft and nontender. No organomegaly or masses felt. Normal bowel sounds heard. Central nervous system: Alert and awake Extremities: Symmetric 5 x 5 power. Skin: No rashes, lesions or ulcers Psychiatry: Difficult to assess Light red urine output in Foley bag    Data Reviewed: I have personally reviewed following labs and imaging studies  CBC: Recent Labs  Lab 04/10/19 1447 04/11/19 0555 04/12/19 0634  WBC 15.5* 18.0* 15.3*  NEUTROABS 12.3*  --   --   HGB 13.2 12.8* 13.7  HCT 41.7 40.6 42.4  MCV 98.3 98.5 96.8  PLT 143* 125* [id number]*   Basic Metabolic Panel: Recent Labs  Lab 04/10/19 1447 04/11/19 0555 04/12/19 0634  NA 141 141 138  K 3.8 4.2 3.9  CL 105 105 104  CO2 24 25 26   GLUCOSE 129* 123* 129*  BUN 28* 29* 26*  CREATININE 2.66* 2.22* 1.73*  CALCIUM 8.2* 8.8* 8.8*   GFR: Estimated Creatinine Clearance: 49.1 mL/min (A) (by C-G formula based on SCr of 1.73 mg/dL (H)). Liver Function Tests: Recent Labs  Lab 04/10/19 1447  AST 20  ALT 13  ALKPHOS 46  BILITOT 0.9  PROT 7.1  ALBUMIN 3.6   No results for input(s): LIPASE, AMYLASE in the last 168 hours. No results for input(s): AMMONIA in the last 168 hours. Coagulation Profile: Recent Labs  Lab 04/10/19 1447  INR 1.2   Cardiac Enzymes: No results for input(s): CKTOTAL, CKMB, CKMBINDEX, TROPONINI in the last 168 hours. BNP (last 3 results) No results for input(s): PROBNP in the last  8760 hours. HbA1C: No results for input(s): HGBA1C in the last 72 hours. CBG: Recent Labs  Lab 04/11/19 0700 04/11/19 0722 04/11/19 1113 04/11/19 1603 04/12/19 0723  GLUCAP 129* 140* 147* 135* 117*   Lipid Profile: No results for input(s): CHOL, HDL, LDLCALC, TRIG, CHOLHDL, LDLDIRECT in the last 72 hours. Thyroid Function Tests: No results for input(s): TSH, T4TOTAL, FREET4, T3FREE, THYROIDAB in the last 72 hours. Anemia Panel: No results for input(s): VITAMINB12, FOLATE, FERRITIN, TIBC, IRON, RETICCTPCT in the last 72 hours. Sepsis Labs: Recent Labs  Lab 04/10/19 1659 04/11/19 0215 04/11/19 0555 04/12/19 0634  LATICACIDVEN 2.9* 2.9* 2.4* 1.9    Recent Results (from the past 240 hour(s))  Blood Culture (routine x 2)     Status: None (Preliminary result)   Collection Time: 04/10/19  2:48 PM   Specimen: BLOOD RIGHT HAND  Result Value Ref Range Status   Specimen Description   Final    BLOOD  RIGHT HAND BOTTLES DRAWN AEROBIC AND ANAEROBIC   Special Requests   Final    Blood Culture results may not be optimal due to an inadequate volume of blood received in culture bottles   Culture   Final    NO GROWTH < 24 HOURS Performed at [facility], [address] [place], [place] [address]    Report Status PENDING  Incomplete  Blood Culture (routine x 2)     Status: None (Preliminary result)   Collection Time: 04/10/19  2:48 PM   Specimen: BLOOD RIGHT ARM  Result Value Ref Range Status   Specimen Description   Final    BLOOD RIGHT ARM BOTTLES DRAWN AEROBIC AND ANAEROBIC   Special Requests Blood Culture adequate volume  Final   Culture   Final    NO GROWTH < 24 HOURS Performed at [facility], [address] [place],  [address]    Report Status PENDING  Incomplete  SARS Coronavirus 2 [facility] order, Performed in [place] hospital lab) Nasopharyngeal Nasopharyngeal Swab     Status: None   Collection Time: 04/10/19  4:10 PM   Specimen: Nasopharyngeal Swab  Result  Value Ref Range Status   SARS Coronavirus 2 NEGATIVE NEGATIVE Final    Comment: (NOTE) If result is NEGATIVE SARS-CoV-2 target nucleic acids are NOT DETECTED. The SARS-CoV-2 RNA is generally detectable in upper and lower  respiratory specimens during the acute phase of infection. The lowest  concentration of SARS-CoV-2 viral copies this assay can detect is 250  copies / mL. A negative result does not preclude SARS-CoV-2 infection  and should not be used as the sole basis for treatment or other  patient management decisions.  A negative result may occur with  improper specimen collection / handling, submission of specimen other  than nasopharyngeal swab, presence of viral mutation(s) within the  areas targeted by this assay, and inadequate number of viral copies  (<250 copies / mL). A negative result must be combined with clinical  observations, patient history, and epidemiological information. If result is POSITIVE SARS-CoV-2 target nucleic acids are DETECTED. The SARS-CoV-2 RNA is generally detectable in upper and lower  respiratory specimens dur ing the acute phase of infection.  Positive  results are indicative of active infection with SARS-CoV-2.  Clinical  correlation with patient history and other diagnostic information is  necessary to determine patient infection status.  Positive results do  not rule out bacterial infection or co-infection with other viruses. If result is PRESUMPTIVE POSTIVE SARS-CoV-2 nucleic acids MAY BE PRESENT.   A presumptive positive result was obtained on the submitted specimen  and confirmed on repeat testing.  While 2019 novel coronavirus  (SARS-CoV-2) nucleic acids may be present in the submitted sample  additional confirmatory testing may be necessary for epidemiological  and / or clinical management purposes  to differentiate between  SARS-CoV-2 and other Sarbecovirus currently known to infect humans.  If clinically indicated additional testing  with an alternate test  methodology [phone]) is advised. The SARS-CoV-2 RNA is generally  detectable in upper and lower respiratory sp ecimens during the acute  phase of infection. The expected result is Negative. Fact Sheet for Patients:  StrictlyIdeas.no Fact Sheet for Healthcare Providers: BankingDealers.co.za This test is not yet approved or cleared by the [place] FDA and has been authorized for detection and/or diagnosis of SARS-CoV-2 by FDA under an Emergency Use Authorization (EUA).  This EUA will remain in effect (meaning this test can be used) for the  duration of the COVID-19 declaration under Section 564(b)(1) of the Act, 21 U.S.C. section 360bbb-3(b)(1), unless the authorization is terminated or revoked sooner. Performed at [facility], [address], [place], [place] [address]   Urine culture     Status: Abnormal (Preliminary result)   Collection Time: 04/10/19  6:20 PM   Specimen: In/Out Cath Urine  Result Value Ref Range Status   Specimen Description   Final    IN/OUT CATH URINE Performed at [facility], [address] [place], [place] [address]    Special Requests   Final    NONE Performed at [facility], [address], [place], [place] [address]    Culture >=100,000 COLONIES/mL GRAM NEGATIVE RODS (A)  Final   Report Status PENDING  Incomplete         Radiology Studies: Ct Head Wo Contrast  Result Date: 04/10/2019 CLINICAL DATA:  Seizure, unsure if hit head EXAM: CT HEAD WITHOUT CONTRAST CT CERVICAL SPINE WITHOUT CONTRAST TECHNIQUE: Multidetector CT imaging of the head and cervical spine was performed following the standard protocol without intravenous contrast. Multiplanar CT image reconstructions of the cervical spine were also generated. COMPARISON:  12/14/2018 FINDINGS: CT HEAD FINDINGS Brain: No evidence of acute infarction, hemorrhage, hydrocephalus, extra-axial collection or mass lesion/mass effect.  Nonacute infarction of the right cerebellar hemisphere. Vascular: No hyperdense vessel or unexpected calcification. Skull: Normal. Negative for fracture or focal lesion. Sinuses/Orbits: No acute finding. Other: None. CT CERVICAL SPINE FINDINGS Alignment: Normal. Skull base and vertebrae: No acute fracture. No primary bone lesion or focal pathologic process. Soft tissues and spinal canal: No prevertebral fluid or swelling. No visible canal hematoma. Disc levels: Severe DISH of the cervical spine with focally severe disc space height loss and osteophytosis of C5-C6. Upper chest: Negative. Other: None. IMPRESSION: 1. No acute intracranial pathology. Small nonacute infarction of the right cerebellar hemisphere. 2.  No fracture or static subluxation of the cervical spine. 3. Severe DISH of the cervical spine with focally severe disc space height loss and osteophytosis of C5-C6. Electronically Signed   By: Eddie Candle M.D.   On: 04/10/2019 16:56   Ct Chest Wo Contrast  Result Date: 04/10/2019 CLINICAL DATA:  Per EMS patient from home. Family states patient had seizure and was altered when he came to. Patient brother called and stated that patient has history of seizures and was postictal. Abnormal cxrPneumonia, unresolved or complicated EXAM: CT CHEST WITHOUT CONTRAST TECHNIQUE: Multidetector CT imaging of the chest was performed following the standard protocol without IV contrast. COMPARISON:  None. FINDINGS: Cardiovascular: No significant vascular findings. Normal heart size. No pericardial effusion. Mediastinum/Nodes: No axillary supraclavicular adenopathy. No mediastinal hilar adenopathy no pericardial effusion. Esophagus normal. Lungs/Pleura: Mild basilar atelectasis. No infiltrate. Suspicious nodularity. Airways normal. Upper Abdomen: Limited view of the liver, kidneys, pancreas are unremarkable. Normal adrenal glands. Musculoskeletal: No aggressive osseous lesion. IMPRESSION: 1. No acute cardiopulmonary  findings. 2. No aspiration or pneumonia. 3. No fracture. Electronically Signed   By: Suzy Bouchard M.D.   On: 04/10/2019 17:01   Ct Cervical Spine Wo Contrast  Result Date: 04/10/2019 CLINICAL DATA:  Seizure, unsure if hit head EXAM: CT HEAD WITHOUT CONTRAST CT CERVICAL SPINE WITHOUT CONTRAST TECHNIQUE: Multidetector CT imaging of the head and cervical spine was performed following the standard protocol without intravenous contrast. Multiplanar CT image reconstructions of the cervical spine were also generated. COMPARISON:  12/14/2018 FINDINGS: CT HEAD FINDINGS Brain: No evidence of acute infarction, hemorrhage, hydrocephalus, extra-axial collection or mass lesion/mass  effect. Nonacute infarction of the right cerebellar hemisphere. Vascular: No hyperdense vessel or unexpected calcification. Skull: Normal. Negative for fracture or focal lesion. Sinuses/Orbits: No acute finding. Other: None. CT CERVICAL SPINE FINDINGS Alignment: Normal. Skull base and vertebrae: No acute fracture. No primary bone lesion or focal pathologic process. Soft tissues and spinal canal: No prevertebral fluid or swelling. No visible canal hematoma. Disc levels: Severe DISH of the cervical spine with focally severe disc space height loss and osteophytosis of C5-C6. Upper chest: Negative. Other: None. IMPRESSION: 1. No acute intracranial pathology. Small nonacute infarction of the right cerebellar hemisphere. 2.  No fracture or static subluxation of the cervical spine. 3. Severe DISH of the cervical spine with focally severe disc space height loss and osteophytosis of C5-C6. Electronically Signed   By: Eddie Candle M.D.   On: 04/10/2019 16:56   Dg Chest Port 1 View  Result Date: 04/10/2019 CLINICAL DATA:  Rule out infection lesions EXAM: PORTABLE CHEST 1 VIEW COMPARISON:  12/14/2018 FINDINGS: Cardiomegaly. Low volume AP portable examination with mild, diffuse interstitial pulmonary opacity and loss of distinction of the left lung  base. The visualized skeletal structures are unremarkable. IMPRESSION: Cardiomegaly. Low volume AP portable examination with mild, diffuse interstitial pulmonary opacity and loss of distinction of the left lung base. Suspect underpenetration and overlying soft tissue combined with low lung volumes, however left lung base airspace disease is not excluded. PA and lateral radiographs may be helpful to further assess. Electronically Signed   By: Eddie Candle M.D.   On: 04/10/2019 14:38        Scheduled Meds:  amLODipine  5 mg Oral Daily   cabergoline  0.5 mg Oral Daily   divalproex  1,000 mg Oral Daily   donepezil  5 mg Oral QHS   metoprolol tartrate  5 mg Intravenous Once   rosuvastatin  20 mg Oral QHS   tamsulosin  0.4 mg Oral QPC breakfast   Continuous Infusions:  cefTRIAXone (ROCEPHIN)  IV 1 g (04/11/19 1610)   lactated ringers 100 mL/hr at 04/12/19 0927     LOS: 2 days    Time spent: 30 minutes    Webber Michiels Darleen Crocker, DO [facility] Pager [phone]  If 7PM-7AM, please contact night-coverage [email or web address] Password [facility] 04/12/2019, 10:27 AM

## 2019-04-13 ENCOUNTER — Inpatient Hospital Stay (HOSPITAL_COMMUNITY)
Admit: 2019-04-13 | Discharge: 2019-04-13 | Disposition: A | Discharge status: Home / Self Care | Payer: Medicare (Managed Care) | Attending: Internal Medicine | Admitting: Internal Medicine

## 2019-04-13 DIAGNOSIS — G40909 Epilepsy, unspecified, not intractable, without status epilepticus: Secondary | ICD-10-CM

## 2019-04-13 LAB — BASIC METABOLIC PANEL
Anion gap: 9 (ref 5–15)
BUN: 23 mg/dL (ref 8–23)
CO2: 27 mmol/L (ref 22–32)
Calcium: 8.8 mg/dL — ABNORMAL LOW (ref 8.9–10.3)
Chloride: 102 mmol/L (ref 98–111)
Creatinine, Ser: 1.52 mg/dL — ABNORMAL HIGH (ref 0.61–1.24)
GFR calc Af Amer: 53 mL/min — ABNORMAL LOW (ref >60)
GFR calc non Af Amer: 46 mL/min — ABNORMAL LOW (ref >60)
Glucose, Bld: 132 mg/dL — ABNORMAL HIGH (ref 70–99)
Potassium: 4.1 mmol/L (ref 3.5–5.1)
Sodium: 138 mmol/L (ref 135–145)

## 2019-04-13 LAB — HEMOGLOBIN A1C
Hgb A1c MFr Bld: 6.4 % — ABNORMAL HIGH (ref 4.8–5.6)
Mean Plasma Glucose: 137 mg/dL

## 2019-04-13 LAB — URINE CULTURE: Culture: >=100000 — AB

## 2019-04-13 LAB — CBC
HCT: 43.6 % (ref 39.0–52.0)
Hemoglobin: 14.2 g/dL (ref 13.0–17.0)
MCH: 31.3 pg (ref 26.0–34.0)
MCHC: 32.6 g/dL (ref 30.0–36.0)
MCV: 96 fL (ref 80.0–100.0)
Platelets: 129 10*3/uL — ABNORMAL LOW (ref 150–400)
RBC: 4.54 MIL/uL (ref 4.22–5.81)
RDW: 11.4 % — ABNORMAL LOW (ref 11.5–15.5)
WBC: 10.7 10*3/uL — ABNORMAL HIGH (ref 4.0–10.5)
nRBC: 0 % (ref 0.0–0.2)

## 2019-04-13 LAB — GLUCOSE, CAPILLARY
Glucose-Capillary: 120 mg/dL — ABNORMAL HIGH (ref 70–99)
Glucose-Capillary: 163 mg/dL — ABNORMAL HIGH (ref 70–99)
Glucose-Capillary: 119 mg/dL — ABNORMAL HIGH (ref 70–99)

## 2019-04-13 LAB — LACTIC ACID, PLASMA: Lactic Acid, Venous: 1.6 mmol/L (ref 0.5–1.9)

## 2019-04-13 MED ORDER — PIPERACILLIN-TAZOBACTAM 3.375 G IVPB
3.3750 g | Freq: Once | INTRAVENOUS | Status: AC
  Administered 2019-04-13: 10:00:00 3.375 g via INTRAVENOUS
  Filled 2019-04-13: qty 50

## 2019-04-13 MED ORDER — PIPERACILLIN-TAZOBACTAM 3.375 G IVPB
3.3750 g | Freq: Three times a day (TID) | INTRAVENOUS | Status: DC
  Administered 2019-04-13 – 2019-04-15 (×5): 3.375 g via INTRAVENOUS
  Filled 2019-04-13 (×5): qty 50

## 2019-04-13 NOTE — Procedures (Addendum)
Patient Name: ABDULSAMAD TEXEIRA  MRN: [id number]  Epilepsy Attending: Lora Havens  Referring Physician/Provider: Dr Heath Lark Date: 04/13/2019 Duration: 23.30 mins  Patient history: 68yo M with ams. EEG to evaluate for seizure  Level of alertness: awake, asleep  AEDs during EEG study: VPA  Technical aspects: This EEG study was done with scalp electrodes positioned according to the 10-20 International system of electrode placement. Electrical activity was acquired at a sampling rate of 500Hz  and reviewed with a high frequency filter of 70Hz  and a low frequency filter of 1Hz . EEG data were recorded continuously and digitally stored.   DESCRIPTION:  The posterior dominant rhythm consists of 9-10 Hz activity of moderate voltage (25-35 uV) seen predominantly in posterior head regions, symmetric and reactive to eye opening and eye closing. Sleep was characterized by sleep spindles( 12-14hz ) maximal frontocentral. There were frequent sharp waves in right frontotemporal region, maximal F8 more frequent in sleep. During sleep, sharp transients in left frontotemporal region were also noted which could potentially be wickets vs epileptiform sharp waves.  Hyperventilation and photic stimulation were not performed.  IMPRESSION: This study showed evidence of epileptogenicity in right frontotemporal region. There were some sharp transients in left frontotemporal region seen only during drowsiness and could be sharp waves or wickets. No seizures were seen throughout the recording.  Please consider a longer EEG if seizures persist in future for better evaluation of left sided sharp transients.

## 2019-04-13 NOTE — Progress Notes (Signed)
EEG Completed; Results Pending  

## 2019-04-13 NOTE — Progress Notes (Signed)
PROGRESS NOTE    Tony [name]  [id number] DOB: September 27, 1950 DOA: 04/10/2019 PCP: Velna Ochs, MD   Brief Narrative:  Per HPI: Tony [name] a 68 y.o.malewith medical history significant ofhistory of seizures since age 24. He has had a problem with dementia for a couple of years that is accelerating with a particular exacerbation after the death of his mother in the last 6-weeks. [facility] and is able to live at home. He has a disabled son with Huntington's disease who lives with him. Through [facility] a CNA comes to the home.His primary caretaker is his brother Tony [name]. He has had increasing dementia over the past several weeks as noted but otherwise has been medically stable. On the day of admission he was found by his CNA to have mental status changes, urinary incontinence and a question of a possible seizure. EMS was called and the patient was transported to the [facility] department.Patient cannot give a good history.  9/12:Patient was admitted with multifactorial acute encephalopathy likely metabolic, in the setting of dementia, secondary to UTI versus possible seizure. He has been started on Rocephin with urine cultures pending. Depakote levels are currently therapeutic and no seizure was actually witnessed. He is also noted to have possible AKI on CKD.  9/13: Patient appears to have improving encephalopathy with urine culture noted to have gram-negative rods and some hematuria noted in the Foley bag.  He needed to have Foley catheter placed yesterday on account of urinary retention.  EEG ordered and still pending.  Maintain on Rocephin empirically.  9/14: Patient has had improvement in encephalopathy noted.  We will plan to remove Foley today and see if patient can urinate without any further retention issues.  IV antibiotics changed to Zosyn based on culture results.  Will anticipate discharge on amoxicillin and ciprofloxacin if stable.   EEG for seizure activity today pending.  Anticipate discharge in 24 hours if stable.   Assessment & Plan:   Active Problems:   Hypertension   Seizure disorder (Tony [name])   Chronic kidney disease   Enlarged prostate without lower urinary tract symptoms (luts)   Dementia (HCC)   UTI (urinary tract infection)   Acute UTI   Acute encephalopathy likely metabolic-resolved -Likely secondary to UTI with procidentia as well as enterococcus -IV Rocephin to be changed to Zosyn for better coverage for now -Blood cultures with no growth over the last 24 hours -Anticipate discharge on amoxicillin and ciprofloxacin once stable.  History of prior seizures -Questionable whether patient has had a seizure, if he did this was unwitnessed -Continue on Depakote with therapeutic levels noted -Obtain EEG, which is still pending  Hypertension-controlled -Hold home lisinopril and HCTZ on account of AKI -Hydralazine ordered as needed for severe elevations -Start amlodipine 5 mg daily on 9/12  BPH -Continue on tamsulosin  AKI on CKD with baseline 1.66-improving -Hold lisinopril and HCTZ on account of AKI and stable blood pressure readings -Creatinine of 1.52 this morning and discontinue further IV fluid -Follow a.m. labs  Type 2 diabetes-stable mild hyperglycemia -A1c 6.4% -Maintain on carb modified diet   DVT prophylaxis:Lovenox Code Status:Full Family Communication: Called brother Tony [name] on 9/12 Disposition Plan:Continue UTI treatment and check EEG for any potential seizure activity. Remove Foley catheter today.  Anticipate discharge hopefully in the next 24 hours to home.   Consultants:  None  Procedures:  None  Antimicrobials:  Anti-infectives (From admission, onward)   Start     Dose/Rate Route Frequency Ordered Stop  04/13/19 0915  piperacillin-tazobactam (ZOSYN) IVPB 3.375 g     3.375 g 100 mL/hr over 30 Minutes Intravenous  Once 04/13/19 0905 04/13/19 1019    04/10/19 1530  cefTRIAXone (ROCEPHIN) 1 g in sodium chloride 0.9 % 100 mL IVPB  Status:  Discontinued     1 g 200 mL/hr over 30 Minutes Intravenous Every 24 hours 04/10/19 1518 04/13/19 0818       Subjective: Patient seen and evaluated today with no new acute complaints or concerns. No acute concerns or events noted overnight.  Objective: Vitals:   04/12/19 0655 04/12/19 1405 04/12/19 2103 04/13/19 0522  BP: (!) 145/71 127/73 136/72 122/63  Pulse: 92 86 85 80  Resp: 18 16 18 18   Temp: 98.6 F (37 C) 99.9 F (37.7 C) (!) 100.7 F (38.2 C) 99.1 F (37.3 C)  TempSrc: Oral Oral  Oral  SpO2: 98% 97% 96% 98%  Weight:      Height:        Intake/Output Summary (Last 24 hours) at 04/13/2019 1113 Last data filed at 04/12/2019 1812 Gross per 24 hour  Intake --  Output 1200 ml  Net -1200 ml   Filed Weights   04/11/19 1102  Weight: 99.2 kg    Examination:  General exam: Appears calm and comfortable  Respiratory system: Clear to auscultation. Respiratory effort normal. Cardiovascular system: S1 & S2 heard, RRR. No JVD, murmurs, rubs, gallops or clicks. No pedal edema. Gastrointestinal system: Abdomen is nondistended, soft and nontender. No organomegaly or masses felt. Normal bowel sounds heard. Central nervous system: Alert and awake Extremities: Symmetric 5 x 5 power. Skin: No rashes, lesions or ulcers Psychiatry: Judgement and insight appear normal. Mood & affect appropriate.  Foley with mild hematuria noted    Data Reviewed: I have personally reviewed following labs and imaging studies  CBC: Recent Labs  Lab 04/10/19 1447 04/11/19 0555 04/12/19 0634 04/13/19 0438  WBC 15.5* 18.0* 15.3* 10.7*  NEUTROABS 12.3*  --   --   --   HGB 13.2 12.8* 13.7 14.2  HCT 41.7 40.6 42.4 43.6  MCV 98.3 98.5 96.8 96.0  PLT 143* 125* 111* [id number]*   Basic Metabolic Panel: Recent Labs  Lab 04/10/19 1447 04/11/19 0555 04/12/19 0634 04/13/19 0438  NA 141 141 138 138  K 3.8 4.2 3.9  4.1  CL 105 105 104 102  CO2 24 25 26 27   GLUCOSE 129* 123* 129* 132*  BUN 28* 29* 26* 23  CREATININE 2.66* 2.22* 1.73* 1.52*  CALCIUM 8.2* 8.8* 8.8* 8.8*   GFR: Estimated Creatinine Clearance: 55.9 mL/min (A) (by C-G formula based on SCr of 1.52 mg/dL (H)). Liver Function Tests: Recent Labs  Lab 04/10/19 1447  AST 20  ALT 13  ALKPHOS 46  BILITOT 0.9  PROT 7.1  ALBUMIN 3.6   No results for input(s): LIPASE, AMYLASE in the last 168 hours. No results for input(s): AMMONIA in the last 168 hours. Coagulation Profile: Recent Labs  Lab 04/10/19 1447  INR 1.2   Cardiac Enzymes: No results for input(s): CKTOTAL, CKMB, CKMBINDEX, TROPONINI in the last 168 hours. BNP (last 3 results) No results for input(s): PROBNP in the last 8760 hours. HbA1C: Recent Labs    04/11/19 0555  HGBA1C 6.4*   CBG: Recent Labs  Lab 04/12/19 0723 04/12/19 1113 04/12/19 1613 04/12/19 2129 04/13/19 0745  GLUCAP 117* 175* 119* 137* 120*   Lipid Profile: No results for input(s): CHOL, HDL, LDLCALC, TRIG, CHOLHDL, LDLDIRECT in the last 72  hours. Thyroid Function Tests: No results for input(s): TSH, T4TOTAL, FREET4, T3FREE, THYROIDAB in the last 72 hours. Anemia Panel: No results for input(s): VITAMINB12, FOLATE, FERRITIN, TIBC, IRON, RETICCTPCT in the last 72 hours. Sepsis Labs: Recent Labs  Lab 04/11/19 0215 04/11/19 0555 04/12/19 0634 04/13/19 0438  LATICACIDVEN 2.9* 2.4* 1.9 1.6    Recent Results (from the past 240 hour(s))  Blood Culture (routine x 2)     Status: None (Preliminary result)   Collection Time: 04/10/19  2:48 PM   Specimen: BLOOD RIGHT HAND  Result Value Ref Range Status   Specimen Description   Final    BLOOD RIGHT HAND BOTTLES DRAWN AEROBIC AND ANAEROBIC   Special Requests   Final    Blood Culture results may not be optimal due to an inadequate volume of blood received in culture bottles   Culture   Final    NO GROWTH 3 DAYS Performed at [facility],  [address], [place], [place] [address]    Report Status PENDING  Incomplete  Blood Culture (routine x 2)     Status: None (Preliminary result)   Collection Time: 04/10/19  2:48 PM   Specimen: BLOOD RIGHT ARM  Result Value Ref Range Status   Specimen Description   Final    BLOOD RIGHT ARM BOTTLES DRAWN AEROBIC AND ANAEROBIC   Special Requests Blood Culture adequate volume  Final   Culture   Final    NO GROWTH 3 DAYS Performed at [facility], [address] [place], [place] [address]    Report Status PENDING  Incomplete  SARS Coronavirus 2 [facility] order, Performed in [place] hospital lab) Nasopharyngeal Nasopharyngeal Swab     Status: None   Collection Time: 04/10/19  4:10 PM   Specimen: Nasopharyngeal Swab  Result Value Ref Range Status   SARS Coronavirus 2 NEGATIVE NEGATIVE Final    Comment: (NOTE) If result is NEGATIVE SARS-CoV-2 target nucleic acids are NOT DETECTED. The SARS-CoV-2 RNA is generally detectable in upper and lower  respiratory specimens during the acute phase of infection. The lowest  concentration of SARS-CoV-2 viral copies this assay can detect is 250  copies / mL. A negative result does not preclude SARS-CoV-2 infection  and should not be used as the sole basis for treatment or other  patient management decisions.  A negative result may occur with  improper specimen collection / handling, submission of specimen other  than nasopharyngeal swab, presence of viral mutation(s) within the  areas targeted by this assay, and inadequate number of viral copies  (<250 copies / mL). A negative result must be combined with clinical  observations, patient history, and epidemiological information. If result is POSITIVE SARS-CoV-2 target nucleic acids are DETECTED. The SARS-CoV-2 RNA is generally detectable in upper and lower  respiratory specimens dur ing the acute phase of infection.  Positive  results are indicative of active infection with SARS-CoV-2.  Clinical    correlation with patient history and other diagnostic information is  necessary to determine patient infection status.  Positive results do  not rule out bacterial infection or co-infection with other viruses. If result is PRESUMPTIVE POSTIVE SARS-CoV-2 nucleic acids MAY BE PRESENT.   A presumptive positive result was obtained on the submitted specimen  and confirmed on repeat testing.  While 2019 novel coronavirus  (SARS-CoV-2) nucleic acids may be present in the submitted sample  additional confirmatory testing may be necessary for epidemiological  and / or clinical management purposes  to differentiate between  SARS-CoV-2 and other Sarbecovirus currently known to infect humans.  If clinically indicated additional testing with an alternate test  methodology [phone]) is advised. The SARS-CoV-2 RNA is generally  detectable in upper and lower respiratory sp ecimens during the acute  phase of infection. The expected result is Negative. Fact Sheet for Patients:  StrictlyIdeas.no Fact Sheet for Healthcare Providers: BankingDealers.co.za This test is not yet approved or cleared by the [place] FDA and has been authorized for detection and/or diagnosis of SARS-CoV-2 by FDA under an Emergency Use Authorization (EUA).  This EUA will remain in effect (meaning this test can be used) for the duration of the COVID-19 declaration under Section 564(b)(1) of the Act, 21 U.S.C. section 360bbb-3(b)(1), unless the authorization is terminated or revoked sooner. Performed at [facility], [address], [place], [place] [address]   Urine culture     Status: Abnormal   Collection Time: 04/10/19  6:20 PM   Specimen: In/Out Cath Urine  Result Value Ref Range Status   Specimen Description   Final    IN/OUT CATH URINE Performed at [facility], [address] [place], [place] [address]    Special Requests   Final    NONE Performed at [facility], [address] [place], [place] [address]    Culture (A)  Final    >=100,000 COLONIES/mL PROVIDENCIA RETTGERI >=100,000 COLONIES/mL ENTEROCOCCUS FAECALIS    Report Status 04/13/2019 FINAL  Final   Organism ID, Bacteria PROVIDENCIA RETTGERI (A)  Final   Organism ID, Bacteria ENTEROCOCCUS FAECALIS (A)  Final      Susceptibility   Enterococcus faecalis - MIC*    AMPICILLIN <=2 SENSITIVE Sensitive     LEVOFLOXACIN 1 SENSITIVE Sensitive     NITROFURANTOIN <=16 SENSITIVE Sensitive     VANCOMYCIN 1 SENSITIVE Sensitive     * >=100,000 COLONIES/mL ENTEROCOCCUS FAECALIS   Providencia rettgeri - MIC*    AMPICILLIN RESISTANT Resistant     CEFAZOLIN >=64 RESISTANT Resistant     CEFTRIAXONE <=1 SENSITIVE Sensitive     CIPROFLOXACIN <=0.25 SENSITIVE Sensitive     GENTAMICIN <=1 SENSITIVE Sensitive     IMIPENEM 1 SENSITIVE Sensitive     NITROFURANTOIN 256 RESISTANT Resistant     TRIMETH/SULFA <=20 SENSITIVE Sensitive     AMPICILLIN/SULBACTAM 16 INTERMEDIATE Intermediate     PIP/TAZO <=4 SENSITIVE Sensitive     * >=100,000 COLONIES/mL PROVIDENCIA RETTGERI         Radiology Studies: No results found.      Scheduled Meds:  amLODipine  5 mg Oral Daily   cabergoline  0.5 mg Oral Daily   divalproex  1,000 mg Oral Daily   donepezil  5 mg Oral QHS   metoprolol tartrate  5 mg Intravenous Once   rosuvastatin  20 mg Oral QHS   tamsulosin  0.4 mg Oral QPC breakfast   Continuous Infusions:   LOS: 3 days    Time spent: 30 minutes    Kallyn Demarcus Darleen Crocker, DO [facility] Pager [phone]  If 7PM-7AM, please contact night-coverage [email or web address] Password [email or web address] 04/13/2019, 11:13 AM

## 2019-04-13 NOTE — Progress Notes (Signed)
Foley D/C earlier today. Patient attempted to void without success. Bladder scan completed at 22:30 showing over 225mL. MD paged. Awaiting orders. Will continue to monitor patient.

## 2019-04-13 NOTE — Care Management Important Message (Signed)
Important Message  Patient Details  Name: Tony [name] MRN: [id number] Date of Birth: 03-14-1951   Medicare Important Message Given:  Yes     [name] 04/13/2019, 3:13 PM

## 2019-04-13 NOTE — Progress Notes (Signed)
Pharmacy Antibiotic Note  Tony [name] is a 68 y.o. male admitted on 04/10/2019 with UTI.  Pharmacy has been consulted for Zosyn dosing.  Plan: Zosyn 3.375g IV q8h (4 hour infusion).  Monitor labs, c/s, and patient improvement. Likely de-escalation to PO abx on 9/15.  Height: 6' (182.9 cm) Weight: 218 lb 11.1 oz (99.2 kg) IBW/kg (Calculated) : 77.6  Temp (24hrs), Avg:99.7 F (37.6 C), Min:98.9 F (37.2 C), Max:100.7 F (38.2 C)  Recent Labs  Lab 04/10/19 1447 04/10/19 1659 04/11/19 0215 04/11/19 0555 04/12/19 0634 04/13/19 0438  WBC 15.5*  --   --  18.0* 15.3* 10.7*  CREATININE 2.66*  --   --  2.22* 1.73* 1.52*  LATICACIDVEN 2.6* 2.9* 2.9* 2.4* 1.9 1.6    Estimated Creatinine Clearance: 55.9 mL/min (A) (by C-G formula based on SCr of 1.52 mg/dL (H)).    No Known Allergies  Antimicrobials this admission: Zosyn 9/14 >>  CTX 9/11 >> 9/14  Dose adjustments this admission: N/A  Microbiology results: 9/11 BCx: ngtd 9/11 UCx: providencia and enterococcus faecalis   Thank you for allowing pharmacy to be a part of this patient's care.  Ramond Craver 04/13/2019 1:15 PM

## 2019-04-14 ENCOUNTER — Other Ambulatory Visit: Payer: Self-pay

## 2019-04-14 LAB — GLUCOSE, CAPILLARY
Glucose-Capillary: 136 mg/dL — ABNORMAL HIGH (ref 70–99)
Glucose-Capillary: 179 mg/dL — ABNORMAL HIGH (ref 70–99)
Glucose-Capillary: 142 mg/dL — ABNORMAL HIGH (ref 70–99)

## 2019-04-14 MED ORDER — CIPROFLOXACIN HCL 500 MG PO TABS: 500.0000 mg | ORAL_TABLET | Freq: Two times a day (BID) | ORAL | 0 refills | Status: DC

## 2019-04-14 MED ORDER — AMOXICILLIN 500 MG PO CAPS: 500.0000 mg | ORAL_CAPSULE | Freq: Three times a day (TID) | ORAL | 0 refills | Status: DC

## 2019-04-14 MED ORDER — AMLODIPINE BESYLATE 5 MG PO TABS: 5.0000 mg | ORAL_TABLET | Freq: Every day | ORAL | 3 refills | Status: DC

## 2019-04-14 NOTE — Progress Notes (Signed)
Patient had not voided. Bladder scan showing greater than 200cc in bladder. MD paged and orders obtained to perform I&O cath. 1100cc of urine returned. As per orders will place Foley Catheter.

## 2019-04-14 NOTE — Progress Notes (Signed)
PROGRESS NOTE    Tony [name]  [id number] DOB: 1951-06-23 DOA: 04/10/2019 PCP: Velna Ochs, MD   Brief Narrative:  Per HPI: Tony Harnisch Flacksis a 68 y.o.malewith medical history significant ofhistory of seizures since age 45. He has had a problem with dementia for a couple of years that is accelerating with a particular exacerbation after the death of his mother in the last 6-weeks. [facility] and is able to live at home. He has a disabled son with Huntington's disease who lives with him. Through [facility] a CNA comes to the home.His primary caretaker is his brother Tony [name]. He has had increasing dementia over the past several weeks as noted but otherwise has been medically stable. On the day of admission he was found by his CNA to have mental status changes, urinary incontinence and a question of a possible seizure. EMS was called and the patient was transported to the [facility] department.Patient cannot give a good history.  9/12:Patient was admitted with multifactorial acute encephalopathy likely metabolic, in the setting of dementia, secondary to UTI versus possible seizure. He has been started on Rocephin with urine cultures pending. Depakote levels are currently therapeutic and no seizure was actually witnessed. He is also noted to have possible AKI on CKD.  9/13:Patient appears to have improving encephalopathy with urine culture noted to have gram-negative rods and some hematuria noted in the Foley bag. He needed to have Foley catheter placed yesterday on account of urinary retention. EEG ordered and still pending. Maintain on Rocephin empirically.  9/14: Patient has had improvement in encephalopathy noted.  We will plan to remove Foley today and see if patient can urinate without any further retention issues.  IV antibiotics changed to Zosyn based on culture results.  Will anticipate discharge on amoxicillin and ciprofloxacin if stable.   EEG for seizure activity today pending.  Anticipate discharge in 24 hours if stable.  9/15: Patient overall doing well and transition to oral antibiotics.  Unfortunately Foley catheter had to be replaced due to ongoing urinary retention issues.  He continues to have mild hematuria related to UTI.  Attempted to DC home, but brother wanted to have PT evaluation and possible short-term rehab.  PT evaluation does recommend short-term rehabilitation, that likely will end up going home tomorrow with increased CNA coverage through [facility].   Assessment & Plan:   Active Problems:   Hypertension   Seizure disorder ([place])   Chronic kidney disease   Enlarged prostate without lower urinary tract symptoms (luts)   Dementia (HCC)   UTI (urinary tract infection)   Acute UTI   Acute encephalopathy likely metabolic-resolved -Likely secondary to UTI with procidentia as well as enterococcus -IV Zosyn to ciprofloxacin and amoxicillin -Blood cultures with no growth over the last 24 hours -Anticipate discharge in 24 hours once CSW has arranged for increased CNA hours at home through home health agency.  History of prior seizures -Questionable whether patient has had a seizure, if he did this was unwitnessed -Continue on Depakote with therapeutic levels noted -Obtain EEG, which is still pending  Hypertension-controlled -Hold home lisinopril and HCTZ on account of AKI -Hydralazine ordered as needed for severe elevations -Start amlodipine 5 mg dailyon 9/12  BPH -Continue on tamsulosin  AKI on CKD with baseline 1.66-improving -Hold lisinopril and HCTZ on account of AKI and stable blood pressure readings -Creatinine of 1.52 this morning and discontinue further IV fluid -Follow a.m. labs  Type 2 diabetes-stable mild hyperglycemia -A1c 6.4% -  Maintain on carb modified diet   DVT prophylaxis:Lovenox Code Status:Full Family Communication:Called brother Tony [name] on 9/15 Disposition  Plan:Continue UTI treatment as prescribed with amoxicillin and ciprofloxacin oral.  Continue Foley catheter. Brother can take patient home in am after CNA situation is worked out with [facility] who is working on getting extra coverage. Will need outpatient Urology follow up on DC for Foley management.   Consultants:  None  Procedures:  None  Antimicrobials:  Anti-infectives (From admission, onward)   Start     Dose/Rate Route Frequency Ordered Stop   04/14/19 0000  amoxicillin (AMOXIL) 500 MG capsule     500 mg Oral 3 times daily 04/14/19 1037 04/20/19 2359   04/14/19 0000  ciprofloxacin (CIPRO) 500 MG tablet     500 mg Oral 2 times daily 04/14/19 1037 04/20/19 2359   04/13/19 1600  piperacillin-tazobactam (ZOSYN) IVPB 3.375 g     3.375 g 12.5 mL/hr over 240 Minutes Intravenous Every 8 hours 04/13/19 1319     04/13/19 0915  piperacillin-tazobactam (ZOSYN) IVPB 3.375 g     3.375 g 100 mL/hr over 30 Minutes Intravenous  Once 04/13/19 0905 04/13/19 1019   04/10/19 1530  cefTRIAXone (ROCEPHIN) 1 g in sodium chloride 0.9 % 100 mL IVPB  Status:  Discontinued     1 g 200 mL/hr over 30 Minutes Intravenous Every 24 hours 04/10/19 1518 04/13/19 0818       Subjective: Patient seen and evaluated today with no new acute complaints or concerns. No acute concerns or events noted overnight. He is overall doing well, but continued to have urinary retention which required replacement of Foley catheter. Continues to have mild hematuria.  Objective: Vitals:   04/13/19 2242 04/14/19 0644 04/14/19 0646 04/14/19 1256  BP: 133/84 (!) 141/118 124/73 137/82  Pulse: (!) 116 86 82 84  Resp: 16 19  18   Temp: (!) 100.8 F (38.2 C) 98.4 F (36.9 C)  100 F (37.8 C)  TempSrc: Oral Oral  Oral  SpO2: 100% 98%  99%  Weight:      Height:        Intake/Output Summary (Last 24 hours) at 04/14/2019 1543 Last data filed at 04/14/2019 1303 Gross per 24 hour  Intake 573.76 ml  Output 1900 ml  Net -1326.24  ml   Filed Weights   04/11/19 1102  Weight: 99.2 kg    Examination:  General exam: Appears calm and comfortable  Respiratory system: Clear to auscultation. Respiratory effort normal. Cardiovascular system: S1 & S2 heard, RRR. No JVD, murmurs, rubs, gallops or clicks. No pedal edema. Gastrointestinal system: Abdomen is nondistended, soft and nontender. No organomegaly or masses felt. Normal bowel sounds heard. Central nervous system: Alert and oriented. No focal neurological deficits. Extremities: Symmetric 5 x 5 power. Skin: No rashes, lesions or ulcers Psychiatry: Judgement and insight appear normal. Mood & affect appropriate.  Foley with Clear, reddish urine output noted.    Data Reviewed: I have personally reviewed following labs and imaging studies  CBC: Recent Labs  Lab 04/10/19 1447 04/11/19 0555 04/12/19 0634 04/13/19 0438  WBC 15.5* 18.0* 15.3* 10.7*  NEUTROABS 12.3*  --   --   --   HGB 13.2 12.8* 13.7 14.2  HCT 41.7 40.6 42.4 43.6  MCV 98.3 98.5 96.8 96.0  PLT 143* 125* 111* [id number]*   Basic Metabolic Panel: Recent Labs  Lab 04/10/19 1447 04/11/19 0555 04/12/19 0634 04/13/19 0438  NA 141 141 138 138  K 3.8 4.2 3.9 4.1  CL 105 105 104 102  CO2 24 25 26 27   GLUCOSE 129* 123* 129* 132*  BUN 28* 29* 26* 23  CREATININE 2.66* 2.22* 1.73* 1.52*  CALCIUM 8.2* 8.8* 8.8* 8.8*   GFR: Estimated Creatinine Clearance: 55.9 mL/min (A) (by C-G formula based on SCr of 1.52 mg/dL (H)). Liver Function Tests: Recent Labs  Lab 04/10/19 1447  AST 20  ALT 13  ALKPHOS 46  BILITOT 0.9  PROT 7.1  ALBUMIN 3.6   No results for input(s): LIPASE, AMYLASE in the last 168 hours. No results for input(s): AMMONIA in the last 168 hours. Coagulation Profile: Recent Labs  Lab 04/10/19 1447  INR 1.2   Cardiac Enzymes: No results for input(s): CKTOTAL, CKMB, CKMBINDEX, TROPONINI in the last 168 hours. BNP (last 3 results) No results for input(s): PROBNP in the last 8760  hours. HbA1C: No results for input(s): HGBA1C in the last 72 hours. CBG: Recent Labs  Lab 04/13/19 0745 04/13/19 1120 04/13/19 1600 04/14/19 0802 04/14/19 1110  GLUCAP 120* 163* 119* 136* 179*   Lipid Profile: No results for input(s): CHOL, HDL, LDLCALC, TRIG, CHOLHDL, LDLDIRECT in the last 72 hours. Thyroid Function Tests: No results for input(s): TSH, T4TOTAL, FREET4, T3FREE, THYROIDAB in the last 72 hours. Anemia Panel: No results for input(s): VITAMINB12, FOLATE, FERRITIN, TIBC, IRON, RETICCTPCT in the last 72 hours. Sepsis Labs: Recent Labs  Lab 04/11/19 0215 04/11/19 0555 04/12/19 0634 04/13/19 0438  LATICACIDVEN 2.9* 2.4* 1.9 1.6    Recent Results (from the past 240 hour(s))  Blood Culture (routine x 2)     Status: None (Preliminary result)   Collection Time: 04/10/19  2:48 PM   Specimen: BLOOD RIGHT HAND  Result Value Ref Range Status   Specimen Description   Final    BLOOD RIGHT HAND BOTTLES DRAWN AEROBIC AND ANAEROBIC   Special Requests   Final    Blood Culture results may not be optimal due to an inadequate volume of blood received in culture bottles   Culture   Final    NO GROWTH 4 DAYS Performed at [facility], [address], [place], [place] [address]    Report Status PENDING  Incomplete  Blood Culture (routine x 2)     Status: None (Preliminary result)   Collection Time: 04/10/19  2:48 PM   Specimen: BLOOD RIGHT ARM  Result Value Ref Range Status   Specimen Description   Final    BLOOD RIGHT ARM BOTTLES DRAWN AEROBIC AND ANAEROBIC   Special Requests Blood Culture adequate volume  Final   Culture   Final    NO GROWTH 4 DAYS Performed at [facility], [address], [place], [place] [address]    Report Status PENDING  Incomplete  SARS Coronavirus 2 [facility] order, Performed in [place] hospital lab) Nasopharyngeal Nasopharyngeal Swab     Status: None   Collection Time: 04/10/19  4:10 PM   Specimen: Nasopharyngeal Swab  Result Value Ref Range  Status   SARS Coronavirus 2 NEGATIVE NEGATIVE Final    Comment: (NOTE) If result is NEGATIVE SARS-CoV-2 target nucleic acids are NOT DETECTED. The SARS-CoV-2 RNA is generally detectable in upper and lower  respiratory specimens during the acute phase of infection. The lowest  concentration of SARS-CoV-2 viral copies this assay can detect is 250  copies / mL. A negative result does not preclude SARS-CoV-2 infection  and should not be used as the sole basis for treatment or other  patient management decisions.  A negative  result may occur with  improper specimen collection / handling, submission of specimen other  than nasopharyngeal swab, presence of viral mutation(s) within the  areas targeted by this assay, and inadequate number of viral copies  (<250 copies / mL). A negative result must be combined with clinical  observations, patient history, and epidemiological information. If result is POSITIVE SARS-CoV-2 target nucleic acids are DETECTED. The SARS-CoV-2 RNA is generally detectable in upper and lower  respiratory specimens dur ing the acute phase of infection.  Positive  results are indicative of active infection with SARS-CoV-2.  Clinical  correlation with patient history and other diagnostic information is  necessary to determine patient infection status.  Positive results do  not rule out bacterial infection or co-infection with other viruses. If result is PRESUMPTIVE POSTIVE SARS-CoV-2 nucleic acids MAY BE PRESENT.   A presumptive positive result was obtained on the submitted specimen  and confirmed on repeat testing.  While 2019 novel coronavirus  (SARS-CoV-2) nucleic acids may be present in the submitted sample  additional confirmatory testing may be necessary for epidemiological  and / or clinical management purposes  to differentiate between  SARS-CoV-2 and other Sarbecovirus currently known to infect humans.  If clinically indicated additional testing with an alternate  test  methodology [phone]) is advised. The SARS-CoV-2 RNA is generally  detectable in upper and lower respiratory sp ecimens during the acute  phase of infection. The expected result is Negative. Fact Sheet for Patients:  StrictlyIdeas.no Fact Sheet for Healthcare Providers: BankingDealers.co.za This test is not yet approved or cleared by the [place] FDA and has been authorized for detection and/or diagnosis of SARS-CoV-2 by FDA under an Emergency Use Authorization (EUA).  This EUA will remain in effect (meaning this test can be used) for the duration of the COVID-19 declaration under Section 564(b)(1) of the Act, 21 U.S.C. section 360bbb-3(b)(1), unless the authorization is terminated or revoked sooner. Performed at [facility], [address] [place], [place] [address]   Urine culture     Status: Abnormal   Collection Time: 04/10/19  6:20 PM   Specimen: In/Out Cath Urine  Result Value Ref Range Status   Specimen Description   Final    IN/OUT CATH URINE Performed at [facility], [address], [place], [name] [address]    Special Requests   Final    NONE Performed at [facility], [address], [place],  [address]    Culture (A)  Final    >=100,000 COLONIES/mL PROVIDENCIA RETTGERI >=100,000 COLONIES/mL ENTEROCOCCUS FAECALIS    Report Status 04/13/2019 FINAL  Final   Organism ID, Bacteria PROVIDENCIA RETTGERI (A)  Final   Organism ID, Bacteria ENTEROCOCCUS FAECALIS (A)  Final      Susceptibility   Enterococcus faecalis - MIC*    AMPICILLIN <=2 SENSITIVE Sensitive     LEVOFLOXACIN 1 SENSITIVE Sensitive     NITROFURANTOIN <=16 SENSITIVE Sensitive     VANCOMYCIN 1 SENSITIVE Sensitive     * >=100,000 COLONIES/mL ENTEROCOCCUS FAECALIS   Providencia rettgeri - MIC*    AMPICILLIN RESISTANT Resistant     CEFAZOLIN >=64 RESISTANT Resistant     CEFTRIAXONE <=1 SENSITIVE Sensitive     CIPROFLOXACIN <=0.25 SENSITIVE  Sensitive     GENTAMICIN <=1 SENSITIVE Sensitive     IMIPENEM 1 SENSITIVE Sensitive     NITROFURANTOIN 256 RESISTANT Resistant     TRIMETH/SULFA <=20 SENSITIVE Sensitive     AMPICILLIN/SULBACTAM 16 INTERMEDIATE Intermediate     PIP/TAZO <=4 SENSITIVE  Sensitive     * >=100,000 COLONIES/mL PROVIDENCIA RETTGERI         Radiology Studies: No results found.      Scheduled Meds: . amLODipine  5 mg Oral Daily  . cabergoline  0.5 mg Oral Daily  . divalproex  1,000 mg Oral Daily  . donepezil  5 mg Oral QHS  . metoprolol tartrate  5 mg Intravenous Once  . rosuvastatin  20 mg Oral QHS  . tamsulosin  0.4 mg Oral QPC breakfast   Continuous Infusions: . piperacillin-tazobactam (ZOSYN)  IV 3.375 g (04/14/19 1239)     LOS: 4 days    Time spent: 30 minutes    Dura Mccormack Darleen Crocker, DO [facility] Pager [phone]  If 7PM-7AM, please contact night-coverage [email or web address] Password [facility] 04/14/2019, 3:43 PM

## 2019-04-14 NOTE — Evaluation (Signed)
Physical Therapy Evaluation Patient Details Name: Tony [name] MRN: [id number] DOB: 06-30-1951 Today's Date: 04/14/2019   History of Present Illness  Tony [name] is a 68 y.o. male with medical history significant of history of seizures since age 47.  He has had a problem with dementia for a couple of years that is accelerating with a particular exacerbation after the death of his mother in the last 6-weeks.  [facility] and is able to live at home.  He has a disabled son with Huntington's disease who lives with him. Through [facility] a CNA comes to the home.  His primary caretaker is his brother [name].  He has had increasing dementia over the past several weeks as noted but otherwise has been medically stable.  On the day of admission he was found by his CNA to have mental status changes, urinary incontinence and a question of a possible seizure.  EMS was called and the patient was transported to the [facility] emergency department.  Patient cannot give a good history.    Clinical Impression  Patient demonstrates slow labored movement for sitting up at bedside, incontinent of stool, able to stand using RW while being cleaned, limited for ambulation due to c/o generalized weakness and fatigue.  Patient tolerated sitting up in chair after therapy - RN/NT aware.  Patient will benefit from continued physical therapy in hospital and recommended venue below to increase strength, balance, endurance for safe ADLs and gait.    Follow Up Recommendations SNF;Supervision for mobility/OOB;Supervision/Assistance - 24 hour    Equipment Recommendations  Rolling walker with 5" wheels    Recommendations for Other Services       Precautions / Restrictions Precautions Precautions: Fall Restrictions Weight Bearing Restrictions: No      Mobility  Bed Mobility Overal bed mobility: Needs Assistance Bed Mobility: Supine to Sit     Supine to sit: Supervision     General bed mobility  comments: increased time, labored movement  Transfers Overall transfer level: Needs assistance Equipment used: Rolling walker (2 wheeled) Transfers: Sit to/from [facility] Sit to Stand: Min guard;Min assist Stand pivot transfers: Min guard;Min assist       General transfer comment: slightly unsteady  Ambulation/Gait Ambulation/Gait assistance: Min guard;Min assist Gait Distance (Feet): 45 Feet Assistive device: Rolling walker (2 wheeled) Gait Pattern/deviations: Decreased step length - right;Decreased step length - left;Decreased stride length Gait velocity: decreased   General Gait Details: slow slightly labored cadence limited mostly due to c/o BLE weakness and fatigue  Stairs            Wheelchair Mobility    Modified Rankin (Stroke Patients Only)       Balance Overall balance assessment: Needs assistance Sitting-balance support: Feet supported;No upper extremity supported Sitting balance-Leahy Scale: Fair Sitting balance - Comments: fair/good seated at bedside   Standing balance support: During functional activity;No upper extremity supported Standing balance-Leahy Scale: Poor Standing balance comment: fair using RW                             Pertinent Vitals/Pain Pain Assessment: No/denies pain    Home Living Family/patient expects to be discharged to:: Private residence Living Arrangements: Children Available Help at Discharge: Family;Personal care attendant Type of Home: Mobile home Home Access: Stairs to enter Entrance Stairs-Rails: Right;Left;Can reach both Entrance Stairs-Number of Steps: 4 Home Layout: One level Home Equipment: Civil engineer, contracting  Prior Function Level of Independence: Independent         Comments: Community ambulator without AD, does not drive, mows his lawn     Hand Dominance        Extremity/Trunk Assessment   Upper Extremity Assessment Upper Extremity Assessment: Generalized  weakness    Lower Extremity Assessment Lower Extremity Assessment: Generalized weakness    Cervical / Trunk Assessment Cervical / Trunk Assessment: Normal  Communication   Communication: No difficulties  Cognition Arousal/Alertness: Awake/alert Behavior During Therapy: WFL for tasks assessed/performed Overall Cognitive Status: Within Functional Limits for tasks assessed                                        General Comments      Exercises     Assessment/Plan    PT Assessment Patient needs continued PT services  PT Problem List Decreased strength;Decreased activity tolerance;Decreased balance;Decreased mobility       PT Treatment Interventions Gait training;Stair training;Functional mobility training;Therapeutic activities;Therapeutic exercise;Patient/family education;Balance training    PT Goals (Current goals can be found in the Care Plan section)  Acute Rehab PT Goals Patient Stated Goal: return home with family to assist PT Goal Formulation: With patient Time For Goal Achievement: 04/28/19 Potential to Achieve Goals: Good    Frequency Min 3X/week   Barriers to discharge        Co-evaluation               AM-PAC PT "6 Clicks" Mobility  Outcome Measure Help needed turning from your back to your side while in a flat bed without using bedrails?: None Help needed moving from lying on your back to sitting on the side of a flat bed without using bedrails?: A Little Help needed moving to and from a bed to a chair (including a wheelchair)?: A Little Help needed standing up from a chair using your arms (e.g., wheelchair or bedside chair)?: A Little Help needed to walk in hospital room?: A Little Help needed climbing 3-5 steps with a railing? : A Lot 6 Click Score: 18    End of Session   Activity Tolerance: Patient tolerated treatment well;Patient limited by fatigue Patient left: in chair;with call bell/phone within reach;with chair alarm  set Nurse Communication: Mobility status PT Visit Diagnosis: Other abnormalities of gait and mobility (R26.89);Unsteadiness on feet (R26.81);Muscle weakness (generalized) (M62.81)    Time: [id number] PT Time Calculation (min) (ACUTE ONLY): 31 min   Charges:   PT Evaluation $PT Eval Moderate Complexity: 1 Mod PT Treatments $Therapeutic Activity: 23-37 mins        1:20 PM, 04/14/19 Lonell Grandchild, MPT Physical Therapist with [facility] [phone] office [phone] mobile phone

## 2019-04-14 NOTE — TOC Transition Note (Signed)
Transition of Care [facility]) - CM/SW Discharge Note   Patient Details  Name: Tony [name] MRN: [id number] Date of Birth: 08/09/1950  Transition of Care [facility]) CM/SW Contact:  Aviyanna Colbaugh, Chauncey Reading, RN Phone Number: 04/14/2019, 3:56 PM   Clinical Narrative:   After long discussion with brother, [name] and Paden, SW with [facility], patient will return home. [facility] will increase home care hours for patient and Chriss Czar will pay OOP for night time sitter hours.   Home Care can start tomorrow, for a safe discharge, patient will DC home tomorrow. Patient will need a RW. Estrella Deeds will have RW ordered and delivered to patient's home.   [name] in agreement and comfortable with this plan.     Barriers to Discharge: No Barriers Identified   Patient Goals and CMS Choice Patient states their goals for this hospitalization and ongoing recovery are:: brother wants patient to return home safely, concerned about patient having foley catheter at home CMS Medicare.gov Compare Post Acute Care list provided to:: Other (Comment Required)([name] Valda Lamb- brother) Choice offered to / list presented to : Sibling    Discharge Plan and Services   Discharge Planning Services: CM Consult              Readmission Risk Interventions No flowsheet data found.

## 2019-04-14 NOTE — Plan of Care (Signed)
  Problem: Acute Rehab PT Goals(only PT should resolve) Goal: Pt Will Go Supine/Side To Sit Outcome: Progressing Flowsheets (Taken 04/14/2019 1322) Pt will go Supine/Side to Sit: with modified independence Goal: Patient Will Transfer Sit To/From Stand Outcome: Progressing Flowsheets (Taken 04/14/2019 1322) Patient will transfer sit to/from stand: with supervision Goal: Pt Will Transfer Bed To Chair/Chair To Bed Outcome: Progressing Flowsheets (Taken 04/14/2019 1322) Pt will Transfer Bed to Chair/Chair to Bed: with supervision Goal: Pt Will Ambulate Outcome: Progressing Flowsheets (Taken 04/14/2019 1322) Pt will Ambulate:  100 feet  with supervision  with rolling walker Note: Advance to cane or without AD if possible   1:23 PM, 04/14/19 Lonell Grandchild, MPT Physical Therapist with [facility] [phone] office [phone] mobile phone

## 2019-04-14 NOTE — TOC Initial Note (Addendum)
Transition of Care [facility]) - Initial/Assessment Note    Patient Details  Name: Tony [name] MRN: [id number] Date of Birth: January 08, 1951  [facility]) CM/SW Contact:    Miyoshi Ligas, Chauncey Reading, RN Phone Number: 04/14/2019, 11:49 AM  Clinical Narrative:  Patient her with AMS. Medical history significant ofhistory of seizures since age 68 and dementia. Recent passing of mother. Patient lives at home with his son who has Huntington's disease. Brother- [name] checks on patient twice weekly, fixing his medication box/ house chores and talks to him multiple times a day via phone.  Patient is active with [facility], has a CNA MWF, 2 hours a day.   Per [name], patient walks independently at home. Does have some cognitive impairment and is starting to have some confusion in the afternoons. Patient has still been able to mow grass and do some household chores, neighbors help out also.   Patient will DC with a foley, [name] is concerned that patient may pull it out if he gets confused and is trying to arrange more hours with a CNA and is willing to pay OOP for this. [name] has checked into facilities for LTC when the time comes and he fills that time is coming near, however he does not want to place his brother in a facility for long term yet. Discussed home health RN with [name] and he is agreeable to this. [name] would be willing to take patient home with home health and increased CNA hours, reporting that he can be off work end of this week and could be available to be with his brother.    PT eval pending. CM has talked with Gwynneth Munson RN and will follow up with them to discuss needs.   Expected Discharge Plan: [place] Barriers to Discharge: Continued Medical Work up   Patient Goals and CMS Choice Patient states their goals for this hospitalization and ongoing recovery are:: brother wants patient to return home safely, concerned about patient having foley catheter at home CMS  Medicare.gov Compare Post Acute Care list provided to:: Other (Comment Required)([name] Valda Lamb- brother) Choice offered to / list presented to : Patient, Sibling  Expected Discharge Plan and Services Expected Discharge Plan: [place]   Discharge Planning Services: CM Consult   Living arrangements for the past 2 months: Single Family Home Expected Discharge Date: 04/14/19                       Prior Living Arrangements/Services Living arrangements for the past 2 months: Single Family Home Lives with:: Adult Children(son with Huntington's) Patient language and need for interpreter reviewed:: Yes        Need for Family Participation in Patient Care: Yes (Comment) Care giver support system in place?: Yes (comment)   Criminal Activity/Legal Involvement Pertinent to Current Situation/Hospitalization: No - Comment as needed  Activities of Daily Living Home Assistive Devices/Equipment: CBG Meter ADL Screening (condition at time of admission) Patient's cognitive ability adequate to safely complete daily activities?: No Is the patient deaf or have difficulty hearing?: No Does the patient have difficulty seeing, even when wearing glasses/contacts?: No Does the patient have difficulty concentrating, remembering, or making decisions?: Yes Patient able to express need for assistance with ADLs?: Yes Does the patient have difficulty dressing or bathing?: Yes Independently performs ADLs?: No Communication: Dependent Is this a change from baseline?: Pre-admission baseline Dressing (OT): Needs assistance Is this a change from baseline?:  Pre-admission baseline Grooming: Needs assistance Is this a change from baseline?: Pre-admission baseline Feeding: Dependent, Needs assistance Is this a change from baseline?: Pre-admission baseline Bathing: Needs assistance Is this a change from baseline?: Pre-admission baseline Toileting: Needs assistance Is this a change from baseline?:  Pre-admission baseline In/Out Bed: Needs assistance Is this a change from baseline?: Pre-admission baseline Walks in Home: Independent Does the patient have difficulty walking or climbing stairs?: No Weakness of Legs: None Weakness of Arms/Hands: None  Permission Sought/Granted Permission sought to share information with : Other (comment)([facility])            Admission diagnosis:  Seizures ([place]) [R56.9] Sepsis with acute renal failure without septic shock, due to unspecified organism, unspecified acute renal failure type ([place]) [A41.9, R65.20, N17.9] Patient Active Problem List   Diagnosis Date Noted  . UTI (urinary tract infection) 04/10/2019  . Acute UTI 04/10/2019  . Seizure ([place]) 12/15/2018  . CAP (community acquired pneumonia) 12/14/2018  . Dementia ([place]) 10/20/2018  . Enlarged prostate without lower urinary tract symptoms ([place]) 06/30/2018  . Hyperprolactinemia ([place]) 11/20/2017  . HLD (hyperlipidemia) 09/02/2016  . Chronic kidney disease 03/19/2016  . Gynecomastia, male 04/25/2015  . Routine health maintenance 04/25/2015  . Erectile disorder due to medical condition in male patient 05/07/2014  . Hypertension 12/12/2011  . Prediabetes 12/12/2011  . Seizure disorder ([place]) 12/12/2011   PCP:  Velna Ochs, MD Pharmacy:   [facility] [address], [place] - [place] [address] [place] [address] [place] [address] Phone: [phone] Fax: [phone]  [facility] [place] [place] - [address] [place] [address] Phone: [phone] Fax: [phone]     Social Determinants of Health (SDOH) Interventions    Readmission Risk Interventions No flowsheet data found.

## 2019-04-15 DIAGNOSIS — R338 Other retention of urine: Secondary | ICD-10-CM

## 2019-04-15 DIAGNOSIS — A419 Sepsis, unspecified organism: Secondary | ICD-10-CM

## 2019-04-15 DIAGNOSIS — R6520 Severe sepsis without septic shock: Secondary | ICD-10-CM

## 2019-04-15 DIAGNOSIS — N179 Acute kidney failure, unspecified: Secondary | ICD-10-CM

## 2019-04-15 LAB — CBC
HCT: 38.9 % — ABNORMAL LOW (ref 39.0–52.0)
Hemoglobin: 12.8 g/dL — ABNORMAL LOW (ref 13.0–17.0)
MCH: 31.3 pg (ref 26.0–34.0)
MCHC: 32.9 g/dL (ref 30.0–36.0)
MCV: 95.1 fL (ref 80.0–100.0)
Platelets: 196 10*3/uL (ref 150–400)
RBC: 4.09 MIL/uL — ABNORMAL LOW (ref 4.22–5.81)
RDW: 11.3 % — ABNORMAL LOW (ref 11.5–15.5)
WBC: 9.7 10*3/uL (ref 4.0–10.5)
nRBC: 0 % (ref 0.0–0.2)

## 2019-04-15 LAB — GLUCOSE, CAPILLARY
Glucose-Capillary: 109 mg/dL — ABNORMAL HIGH (ref 70–99)
Glucose-Capillary: 204 mg/dL — ABNORMAL HIGH (ref 70–99)
Glucose-Capillary: 126 mg/dL — ABNORMAL HIGH (ref 70–99)

## 2019-04-15 LAB — CULTURE, BLOOD (ROUTINE X 2)
Culture: NO GROWTH
Culture: NO GROWTH
Special Requests: ADEQUATE

## 2019-04-15 LAB — BASIC METABOLIC PANEL
Anion gap: 10 (ref 5–15)
BUN: 19 mg/dL (ref 8–23)
CO2: 27 mmol/L (ref 22–32)
Calcium: 8.7 mg/dL — ABNORMAL LOW (ref 8.9–10.3)
Chloride: 101 mmol/L (ref 98–111)
Creatinine, Ser: 1.52 mg/dL — ABNORMAL HIGH (ref 0.61–1.24)
GFR calc Af Amer: 53 mL/min — ABNORMAL LOW (ref >60)
GFR calc non Af Amer: 46 mL/min — ABNORMAL LOW (ref >60)
Glucose, Bld: 130 mg/dL — ABNORMAL HIGH (ref 70–99)
Potassium: 4.1 mmol/L (ref 3.5–5.1)
Sodium: 138 mmol/L (ref 135–145)

## 2019-04-15 MED ORDER — LEVOFLOXACIN 500 MG PO TABS: 500.0000 mg | ORAL_TABLET | Freq: Every day | ORAL | 0 refills | Status: DC

## 2019-04-15 MED ORDER — LEVOFLOXACIN 500 MG PO TABS
500.0000 mg | ORAL_TABLET | Freq: Every day | ORAL | Status: DC
  Administered 2019-04-15: 500 mg via ORAL
  Filled 2019-04-15: qty 1

## 2019-04-15 NOTE — Progress Notes (Addendum)
Nsg Discharge Note  Admit Date:  04/10/2019 Discharge date: 04/15/2019   Tony [name] to be D/C'd Home per MD order.  AVS completed.  Copy for chart, and copy for patient signed, and dated. Removed both IVs-clean, dry, intact. Called report to his brother [name]. Patient was picked up to d/c to home. Patient/caregiver able to verbalize understanding.  Discharge Medication: Allergies as of 04/15/2019   No Known Allergies     Medication List    STOP taking these medications   lisinopril-hydrochlorothiazide 20-12.5 MG tablet Commonly known as: Zestoretic     TAKE these medications   amLODipine 5 MG tablet Commonly known as: NORVASC Take 1 tablet (5 mg total) by mouth daily.   cabergoline 0.5 MG tablet Commonly known as: DOSTINEX Take 1 tablet (0.5 mg total) by mouth daily.   divalproex 500 MG 24 hr tablet Commonly known as: DEPAKOTE ER Take 2 tablets (1,000 mg total) by mouth daily.   donepezil 5 MG tablet Commonly known as: ARICEPT Take 1 tablet (5 mg total) by mouth at bedtime.   levofloxacin 500 MG tablet Commonly known as: LEVAQUIN Take 1 tablet (500 mg total) by mouth daily.   rosuvastatin 20 MG tablet Commonly known as: Crestor Take 1 tablet (20 mg total) by mouth at bedtime.   tamsulosin 0.4 MG Caps capsule Commonly known as: FLOMAX Take 1 capsule (0.4 mg total) by mouth daily after breakfast.       Discharge Assessment: Vitals:   04/15/19 0539 04/15/19 1433  BP: 131/69 (!) 106/96  Pulse: 77 88  Resp: 16 20  Temp: 98.4 F (36.9 C) 99.1 F (37.3 C)  SpO2: 100% 98%   Skin clean, dry and intact without evidence of skin break down, no evidence of skin tears noted. IV catheter discontinued intact. Site without signs and symptoms of complications - no redness or edema noted at insertion site, patient denies c/o pain - only slight tenderness at site.  Dressing with slight pressure applied.  D/c Instructions-Education: Discharge instructions given to  patient/family with verbalized understanding. D/c education completed with patient/family including follow up instructions, medication list, d/c activities limitations if indicated, with other d/c instructions as indicated by MD - patient able to verbalize understanding, all questions fully answered. Patient instructed to return to ED, call 911, or call MD for any changes in condition.  Patient escorted via [place], and D/C home via private auto.  Santa Lighter, RN 04/15/2019 6:17 PM

## 2019-04-15 NOTE — TOC Transition Note (Addendum)
Transition of Care [facility]) - CM/SW Discharge Note   Patient Details  Name: MARSTON SHEWCHUK MRN: [id number] Date of Birth: Jul 02, 1951  [facility]) CM/SW Contact:  Boneta Lucks, RN Phone Number: 04/15/2019, 3:27 PM   Clinical Narrative:   Patient discharging home today. [name] brother will send transport and clean clothes. RN updated with number to call when ready for discharge.   Addendum:  [name] called he will pick patient up at [facility], RN aware he is wanting discharge instructions. [facility] updated with time of discharge as requested by his Education officer, museum.     Barriers to Discharge: No Barriers Identified   Patient Goals and CMS Choice Patient states their goals for this hospitalization and ongoing recovery are:: brother wants patient to return home safely, concerned about patient having foley catheter at home CMS Medicare.gov Compare Post Acute Care list provided to:: Other (Comment Required)([name] Valda Lamb- brother) Choice offered to / list presented to : Sibling

## 2019-04-15 NOTE — Plan of Care (Signed)
  Problem: Education: Goal: Knowledge of General Education information will improve Description Including pain rating scale, medication(s)/side effects and non-pharmacologic comfort measures Outcome: Progressing   Problem: Health Behavior/Discharge Planning: Goal: Ability to manage health-related needs will improve Outcome: Progressing   

## 2019-04-15 NOTE — Discharge Summary (Signed)
Physician Discharge Summary  Tony [name] [id number] DOB: 28-Nov-1950 DOA: 04/10/2019  PCP: Velna Ochs, MD  Admit date: 04/10/2019 Discharge date: 04/15/2019  Time spent: 35 minutes  Recommendations for Outpatient Follow-up:  1. Repeat basic metabolic panel to follow electrolytes and renal function 2. Reassess blood pressure and further adjust antihypertensive regimen as needed 3. Close follow-up the patient CBGs and A1c with initiation of hypoglycemic regimen as indicated.   Discharge Diagnoses:  Active Problems:   Hypertension   Seizure disorder ([place])   Chronic kidney disease   Enlarged prostate without lower urinary tract symptoms ([place])   Dementia ([facility])   UTI (urinary tract infection)   Acute UTI   Sepsis with acute renal failure without septic shock ([place])   Acute urinary retention   Discharge Condition: Stable and improved.  Patient discharged home with instruction to follow-up with PCP and urology as an outpatient.  Diet recommendation: Heart healthy and modified carbohydrate diet.  Filed Weights   04/11/19 1102  Weight: 99.2 kg    History of present illness:  As per H&P written by Dr. Adella Hare on 04/10/2019 68 y.o. male with medical history significant of history of seizures since age 47.  He has had a problem with dementia for a couple of years that is accelerating with a particular exacerbation after the death of his mother in the last 6-weeks.  [facility] and is able to live at home.  He has a disabled son with Huntington's disease who lives with him. Through [facility] a CNA comes to the home.  His primary caretaker is his brother [name].  He has had increasing dementia over the past several weeks as noted but otherwise has been medically stable.  On the day of admission he was found by his CNA to have mental status changes, urinary incontinence and a question of a possible seizure.  EMS was called and the patient was transported to the  [facility] emergency department.  Patient cannot give a good history.   ED Course: Patient was hemodynamically stable in the emergency department.  There were no witnessed seizures.  Laboratory revealed the patient to have an elevated lactic acid level.  Urinalysis was positive for protein leukocyte esterase 50 red blood cells and 11-20 WBCs.  He was diagnosed as having urinary tract infection with a question of impending sepsis.  [facility] is referred to [facility] for admission for continued treatment.  Hospital Course:  1-acute encephalopathy (toxic/metabolic in nature) -Associated with UTI and with concern for underlying seizure activity. -Mentation back to normal and is stable. -Patient advised to maintain adequate hydration -Outpatient follow-up with PCP as instructed. -No acute intracranial abnormalities appreciated on CT head.  2-history of prior seizures -EEG no demonstrating acute epileptic forms -Patient mentation is back to baseline and no seizure activity appreciated during hospitalization. -Resume the use of Depakote -Outpatient follow-up with neurology as previously instructed.  3-acute Enterococcus faecalis and providentia UTI -No fever and no dysuria. -Patient's urine culture available at time of discharge with adjustment on antibiotics to Levaquin daily for another 6 days to complete treatment. -Patient advised to maintain adequate hydration.  4-history of BPH and urinary retention -Unfortunately Foley catheter has to be kept in place at time of discharge due to urinary retention. -Will continue the use of Flomax -Outpatient follow-up with urology service for voiding trial and cystoscopy.  5-essential hypertension -A stable and well-controlled -Patient will be kept on amlodipine and instructed to follow heart  healthy diet -Reassess blood pressure at follow-up visit and further adjust antihypertensive regimen as needed -Lisinopril HCTZ has been discontinued at  discharge in the setting of acute on chronic renal failure.  6-acute kidney injury on chronic kidney disease -Stage III with a creatinine of 1.6 at baseline -Appears to be in the setting of UTI and obstructive uropathy -Patient was also taking nephrotoxic agents (lisinopril and HCTZ). -At discharge creatinine 1.55 -Continue holding nephrotoxic agents, complete treatment for UTI and has been discharged with Foley catheter in place. -Outpatient follow-up with urology service as instructed. -Patient advised to maintain adequate hydration.  7-type 2 diabetes: With nephropathy -A1c 6.4 -Hydrate diet -Reassess CBGs and A1c with decision on when to start hypoglycemic regimen.   Procedures:  See below for x-ray reports.   Consultations:  None   Discharge Exam: Vitals:   04/15/19 0539 04/15/19 1433  BP: 131/69 (!) 106/96  Pulse: 77 88  Resp: 16 20  Temp: 98.4 F (36.9 C) 99.1 F (37.3 C)  SpO2: 100% 98%    General: Afebrile, no chest pain, no nausea, no vomiting.  Patient significantly improved and feeling ready to go home. Cardiovascular: S1-S2, no rubs, no gallops, no JVD. Respiratory: No using accessory muscles, normal respiratory effort.  Good oxygen saturation on room air.  Clear to auscultation bilaterally. Abdomen: Soft, nontender, nondistended, positive bowel sounds Extremities: No cyanosis, no clubbing.   Discharge Instructions   Discharge Instructions    Diet - low sodium heart healthy   Complete by: As directed    Diet - low sodium heart healthy   Complete by: As directed    Discharge instructions   Complete by: As directed    Take medications as prescribed. Follow with PCP in 10 minutes. Maintain adequate hydration. Follow heart healthy diet.   Increase activity slowly   Complete by: As directed      Allergies as of 04/15/2019   No Known Allergies     Medication List    STOP taking these medications   lisinopril-hydrochlorothiazide 20-12.5 MG  tablet Commonly known as: Zestoretic     TAKE these medications   amLODipine 5 MG tablet Commonly known as: NORVASC Take 1 tablet (5 mg total) by mouth daily.   cabergoline 0.5 MG tablet Commonly known as: DOSTINEX Take 1 tablet (0.5 mg total) by mouth daily.   divalproex 500 MG 24 hr tablet Commonly known as: DEPAKOTE ER Take 2 tablets (1,000 mg total) by mouth daily.   donepezil 5 MG tablet Commonly known as: ARICEPT Take 1 tablet (5 mg total) by mouth at bedtime.   levofloxacin 500 MG tablet Commonly known as: LEVAQUIN Take 1 tablet (500 mg total) by mouth daily.   rosuvastatin 20 MG tablet Commonly known as: Crestor Take 1 tablet (20 mg total) by mouth at bedtime.   tamsulosin 0.4 MG Caps capsule Commonly known as: FLOMAX Take 1 capsule (0.4 mg total) by mouth daily after breakfast.      No Known Allergies Follow-up Information    Velna Ochs, MD Follow up in 1 week(s).   Specialty: Internal Medicine Contact information: [place] [address] [phone]        Lucas Mallow, MD. Schedule an appointment as soon as possible for a visit in 7 day(s).   Specialty: Urology Contact information: [place] [id number] [phone]           The results of significant diagnostics from this hospitalization (including imaging,  microbiology, ancillary and laboratory) are listed below for reference.    Significant Diagnostic Studies: Ct Head Wo Contrast  Result Date: 04/10/2019 CLINICAL DATA:  Seizure, unsure if hit head EXAM: CT HEAD WITHOUT CONTRAST CT CERVICAL SPINE WITHOUT CONTRAST TECHNIQUE: Multidetector CT imaging of the head and cervical spine was performed following the standard protocol without intravenous contrast. Multiplanar CT image reconstructions of the cervical spine were also generated. COMPARISON:  12/14/2018 FINDINGS: CT HEAD FINDINGS Brain: No evidence of acute infarction, hemorrhage, hydrocephalus,  extra-axial collection or mass lesion/mass effect. Nonacute infarction of the right cerebellar hemisphere. Vascular: No hyperdense vessel or unexpected calcification. Skull: Normal. Negative for fracture or focal lesion. Sinuses/Orbits: No acute finding. Other: None. CT CERVICAL SPINE FINDINGS Alignment: Normal. Skull base and vertebrae: No acute fracture. No primary bone lesion or focal pathologic process. Soft tissues and spinal canal: No prevertebral fluid or swelling. No visible canal hematoma. Disc levels: Severe DISH of the cervical spine with focally severe disc space height loss and osteophytosis of C5-C6. Upper chest: Negative. Other: None. IMPRESSION: 1. No acute intracranial pathology. Small nonacute infarction of the right cerebellar hemisphere. 2.  No fracture or static subluxation of the cervical spine. 3. Severe DISH of the cervical spine with focally severe disc space height loss and osteophytosis of C5-C6. Electronically Signed   By: Eddie Candle M.D.   On: 04/10/2019 16:56   Ct Chest Wo Contrast  Result Date: 04/10/2019 CLINICAL DATA:  Per EMS patient from home. Family states patient had seizure and was altered when he came to. Patient brother called and stated that patient has history of seizures and was postictal. Abnormal cxrPneumonia, unresolved or complicated EXAM: CT CHEST WITHOUT CONTRAST TECHNIQUE: Multidetector CT imaging of the chest was performed following the standard protocol without IV contrast. COMPARISON:  None. FINDINGS: Cardiovascular: No significant vascular findings. Normal heart size. No pericardial effusion. Mediastinum/Nodes: No axillary supraclavicular adenopathy. No mediastinal hilar adenopathy no pericardial effusion. Esophagus normal. Lungs/Pleura: Mild basilar atelectasis. No infiltrate. Suspicious nodularity. Airways normal. Upper Abdomen: Limited view of the liver, kidneys, pancreas are unremarkable. Normal adrenal glands. Musculoskeletal: No aggressive osseous  lesion. IMPRESSION: 1. No acute cardiopulmonary findings. 2. No aspiration or pneumonia. 3. No fracture. Electronically Signed   By: Suzy Bouchard M.D.   On: 04/10/2019 17:01   Ct Cervical Spine Wo Contrast  Result Date: 04/10/2019 CLINICAL DATA:  Seizure, unsure if hit head EXAM: CT HEAD WITHOUT CONTRAST CT CERVICAL SPINE WITHOUT CONTRAST TECHNIQUE: Multidetector CT imaging of the head and cervical spine was performed following the standard protocol without intravenous contrast. Multiplanar CT image reconstructions of the cervical spine were also generated. COMPARISON:  12/14/2018 FINDINGS: CT HEAD FINDINGS Brain: No evidence of acute infarction, hemorrhage, hydrocephalus, extra-axial collection or mass lesion/mass effect. Nonacute infarction of the right cerebellar hemisphere. Vascular: No hyperdense vessel or unexpected calcification. Skull: Normal. Negative for fracture or focal lesion. Sinuses/Orbits: No acute finding. Other: None. CT CERVICAL SPINE FINDINGS Alignment: Normal. Skull base and vertebrae: No acute fracture. No primary bone lesion or focal pathologic process. Soft tissues and spinal canal: No prevertebral fluid or swelling. No visible canal hematoma. Disc levels: Severe DISH of the cervical spine with focally severe disc space height loss and osteophytosis of C5-C6. Upper chest: Negative. Other: None. IMPRESSION: 1. No acute intracranial pathology. Small nonacute infarction of the right cerebellar hemisphere. 2.  No fracture or static subluxation of the cervical spine. 3. Severe DISH of the cervical spine with focally severe disc space height loss  and osteophytosis of C5-C6. Electronically Signed   By: Eddie Candle M.D.   On: 04/10/2019 16:56   Dg Chest Port 1 View  Result Date: 04/10/2019 CLINICAL DATA:  Rule out infection lesions EXAM: PORTABLE CHEST 1 VIEW COMPARISON:  12/14/2018 FINDINGS: Cardiomegaly. Low volume AP portable examination with mild, diffuse interstitial pulmonary  opacity and loss of distinction of the left lung base. The visualized skeletal structures are unremarkable. IMPRESSION: Cardiomegaly. Low volume AP portable examination with mild, diffuse interstitial pulmonary opacity and loss of distinction of the left lung base. Suspect underpenetration and overlying soft tissue combined with low lung volumes, however left lung base airspace disease is not excluded. PA and lateral radiographs may be helpful to further assess. Electronically Signed   By: Eddie Candle M.D.   On: 04/10/2019 14:38    Microbiology: Recent Results (from the past 240 hour(s))  Blood Culture (routine x 2)     Status: None   Collection Time: 04/10/19  2:48 PM   Specimen: BLOOD RIGHT HAND  Result Value Ref Range Status   Specimen Description   Final    BLOOD RIGHT HAND BOTTLES DRAWN AEROBIC AND ANAEROBIC   Special Requests   Final    Blood Culture results may not be optimal due to an inadequate volume of blood received in culture bottles   Culture   Final    NO GROWTH 5 DAYS Performed at [facility], [address] [place], [place] [address]    Report Status 04/15/2019 FINAL  Final  Blood Culture (routine x 2)     Status: None   Collection Time: 04/10/19  2:48 PM   Specimen: BLOOD RIGHT ARM  Result Value Ref Range Status   Specimen Description   Final    BLOOD RIGHT ARM BOTTLES DRAWN AEROBIC AND ANAEROBIC   Special Requests Blood Culture adequate volume  Final   Culture   Final    NO GROWTH 5 DAYS Performed at [facility], [address], [place], [place] [address]    Report Status 04/15/2019 FINAL  Final  SARS Coronavirus 2 [facility] order, Performed in [facility] hospital lab) Nasopharyngeal Nasopharyngeal Swab     Status: None   Collection Time: 04/10/19  4:10 PM   Specimen: Nasopharyngeal Swab  Result Value Ref Range Status   SARS Coronavirus 2 NEGATIVE NEGATIVE Final    Comment: (NOTE) If result is NEGATIVE SARS-CoV-2 target nucleic acids are NOT DETECTED. The  SARS-CoV-2 RNA is generally detectable in upper and lower  respiratory specimens during the acute phase of infection. The lowest  concentration of SARS-CoV-2 viral copies this assay can detect is 250  copies / mL. A negative result does not preclude SARS-CoV-2 infection  and should not be used as the sole basis for treatment or other  patient management decisions.  A negative result may occur with  improper specimen collection / handling, submission of specimen other  than nasopharyngeal swab, presence of viral mutation(s) within the  areas targeted by this assay, and inadequate number of viral copies  (<250 copies / mL). A negative result must be combined with clinical  observations, patient history, and epidemiological information. If result is POSITIVE SARS-CoV-2 target nucleic acids are DETECTED. The SARS-CoV-2 RNA is generally detectable in upper and lower  respiratory specimens dur ing the acute phase of infection.  Positive  results are indicative of active infection with SARS-CoV-2.  Clinical  correlation with patient history and other diagnostic information is  necessary to determine patient infection  status.  Positive results do  not rule out bacterial infection or co-infection with other viruses. If result is PRESUMPTIVE POSTIVE SARS-CoV-2 nucleic acids MAY BE PRESENT.   A presumptive positive result was obtained on the submitted specimen  and confirmed on repeat testing.  While 2019 novel coronavirus  (SARS-CoV-2) nucleic acids may be present in the submitted sample  additional confirmatory testing may be necessary for epidemiological  and / or clinical management purposes  to differentiate between  SARS-CoV-2 and other Sarbecovirus currently known to infect humans.  If clinically indicated additional testing with an alternate test  methodology [phone]) is advised. The SARS-CoV-2 RNA is generally  detectable in upper and lower respiratory sp ecimens during the acute   phase of infection. The expected result is Negative. Fact Sheet for Patients:  StrictlyIdeas.no Fact Sheet for Healthcare Providers: BankingDealers.co.za This test is not yet approved or cleared by the [place] FDA and has been authorized for detection and/or diagnosis of SARS-CoV-2 by FDA under an Emergency Use Authorization (EUA).  This EUA will remain in effect (meaning this test can be used) for the duration of the COVID-19 declaration under Section 564(b)(1) of the Act, 21 U.S.C. section 360bbb-3(b)(1), unless the authorization is terminated or revoked sooner. Performed at [facility], [address], [place], [place] [address]   Urine culture     Status: Abnormal   Collection Time: 04/10/19  6:20 PM   Specimen: In/Out Cath Urine  Result Value Ref Range Status   Specimen Description   Final    IN/OUT CATH URINE Performed at [facility], [address], [place], [place] [address]    Special Requests   Final    NONE Performed at [facility], [address] [place], [place] [address]    Culture (A)  Final    >=100,000 COLONIES/mL PROVIDENCIA RETTGERI >=100,000 COLONIES/mL ENTEROCOCCUS FAECALIS    Report Status 04/13/2019 FINAL  Final   Organism ID, Bacteria PROVIDENCIA RETTGERI (A)  Final   Organism ID, Bacteria ENTEROCOCCUS FAECALIS (A)  Final      Susceptibility   Enterococcus faecalis - MIC*    AMPICILLIN <=2 SENSITIVE Sensitive     LEVOFLOXACIN 1 SENSITIVE Sensitive     NITROFURANTOIN <=16 SENSITIVE Sensitive     VANCOMYCIN 1 SENSITIVE Sensitive     * >=100,000 COLONIES/mL ENTEROCOCCUS FAECALIS   Providencia rettgeri - MIC*    AMPICILLIN RESISTANT Resistant     CEFAZOLIN >=64 RESISTANT Resistant     CEFTRIAXONE <=1 SENSITIVE Sensitive     CIPROFLOXACIN <=0.25 SENSITIVE Sensitive     GENTAMICIN <=1 SENSITIVE Sensitive     IMIPENEM 1 SENSITIVE Sensitive     NITROFURANTOIN 256 RESISTANT Resistant      TRIMETH/SULFA <=20 SENSITIVE Sensitive     AMPICILLIN/SULBACTAM 16 INTERMEDIATE Intermediate     PIP/TAZO <=4 SENSITIVE Sensitive     * >=100,000 COLONIES/mL PROVIDENCIA RETTGERI     Labs: Basic Metabolic Panel: Recent Labs  Lab 04/10/19 1447 04/11/19 0555 04/12/19 0634 04/13/19 0438 04/15/19 0452  NA 141 141 138 138 138  K 3.8 4.2 3.9 4.1 4.1  CL 105 105 104 102 101  CO2 24 25 26 27 27   GLUCOSE 129* 123* 129* 132* 130*  BUN 28* 29* 26* 23 19  CREATININE 2.66* 2.22* 1.73* 1.52* 1.52*  CALCIUM 8.2* 8.8* 8.8* 8.8* 8.7*   Liver Function Tests: Recent Labs  Lab 04/10/19 1447  AST 20  ALT 13  ALKPHOS 46  BILITOT 0.9  PROT 7.1  ALBUMIN 3.6   CBC: Recent Labs  Lab 04/10/19 1447 04/11/19 0555 04/12/19 0634 04/13/19 0438 04/15/19 0452  WBC 15.5* 18.0* 15.3* 10.7* 9.7  NEUTROABS 12.3*  --   --   --   --   HGB 13.2 12.8* 13.7 14.2 12.8*  HCT 41.7 40.6 42.4 43.6 38.9*  MCV 98.3 98.5 96.8 96.0 95.1  PLT 143* 125* 111* 129* 196   CBG: Recent Labs  Lab 04/14/19 0802 04/14/19 1110 04/14/19 1557 04/15/19 0742 04/15/19 1111  GLUCAP 136* 179* 142* 109* 204*    Signed:  Barton Dubois MD.  [facility] 04/15/2019, 3:17 PM

## 2019-04-15 NOTE — Progress Notes (Addendum)
Physical Therapy Treatment Patient Details Name: Tony [name] MRN: [id number] DOB: 1951-06-30 Today's Date: 04/15/2019    History of Present Illness Tony [name] is a 68 y.o. male with medical history significant of history of seizures since age 56.  He has had a problem with dementia for a couple of years that is accelerating with a particular exacerbation after the death of his mother in the last 6-weeks.  He is [facility] in the [facility] and is able to live at home.  He has a disabled son with Huntington's disease who lives with him. Through [facility] a CNA comes to the home.  His primary caretaker is his brother [name].  He has had increasing dementia over the past several weeks as noted but otherwise has been medically stable.  On the day of admission he was found by his CNA to have mental status changes, urinary incontinence and a question of a possible seizure.  EMS was called and the patient was transported to the [facility] emergency department.  Patient cannot give a good history.    PT Comments    Patient cooperative and agreeable for therapy.  Patient demonstrates improvement for sitting up at bedside requiring less assistance, increased endurance/distance for ambulation in room and hallway without loss of balance, requires the use of RW due to unsteady on feet with tendency to lean on nearby objects for support when not using an AD, able to transfer to commode for a BM prior to gait training and afterwards.  Patient tolerated sitting up in chair after therapy to eat lunch - RN aware.  Patient will benefit from continued physical therapy in hospital and recommended venue below to increase strength, balance, endurance for safe ADLs and gait.    Follow Up Recommendations  Supervision/Assistance - 24 hour;Supervision for mobility/OOB     Equipment Recommendations  Rolling walker with 5" wheels    Recommendations for Other Services       Precautions / Restrictions Precautions Precautions:  Fall Restrictions Weight Bearing Restrictions: No    Mobility  Bed Mobility Overal bed mobility: Modified Independent Bed Mobility: Sit to Supine           General bed mobility comments: increased time, slightly labored movement  Transfers Overall transfer level: Needs assistance Equipment used: Rolling walker (2 wheeled) Transfers: Sit to/from [facility] Sit to Stand: Supervision;Min guard Stand pivot transfers: Supervision;Min guard       General transfer comment: slightly unsteady  Ambulation/Gait Ambulation/Gait assistance: Supervision Gait Distance (Feet): 100 Feet Assistive device: Rolling walker (2 wheeled) Gait Pattern/deviations: Decreased step length - left;Decreased stride length;Decreased step length - right Gait velocity: decreased   General Gait Details: demonstrates increased endurance/distance for ambulation with slightly unsteady cadence, no loss of balance   Stairs             Wheelchair Mobility    Modified Rankin (Stroke Patients Only)       Balance Overall balance assessment: Needs assistance Sitting-balance support: Feet supported;No upper extremity supported Sitting balance-Leahy Scale: Good     Standing balance support: During functional activity;No upper extremity supported Standing balance-Leahy Scale: Poor Standing balance comment: fair/poor without AD, fair/good with RW                            Cognition Arousal/Alertness: Awake/alert Behavior During Therapy: WFL for tasks assessed/performed Overall Cognitive Status: Within Functional Limits for tasks assessed  General Comments: Requires occasional verbal cueing to complete functional tasks      Exercises General Exercises - Lower Extremity Long Arc Quad: Seated;AROM;Strengthening;Both;10 reps Hip Flexion/Marching: Seated;AROM;Strengthening;Both;10 reps Toe Raises:  Seated;AROM;Strengthening;Both;10 reps Heel Raises: Seated;AROM;Strengthening;Both;10 reps    General Comments        Pertinent Vitals/Pain Pain Assessment: No/denies pain    Home Living                      Prior Function            PT Goals (current goals can now be found in the care plan section) Acute Rehab PT Goals Patient Stated Goal: return home with family to assist PT Goal Formulation: With patient Time For Goal Achievement: 04/18/19 Potential to Achieve Goals: Good Progress towards PT goals: Progressing toward goals    Frequency    Min 3X/week      PT Plan Current plan remains appropriate    Co-evaluation              AM-PAC PT "6 Clicks" Mobility   Outcome Measure  Help needed turning from your back to your side while in a flat bed without using bedrails?: None Help needed moving from lying on your back to sitting on the side of a flat bed without using bedrails?: None Help needed moving to and from a bed to a chair (including a wheelchair)?: A Little Help needed standing up from a chair using your arms (e.g., wheelchair or bedside chair)?: A Little Help needed to walk in hospital room?: A Little Help needed climbing 3-5 steps with a railing? : A Lot 6 Click Score: 19    End of Session   Activity Tolerance: Patient tolerated treatment well;Patient limited by fatigue Patient left: in chair;with call bell/phone within reach;with chair alarm set Nurse Communication: Mobility status PT Visit Diagnosis: Other abnormalities of gait and mobility (R26.89);Unsteadiness on feet (R26.81);Muscle weakness (generalized) (M62.81)     Time: 1103-1140 PT Time Calculation (min) (ACUTE ONLY): 37 min  Charges:  $Gait Training: 8-22 mins $Therapeutic Exercise: 8-22 mins                     12:33 PM, 04/15/19 Lonell Grandchild, MPT Physical Therapist with [facility] [phone] office [phone] mobile phone

## 2019-05-25 ENCOUNTER — Other Ambulatory Visit: Payer: Self-pay | Admitting: Urology

## 2019-06-01 NOTE — Patient Instructions (Signed)
DUE TO COVID-19 ONLY ONE VISITOR IS ALLOWED TO COME WITH YOU AND STAY IN THE WAITING ROOM ONLY DURING PRE OP AND PROCEDURE DAY OF SURGERY. THE 1 VISITOR MAY VISIT WITH YOU AFTER SURGERY IN YOUR PRIVATE ROOM DURING VISITING HOURS ONLY!  YOU NEED TO HAVE A COVID 19 TEST ON__11/7/20_____ @_______ , THIS TEST MUST BE DONE BEFORE SURGERY, COME  [name] , [id number].  ([place]) ONCE YOUR COVID TEST IS COMPLETED, PLEASE BEGIN THE QUARANTINE INSTRUCTIONS AS OUTLINED IN YOUR HANDOUT.                Tony [name]    Your procedure is scheduled on: 06/10/2019   Report to [facility] Main  Entrance   Report to admitting at 0900 AM     Call this number if you have problems the morning of surgery [phone]    Remember: Do not eat food or drink liquids :After Midnight. BRUSH YOUR TEETH MORNING OF SURGERY AND RINSE YOUR MOUTH OUT, NO CHEWING GUM CANDY OR MINTS.     Take these medicines the morning of surgery with A SIP OF WATER: Norvasc                                You may not have any metal on your body including hair pins and              piercings  Do not wear jewelry, make-up, lotions, powders or perfumes, deodorant             Do not wear nail polish on your fingernails.  Do not shave  48 hours prior to surgery.              Men may shave face and neck.   Do not bring valuables to the hospital. [name].  Contacts, dentures or bridgework may not be worn into surgery.  Leave suitcase in the car. After surgery it may be brought to your room.     [name] - Preparing for Surgery Before surgery, you can play an important role.  Because skin is not sterile, your skin needs to be as free of germs as possible.  You can reduce the number of germs on your skin by washing with CHG (chlorahexidine gluconate) soap before surgery.  CHG is an antiseptic cleaner which kills germs and bonds with the skin  to continue killing germs even after washing. Please DO NOT use if you have an allergy to CHG or antibacterial soaps.  If your skin becomes reddened/irritated stop using the CHG and inform your nurse when you arrive at Short Stay. Do not shave (including legs and underarms) for at least 48 hours prior to the first CHG shower.  You may shave your face/neck.  Please follow these instructions carefully:  1.  Shower with CHG Soap the night before surgery and the  morning of surgery.  2.  If you choose to wash your hair, wash your hair first as usual with your normal  shampoo.  3.  After you shampoo, rinse your hair and body thoroughly to remove the shampoo.                             4.  Use  CHG as you would any other liquid soap.  You can apply chg directly to the skin and wash.  Gently with a scrungie or clean washcloth.  5.  Apply the CHG Soap to your body ONLY FROM THE NECK DOWN.   Do   not use on face/ open                           Wound or open sores. Avoid contact with eyes, ears mouth and   genitals (private parts).                       Wash face,  Genitals (private parts) with your normal soap.             6.  Wash thoroughly, paying special attention to the area where your    surgery  will be performed.  7.  Thoroughly rinse your body with warm water from the neck down.  8.  DO NOT shower/wash with your normal soap after using and rinsing off the CHG Soap.                9.  Pat yourself dry with a clean towel.            10.  Wear clean pajamas.            11.  Place clean sheets on your bed the night of your first shower and do not  sleep with pets. Day of Surgery : Do not apply any lotions/deodorants the morning of surgery.  Please wear clean clothes to the hospital/surgery center.  FAILURE TO FOLLOW THESE INSTRUCTIONS MAY RESULT IN THE CANCELLATION OF YOUR SURGERY  PATIENT SIGNATURE_________________________________  NURSE  SIGNATURE__________________________________  ________________________________________________________________________

## 2019-06-05 ENCOUNTER — Encounter (HOSPITAL_COMMUNITY): Payer: Self-pay

## 2019-06-05 ENCOUNTER — Other Ambulatory Visit: Payer: Self-pay

## 2019-06-05 ENCOUNTER — Encounter (HOSPITAL_COMMUNITY)
Admission: RE | Admit: 2019-06-05 | Discharge: 2019-06-05 | Disposition: A | Discharge status: Home / Self Care | Payer: Medicare (Managed Care) | Source: Ambulatory Visit | Attending: Urology | Admitting: Urology

## 2019-06-05 DIAGNOSIS — Z01812 Encounter for preprocedural laboratory examination: Secondary | ICD-10-CM | POA: Diagnosis present

## 2019-06-05 DIAGNOSIS — N400 Enlarged prostate without lower urinary tract symptoms: Secondary | ICD-10-CM | POA: Insufficient documentation

## 2019-06-05 LAB — BASIC METABOLIC PANEL
Anion gap: 10 (ref 5–15)
BUN: 18 mg/dL (ref 8–23)
CO2: 26 mmol/L (ref 22–32)
Calcium: 9.5 mg/dL (ref 8.9–10.3)
Chloride: 101 mmol/L (ref 98–111)
Creatinine, Ser: 1.54 mg/dL — ABNORMAL HIGH (ref 0.61–1.24)
GFR calc Af Amer: 53 mL/min — ABNORMAL LOW (ref >60)
GFR calc non Af Amer: 45 mL/min — ABNORMAL LOW (ref >60)
Glucose, Bld: 171 mg/dL — ABNORMAL HIGH (ref 70–99)
Potassium: 4.1 mmol/L (ref 3.5–5.1)
Sodium: 137 mmol/L (ref 135–145)

## 2019-06-05 LAB — GLUCOSE, CAPILLARY: Glucose-Capillary: 181 mg/dL — ABNORMAL HIGH (ref 70–99)

## 2019-06-05 LAB — CBC
HCT: 41.9 % (ref 39.0–52.0)
Hemoglobin: 13.4 g/dL (ref 13.0–17.0)
MCH: 31.4 pg (ref 26.0–34.0)
MCHC: 32 g/dL (ref 30.0–36.0)
MCV: 98.1 fL (ref 80.0–100.0)
Platelets: 204 10*3/uL (ref 150–400)
RBC: 4.27 MIL/uL (ref 4.22–5.81)
RDW: 12.1 % (ref 11.5–15.5)
WBC: 6.8 10*3/uL (ref 4.0–10.5)
nRBC: 0 % (ref 0.0–0.2)

## 2019-06-05 LAB — ABO/RH: ABO/RH(D): A POS

## 2019-06-05 NOTE — Progress Notes (Signed)
PCP - Velna Ochs, MD Cardiologist -   Chest x-ray - 04/10/2019 EKG - 04/13/2019 Stress Test - n/a ECHO - n/a Cardiac Cath - n/a HgB A1C- 6.4- 04/11/2019  Sleep Study -  CPAP -   Pre-diabetic, CBG this AM 181. Fasting Blood Sugar -  Patient does not check blood sugar at home. Checks Blood Sugar _____ times a day  Blood Thinner Instructions:NONE Aspirin Instructions: Last Dose:  Anesthesia review: No review needed.  Patient denies shortness of breath, fever, cough and chest pain at PAT appointment   Patient verbalized understanding of instructions that were given to them at the PAT appointment. Patient was also instructed that they will need to review over the PAT instructions again at home before surgery.

## 2019-06-05 NOTE — Progress Notes (Signed)
SPOKE W/  _Roy     SCREENING SYMPTOMS OF COVID 19:   COUGH--NO  RUNNY NOSE--- NO  SORE THROAT---NO  NASAL CONGESTION----NO  SNEEZING----NO  SHORTNESS OF BREATH---NO  DIFFICULTY BREATHING---NO  TEMP >100.0 -----NO  UNEXPLAINED BODY ACHES------NO  CHILLS -------- NO  HEADACHES ---------NO  LOSS OF SMELL/ TASTE --------NO    HAVE YOU OR ANY FAMILY MEMBER TRAVELLED PAST 14 DAYS OUT OF THE   COUNTY---YES STATE----NO COUNTRY----NO  HAVE YOU OR ANY FAMILY MEMBER BEEN EXPOSED TO ANYONE WITH COVID 19? NO

## 2019-06-06 ENCOUNTER — Other Ambulatory Visit (HOSPITAL_COMMUNITY)
Admission: RE | Admit: 2019-06-06 | Discharge: 2019-06-06 | Disposition: A | Discharge status: Home / Self Care | Payer: Medicare (Managed Care) | Source: Ambulatory Visit | Attending: Urology | Admitting: Urology

## 2019-06-06 DIAGNOSIS — Z01812 Encounter for preprocedural laboratory examination: Secondary | ICD-10-CM | POA: Diagnosis present

## 2019-06-06 DIAGNOSIS — Z20828 Contact with and (suspected) exposure to other viral communicable diseases: Secondary | ICD-10-CM | POA: Diagnosis not present

## 2019-06-07 LAB — NOVEL CORONAVIRUS, NAA (HOSP ORDER, SEND-OUT TO REF LAB; TAT 18-24 HRS): SARS-CoV-2, NAA: NOT DETECTED

## 2019-06-08 ENCOUNTER — Ambulatory Visit (HOSPITAL_COMMUNITY): Payer: Medicare (Managed Care) | Admitting: Physician Assistant

## 2019-06-08 ENCOUNTER — Ambulatory Visit (HOSPITAL_COMMUNITY): Payer: Medicare (Managed Care) | Admitting: Certified Registered"

## 2019-06-10 ENCOUNTER — Ambulatory Visit (HOSPITAL_COMMUNITY)
Admission: RE | Admit: 2019-06-10 | Discharge: 2019-06-10 | Disposition: A | Discharge status: Home / Self Care | Payer: Medicare (Managed Care) | Source: Home / Self Care | Attending: Urology | Admitting: Urology

## 2019-06-10 ENCOUNTER — Encounter (HOSPITAL_COMMUNITY)
Admission: RE | Disposition: A | Discharge status: Home / Self Care | Payer: Self-pay | Source: Home / Self Care | Attending: Urology

## 2019-06-10 DIAGNOSIS — R972 Elevated prostate specific antigen [PSA]: Secondary | ICD-10-CM | POA: Insufficient documentation

## 2019-06-10 DIAGNOSIS — N401 Enlarged prostate with lower urinary tract symptoms: Secondary | ICD-10-CM | POA: Insufficient documentation

## 2019-06-10 DIAGNOSIS — A419 Sepsis, unspecified organism: Secondary | ICD-10-CM | POA: Insufficient documentation

## 2019-06-10 DIAGNOSIS — T83518A Infection and inflammatory reaction due to other urinary catheter, initial encounter: Secondary | ICD-10-CM | POA: Diagnosis not present

## 2019-06-10 DIAGNOSIS — F0390 Unspecified dementia without behavioral disturbance: Secondary | ICD-10-CM | POA: Insufficient documentation

## 2019-06-10 DIAGNOSIS — R509 Fever, unspecified: Secondary | ICD-10-CM | POA: Diagnosis not present

## 2019-06-10 DIAGNOSIS — Z79899 Other long term (current) drug therapy: Secondary | ICD-10-CM | POA: Insufficient documentation

## 2019-06-10 DIAGNOSIS — E7800 Pure hypercholesterolemia, unspecified: Secondary | ICD-10-CM | POA: Insufficient documentation

## 2019-06-10 DIAGNOSIS — E119 Type 2 diabetes mellitus without complications: Secondary | ICD-10-CM | POA: Insufficient documentation

## 2019-06-10 DIAGNOSIS — I10 Essential (primary) hypertension: Secondary | ICD-10-CM | POA: Insufficient documentation

## 2019-06-10 DIAGNOSIS — G40909 Epilepsy, unspecified, not intractable, without status epilepticus: Secondary | ICD-10-CM | POA: Insufficient documentation

## 2019-06-10 DIAGNOSIS — Z8673 Personal history of transient ischemic attack (TIA), and cerebral infarction without residual deficits: Secondary | ICD-10-CM | POA: Insufficient documentation

## 2019-06-10 DIAGNOSIS — R338 Other retention of urine: Secondary | ICD-10-CM | POA: Insufficient documentation

## 2019-06-10 DIAGNOSIS — Z538 Procedure and treatment not carried out for other reasons: Secondary | ICD-10-CM | POA: Insufficient documentation

## 2019-06-10 DIAGNOSIS — B952 Enterococcus as the cause of diseases classified elsewhere: Secondary | ICD-10-CM | POA: Insufficient documentation

## 2019-06-10 DIAGNOSIS — Z125 Encounter for screening for malignant neoplasm of prostate: Secondary | ICD-10-CM | POA: Insufficient documentation

## 2019-06-10 LAB — TYPE AND SCREEN
ABO/RH(D): A POS
Antibody Screen: NEGATIVE

## 2019-06-10 LAB — GLUCOSE, CAPILLARY: Glucose-Capillary: 154 mg/dL — ABNORMAL HIGH (ref 70–99)

## 2019-06-10 SURGERY — TURP (TRANSURETHRAL RESECTION OF PROSTATE)
Anesthesia: General

## 2019-06-10 MED ORDER — DEXAMETHASONE SODIUM PHOSPHATE 10 MG/ML IJ SOLN
INTRAMUSCULAR | Status: AC
  Filled 2019-06-10: qty 1

## 2019-06-10 MED ORDER — CEFAZOLIN SODIUM-DEXTROSE 2-4 GM/100ML-% IV SOLN
2.0000 g | INTRAVENOUS | Status: DC
  Filled 2019-06-10: qty 100

## 2019-06-10 MED ORDER — FENTANYL CITRATE (PF) 100 MCG/2ML IJ SOLN
INTRAMUSCULAR | Status: AC
  Filled 2019-06-10: qty 2

## 2019-06-10 MED ORDER — LIDOCAINE 2% (20 MG/ML) 5 ML SYRINGE
INTRAMUSCULAR | Status: AC
  Filled 2019-06-10: qty 5

## 2019-06-10 MED ORDER — PROPOFOL 10 MG/ML IV BOLUS
INTRAVENOUS | Status: AC
  Filled 2019-06-10: qty 20

## 2019-06-10 MED ORDER — ONDANSETRON HCL 4 MG/2ML IJ SOLN
INTRAMUSCULAR | Status: AC
  Filled 2019-06-10: qty 2

## 2019-06-10 MED ORDER — LACTATED RINGERS IV SOLN: INTRAVENOUS | Status: DC

## 2019-06-10 NOTE — Progress Notes (Signed)
Patient surgery cancelled due to eating this morning at 07:30.  Patient will be contacted by office.

## 2019-06-10 NOTE — Anesthesia Preprocedure Evaluation (Signed)
Anesthesia Evaluation  Patient identified by MRN, date of birth, ID band Patient awake    Reviewed: Allergy & Precautions, H&P , NPO status , Patient's Chart, lab work & pertinent test results  Airway Mallampati: II   Neck ROM: full    Dental   Pulmonary    breath sounds clear to auscultation       Cardiovascular hypertension,  Rhythm:regular Rate:Normal     Neuro/Psych Seizures -,  PSYCHIATRIC DISORDERS Dementia    GI/Hepatic   Endo/Other  diabetes, Type 2  Renal/GU Renal InsufficiencyRenal disease     Musculoskeletal   Abdominal   Peds  Hematology   Anesthesia Other Findings   Reproductive/Obstetrics                             Anesthesia Physical Anesthesia Plan  ASA: III  Anesthesia Plan: General   Post-op Pain Management:    Induction: Intravenous  PONV Risk Score and Plan: 2 and Ondansetron, Dexamethasone and Treatment may vary due to age or medical condition  Airway Management Planned: Oral ETT  Additional Equipment:   Intra-op Plan:   Post-operative Plan: Extubation in OR  Informed Consent: I have reviewed the patients History and Physical, chart, labs and discussed the procedure including the risks, benefits and alternatives for the proposed anesthesia with the patient or authorized representative who has indicated his/her understanding and acceptance.       Plan Discussed with: CRNA, Anesthesiologist and Surgeon  Anesthesia Plan Comments:         Anesthesia Quick Evaluation

## 2019-06-10 NOTE — H&P (Signed)
CC/HPI: CC: Urinary retention  HPI:   05/01/19: 68 year old male recently admitted to the hospital for UTI sepsis. Urine culture was positive for enterococcus and providentia. He completed Levaquin. He states that prior to the urinary tract infection, he was voiding well with tamsulosin. He had no complaints that time. Two weeks ago, he was found to have altered mental status and urinary incontinence and he was transported to the hospital. He was found to have UTI sepsis and urinary retention. He has had a Foley catheter since then.    05/07/19: Patient with above noted history. He returns today for TOV. He states that he has been tolerating the catheter well. He denies painful urgency or leakage. He denies gross hematuria, fevers, or chills. He has been using 0.8 mg Tamsulosin and tolerating increased dosage well.   05/22/2019  Patient failed his voiding trial last visit. He presents for another voiding trial and possible cystoscopy.     ALLERGIES: No Allergies    MEDICATIONS: Tamsulosin Hcl 0.4 mg capsule 2 capsule PO Daily  Amlodipine Besylate 5 mg tablet  Donepezil Hcl 5 mg tablet  Rosuvastatin Calcium 5 mg tablet  Vitamin D2     GU PSH: No GU PSH    NON-GU PSH: No Non-GU PSH    GU PMH: Urinary Retention - 05/01/2019    NON-GU PMH: Hypercholesterolemia Hypertension Seizure disorder Stroke/TIA    FAMILY HISTORY: 2 sons - Other Kidney Failure - Mother   SOCIAL HISTORY: Marital Status: Single Preferred Language: English; Race: White Drinks 2 caffeinated drinks per day.    REVIEW OF SYSTEMS:    GU Review Male:   Patient denies frequent urination, hard to postpone urination, burning/ pain with urination, get up at night to urinate, leakage of urine, stream starts and stops, trouble starting your stream, have to strain to urinate , erection problems, and penile pain.  Gastrointestinal (Upper):   Patient denies nausea, vomiting, and indigestion/ heartburn.  Gastrointestinal  (Lower):   Patient denies diarrhea and constipation.  Constitutional:   Patient denies fever, night sweats, weight loss, and fatigue.  Skin:   Patient denies skin rash/ lesion and itching.  Eyes:   Patient denies blurred vision and double vision.  Ears/ Nose/ Throat:   Patient denies sore throat and sinus problems.  Hematologic/Lymphatic:   Patient denies swollen glands and easy bruising.  Cardiovascular:   Patient denies leg swelling and chest pains.  Respiratory:   Patient denies cough and shortness of breath.  Endocrine:   Patient denies excessive thirst.  Musculoskeletal:   Patient denies joint pain and back pain.  Neurological:   Patient denies headaches and dizziness.  Psychologic:   Patient denies depression and anxiety.   VITAL SIGNS:      05/22/2019 10:16 AM  BP 115/72 mmHg  Heart Rate 89 /min  Temperature 97.3 F / 36.2 C   MULTI-SYSTEM PHYSICAL EXAMINATION:    Constitutional: Well-nourished. No physical deformities. Normally developed. Good grooming. Ambulates with walker   Respiratory: No labored breathing, no use of accessory muscles.   Cardiovascular: Normal temperature, adequate perfusion of extremities  Skin: No paleness, no jaundice  Neurologic / Psychiatric: Oriented to time, oriented to place, oriented to person. No depression, no anxiety, no agitation.  Gastrointestinal: No mass, no tenderness, no rigidity, non obese abdomen.  Eyes: Normal conjunctivae. Normal eyelids.  Musculoskeletal: Normal gait and station of head and neck.     PAST DATA REVIEWED:  Source Of History:  Patient  Records Review:  Previous Patient Records   PROCEDURES:         Flexible Cystoscopy - 52000  Risks, benefits, and some of the potential complications of the procedure were discussed at length with the patient including infection, bleeding, voiding discomfort, urinary retention, fever, chills, sepsis, and others. All questions were answered. Informed consent was obtained. Antibiotic  prophylaxis was given. Sterile technique and intraurethral analgesia were used.  Meatus:  Normal size. Normal location. Normal condition.  Urethra:  No strictures.  External Sphincter:  Normal.  Verumontanum:  Normal.  Prostate:  Obstructing. Enlarged median lobe. Severe hyperplasia. About 4-5 cm  Bladder Neck:  Non-obstructing.  Ureteral Orifices:  Normal location. Normal size. Normal shape.   Bladder:  No trabeculation. No tumors. Normal mucosa. No stones.      The lower urinary tract was carefully examined. The procedure was well-tolerated and without complications. Antibiotic instructions were given. Instructions were given to call the office immediately for bloody urine, difficulty urinating, urinary retention, painful or frequent urination, fever, chills, nausea, vomiting or other illness. The patient stated that he understood these instructions and would comply with them.        Voiding Trial - 51700  Instilled Volume: 200 cc  Voided Volume: 0 cc   ASSESSMENT:      ICD-10 Details  1 GU:   BPH w/LUTS - N40.1   2   Urinary Retention - R33.8 Stable  3   Encounter for Prostate Cancer screening - Z12.5    PLAN:           Orders Labs PSA          Document Letter(s):  Created for Patient: Clinical Summary         Notes:   PSA was drawn prior to cystoscopy.   The patient has failed medical management for his lower urinary tract symptoms. He would like to proceed with surgical resection. I discussed bipolar transurethral resection of the prostate. I specifically discussed the risks including but not limited to bleeding which could require blood transfusion, infection, and injury to surrounding structures. Also discussed the possibility that the surgery would not improve symptoms though most men have a great improvement in their symptoms. Also discussed the low likelihood but possibility of development of new symptoms such as irritative voiding symptoms or urinary incontinence. Most  men will have some degree of urinary urgency and discomfort immediately following the surgery that resolves in a short amount of time. He understands that most often this is an outpatient procedure but occasionally patients require hospitalization for continuous bladder irrigation in the event of excess bleeding. He also understands the possibility of being sent home with a urethral catheter. The patient expressed understanding and is eager to proceed.   We will proceed as long as PSA is normal. If PSA is elevated, recommend biopsy prior to proceeding.   CC: Linda Hedges    Signed by Link Snuffer, III, M.D. on 05/22/19 at 11:38 AM (EDT)       APPENDED NOTES:  PSA return mildly elevated at 6.11. I discussed this with his brother who is health care power-of-attorney. The patient does have advancing dementia. Life expectancy is likely less than 10 years. I discussed the option of biopsy versus just leaving it alone. He would like to observe. We will proceed with TURP and collect prostate chips for sampling.

## 2019-06-12 ENCOUNTER — Other Ambulatory Visit: Payer: Self-pay | Admitting: Urology

## 2019-06-12 ENCOUNTER — Emergency Department (HOSPITAL_COMMUNITY): Payer: Medicare (Managed Care)

## 2019-06-12 ENCOUNTER — Encounter (HOSPITAL_COMMUNITY): Payer: Self-pay

## 2019-06-12 ENCOUNTER — Inpatient Hospital Stay (HOSPITAL_COMMUNITY)
Admission: EM | Admit: 2019-06-12 | Discharge: 2019-06-27 | DRG: 698 | Disposition: A | Discharge status: Skilled Nursing Facility | Payer: Medicare (Managed Care) | Attending: Internal Medicine | Admitting: Internal Medicine

## 2019-06-12 ENCOUNTER — Other Ambulatory Visit: Payer: Self-pay

## 2019-06-12 DIAGNOSIS — A419 Sepsis, unspecified organism: Secondary | ICD-10-CM | POA: Diagnosis not present

## 2019-06-12 DIAGNOSIS — N401 Enlarged prostate with lower urinary tract symptoms: Secondary | ICD-10-CM | POA: Diagnosis not present

## 2019-06-12 DIAGNOSIS — L89152 Pressure ulcer of sacral region, stage 2: Secondary | ICD-10-CM | POA: Diagnosis present

## 2019-06-12 DIAGNOSIS — N281 Cyst of kidney, acquired: Secondary | ICD-10-CM | POA: Diagnosis present

## 2019-06-12 DIAGNOSIS — Z20828 Contact with and (suspected) exposure to other viral communicable diseases: Secondary | ICD-10-CM | POA: Diagnosis present

## 2019-06-12 DIAGNOSIS — N179 Acute kidney failure, unspecified: Secondary | ICD-10-CM | POA: Diagnosis not present

## 2019-06-12 DIAGNOSIS — R0600 Dyspnea, unspecified: Secondary | ICD-10-CM

## 2019-06-12 DIAGNOSIS — A4181 Sepsis due to Enterococcus: Secondary | ICD-10-CM | POA: Diagnosis present

## 2019-06-12 DIAGNOSIS — G309 Alzheimer's disease, unspecified: Secondary | ICD-10-CM | POA: Diagnosis present

## 2019-06-12 DIAGNOSIS — E869 Volume depletion, unspecified: Secondary | ICD-10-CM | POA: Diagnosis present

## 2019-06-12 DIAGNOSIS — T83518A Infection and inflammatory reaction due to other urinary catheter, initial encounter: Secondary | ICD-10-CM | POA: Diagnosis not present

## 2019-06-12 DIAGNOSIS — R52 Pain, unspecified: Secondary | ICD-10-CM

## 2019-06-12 DIAGNOSIS — G9341 Metabolic encephalopathy: Secondary | ICD-10-CM

## 2019-06-12 DIAGNOSIS — F0280 Dementia in other diseases classified elsewhere without behavioral disturbance: Secondary | ICD-10-CM | POA: Diagnosis present

## 2019-06-12 DIAGNOSIS — N419 Inflammatory disease of prostate, unspecified: Secondary | ICD-10-CM

## 2019-06-12 DIAGNOSIS — F0390 Unspecified dementia without behavioral disturbance: Secondary | ICD-10-CM | POA: Diagnosis present

## 2019-06-12 DIAGNOSIS — N390 Urinary tract infection, site not specified: Secondary | ICD-10-CM | POA: Diagnosis present

## 2019-06-12 DIAGNOSIS — E1122 Type 2 diabetes mellitus with diabetic chronic kidney disease: Secondary | ICD-10-CM | POA: Diagnosis present

## 2019-06-12 DIAGNOSIS — Z833 Family history of diabetes mellitus: Secondary | ICD-10-CM

## 2019-06-12 DIAGNOSIS — B9689 Other specified bacterial agents as the cause of diseases classified elsewhere: Secondary | ICD-10-CM | POA: Diagnosis present

## 2019-06-12 DIAGNOSIS — Y846 Urinary catheterization as the cause of abnormal reaction of the patient, or of later complication, without mention of misadventure at the time of the procedure: Secondary | ICD-10-CM | POA: Diagnosis present

## 2019-06-12 DIAGNOSIS — R509 Fever, unspecified: Secondary | ICD-10-CM | POA: Diagnosis not present

## 2019-06-12 DIAGNOSIS — I10 Essential (primary) hypertension: Secondary | ICD-10-CM | POA: Diagnosis present

## 2019-06-12 DIAGNOSIS — I129 Hypertensive chronic kidney disease with stage 1 through stage 4 chronic kidney disease, or unspecified chronic kidney disease: Secondary | ICD-10-CM | POA: Diagnosis present

## 2019-06-12 DIAGNOSIS — J9811 Atelectasis: Secondary | ICD-10-CM | POA: Diagnosis present

## 2019-06-12 DIAGNOSIS — Z79899 Other long term (current) drug therapy: Secondary | ICD-10-CM

## 2019-06-12 DIAGNOSIS — M609 Myositis, unspecified: Secondary | ICD-10-CM | POA: Diagnosis present

## 2019-06-12 DIAGNOSIS — E870 Hyperosmolality and hypernatremia: Secondary | ICD-10-CM | POA: Diagnosis present

## 2019-06-12 DIAGNOSIS — E785 Hyperlipidemia, unspecified: Secondary | ICD-10-CM | POA: Diagnosis present

## 2019-06-12 DIAGNOSIS — G40909 Epilepsy, unspecified, not intractable, without status epilepticus: Secondary | ICD-10-CM

## 2019-06-12 DIAGNOSIS — N1832 Chronic kidney disease, stage 3b: Secondary | ICD-10-CM | POA: Diagnosis present

## 2019-06-12 DIAGNOSIS — E875 Hyperkalemia: Secondary | ICD-10-CM | POA: Diagnosis present

## 2019-06-12 DIAGNOSIS — J189 Pneumonia, unspecified organism: Secondary | ICD-10-CM | POA: Diagnosis present

## 2019-06-12 DIAGNOSIS — J90 Pleural effusion, not elsewhere classified: Secondary | ICD-10-CM | POA: Diagnosis present

## 2019-06-12 DIAGNOSIS — L8990 Pressure ulcer of unspecified site, unspecified stage: Secondary | ICD-10-CM | POA: Insufficient documentation

## 2019-06-12 DIAGNOSIS — N138 Other obstructive and reflux uropathy: Secondary | ICD-10-CM | POA: Diagnosis present

## 2019-06-12 DIAGNOSIS — Y95 Nosocomial condition: Secondary | ICD-10-CM | POA: Diagnosis present

## 2019-06-12 DIAGNOSIS — N3090 Cystitis, unspecified without hematuria: Secondary | ICD-10-CM | POA: Diagnosis present

## 2019-06-12 DIAGNOSIS — Z8249 Family history of ischemic heart disease and other diseases of the circulatory system: Secondary | ICD-10-CM

## 2019-06-12 LAB — CBC WITH DIFFERENTIAL/PLATELET
Abs Immature Granulocytes: 0.03 10*3/uL (ref 0.00–0.07)
Basophils Absolute: 0 10*3/uL (ref 0.0–0.1)
Basophils Relative: 0 %
Eosinophils Absolute: 0 10*3/uL (ref 0.0–0.5)
Eosinophils Relative: 0 %
HCT: 43.2 % (ref 39.0–52.0)
Hemoglobin: 13.8 g/dL (ref 13.0–17.0)
Immature Granulocytes: 0 %
Lymphocytes Relative: 11 %
Lymphs Abs: 1.2 10*3/uL (ref 0.7–4.0)
MCH: 31.4 pg (ref 26.0–34.0)
MCHC: 31.9 g/dL (ref 30.0–36.0)
MCV: 98.2 fL (ref 80.0–100.0)
Monocytes Absolute: 1.9 10*3/uL — ABNORMAL HIGH (ref 0.1–1.0)
Monocytes Relative: 17 %
Neutro Abs: 8.6 10*3/uL — ABNORMAL HIGH (ref 1.7–7.7)
Neutrophils Relative %: 72 %
Platelets: 153 10*3/uL (ref 150–400)
RBC: 4.4 MIL/uL (ref 4.22–5.81)
RDW: 12 % (ref 11.5–15.5)
WBC: 11.8 10*3/uL — ABNORMAL HIGH (ref 4.0–10.5)
nRBC: 0 % (ref 0.0–0.2)

## 2019-06-12 LAB — COMPREHENSIVE METABOLIC PANEL
ALT: 17 U/L (ref 0–44)
AST: 30 U/L (ref 15–41)
Albumin: 3.7 g/dL (ref 3.5–5.0)
Alkaline Phosphatase: 54 U/L (ref 38–126)
Anion gap: 12 (ref 5–15)
BUN: 31 mg/dL — ABNORMAL HIGH (ref 8–23)
CO2: 26 mmol/L (ref 22–32)
Calcium: 9.6 mg/dL (ref 8.9–10.3)
Chloride: 100 mmol/L (ref 98–111)
Creatinine, Ser: 2.06 mg/dL — ABNORMAL HIGH (ref 0.61–1.24)
GFR calc Af Amer: 37 mL/min — ABNORMAL LOW (ref >60)
GFR calc non Af Amer: 32 mL/min — ABNORMAL LOW (ref >60)
Glucose, Bld: 163 mg/dL — ABNORMAL HIGH (ref 70–99)
Potassium: 5.6 mmol/L — ABNORMAL HIGH (ref 3.5–5.1)
Sodium: 138 mmol/L (ref 135–145)
Total Bilirubin: 1.2 mg/dL (ref 0.3–1.2)
Total Protein: 8.4 g/dL — ABNORMAL HIGH (ref 6.5–8.1)

## 2019-06-12 LAB — LACTIC ACID, PLASMA
Lactic Acid, Venous: 2.7 mmol/L (ref 0.5–1.9)
Lactic Acid, Venous: 2.8 mmol/L (ref 0.5–1.9)
Lactic Acid, Venous: 2.2 mmol/L (ref 0.5–1.9)

## 2019-06-12 LAB — APTT: aPTT: 27 seconds (ref 24–36)

## 2019-06-12 LAB — SARS CORONAVIRUS 2 (TAT 6-24 HRS): SARS Coronavirus 2: NEGATIVE

## 2019-06-12 LAB — PROTIME-INR
INR: 1 (ref 0.8–1.2)
Prothrombin Time: 13.1 seconds (ref 11.4–15.2)

## 2019-06-12 MED ORDER — ONDANSETRON HCL 4 MG PO TABS: 4.0000 mg | ORAL_TABLET | Freq: Four times a day (QID) | ORAL | Status: DC | PRN

## 2019-06-12 MED ORDER — ACETAMINOPHEN 325 MG PO TABS
650.0000 mg | ORAL_TABLET | Freq: Four times a day (QID) | ORAL | Status: DC | PRN
  Administered 2019-06-15 – 2019-06-25 (×9): 650 mg via ORAL
  Filled 2019-06-12 (×8): qty 2

## 2019-06-12 MED ORDER — ACETAMINOPHEN 650 MG RE SUPP
650.0000 mg | Freq: Four times a day (QID) | RECTAL | Status: DC | PRN
  Filled 2019-06-12: qty 1

## 2019-06-12 MED ORDER — ROSUVASTATIN CALCIUM 5 MG PO TABS
5.0000 mg | ORAL_TABLET | Freq: Every day | ORAL | Status: DC
  Administered 2019-06-12 – 2019-06-25 (×13): 5 mg via ORAL
  Filled 2019-06-12 (×14): qty 1

## 2019-06-12 MED ORDER — SODIUM CHLORIDE 0.9 % IV SOLN: 1.0000 g | Freq: Once | INTRAVENOUS | Status: DC

## 2019-06-12 MED ORDER — VANCOMYCIN HCL IN DEXTROSE 1-5 GM/200ML-% IV SOLN: 1000.0000 mg | Freq: Once | INTRAVENOUS | Status: DC

## 2019-06-12 MED ORDER — HEPARIN SODIUM (PORCINE) 5000 UNIT/ML IJ SOLN
5000.0000 [IU] | Freq: Three times a day (TID) | INTRAMUSCULAR | Status: DC
  Administered 2019-06-12 – 2019-06-27 (×43): 5000 [IU] via SUBCUTANEOUS
  Filled 2019-06-12 (×44): qty 1

## 2019-06-12 MED ORDER — SODIUM CHLORIDE 0.9 % IV SOLN
2.0000 g | Freq: Once | INTRAVENOUS | Status: AC
  Administered 2019-06-12 (×2): 2 g via INTRAVENOUS
  Filled 2019-06-12: qty 2

## 2019-06-12 MED ORDER — DIVALPROEX SODIUM ER 500 MG PO TB24
1500.0000 mg | ORAL_TABLET | Freq: Every day | ORAL | Status: DC
  Administered 2019-06-12 – 2019-06-26 (×14): 1500 mg via ORAL
  Filled 2019-06-12 (×16): qty 3

## 2019-06-12 MED ORDER — AMLODIPINE BESYLATE 5 MG PO TABS
5.0000 mg | ORAL_TABLET | Freq: Every day | ORAL | Status: DC
  Administered 2019-06-12 – 2019-06-27 (×16): 5 mg via ORAL
  Filled 2019-06-12 (×16): qty 1

## 2019-06-12 MED ORDER — TAMSULOSIN HCL 0.4 MG PO CAPS
0.8000 mg | ORAL_CAPSULE | Freq: Every day | ORAL | Status: DC
  Administered 2019-06-12 – 2019-06-26 (×14): 0.8 mg via ORAL
  Filled 2019-06-12 (×15): qty 2

## 2019-06-12 MED ORDER — SODIUM CHLORIDE 0.9 % IV SOLN
INTRAVENOUS | Status: DC
  Administered 2019-06-12 – 2019-06-15 (×6): via INTRAVENOUS

## 2019-06-12 MED ORDER — SODIUM CHLORIDE 0.9 % IV BOLUS
1000.0000 mL | Freq: Once | INTRAVENOUS | Status: AC
  Administered 2019-06-12: 14:00:00 1000 mL via INTRAVENOUS

## 2019-06-12 MED ORDER — METRONIDAZOLE IN NACL 5-0.79 MG/ML-% IV SOLN
500.0000 mg | Freq: Once | INTRAVENOUS | Status: DC
  Administered 2019-06-12: 500 mg via INTRAVENOUS
  Filled 2019-06-12: qty 100

## 2019-06-12 MED ORDER — SODIUM CHLORIDE 0.9 % IV SOLN
Freq: Once | INTRAVENOUS | Status: AC
  Administered 2019-06-12: 18:00:00 via INTRAVENOUS

## 2019-06-12 MED ORDER — SODIUM CHLORIDE 0.9 % IV SOLN
2.0000 g | Freq: Two times a day (BID) | INTRAVENOUS | Status: DC
  Administered 2019-06-12 – 2019-06-13 (×2): 2 g via INTRAVENOUS
  Filled 2019-06-12 (×2): qty 2

## 2019-06-12 MED ORDER — SODIUM CHLORIDE 0.9 % IV BOLUS
1000.0000 mL | Freq: Once | INTRAVENOUS | Status: AC
  Administered 2019-06-12: 1000 mL via INTRAVENOUS

## 2019-06-12 MED ORDER — DONEPEZIL HCL 10 MG PO TABS
5.0000 mg | ORAL_TABLET | Freq: Every day | ORAL | Status: DC
  Administered 2019-06-12 – 2019-06-26 (×14): 5 mg via ORAL
  Filled 2019-06-12 (×15): qty 1

## 2019-06-12 MED ORDER — VANCOMYCIN HCL IN DEXTROSE 1-5 GM/200ML-% IV SOLN
1000.0000 mg | INTRAVENOUS | Status: DC
  Administered 2019-06-12: 1000 mg via INTRAVENOUS
  Filled 2019-06-12: qty 200

## 2019-06-12 MED ORDER — ONDANSETRON HCL 4 MG/2ML IJ SOLN: 4.0000 mg | Freq: Four times a day (QID) | INTRAMUSCULAR | Status: DC | PRN

## 2019-06-12 NOTE — Progress Notes (Signed)
Notified provider of need to order repeat lactic acid as second lactic came back higher than the first, and current CMS guidelines request draws until a downward trend happens.

## 2019-06-12 NOTE — ED Notes (Signed)
Spoke with Skylar Sien (brother) [phone] cell or [phone] work.

## 2019-06-12 NOTE — ED Provider Notes (Addendum)
[facility] EMERGENCY DEPARTMENT Provider Note   CSN: [id number] Arrival date & time: 06/12/19  1031     History   Chief Complaint Chief Complaint  Patient presents with  . Chills    HPI Tony [name] is a 68 y.o. male.     Level five caveat secondary to acuity of condition.  Chief complaint chills and strong smelling urine in catheter bag.  Additionally had a large loose bowel movement in the emergency department.  He is hypertensive, but otherwise stable vital signs.  Past medical history includes Alzheimer's, diabetes, hypertension, seizure disorder, CKD.     Past Medical History:  Diagnosis Date  . Alzheimer's disease ([place])   . BPH (benign prostatic hyperplasia)   . CKD (chronic kidney disease)   . Diabetes mellitus    Type II, diagnosed 2011, not on insulin; admitted in 2011 for hyperglycemia  . HTN (hypertension)   . Seizure disorder ([place])    diagnosed in childhood, last seizure was years ago    Patient Active Problem List   Diagnosis Date Noted  . Sepsis with acute renal failure without septic shock ([place])   . Acute urinary retention   . UTI (urinary tract infection) 04/10/2019  . Acute UTI 04/10/2019  . Seizure ([place]) 12/15/2018  . CAP (community acquired pneumonia) 12/14/2018  . Dementia ([place]) 10/20/2018  . Enlarged prostate without lower urinary tract symptoms ([place]) 06/30/2018  . Hyperprolactinemia ([place]) 11/20/2017  . HLD (hyperlipidemia) 09/02/2016  . Chronic kidney disease 03/19/2016  . Gynecomastia, male 04/25/2015  . Routine health maintenance 04/25/2015  . Erectile disorder due to medical condition in male patient 05/07/2014  . Hypertension 12/12/2011  . Prediabetes 12/12/2011  . Seizure disorder ([place]) 12/12/2011    Past Surgical History:  Procedure Laterality Date  . NO PAST SURGERIES          Home Medications    Prior to Admission medications   Medication Sig Start Date End Date Taking? Authorizing Provider  amLODipine (NORVASC) 5  MG tablet Take 1 tablet (5 mg total) by mouth daily. 04/15/19 06/12/19 Yes Shah, Pratik D, DO  divalproex (DEPAKOTE ER) 500 MG 24 hr tablet Take 2 tablets (1,000 mg total) by mouth daily. Patient taking differently: Take 1,500 mg by mouth at bedtime.  12/16/18  Yes Masoudi, Elhamalsadat, MD  donepezil (ARICEPT) 5 MG tablet Take 1 tablet (5 mg total) by mouth at bedtime. 12/16/18  Yes Masoudi, Elhamalsadat, MD  lisinopril (ZESTRIL) 20 MG tablet Take 20 mg by mouth daily.   Yes [provider]  rosuvastatin (CRESTOR) 20 MG tablet Take 1 tablet (20 mg total) by mouth at bedtime. Patient taking differently: Take 5 mg by mouth at bedtime.  12/16/18 12/16/19 Yes Masoudi, Elhamalsadat, MD  tamsulosin (FLOMAX) 0.4 MG CAPS capsule Take 1 capsule (0.4 mg total) by mouth daily after breakfast. Patient taking differently: Take 0.8 mg by mouth at bedtime.  12/16/18  Yes Masoudi, Elhamalsadat, MD  Vitamin D, Ergocalciferol, (DRISDOL) 1.25 MG (50000 UT) CAPS capsule Take 50,000 Units by mouth once a week. Friday   Yes [provider]  cabergoline (DOSTINEX) 0.5 MG tablet Take 1 tablet (0.5 mg total) by mouth daily. Patient not taking: Reported on 06/01/2019 12/16/18   Axel Filler, MD  levofloxacin (LEVAQUIN) 500 MG tablet Take 1 tablet (500 mg total) by mouth daily. Patient not taking: Reported on 06/01/2019 04/15/19   Barton Dubois, MD    Family History Family History  Problem Relation Age of Onset  .  Diabetes Mother   . Hypertension Mother     Social History Social History   Tobacco Use  . Smoking status: Never Smoker  . Smokeless tobacco: Never Used  Substance Use Topics  . Alcohol use: No    Alcohol/week: 0.0 standard drinks  . Drug use: No     Allergies   Patient has no known allergies.   Review of Systems Review of Systems  Unable to perform ROS: Acuity of condition     Physical Exam Updated Vital Signs BP 139/76   Pulse (!) 104   Temp (!) 100.9 F (38.3  C) (Rectal)   Resp 13   Ht 5\' 11"  (1.803 m)   Wt 97 kg   SpO2 98%   BMI 29.83 kg/m   Physical Exam Vitals signs and nursing note reviewed.  Constitutional:      Appearance: He is well-developed.     Comments: Able to talk; appears to have chills.  HENT:     Head: Normocephalic and atraumatic.  Eyes:     Conjunctiva/sclera: Conjunctivae normal.  Neck:     Musculoskeletal: Neck supple.  Cardiovascular:     Rate and Rhythm: Normal rate and regular rhythm.  Pulmonary:     Effort: Pulmonary effort is normal.     Breath sounds: Normal breath sounds.  Abdominal:     General: Bowel sounds are normal.     Palpations: Abdomen is soft.  Genitourinary:    Comments: Catheter in place Musculoskeletal: Normal range of motion.  Skin:    General: Skin is warm and dry.  Neurological:     General: No focal deficit present.     Mental Status: He is oriented to person, place, and time.  Psychiatric:        Behavior: Behavior normal.      ED Treatments / Results  Labs (all labs ordered are listed, but only abnormal results are displayed) Labs Reviewed  COMPREHENSIVE METABOLIC PANEL - Abnormal; Notable for the following components:      Result Value   Potassium 5.6 (*)    Glucose, Bld 163 (*)    BUN 31 (*)    Creatinine, Ser 2.06 (*)    Total Protein 8.4 (*)    GFR calc non Af Amer 32 (*)    GFR calc Af Amer 37 (*)    All other components within normal limits  LACTIC ACID, PLASMA - Abnormal; Notable for the following components:   Lactic Acid, Venous 2.7 (*)    All other components within normal limits  CBC WITH DIFFERENTIAL/PLATELET - Abnormal; Notable for the following components:   WBC 11.8 (*)    Neutro Abs 8.6 (*)    Monocytes Absolute 1.9 (*)    All other components within normal limits  CULTURE, BLOOD (ROUTINE X 2)  CULTURE, BLOOD (ROUTINE X 2)  SARS CORONAVIRUS 2 (TAT 6-24 HRS)  URINE CULTURE  APTT  PROTIME-INR  LACTIC ACID, PLASMA  CBC WITH  DIFFERENTIAL/PLATELET  URINALYSIS, ROUTINE W REFLEX MICROSCOPIC    EKG EKG Interpretation  Date/Time:  Friday June 12 2019 10:49:53 EST Ventricular Rate:  109 PR Interval:    QRS Duration: 79 QT Interval:  299 QTC Calculation: 403 R Axis:   9 Text Interpretation: Sinus tachycardia Abnormal R-wave progression, early transition Confirmed by Nat Christen [phone]) on 06/12/2019 11:00:28 AM Also confirmed by Nat Christen [phone])  on 06/12/2019 1:14:42 PM   Radiology Dg Chest Portable 1 View  Result Date: 06/12/2019 CLINICAL DATA:  Fever.  EXAM: PORTABLE CHEST 1 VIEW COMPARISON:  April 10, 2019. FINDINGS: The heart size and mediastinal contours are within normal limits. Both lungs are clear. The visualized skeletal structures are unremarkable. IMPRESSION: No active disease. Electronically Signed   By: Marijo Conception M.D.   On: 06/12/2019 11:04    Procedures Procedures (including critical care time)  Medications Ordered in ED Medications  metroNIDAZOLE (FLAGYL) IVPB 500 mg (500 mg Intravenous New Bag/Given 06/12/19 1339)  vancomycin (VANCOCIN) IVPB 1000 mg/200 mL premix (has no administration in time range)  sodium chloride 0.9 % bolus 1,000 mL (0 mLs Intravenous Stopped 06/12/19 1336)  sodium chloride 0.9 % bolus 1,000 mL (1,000 mLs Intravenous New Bag/Given 06/12/19 1337)  ceFEPIme (MAXIPIME) 2 g in sodium chloride 0.9 % 100 mL IVPB (0 g Intravenous Stopped 06/12/19 1336)     Initial Impression / Assessment and Plan / ED Course  I have reviewed the triage vital signs and the nursing notes.  Pertinent labs & imaging results that were available during my care of the patient were reviewed by me and considered in my medical decision making (see chart for details).       Will initiate code sepsis.  IV fluids, IV antibiotics, admission.  CRITICAL CARE Performed by: Nat Christen Total critical care time: 30 minutes Critical care time was exclusive of separately billable  procedures and treating other patients. Critical care was necessary to treat or prevent imminent or life-threatening deterioration. Critical care was time spent personally by me on the following activities: development of treatment plan with patient and/or surrogate as well as nursing, discussions with consultants, evaluation of patient's response to treatment, examination of patient, obtaining history from patient or surrogate, ordering and performing treatments and interventions, ordering and review of laboratory studies, ordering and review of radiographic studies, pulse oximetry and re-evaluation of patient's condition.  Tony [name] was evaluated in Emergency Department on 06/12/2019 for the symptoms described in the history of present illness. He was evaluated in the context of the global COVID-19 pandemic, which necessitated consideration that the patient might be at risk for infection with the SARS-CoV-2 virus that causes COVID-19. Institutional protocols and algorithms that pertain to the evaluation of patients at risk for COVID-19 are in a state of rapid change based on information released by regulatory bodies including the CDC and federal and state organizations. These policies and algorithms were followed during the patient's care in the ED. Final Clinical Impressions(s) / ED Diagnoses   Final diagnoses:  Sepsis, due to unspecified organism, unspecified whether acute organ dysfunction present [facility])    ED Discharge Orders    None       Nat Christen, MD 06/12/19 1211    Nat Christen, MD 06/12/19 [phone]

## 2019-06-12 NOTE — ED Notes (Signed)
Lab completed blood culture draw.

## 2019-06-12 NOTE — Progress Notes (Signed)
Pharmacy Antibiotic Note  Tony [name] is a 68 y.o. male admitted on 06/12/2019 with unknown source.  Pharmacy has been consulted for Cefepime dosing.  Plan: Cefepime 2gm IV q12h F/U cxs and clinical progress Monitor V/S, labs  Height: 5\' 11"  (180.3 cm) Weight: 213 lb 13.5 oz (97 kg) IBW/kg (Calculated) : 75.3  Temp (24hrs), Avg:100 F (37.8 C), Min:99 F (37.2 C), Max:100.9 F (38.3 C)  Recent Labs  Lab 06/12/19 1218 06/12/19 1304 06/12/19 1434  WBC  --  11.8*  --   CREATININE 2.06*  --   --   LATICACIDVEN 2.7*  --  2.8*    Estimated Creatinine Clearance: 40.2 mL/min (A) (by C-G formula based on SCr of 2.06 mg/dL (H)).    No Known Allergies  Antimicrobials this admission: Cefepime 11/13>>  Vancomycin x 1 dose given in ED 11/13  Microbiology results: 11/13 BCx: pending 11/13 UCx: pending 11/13 SARS-2 CV is pending   Thank you for allowing pharmacy to be a part of this patient's care.  Isac Sarna, BS [place], [place] Clinical Pharmacist Pager [phone] 06/12/2019 3:26 PM

## 2019-06-12 NOTE — ED Notes (Signed)
Lab attempting to obtain blood cultures  

## 2019-06-12 NOTE — ED Notes (Signed)
Informed Dr Dyann Kief, Pts leg bag changed and will collect urine from clean bag for UA and urine culture.

## 2019-06-12 NOTE — ED Notes (Signed)
Pt had large loose bm upon arrival.  Pt cleaned, bed sheets changed.

## 2019-06-12 NOTE — ED Notes (Signed)
Dr Lacinda Axon aware, not able to obtain 2nd IV site.

## 2019-06-12 NOTE — H&P (Signed)
History and Physical    SEM MCCAUGHEY [id number] DOB: Jul 10, 1951 DOA: 06/12/2019  Referring MD/NP/PA: Dr. Lacinda Axon PCP: Velna Ochs, MD  Patient coming from: Home  Chief Complaint: Chills/fever, general malaise  HPI: Tony [name] is a 68 y.o. male with a past medical history significant for Alzheimer's dementia, BPH with lower urinary tract symptoms, chronic kidney disease stage III, hypertension, hyperlipidemia and seizure disorder; who presented to the hospital secondary to chills and not feeling well.  After reviewing her records he was recently seen for cystoscopy and voiding trial after experiencing urinary retention with the need of Foley catheter.  Patient denies nausea, vomiting, abdominal pain, cough, headaches, focal weakness, hematochezia, hematemesis, melena or any other complaints.  In the ED work-up demonstrated positive fever, tachycardia, no acute cardiopulmonary process on chest x-ray; urinalysis suggesting UTI with moderate leukocytes esterase and cloudy appearance; elevated lactic acid and acute on chronic renal failure.  Patient received fluid resuscitation, cultures taken and antibiotics started.  TRH called admit patient for further evaluation and management.  Past Medical/Surgical History: Past Medical History:  Diagnosis Date  . Alzheimer's disease ([place])   . BPH (benign prostatic hyperplasia)   . CKD (chronic kidney disease)   . Diabetes mellitus    Type II, diagnosed 2011, not on insulin; admitted in 2011 for hyperglycemia  . HTN (hypertension)   . Seizure disorder ([place])    diagnosed in childhood, last seizure was years ago    Past Surgical History:  Procedure Laterality Date  . NO PAST SURGERIES      Social History:  reports that he has never smoked. He has never used smokeless tobacco. He reports that he does not drink alcohol or use drugs.  Allergies: No Known Allergies  Family History:  Family History  Problem Relation Age of Onset  .  Diabetes Mother   . Hypertension Mother     Prior to Admission medications   Medication Sig Start Date End Date Taking? Authorizing Provider  amLODipine (NORVASC) 5 MG tablet Take 1 tablet (5 mg total) by mouth daily. 04/15/19 06/12/19 Yes Shah, Pratik D, DO  divalproex (DEPAKOTE ER) 500 MG 24 hr tablet Take 2 tablets (1,000 mg total) by mouth daily. Patient taking differently: Take 1,500 mg by mouth at bedtime.  12/16/18  Yes Masoudi, Elhamalsadat, MD  donepezil (ARICEPT) 5 MG tablet Take 1 tablet (5 mg total) by mouth at bedtime. 12/16/18  Yes Masoudi, Elhamalsadat, MD  lisinopril (ZESTRIL) 20 MG tablet Take 20 mg by mouth daily.   Yes [provider]  rosuvastatin (CRESTOR) 20 MG tablet Take 1 tablet (20 mg total) by mouth at bedtime. Patient taking differently: Take 5 mg by mouth at bedtime.  12/16/18 12/16/19 Yes Masoudi, Elhamalsadat, MD  tamsulosin (FLOMAX) 0.4 MG CAPS capsule Take 1 capsule (0.4 mg total) by mouth daily after breakfast. Patient taking differently: Take 0.8 mg by mouth at bedtime.  12/16/18  Yes Masoudi, Elhamalsadat, MD  Vitamin D, Ergocalciferol, (DRISDOL) 1.25 MG (50000 UT) CAPS capsule Take 50,000 Units by mouth once a week. Friday   Yes [provider]  cabergoline (DOSTINEX) 0.5 MG tablet Take 1 tablet (0.5 mg total) by mouth daily. Patient not taking: Reported on 06/01/2019 12/16/18   Axel Filler, MD    Review of Systems:  Large loose stool in the emergency department; otherwise negative except as mentioned in HPI.  Limited history and credibility given patient's underlying dementia.  Physical Exam: Vitals:   06/12/19 1500 06/12/19 1615 06/12/19  1621 06/12/19 1650  BP: (!) 143/107  129/76 (!) 169/86  Pulse: (!) 112  (!) 106 (!) 101  Resp: '14  20 18  ' Temp:  99.5 F (37.5 C)  98.8 F (37.1 C)  TempSrc:  Oral  Oral  SpO2: 97%  95% 99%  Weight:      Height:        Constitutional: NAD, calm, comfortable, warm to touch, no chest  pain.  No nausea or vomiting. Eyes: PERRL, lids and conjunctivae normal, no icterus.   ENMT: Mucous membranes are moist. Posterior pharynx clear of any exudate or lesions. Neck: normal, supple, no masses, no thyromegaly, no JVD. Respiratory: clear to auscultation bilaterally, no wheezing, no crackles. Normal respiratory effort. No accessory muscle use.  Cardiovascular: Mild sinus tachycardia, regular rate and.  No rubs, no gallops, no murmurs appreciated on exam. No extremity edema. 2+ pedal pulses. No carotid bruits.  Abdomen: no tenderness, no masses palpated. No hepatosplenomegaly. Bowel sounds positive.  Musculoskeletal: no clubbing / cyanosis. No joint deformity upper and lower extremities. Good ROM, no contractures. Normal muscle tone.  Skin: no rashes, lesions, ulcers. No induration Neurologic: CN 2-12 grossly intact. Sensation intact, DTR normal. Strength 5/5 in all 4.  Psychiatric: Impaired in the setting of underlying dementia. Normal mood.    Labs on Admission: I have personally reviewed the following labs and imaging studies  CBC: Recent Labs  Lab 06/12/19 1304  WBC 11.8*  NEUTROABS 8.6*  HGB 13.8  HCT 43.2  MCV 98.2  PLT 915   Basic Metabolic Panel: Recent Labs  Lab 06/12/19 1218  NA 138  K 5.6*  CL 100  CO2 26  GLUCOSE 163*  BUN 31*  CREATININE 2.06*  CALCIUM 9.6   GFR: Estimated Creatinine Clearance: 40.2 mL/min (A) (by C-G formula based on SCr of 2.06 mg/dL (H)).   Liver Function Tests: Recent Labs  Lab 06/12/19 1218  AST 30  ALT 17  ALKPHOS 54  BILITOT 1.2  PROT 8.4*  ALBUMIN 3.7   Coagulation Profile: Recent Labs  Lab 06/12/19 1304  INR 1.0    Recent Labs  Lab 06/10/19 0920  GLUCAP 154*   Urine analysis:    Component Value Date/Time   COLORURINE YELLOW 04/10/2019 1820   APPEARANCEUR CLOUDY (A) 04/10/2019 1820   LABSPEC 1.013 04/10/2019 1820   PHURINE 9.0 (H) 04/10/2019 1820   GLUCOSEU NEGATIVE 04/10/2019 1820   HGBUR LARGE (A)  04/10/2019 1820   BILIRUBINUR NEGATIVE 04/10/2019 1820   KETONESUR NEGATIVE 04/10/2019 1820   PROTEINUR 100 (A) 04/10/2019 1820   UROBILINOGEN 0.2 02/07/2010 1547   NITRITE NEGATIVE 04/10/2019 1820   LEUKOCYTESUR MODERATE (A) 04/10/2019 1820    Recent Results (from the past 240 hour(s))  Novel Coronavirus, NAA (Hosp order, Send-out to Ref Lab; TAT 18-24 hrs     Status: None   Collection Time: 06/06/19  5:47 PM   Specimen: Nasopharyngeal Swab; Respiratory  Result Value Ref Range Status   SARS-CoV-2, NAA NOT DETECTED NOT DETECTED Final    Comment: (NOTE) This nucleic acid amplification test was developed and its performance characteristics determined by [employer]. Nucleic acid amplification tests include PCR and TMA. This test has not been FDA cleared or approved. This test has been authorized by FDA under an Emergency Use Authorization (EUA). This test is only authorized for the duration of time the declaration that circumstances exist justifying the authorization of the emergency use of in vitro diagnostic tests for detection of SARS-CoV-2 virus and/or  diagnosis of COVID-19 infection under section 564(b)(1) of the Act, 21 U.S.C. 121FXJ-8(I) (1), unless the authorization is terminated or revoked sooner. When diagnostic testing is negative, the possibility of a false negative result should be considered in the context of a patient's recent exposures and the presence of clinical signs and symptoms consistent with COVID-19. An individual without symptoms of COVID- 19 and who is not shedding SARS-CoV-2 vi rus would expect to have a negative (not detected) result in this assay. Performed At: [facility] [address] [place], [place] [id number] Rush Farmer MD BR:[id number]    [place]  Final    Comment: Performed at [facility], [place] [address], [place], [place] [address]  Culture, blood (Routine x 2)     Status: None  (Preliminary result)   Collection Time: 06/12/19 12:02 PM   Specimen: BLOOD RIGHT HAND  Result Value Ref Range Status   Specimen Description BLOOD RIGHT HAND  Final   Special Requests   Final    BOTTLES DRAWN AEROBIC AND ANAEROBIC Blood Culture results may not be optimal due to an inadequate volume of blood received in culture bottles Performed at [facility], [address], [place], [place] [address]    Culture PENDING  Incomplete   Report Status PENDING  Incomplete  Culture, blood (Routine x 2)     Status: None (Preliminary result)   Collection Time: 06/12/19 12:18 PM   Specimen: BLOOD RIGHT HAND  Result Value Ref Range Status   Specimen Description BLOOD RIGHT ARM  Final   Special Requests   Final    BOTTLES DRAWN AEROBIC AND ANAEROBIC Blood Culture results may not be optimal due to an inadequate volume of blood received in culture bottles Performed at [facility], [address], [place], [place] [address]    Culture PENDING  Incomplete   Report Status PENDING  Incomplete     Radiological Exams on Admission: Dg Chest Portable 1 View  Result Date: 06/12/2019 CLINICAL DATA:  Fever. EXAM: PORTABLE CHEST 1 VIEW COMPARISON:  April 10, 2019. FINDINGS: The heart size and mediastinal contours are within normal limits. Both lungs are clear. The visualized skeletal structures are unremarkable. IMPRESSION: No active disease. Electronically Signed   By: Marijo Conception M.D.   On: 06/12/2019 11:04    EKG: Independently reviewed.  Normal QT, sinus tachycardia; no acute ischemic changes.  Assessment/Plan 1-sepsis secondary to UTI -Patient met criteria on admission for sepsis with elevated WBCs, tachycardia, acute on chronic renal failure, presumed source of infection his urine and elevated temperature. -IV fluid resuscitation has been given 2.5 L and patient has been On 75 cc/h -Lactic acid 2.7--2.8 and it will be further trend. -Cultures have been taken and will follow response. -As  needed antipyretic has been ordered -Patient with complicated process as he has BPH with lower urinary tract symptoms and chronic Foley catheter; pending TURP procedure in December 2020. -Given no nausea vomiting anticipate quick turnaround with aggressive hydration and IV antibiotics; hopefully discharge home to complete oral management of this acute infection in the next 24 hours or so.  It disease unable to be achieved patient status will be changed to inpatient and further treatment will be continued.  2-Hypertension -Continue Norvasc.  3-Seizure disorder (HCC) -No active seizure appreciated currently -Continue the use of Depakote  4-hyperkalemia -In the setting of acute on chronic renal failure and the use of ACE inhibitors. -Holding ACE inhibitor at this moment -Will provide fluid resuscitation -Monitoring  on telemetry -No need for Lokelma or Kayexalate as expecting that good hydration will take care of a normal potassium level. -Repeat basic metabolic panel in a.m.  5-HLD (hyperlipidemia) -Continue statins  6-acute on chronic renal failure -Baseline 3B stage -Creatinine 2.06 on presentation and patient with a creatinine of 1.5 at baseline. -Hold/minimize nephrotoxic agents -Provide fluid resuscitation and treat UTI -Follow renal function trend.  7-dementia -Continue supportive care and Aricept.  8-BPH -Chronic Foley in place -Actively followed by urologist service -Continue Flomax. -Patient is scheduled for TURP in December 2020 by Dr. Gloriann Loan.    DVT prophylaxis: Heparin Code Status: Full code Family Communication: No family at bedside on my evaluation. Disposition Plan: Anticipate discharge back home once infection and acute renal failure control. Consults called: None Admission status: Observation, length of stay 8 hopefully less than 2 midnights; telemetry bed.   Time Spent: 70 minutes  Barton Dubois MD [facility] Pager [phone]     06/12/2019, 4:55 PM

## 2019-06-12 NOTE — ED Notes (Addendum)
Date and time results received: 06/12/19 12:48 PM   Test: Lactic Acid Critical Value: 2.7  Name of Provider Notified:Dr. Lacinda Axon  Orders Received? Or Actions Taken? See orders

## 2019-06-12 NOTE — ED Triage Notes (Signed)
EMS reports was called out because pt has strong smelling urine in leg bag and having chills.  Pt alert, oriented to self and place.  Denies any pain.  CBG with ems was 220, temp 97.7, hr 108, bp 160/110.  Reports pt was scheduled to have a surgery but was postponed.

## 2019-06-12 NOTE — ED Notes (Signed)
Spoke with Dr Dyann Kief, ok to continue Vancomycin since it was opened but will d/c following this dose.

## 2019-06-13 DIAGNOSIS — Z20828 Contact with and (suspected) exposure to other viral communicable diseases: Secondary | ICD-10-CM | POA: Diagnosis present

## 2019-06-13 DIAGNOSIS — F0280 Dementia in other diseases classified elsewhere without behavioral disturbance: Secondary | ICD-10-CM | POA: Diagnosis present

## 2019-06-13 DIAGNOSIS — N3090 Cystitis, unspecified without hematuria: Secondary | ICD-10-CM | POA: Diagnosis present

## 2019-06-13 DIAGNOSIS — A4181 Sepsis due to Enterococcus: Secondary | ICD-10-CM | POA: Diagnosis present

## 2019-06-13 DIAGNOSIS — I129 Hypertensive chronic kidney disease with stage 1 through stage 4 chronic kidney disease, or unspecified chronic kidney disease: Secondary | ICD-10-CM | POA: Diagnosis present

## 2019-06-13 DIAGNOSIS — T83518A Infection and inflammatory reaction due to other urinary catheter, initial encounter: Secondary | ICD-10-CM | POA: Diagnosis present

## 2019-06-13 DIAGNOSIS — G40909 Epilepsy, unspecified, not intractable, without status epilepticus: Secondary | ICD-10-CM | POA: Diagnosis present

## 2019-06-13 DIAGNOSIS — Y95 Nosocomial condition: Secondary | ICD-10-CM | POA: Diagnosis present

## 2019-06-13 DIAGNOSIS — Z515 Encounter for palliative care: Secondary | ICD-10-CM | POA: Diagnosis not present

## 2019-06-13 DIAGNOSIS — M79609 Pain in unspecified limb: Secondary | ICD-10-CM | POA: Diagnosis not present

## 2019-06-13 DIAGNOSIS — R6520 Severe sepsis without septic shock: Secondary | ICD-10-CM | POA: Diagnosis not present

## 2019-06-13 DIAGNOSIS — B9689 Other specified bacterial agents as the cause of diseases classified elsewhere: Secondary | ICD-10-CM | POA: Diagnosis present

## 2019-06-13 DIAGNOSIS — F0390 Unspecified dementia without behavioral disturbance: Secondary | ICD-10-CM | POA: Diagnosis not present

## 2019-06-13 DIAGNOSIS — R509 Fever, unspecified: Secondary | ICD-10-CM | POA: Diagnosis present

## 2019-06-13 DIAGNOSIS — N179 Acute kidney failure, unspecified: Secondary | ICD-10-CM | POA: Diagnosis present

## 2019-06-13 DIAGNOSIS — L89152 Pressure ulcer of sacral region, stage 2: Secondary | ICD-10-CM

## 2019-06-13 DIAGNOSIS — N390 Urinary tract infection, site not specified: Secondary | ICD-10-CM | POA: Diagnosis not present

## 2019-06-13 DIAGNOSIS — N138 Other obstructive and reflux uropathy: Secondary | ICD-10-CM | POA: Diagnosis present

## 2019-06-13 DIAGNOSIS — E785 Hyperlipidemia, unspecified: Secondary | ICD-10-CM

## 2019-06-13 DIAGNOSIS — M609 Myositis, unspecified: Secondary | ICD-10-CM | POA: Diagnosis present

## 2019-06-13 DIAGNOSIS — I10 Essential (primary) hypertension: Secondary | ICD-10-CM | POA: Diagnosis not present

## 2019-06-13 DIAGNOSIS — J9811 Atelectasis: Secondary | ICD-10-CM | POA: Diagnosis present

## 2019-06-13 DIAGNOSIS — A419 Sepsis, unspecified organism: Secondary | ICD-10-CM | POA: Diagnosis not present

## 2019-06-13 DIAGNOSIS — L8990 Pressure ulcer of unspecified site, unspecified stage: Secondary | ICD-10-CM | POA: Insufficient documentation

## 2019-06-13 DIAGNOSIS — E875 Hyperkalemia: Secondary | ICD-10-CM | POA: Diagnosis present

## 2019-06-13 DIAGNOSIS — J189 Pneumonia, unspecified organism: Secondary | ICD-10-CM | POA: Diagnosis present

## 2019-06-13 DIAGNOSIS — E870 Hyperosmolality and hypernatremia: Secondary | ICD-10-CM | POA: Diagnosis present

## 2019-06-13 DIAGNOSIS — E1122 Type 2 diabetes mellitus with diabetic chronic kidney disease: Secondary | ICD-10-CM | POA: Diagnosis present

## 2019-06-13 DIAGNOSIS — N419 Inflammatory disease of prostate, unspecified: Secondary | ICD-10-CM | POA: Diagnosis present

## 2019-06-13 DIAGNOSIS — N1832 Chronic kidney disease, stage 3b: Secondary | ICD-10-CM | POA: Diagnosis present

## 2019-06-13 DIAGNOSIS — G309 Alzheimer's disease, unspecified: Secondary | ICD-10-CM | POA: Diagnosis present

## 2019-06-13 DIAGNOSIS — G9341 Metabolic encephalopathy: Secondary | ICD-10-CM | POA: Diagnosis present

## 2019-06-13 DIAGNOSIS — Y846 Urinary catheterization as the cause of abnormal reaction of the patient, or of later complication, without mention of misadventure at the time of the procedure: Secondary | ICD-10-CM | POA: Diagnosis present

## 2019-06-13 DIAGNOSIS — N401 Enlarged prostate with lower urinary tract symptoms: Secondary | ICD-10-CM | POA: Diagnosis present

## 2019-06-13 DIAGNOSIS — J90 Pleural effusion, not elsewhere classified: Secondary | ICD-10-CM | POA: Diagnosis present

## 2019-06-13 DIAGNOSIS — Z7189 Other specified counseling: Secondary | ICD-10-CM | POA: Diagnosis not present

## 2019-06-13 LAB — URINALYSIS, ROUTINE W REFLEX MICROSCOPIC
Bilirubin Urine: NEGATIVE
Glucose, UA: NEGATIVE mg/dL
Ketones, ur: NEGATIVE mg/dL
Nitrite: POSITIVE — AB
Protein, ur: 100 mg/dL — AB
RBC / HPF: >50 RBC/hpf — ABNORMAL HIGH (ref 0–5)
Specific Gravity, Urine: 1.015 (ref 1.005–1.030)
WBC, UA: >50 WBC/hpf — ABNORMAL HIGH (ref 0–5)
pH: 5 (ref 5.0–8.0)

## 2019-06-13 LAB — BASIC METABOLIC PANEL
Anion gap: 13 (ref 5–15)
BUN: 43 mg/dL — ABNORMAL HIGH (ref 8–23)
CO2: 24 mmol/L (ref 22–32)
Calcium: 9.3 mg/dL (ref 8.9–10.3)
Chloride: 104 mmol/L (ref 98–111)
Creatinine, Ser: 2.98 mg/dL — ABNORMAL HIGH (ref 0.61–1.24)
GFR calc Af Amer: 24 mL/min — ABNORMAL LOW (ref >60)
GFR calc non Af Amer: 20 mL/min — ABNORMAL LOW (ref >60)
Glucose, Bld: 172 mg/dL — ABNORMAL HIGH (ref 70–99)
Potassium: 5.5 mmol/L — ABNORMAL HIGH (ref 3.5–5.1)
Sodium: 141 mmol/L (ref 135–145)

## 2019-06-13 LAB — CBC
HCT: 47.8 % (ref 39.0–52.0)
Hemoglobin: 15.1 g/dL (ref 13.0–17.0)
MCH: 31.5 pg (ref 26.0–34.0)
MCHC: 31.6 g/dL (ref 30.0–36.0)
MCV: 99.8 fL (ref 80.0–100.0)
Platelets: 176 10*3/uL (ref 150–400)
RBC: 4.79 MIL/uL (ref 4.22–5.81)
RDW: 12.3 % (ref 11.5–15.5)
WBC: 18.9 10*3/uL — ABNORMAL HIGH (ref 4.0–10.5)
nRBC: 0 % (ref 0.0–0.2)

## 2019-06-13 MED ORDER — SODIUM CHLORIDE 0.9 % IV SOLN
2.0000 g | INTRAVENOUS | Status: DC
  Administered 2019-06-14 – 2019-06-16 (×3): 2 g via INTRAVENOUS
  Filled 2019-06-13 (×3): qty 2

## 2019-06-13 MED ORDER — VANCOMYCIN HCL IN DEXTROSE 1-5 GM/200ML-% IV SOLN: 1000.0000 mg | INTRAVENOUS | Status: DC

## 2019-06-13 MED ORDER — VANCOMYCIN HCL IN DEXTROSE 1-5 GM/200ML-% IV SOLN
1000.0000 mg | INTRAVENOUS | Status: DC
  Administered 2019-06-13 – 2019-06-14 (×2): 1000 mg via INTRAVENOUS
  Filled 2019-06-13: qty 200

## 2019-06-13 MED ORDER — SODIUM ZIRCONIUM CYCLOSILICATE 5 G PO PACK
5.0000 g | PACK | Freq: Two times a day (BID) | ORAL | Status: AC
  Administered 2019-06-13 (×2): 5 g via ORAL
  Filled 2019-06-13 (×2): qty 1

## 2019-06-13 MED ORDER — CHLORHEXIDINE GLUCONATE CLOTH 2 % EX PADS
6.0000 | MEDICATED_PAD | Freq: Every day | CUTANEOUS | Status: DC
  Administered 2019-06-13 – 2019-06-27 (×13): 6 via TOPICAL

## 2019-06-13 NOTE — Progress Notes (Addendum)
PROGRESS NOTE    Tony [name]  [id number] DOB: [birth year] DOA: 06/12/2019 PCP: Velna Ochs, MD     Brief Narrative:  68 y.o. male with a past medical history significant for Alzheimer's dementia, BPH with lower urinary tract symptoms, chronic kidney disease stage III, hypertension, hyperlipidemia and seizure disorder; who presented to the hospital secondary to chills and not feeling well.  After reviewing her records he was recently seen for cystoscopy and voiding trial after experiencing urinary retention with the need of Foley catheter.  Patient denies nausea, vomiting, abdominal pain, cough, headaches, focal weakness, hematochezia, hematemesis, melena or any other complaints.  In the ED work-up demonstrated positive fever, tachycardia, no acute cardiopulmonary process on chest x-ray; urinalysis suggesting UTI with moderate leukocytes esterase and cloudy appearance; elevated lactic acid and acute on chronic renal failure.  Patient received fluid resuscitation, cultures taken and antibiotics started.  [facility] called admit patient for further evaluation and management.   Assessment & Plan: 1-sepsis secondary to UTI and concern for bacteremia -Change Foley catheter as recommended by urology service. -Continue current IV antibiotics (vancomycin and cefepime). -Follow urine and blood culture results -Continue IV fluids -Will check renal ultrasound. -Fever curve trending down; WBCs still elevated.  Lactic acid has improved and going in the right direction.  2-acute on chronic renal failure -Stage IIIb chronic kidney disease at baseline -Will check renal ultrasound -Continue IV fluids -Minimize the use of nephrotoxic agents.  3-Hypertension -Stable overall -Continue current antihypertensive regimen and heart healthy diet.  4-Seizure disorder ([place]) -No seizure activity appreciated -Continue Depakote.  5-HLD (hyperlipidemia) -Continue statins.  6-Dementia ([place]) -Continue  the use of Aricept and supportive care -No behavioral disturbances appreciated at this moment.  7-Complicated UTI (urinary tract infection), associated to indwelling Foley catheter usage. -Catheter will be exchanged -Continue current antibiotics -Follow culture results -Per urology recommendations will initiate local anesthetic acid irrigations twice a day at time of discharge -Outpatient follow-up with urology service and TURP procedure to be done after 2 weeks of antibiotics completed.  8-BPH -continue flomax.  9-stage II pressure injury -frequent repositioning -preventive measures.    DVT prophylaxis: Heparin Code Status: Full code Family Communication: Spoke with brother over the phone. Disposition Plan: Continue IV antibiotics, continue IV fluids; will check renal ultrasound.  Exchange Foley catheter as recommended by urology service.  Follow culture results.  Patient still spiking fever  Consultants:   Urology service curbside (Dr. Diona Fanti) decision has been made to exchange Foley catheter, continue current antibiotic therapy, follow culture reports and at time of discharge most likely use of 0.25% acetic acid irrigations twice a day.  Procedures:   See below for x-ray reports.  Antimicrobials:  Anti-infectives (From admission, onward)   Start     Dose/Rate Route Frequency Ordered Stop   06/14/19 1400  vancomycin (VANCOCIN) IVPB 1000 mg/200 mL premix  Status:  Discontinued     1,000 mg 200 mL/hr over 60 Minutes Intravenous Every 24 hours 06/13/19 1102 06/13/19 1111   06/14/19 0912  ceFEPIme (MAXIPIME) 2 g in sodium chloride 0.9 % 100 mL IVPB     2 g 200 mL/hr over 30 Minutes Intravenous Every 24 hours 06/13/19 1111     06/13/19 1600  vancomycin (VANCOCIN) IVPB 1000 mg/200 mL premix     1,000 mg 200 mL/hr over 60 Minutes Intravenous Every 24 hours 06/13/19 1111     06/13/19 1130  vancomycin (VANCOCIN) IVPB 1000 mg/200 mL premix  Status:  Discontinued     1,000  mg  200 mL/hr over 60 Minutes Intravenous Every 1 hr x 2 06/13/19 1102 06/13/19 1109   06/12/19 2200  ceFEPIme (MAXIPIME) 2 g in sodium chloride 0.9 % 100 mL IVPB  Status:  Discontinued     2 g 200 mL/hr over 30 Minutes Intravenous Every 12 hours 06/12/19 1530 06/13/19 1111   06/12/19 1300  vancomycin (VANCOCIN) IVPB 1000 mg/200 mL premix  Status:  Discontinued     1,000 mg 200 mL/hr over 60 Minutes Intravenous Every 1 hr x 2 06/12/19 1215 06/12/19 1431   06/12/19 1215  ceFEPIme (MAXIPIME) 2 g in sodium chloride 0.9 % 100 mL IVPB     2 g 200 mL/hr over 30 Minutes Intravenous  Once 06/12/19 1207 06/12/19 1336   06/12/19 1215  metroNIDAZOLE (FLAGYL) IVPB 500 mg  Status:  Discontinued     500 mg 100 mL/hr over 60 Minutes Intravenous  Once 06/12/19 1207 06/12/19 1442   06/12/19 1215  vancomycin (VANCOCIN) IVPB 1000 mg/200 mL premix  Status:  Discontinued     1,000 mg 200 mL/hr over 60 Minutes Intravenous  Once 06/12/19 1207 06/12/19 1215   06/12/19 1200  cefTRIAXone (ROCEPHIN) 1 g in sodium chloride 0.9 % 100 mL IVPB  Status:  Discontinued     1 g 200 mL/hr over 30 Minutes Intravenous  Once 06/12/19 1154 06/12/19 1207      Subjective: Positive fever, no chest pain, no nausea vomiting.  Lethargic and pleasantly confused.  Objective: Vitals:   06/12/19 1621 06/12/19 1650 06/12/19 2010 06/13/19 0528  BP: 129/76 (!) 169/86  (!) 143/80  Pulse: (!) 106 (!) 101  (!) 102  Resp: 20 18  14   Temp:  98.8 F (37.1 C)  98.5 F (36.9 C)  TempSrc:  Oral  Oral  SpO2: 95% 99% 99% 96%  Weight:      Height:        Intake/Output Summary (Last 24 hours) at 06/13/2019 1503 Last data filed at 06/13/2019 1300 Gross per 24 hour  Intake 0 ml  Output -  Net 0 ml   Filed Weights   06/12/19 1039  Weight: 97 kg    Examination: General exam: Alert, awake, oriented x 2 intermittently; spiking fever, no nausea, no vomiting.  Denies chest pain. Respiratory system: Clear to auscultation. Respiratory  effort normal. Cardiovascular system: S1 and S2; No murmurs, rubs, gallops. Gastrointestinal system: Abdomen is nondistended, soft and nontender. No organomegaly or masses felt. Normal bowel sounds heard. Central nervous system: Alert and oriented. No focal neurological deficits. Extremities: No cyanosis or clubbing. Skin: Stage II sacral wound without superimposed infection; stage II pressure injury posterior aspect of his scrotum.  Foley catheter in place. Psychiatry: Judgement and insight appear normal. Mood & affect appropriate.    Data Reviewed: I have personally reviewed following labs and imaging studies  CBC: Recent Labs  Lab 06/12/19 1304 06/13/19 0754  WBC 11.8* 18.9*  NEUTROABS 8.6*  --   HGB 13.8 15.1  HCT 43.2 47.8  MCV 98.2 99.8  PLT 153 0000000   Basic Metabolic Panel: Recent Labs  Lab 06/12/19 1218 06/13/19 0754  NA 138 141  K 5.6* 5.5*  CL 100 104  CO2 26 24  GLUCOSE 163* 172*  BUN 31* 43*  CREATININE 2.06* 2.98*  CALCIUM 9.6 9.3   GFR: Estimated Creatinine Clearance: 27.8 mL/min (A) (by C-G formula based on SCr of 2.98 mg/dL (H)).   Liver Function Tests: Recent Labs  Lab 06/12/19 1218  AST  30  ALT 17  ALKPHOS 54  BILITOT 1.2  PROT 8.4*  ALBUMIN 3.7   Coagulation Profile: Recent Labs  Lab 06/12/19 1304  INR 1.0   CBG: Recent Labs  Lab 06/10/19 0920  GLUCAP 154*   Urine analysis:    Component Value Date/Time   COLORURINE YELLOW 06/12/2019 1045   APPEARANCEUR CLOUDY (A) 06/12/2019 1045   LABSPEC 1.015 06/12/2019 1045   PHURINE 5.0 06/12/2019 1045   GLUCOSEU NEGATIVE 06/12/2019 1045   HGBUR LARGE (A) 06/12/2019 1045   BILIRUBINUR NEGATIVE 06/12/2019 1045   KETONESUR NEGATIVE 06/12/2019 1045   PROTEINUR 100 (A) 06/12/2019 1045   UROBILINOGEN 0.2 02/07/2010 1547   NITRITE POSITIVE (A) 06/12/2019 1045   LEUKOCYTESUR LARGE (A) 06/12/2019 1045    Recent Results (from the past 240 hour(s))  Novel Coronavirus, NAA (Hosp order,  Send-out to Ref Lab; TAT 18-24 hrs     Status: None   Collection Time: 06/06/19  5:47 PM   Specimen: Nasopharyngeal Swab; Respiratory  Result Value Ref Range Status   SARS-CoV-2, NAA NOT DETECTED NOT DETECTED Final    Comment: (NOTE) This nucleic acid amplification test was developed and its performance characteristics determined by [employer]. Nucleic acid amplification tests include PCR and TMA. This test has not been FDA cleared or approved. This test has been authorized by FDA under an Emergency Use Authorization (EUA). This test is only authorized for the duration of time the declaration that circumstances exist justifying the authorization of the emergency use of in vitro diagnostic tests for detection of SARS-CoV-2 virus and/or diagnosis of COVID-19 infection under section 564(b)(1) of the Act, 21 U.S.C. [id number]) (1), unless the authorization is terminated or revoked sooner. When diagnostic testing is negative, the possibility of a false negative result should be considered in the context of a patient's recent exposures and the presence of clinical signs and symptoms consistent with COVID-19. An individual without symptoms of COVID- 19 and who is not shedding SARS-CoV-2 vi rus would expect to have a negative (not detected) result in this assay. Performed At: [facility] [address] [place], [place] [id number] Rush Farmer MD [id number]    [place]  Final    Comment: Performed at [facility], [place] [address], [place], [place] [address]  SARS CORONAVIRUS 2 (TAT 6-24 HRS) Nasopharyngeal Nasopharyngeal Swab     Status: None   Collection Time: 06/12/19 11:57 AM   Specimen: Nasopharyngeal Swab  Result Value Ref Range Status   SARS Coronavirus 2 NEGATIVE NEGATIVE Final    Comment: (NOTE) SARS-CoV-2 target nucleic acids are NOT DETECTED. The SARS-CoV-2 RNA is generally detectable in upper and lower respiratory specimens  during the acute phase of infection. Negative results do not preclude SARS-CoV-2 infection, do not rule out co-infections with other pathogens, and should not be used as the sole basis for treatment or other patient management decisions. Negative results must be combined with clinical observations, patient history, and epidemiological information. The expected result is Negative. Fact Sheet for Patients: SugarRoll.be Fact Sheet for Healthcare Providers: https://www.woods-mathews.com/ This test is not yet approved or cleared by the [place] FDA and  has been authorized for detection and/or diagnosis of SARS-CoV-2 by FDA under an Emergency Use Authorization (EUA). This EUA will remain  in effect (meaning this test can be used) for the duration of the COVID-19 declaration under Section 56 4(b)(1) of the Act, 21 U.S.C. section 360bbb-3(b)(1), unless the authorization is terminated or revoked sooner. Performed at [facility]  [facility], [place] [address], [place], [place] [address]   Culture, blood (Routine x 2)     Status: None (Preliminary result)   Collection Time: 06/12/19 12:02 PM   Specimen: BLOOD RIGHT HAND  Result Value Ref Range Status   Specimen Description BLOOD RIGHT HAND  Final   Special Requests   Final    BOTTLES DRAWN AEROBIC AND ANAEROBIC Blood Culture results may not be optimal due to an inadequate volume of blood received in culture bottles   Culture   Final    NO GROWTH < 24 HOURS Performed at [facility], [address], [place], [place] [address]    Report Status PENDING  Incomplete  Culture, blood (Routine x 2)     Status: None (Preliminary result)   Collection Time: 06/12/19 12:18 PM   Specimen: BLOOD RIGHT ARM  Result Value Ref Range Status   Specimen Description   Final    BLOOD RIGHT ARM Performed at [facility], [address], [place], [place] [address]    Special Requests   Final    BOTTLES DRAWN AEROBIC AND  ANAEROBIC Blood Culture results may not be optimal due to an inadequate volume of blood received in culture bottles Performed at [facility], [address], [place], [place] [address]    Culture  Setup Time   Final    [place] Gram Stain Report Called to,Read Back By and Verified With: JACKSON @ 1003 ON [id number] BY HENDERSON L. Performed at [facility], [address] [place], [place] [address]    Culture [facility] POSITIVE COCCI  Final   Report Status PENDING  Incomplete     Radiology Studies: Dg Chest Portable 1 View  Result Date: 06/12/2019 CLINICAL DATA:  Fever. EXAM: PORTABLE CHEST 1 VIEW COMPARISON:  April 10, 2019. FINDINGS: The heart size and mediastinal contours are within normal limits. Both lungs are clear. The visualized skeletal structures are unremarkable. IMPRESSION: No active disease. Electronically Signed   By: Marijo Conception M.D.   On: 06/12/2019 11:04    Scheduled Meds: . amLODipine  5 mg Oral Daily  . Chlorhexidine Gluconate Cloth  6 each Topical Daily  . divalproex  1,500 mg Oral QHS  . donepezil  5 mg Oral QHS  . heparin injection (subcutaneous)  5,000 Units Subcutaneous Q8H  . rosuvastatin  5 mg Oral QHS  . sodium zirconium cyclosilicate  5 g Oral BID  . tamsulosin  0.8 mg Oral QHS   Continuous Infusions: . sodium chloride 75 mL/hr at 06/13/19 0646  . [START ON 06/14/2019] ceFEPime (MAXIPIME) IV    . vancomycin       LOS: 0 days    Time spent: 30 minutes.   Barton Dubois, MD [facility] Pager [phone]  06/13/2019, 3:03 PM

## 2019-06-13 NOTE — Progress Notes (Signed)
Pharmacy Antibiotic Note  Tony [name] is a 68 y.o. male admitted on 06/12/2019 with unknown source.  Pharmacy has been consulted for Cefepime and Vancomycin dosing. 1 of 2 BCX + GPC Vancomycin loading dose given in ED 11/13. Scr increased Plan: Continue Vancomycin 1000mg  IV q24h. Goal tr: 15-59mcg/ml Change Cefepime 2gm IV q24h F/U cxs and clinical progress Monitor V/S, labs  Height: 5\' 11"  (180.3 cm) Weight: 213 lb 13.5 oz (97 kg) IBW/kg (Calculated) : 75.3  Temp (24hrs), Avg:99.3 F (37.4 C), Min:98.5 F (36.9 C), Max:100.5 F (38.1 C)  Recent Labs  Lab 06/12/19 1218 06/12/19 1304 06/12/19 1434 06/12/19 1805 06/13/19 0754  WBC  --  11.8*  --   --  18.9*  CREATININE 2.06*  --   --   --  2.98*  LATICACIDVEN 2.7*  --  2.8* 2.2*  --     Estimated Creatinine Clearance: 27.8 mL/min (A) (by C-G formula based on SCr of 2.98 mg/dL (H)).   Normalized CrCl is 60mls/min No Known Allergies  Antimicrobials this admission: Cefepime 11/13>>  Vancomycin 11/13>>  Microbiology results: 11/13 BCx: 1 of 2 + GPC 11/13 UCx: pending 11/13 SARS-2 CV is negative  Thank you for allowing pharmacy to be a part of this patient's care.  Isac Sarna, BS Pharm D, [place] Clinical Pharmacist Pager [phone] 06/13/2019 11:02 AM

## 2019-06-13 NOTE — Progress Notes (Addendum)
16 french foley replaced by students and instructor today.  Urine in tube yellow but murky.  Patient has been sleeping most of day. Contacted Dr. Dyann Kief

## 2019-06-13 NOTE — Progress Notes (Signed)
Lab called with anaerobic culture showing gram positive cocci.  Contacted Dr. Dyann Kief

## 2019-06-13 NOTE — Consult Note (Signed)
I spoke w/ Dr Dyann Kief regarding this pt--BPH/retention, indwelling foley w/ recurrent UTI/sepsis. TURP scheduled for 12/11 by Dr Gloriann Loan. He is admitted @ this time for another UTI. Question arises whether procedure can be moved up.  At this point, I doubt there is a chance of moving up, and the pt definitely needs at least 2 weeks of abx prior to his intervention. I would recommend (and have ordered) catheter change while @ [facility]. Also would recommend that he start 0.25% acetic acid irrigations bid  once he goes home.

## 2019-06-14 ENCOUNTER — Inpatient Hospital Stay (HOSPITAL_COMMUNITY): Payer: Medicare (Managed Care)

## 2019-06-14 LAB — BLOOD GAS, ARTERIAL
Acid-Base Excess: 1.2 mmol/L (ref 0.0–2.0)
Bicarbonate: 25.2 mmol/L (ref 20.0–28.0)
FIO2: 28
O2 Saturation: 93.5 %
Patient temperature: 37
pCO2 arterial: 44.7 mmHg (ref 32.0–48.0)
pH, Arterial: 7.378 (ref 7.350–7.450)
pO2, Arterial: 72.6 mmHg — ABNORMAL LOW (ref 83.0–108.0)

## 2019-06-14 LAB — BASIC METABOLIC PANEL
Anion gap: 10 (ref 5–15)
BUN: 52 mg/dL — ABNORMAL HIGH (ref 8–23)
CO2: 22 mmol/L (ref 22–32)
Calcium: 8.9 mg/dL (ref 8.9–10.3)
Chloride: 110 mmol/L (ref 98–111)
Creatinine, Ser: 2.87 mg/dL — ABNORMAL HIGH (ref 0.61–1.24)
GFR calc Af Amer: 25 mL/min — ABNORMAL LOW (ref >60)
GFR calc non Af Amer: 21 mL/min — ABNORMAL LOW (ref >60)
Glucose, Bld: 137 mg/dL — ABNORMAL HIGH (ref 70–99)
Potassium: 4.8 mmol/L (ref 3.5–5.1)
Sodium: 142 mmol/L (ref 135–145)

## 2019-06-14 NOTE — Progress Notes (Signed)
Noted that pt seems to be sleeping very heavily today, and although rousable does not stay awake long. Notified Dr. Dyann Kief and will continue to monitor.

## 2019-06-14 NOTE — Progress Notes (Signed)
PROGRESS NOTE    Tony [name]  [id number] DOB: November 18, 1950 DOA: 06/12/2019 PCP: Velna Ochs, MD     Brief Narrative:  68 y.o. male with a past medical history significant for Alzheimer's dementia, BPH with lower urinary tract symptoms, chronic kidney disease stage III, hypertension, hyperlipidemia and seizure disorder; who presented to the hospital secondary to chills and not feeling well.  After reviewing her records he was recently seen for cystoscopy and voiding trial after experiencing urinary retention with the need of Foley catheter.  Patient denies nausea, vomiting, abdominal pain, cough, headaches, focal weakness, hematochezia, hematemesis, melena or any other complaints.  In the ED work-up demonstrated positive fever, tachycardia, no acute cardiopulmonary process on chest x-ray; urinalysis suggesting UTI with moderate leukocytes esterase and cloudy appearance; elevated lactic acid and acute on chronic renal failure.  Patient received fluid resuscitation, cultures taken and antibiotics started.  TR[facility] called admit patient for further evaluation and management.   Assessment & Plan: 1-sepsis secondary to UTI and concern for bacteremia -Change Foley catheter as recommended by urology service. -Continue current IV antibiotics (vancomycin and cefepime). -Follow urine and blood culture results -Continue IV fluids -No hydronephrosis appreciated. -Fever curve trending down; lactic acid has improved and going in the right direction.  2-acute on chronic renal failure -Stage IIIb chronic kidney disease at baseline -Renal ultrasound demonstrating no hydronephrosis or other acute obstructive uropathy.  Bilateral renal cysts appreciated. -Continue IV fluids -Minimize the use of nephrotoxic agents. -Continue treatment for acute UTI.  3-Hypertension -Stable overall -Continue current antihypertensive regimen and heart healthy diet.  4-Seizure disorder ([place]) -No seizure activity  appreciated -Continue Depakote.  5-HLD (hyperlipidemia) -Continue statins.  6-Dementia ([place]) -Continue the use of Aricept and supportive care -No behavioral disturbances appreciated at this moment.  7-Complicated UTI (urinary tract infection), associated to indwelling Foley catheter usage. -Catheter will be exchanged -Continue current antibiotics -Follow culture results -Per urology recommendations will initiate local anesthetic acid irrigations twice a day at time of discharge -Outpatient follow-up with urology service and TURP procedure to be done after 2 weeks of antibiotics completed.  8-BPH -continue flomax.  9-stage II pressure injury -frequent repositioning -preventive measures.    DVT prophylaxis: Heparin Code Status: Full code Family Communication: No family at bedside Disposition Plan: Continue IV antibiotics, continue IV fluids; will check renal ultrasound. Foley catheter has been exchanged as recommended by urology service. Continue to follow culture results.  Patient spike low-grade temperature overnight.  Consultants:   Urology service curbside (Dr. Diona Fanti) decision has been made to exchange Foley catheter, continue current antibiotic therapy, follow culture reports and at time of discharge most likely use of 0.25% acetic acid irrigations twice a day.  Procedures:   See below for x-ray reports.  Antimicrobials:  Anti-infectives (From admission, onward)   Start     Dose/Rate Route Frequency Ordered Stop   06/14/19 1400  vancomycin (VANCOCIN) IVPB 1000 mg/200 mL premix  Status:  Discontinued     1,000 mg 200 mL/hr over 60 Minutes Intravenous Every 24 hours 06/13/19 1102 06/13/19 1111   06/14/19 0912  ceFEPIme (MAXIPIME) 2 g in sodium chloride 0.9 % 100 mL IVPB     2 g 200 mL/hr over 30 Minutes Intravenous Every 24 hours 06/13/19 1111     06/13/19 1600  vancomycin (VANCOCIN) IVPB 1000 mg/200 mL premix     1,000 mg 200 mL/hr over 60 Minutes Intravenous  Every 24 hours 06/13/19 1111     06/13/19 1130  vancomycin (VANCOCIN) IVPB  1000 mg/200 mL premix  Status:  Discontinued     1,000 mg 200 mL/hr over 60 Minutes Intravenous Every 1 hr x 2 06/13/19 1102 06/13/19 1109   06/12/19 2200  ceFEPIme (MAXIPIME) 2 g in sodium chloride 0.9 % 100 mL IVPB  Status:  Discontinued     2 g 200 mL/hr over 30 Minutes Intravenous Every 12 hours 06/12/19 1530 06/13/19 1111   06/12/19 1300  vancomycin (VANCOCIN) IVPB 1000 mg/200 mL premix  Status:  Discontinued     1,000 mg 200 mL/hr over 60 Minutes Intravenous Every 1 hr x 2 06/12/19 1215 06/12/19 1431   06/12/19 1215  ceFEPIme (MAXIPIME) 2 g in sodium chloride 0.9 % 100 mL IVPB     2 g 200 mL/hr over 30 Minutes Intravenous  Once 06/12/19 1207 06/12/19 1336   06/12/19 1215  metroNIDAZOLE (FLAGYL) IVPB 500 mg  Status:  Discontinued     500 mg 100 mL/hr over 60 Minutes Intravenous  Once 06/12/19 1207 06/12/19 1442   06/12/19 1215  vancomycin (VANCOCIN) IVPB 1000 mg/200 mL premix  Status:  Discontinued     1,000 mg 200 mL/hr over 60 Minutes Intravenous  Once 06/12/19 1207 06/12/19 1215   06/12/19 1200  cefTRIAXone (ROCEPHIN) 1 g in sodium chloride 0.9 % 100 mL IVPB  Status:  Discontinued     1 g 200 mL/hr over 30 Minutes Intravenous  Once 06/12/19 1154 06/12/19 1207      Subjective: Feeling better, no nausea, no vomiting, no abdominal pain.  Low-grade temperature overnight.  Denies chest pain or shortness of breath.  Objective: Vitals:   06/13/19 2033 06/14/19 0450 06/14/19 0936 06/14/19 1324  BP: 124/68 (!) 101/54 (!) 118/54 (!) 112/58  Pulse: (!) 110 94  98  Resp: 18 20  18   Temp: (!) 100.9 F (38.3 C) 100 F (37.8 C)  98.1 F (36.7 C)  TempSrc:    Oral  SpO2: 94% 97%  94%  Weight:      Height:        Intake/Output Summary (Last 24 hours) at 06/14/2019 1422 Last data filed at 06/14/2019 1300 Gross per 24 hour  Intake 2508.42 ml  Output 2375 ml  Net 133.42 ml   Filed Weights   06/12/19  1039  Weight: 97 kg    Examination: General exam: oriented x 2; no nausea, no vomiting.  No chest pain.  More alert and awake today.  Denies abdominal pain.  Overnight with low-grade temperature. Respiratory system: Clear to auscultation. Respiratory effort normal. Cardiovascular system:RRR. No murmurs, rubs, gallops. Gastrointestinal system: Abdomen is nondistended, soft and nontender. No organomegaly or masses felt. Normal bowel sounds heard. Central nervous system: Alert and oriented. No focal neurological deficits. Extremities: No cyanosis or clubbing. Skin: Stage II sacral wound without superimposed infection; there is a stage II pressure injury posterior aspect of his scrotum.  No drainage appreciated.  Foley catheter in place. Psychiatry: Judgement and insight appear impaired secondary to underlying dementia.  Mood & affect appropriate.    Data Reviewed: I have personally reviewed following labs and imaging studies  CBC: Recent Labs  Lab 06/12/19 1304 06/13/19 0754  WBC 11.8* 18.9*  NEUTROABS 8.6*  --   HGB 13.8 15.1  HCT 43.2 47.8  MCV 98.2 99.8  PLT 153 0000000   Basic Metabolic Panel: Recent Labs  Lab 06/12/19 1218 06/13/19 0754 06/14/19 0408  NA 138 141 142  K 5.6* 5.5* 4.8  CL 100 104 110  CO2 26 24  22  GLUCOSE 163* 172* 137*  BUN 31* 43* 52*  CREATININE 2.06* 2.98* 2.87*  CALCIUM 9.6 9.3 8.9   GFR: Estimated Creatinine Clearance: 28.9 mL/min (A) (by C-G formula based on SCr of 2.87 mg/dL (H)).   Liver Function Tests: Recent Labs  Lab 06/12/19 1218  AST 30  ALT 17  ALKPHOS 54  BILITOT 1.2  PROT 8.4*  ALBUMIN 3.7   Coagulation Profile: Recent Labs  Lab 06/12/19 1304  INR 1.0   CBG: Recent Labs  Lab 06/10/19 0920  GLUCAP 154*   Urine analysis:    Component Value Date/Time   COLORURINE YELLOW 06/12/2019 1045   APPEARANCEUR CLOUDY (A) 06/12/2019 1045   LABSPEC 1.015 06/12/2019 1045   PHURINE 5.0 06/12/2019 1045   GLUCOSEU NEGATIVE  06/12/2019 1045   HGBUR LARGE (A) 06/12/2019 1045   BILIRUBINUR NEGATIVE 06/12/2019 1045   KETONESUR NEGATIVE 06/12/2019 1045   PROTEINUR 100 (A) 06/12/2019 1045   UROBILINOGEN 0.2 02/07/2010 1547   NITRITE POSITIVE (A) 06/12/2019 1045   LEUKOCYTESUR LARGE (A) 06/12/2019 1045    Recent Results (from the past 240 hour(s))  Novel Coronavirus, NAA (Hosp order, Send-out to Ref Lab; TAT 18-24 hrs     Status: None   Collection Time: 06/06/19  5:47 PM   Specimen: Nasopharyngeal Swab; Respiratory  Result Value Ref Range Status   SARS-CoV-2, NAA NOT DETECTED NOT DETECTED Final    Comment: (NOTE) This nucleic acid amplification test was developed and its performance characteristics determined by [employer]. Nucleic acid amplification tests include PCR and TMA. This test has not been FDA cleared or approved. This test has been authorized by FDA under an Emergency Use Authorization (EUA). This test is only authorized for the duration of time the declaration that circumstances exist justifying the authorization of the emergency use of in vitro diagnostic tests for detection of SARS-CoV-2 virus and/or diagnosis of COVID-19 infection under section 564(b)(1) of the Act, 21 U.S.C. [id number]) (1), unless the authorization is terminated or revoked sooner. When diagnostic testing is negative, the possibility of a false negative result should be considered in the context of a patient's recent exposures and the presence of clinical signs and symptoms consistent with COVID-19. An individual without symptoms of COVID- 19 and who is not shedding SARS-CoV-2 vi rus would expect to have a negative (not detected) result in this assay. Performed At: [facility] [address] [place], [place] [id number] Rush Farmer MD [id number]    [place]  Final    Comment: Performed at [facility], [place] [address] [place], [place] [address]  SARS CORONAVIRUS 2  (TAT 6-24 HRS) Nasopharyngeal Nasopharyngeal Swab     Status: None   Collection Time: 06/12/19 11:57 AM   Specimen: Nasopharyngeal Swab  Result Value Ref Range Status   SARS Coronavirus 2 NEGATIVE NEGATIVE Final    Comment: (NOTE) SARS-CoV-2 target nucleic acids are NOT DETECTED. The SARS-CoV-2 RNA is generally detectable in upper and lower respiratory specimens during the acute phase of infection. Negative results do not preclude SARS-CoV-2 infection, do not rule out co-infections with other pathogens, and should not be used as the sole basis for treatment or other patient management decisions. Negative results must be combined with clinical observations, patient history, and epidemiological information. The expected result is Negative. Fact Sheet for Patients: SugarRoll.be Fact Sheet for Healthcare Providers: https://www.woods-mathews.com/ This test is not yet approved or cleared by the [place] FDA and  has been authorized for detection and/or  diagnosis of SARS-CoV-2 by FDA under an Emergency Use Authorization (EUA). This EUA will remain  in effect (meaning this test can be used) for the duration of the COVID-19 declaration under Section 56 4(b)(1) of the Act, 21 U.S.C. section 360bbb-3(b)(1), unless the authorization is terminated or revoked sooner. Performed at [facility], [place] [address], [place], [place] [address]   Culture, blood (Routine x 2)     Status: None (Preliminary result)   Collection Time: 06/12/19 12:02 PM   Specimen: BLOOD RIGHT HAND  Result Value Ref Range Status   Specimen Description BLOOD RIGHT HAND  Final   Special Requests   Final    BOTTLES DRAWN AEROBIC AND ANAEROBIC Blood Culture results may not be optimal due to an inadequate volume of blood received in culture bottles   Culture   Final    NO GROWTH 2 DAYS Performed at [facility], [address] [place], [place] [address]    Report Status  PENDING  Incomplete  Culture, blood (Routine x 2)     Status: Abnormal (Preliminary result)   Collection Time: 06/12/19 12:18 PM   Specimen: BLOOD RIGHT ARM  Result Value Ref Range Status   Specimen Description   Final    BLOOD RIGHT ARM Performed at [facility], [address], [place], [place] [address]    Special Requests   Final    BOTTLES DRAWN AEROBIC AND ANAEROBIC Blood Culture results may not be optimal due to an inadequate volume of blood received in culture bottles Performed at [facility], [address], [place], [place] [address]    Culture  Setup Time   Final    [place] Gram Stain Report Called to,Read Back By and Verified With: JACKSON @ 1003 ON [id number] BY HENDERSON L. Performed at [facility], [address], [place], [place] [address]    Culture (A)  Final    STAPHYLOCOCCUS SPECIES (COAGULASE NEGATIVE) THE SIGNIFICANCE OF ISOLATING THIS ORGANISM FROM A SINGLE SET OF BLOOD CULTURES WHEN MULTIPLE SETS ARE DRAWN IS UNCERTAIN. PLEASE NOTIFY THE MICROBIOLOGY DEPARTMENT WITHIN ONE WEEK IF SPECIATION AND SENSITIVITIES ARE REQUIRED. Performed at [facility], [place] [address] [place], [place] [address]    Report Status PENDING  Incomplete     Radiology Studies: No results found.  Scheduled Meds:  amLODipine  5 mg Oral Daily   Chlorhexidine Gluconate Cloth  6 each Topical Daily   divalproex  1,500 mg Oral QHS   donepezil  5 mg Oral QHS   heparin injection (subcutaneous)  5,000 Units Subcutaneous Q8H   rosuvastatin  5 mg Oral QHS   tamsulosin  0.8 mg Oral QHS   Continuous Infusions:  sodium chloride 75 mL/hr at 06/14/19 0115   ceFEPime (MAXIPIME) IV 2 g (06/14/19 0930)   vancomycin 1,000 mg (06/13/19 1704)     LOS: 1 day    Time spent: 30 minutes.   Barton Dubois, MD [facility] Pager [phone]  06/14/2019, 2:22 PM

## 2019-06-15 LAB — BASIC METABOLIC PANEL
Anion gap: 12 (ref 5–15)
BUN: 44 mg/dL — ABNORMAL HIGH (ref 8–23)
CO2: 26 mmol/L (ref 22–32)
Calcium: 9.2 mg/dL (ref 8.9–10.3)
Chloride: 110 mmol/L (ref 98–111)
Creatinine, Ser: 1.9 mg/dL — ABNORMAL HIGH (ref 0.61–1.24)
GFR calc Af Amer: 41 mL/min — ABNORMAL LOW (ref >60)
GFR calc non Af Amer: 35 mL/min — ABNORMAL LOW (ref >60)
Glucose, Bld: 109 mg/dL — ABNORMAL HIGH (ref 70–99)
Potassium: 4.9 mmol/L (ref 3.5–5.1)
Sodium: 148 mmol/L — ABNORMAL HIGH (ref 135–145)

## 2019-06-15 LAB — CULTURE, BLOOD (ROUTINE X 2)

## 2019-06-15 NOTE — Progress Notes (Signed)
PROGRESS NOTE    Tony [name]  [id number] DOB: 1951/03/03 DOA: 06/12/2019 PCP: Velna Ochs, MD     Brief Narrative:  68 y.o. male with a past medical history significant for Alzheimer's dementia, BPH with lower urinary tract symptoms, chronic kidney disease stage III, hypertension, hyperlipidemia and seizure disorder; who presented to the hospital secondary to chills and not feeling well.  After reviewing her records he was recently seen for cystoscopy and voiding trial after experiencing urinary retention with the need of Foley catheter.  Patient denies nausea, vomiting, abdominal pain, cough, headaches, focal weakness, hematochezia, hematemesis, melena or any other complaints.  In the ED work-up demonstrated positive fever, tachycardia, no acute cardiopulmonary process on chest x-ray; urinalysis suggesting UTI with moderate leukocytes esterase and cloudy appearance; elevated lactic acid and acute on chronic renal failure.  Patient received fluid resuscitation, cultures taken and antibiotics started.  [facility] called admit patient for further evaluation and management.   Assessment & Plan: 1-sepsis secondary to UTI  -Change Foley catheter as recommended by urology service. -Continue current IV antibiotics (vancomycin and cefepime). -Follow urine and blood culture results -Continue IV fluids and supportive care. -No hydronephrosis appreciated. -Fever curve trending down and improving overall.  2-acute on chronic renal failure -Stage IIIb chronic kidney disease at baseline -Renal ultrasound demonstrating no hydronephrosis or other acute obstructive uropathy.  Bilateral renal cysts appreciated. -Continue IV fluids. -Minimize the use of nephrotoxic agents. -Continue treatment for acute UTI. -Creatinine improving and trending down, currently 1.9.  3-Hypertension -Stable overall -Continue current antihypertensive regimen and heart healthy diet.  4-Seizure disorder ([place]) -No  seizure activity appreciated -Continue Depakote.  5-HLD (hyperlipidemia) -Continue statins.  6-Dementia ([place]) -Continue the use of Aricept and supportive care -No behavioral disturbances appreciated at this moment.  7-Complicated UTI (urinary tract infection), associated to indwelling Foley catheter usage. -Catheter will be exchanged -Continue current antibiotics -Follow culture results -Per urology recommendations will initiate local anesthetic acid irrigations twice a day at time of discharge -Outpatient follow-up with urology service and TURP procedure to be done after 2 weeks of antibiotics completed.  8-BPH -continue flomax.  9-stage II pressure injury -frequent repositioning -preventive measures.   10-coagulase-negative bacteremia -Most likely contaminant in nature -Patient appropriately improving and responding to current antibiotics -Will discontinue vancomycin.   DVT prophylaxis: Heparin Code Status: Full code Family Communication: No family at bedside Disposition Plan: Continue IV antibiotics, continue IV fluids; will check renal ultrasound. Foley catheter has been exchanged as recommended by urology service. Continue to follow culture results.  Patient spiked low-grade temperature overnight; but fever curve overall improving/resolving..  Consultants:   Urology service curbside (Dr. Diona Fanti) decision has been made to exchange Foley catheter, continue current antibiotic therapy, follow culture reports and at time of discharge most likely use of 0.25% acetic acid irrigations twice a day.  Procedures:   See below for x-ray reports.  Antimicrobials:  Anti-infectives (From admission, onward)   Start     Dose/Rate Route Frequency Ordered Stop   06/14/19 1400  vancomycin (VANCOCIN) IVPB 1000 mg/200 mL premix  Status:  Discontinued     1,000 mg 200 mL/hr over 60 Minutes Intravenous Every 24 hours 06/13/19 1102 06/13/19 1111   06/14/19 0912  ceFEPIme (MAXIPIME) 2 g  in sodium chloride 0.9 % 100 mL IVPB     2 g 200 mL/hr over 30 Minutes Intravenous Every 24 hours 06/13/19 1111     06/13/19 1600  vancomycin (VANCOCIN) IVPB 1000 mg/200 mL premix  Status:  Discontinued     1,000 mg 200 mL/hr over 60 Minutes Intravenous Every 24 hours 06/13/19 1111 06/15/19 1005   06/13/19 1130  vancomycin (VANCOCIN) IVPB 1000 mg/200 mL premix  Status:  Discontinued     1,000 mg 200 mL/hr over 60 Minutes Intravenous Every 1 hr x 2 06/13/19 1102 06/13/19 1109   06/12/19 2200  ceFEPIme (MAXIPIME) 2 g in sodium chloride 0.9 % 100 mL IVPB  Status:  Discontinued     2 g 200 mL/hr over 30 Minutes Intravenous Every 12 hours 06/12/19 1530 06/13/19 1111   06/12/19 1300  vancomycin (VANCOCIN) IVPB 1000 mg/200 mL premix  Status:  Discontinued     1,000 mg 200 mL/hr over 60 Minutes Intravenous Every 1 hr x 2 06/12/19 1215 06/12/19 1431   06/12/19 1215  ceFEPIme (MAXIPIME) 2 g in sodium chloride 0.9 % 100 mL IVPB     2 g 200 mL/hr over 30 Minutes Intravenous  Once 06/12/19 1207 06/12/19 1336   06/12/19 1215  metroNIDAZOLE (FLAGYL) IVPB 500 mg  Status:  Discontinued     500 mg 100 mL/hr over 60 Minutes Intravenous  Once 06/12/19 1207 06/12/19 1442   06/12/19 1215  vancomycin (VANCOCIN) IVPB 1000 mg/200 mL premix  Status:  Discontinued     1,000 mg 200 mL/hr over 60 Minutes Intravenous  Once 06/12/19 1207 06/12/19 1215   06/12/19 1200  cefTRIAXone (ROCEPHIN) 1 g in sodium chloride 0.9 % 100 mL IVPB  Status:  Discontinued     1 g 200 mL/hr over 30 Minutes Intravenous  Once 06/12/19 1154 06/12/19 1207      Subjective: Currently afebrile, no nausea, no vomiting.  Denies abdominal pain.  Low-grade temp around 9 PM on 06/14/2019.  Objective: Vitals:   06/14/19 2131 06/15/19 0450 06/15/19 0913 06/15/19 1313  BP: (!) 124/59 131/79 (!) 140/55 (!) 131/58  Pulse: 88 84 80 80  Resp: 20 20 18 18   Temp: (!) 100.7 F (38.2 C) 99.9 F (37.7 C)  99.5 F (37.5 C)  TempSrc: Oral Oral  Oral   SpO2: 96% 96% 96% 98%  Weight:      Height:        Intake/Output Summary (Last 24 hours) at 06/15/2019 1606 Last data filed at 06/15/2019 1300 Gross per 24 hour  Intake 290 ml  Output 2700 ml  Net -2410 ml   Filed Weights   06/12/19 1039  Weight: 97 kg    Examination: General exam: Alert, awake, oriented x 2; afebrile, no nausea, no vomiting.  No chest pain.  More alert and interactive today.  Denies abdominal pain. Respiratory system: Clear to auscultation. Respiratory effort normal. Cardiovascular system:RRR. No murmurs, rubs, gallops. Gastrointestinal system: Abdomen is nondistended, soft and nontender. No organomegaly or masses felt. Normal bowel sounds heard. Central nervous system: Alert and oriented. No focal neurological deficits. Extremities: No cyanosis or clubbing. Skin: No rashes, no petechiae.  Stage II sacral wound without superimposed infection and also stage II pressure injury in the posterior aspect of his scrotum. Psychiatry: Mood & affect appropriate.   Data Reviewed: I have personally reviewed following labs and imaging studies  CBC: Recent Labs  Lab 06/12/19 1304 06/13/19 0754  WBC 11.8* 18.9*  NEUTROABS 8.6*  --   HGB 13.8 15.1  HCT 43.2 47.8  MCV 98.2 99.8  PLT 153 0000000   Basic Metabolic Panel: Recent Labs  Lab 06/12/19 1218 06/13/19 0754 06/14/19 0408 06/15/19 1200  NA 138 141 142 148*  K 5.6*  5.5* 4.8 4.9  CL 100 104 110 110  CO2 26 24 22 26   GLUCOSE 163* 172* 137* 109*  BUN 31* 43* 52* 44*  CREATININE 2.06* 2.98* 2.87* 1.90*  CALCIUM 9.6 9.3 8.9 9.2   GFR: Estimated Creatinine Clearance: 43.6 mL/min (A) (by C-G formula based on SCr of 1.9 mg/dL (H)).   Liver Function Tests: Recent Labs  Lab 06/12/19 1218  AST 30  ALT 17  ALKPHOS 54  BILITOT 1.2  PROT 8.4*  ALBUMIN 3.7   Coagulation Profile: Recent Labs  Lab 06/12/19 1304  INR 1.0   CBG: Recent Labs  Lab 06/10/19 0920  GLUCAP 154*   Urine analysis:     Component Value Date/Time   COLORURINE YELLOW 06/12/2019 1045   APPEARANCEUR CLOUDY (A) 06/12/2019 1045   LABSPEC 1.015 06/12/2019 1045   PHURINE 5.0 06/12/2019 1045   GLUCOSEU NEGATIVE 06/12/2019 1045   HGBUR LARGE (A) 06/12/2019 1045   BILIRUBINUR NEGATIVE 06/12/2019 1045   KETONESUR NEGATIVE 06/12/2019 1045   PROTEINUR 100 (A) 06/12/2019 1045   UROBILINOGEN 0.2 02/07/2010 1547   NITRITE POSITIVE (A) 06/12/2019 1045   LEUKOCYTESUR LARGE (A) 06/12/2019 1045    Recent Results (from the past 240 hour(s))  Novel Coronavirus, NAA (Hosp order, Send-out to Ref Lab; TAT 18-24 hrs     Status: None   Collection Time: 06/06/19  5:47 PM   Specimen: Nasopharyngeal Swab; Respiratory  Result Value Ref Range Status   SARS-CoV-2, NAA NOT DETECTED NOT DETECTED Final    Comment: (NOTE) This nucleic acid amplification test was developed and its performance characteristics determined by [employer]. Nucleic acid amplification tests include PCR and TMA. This test has not been FDA cleared or approved. This test has been authorized by FDA under an Emergency Use Authorization (EUA). This test is only authorized for the duration of time the declaration that circumstances exist justifying the authorization of the emergency use of in vitro diagnostic tests for detection of SARS-CoV-2 virus and/or diagnosis of COVID-19 infection under section 564(b)(1) of the Act, 21 U.S.C. [id number]) (1), unless the authorization is terminated or revoked sooner. When diagnostic testing is negative, the possibility of a false negative result should be considered in the context of a patient's recent exposures and the presence of clinical signs and symptoms consistent with COVID-19. An individual without symptoms of COVID- 19 and who is not shedding SARS-CoV-2 vi rus would expect to have a negative (not detected) result in this assay. Performed At: [facility] [address] [place], [place] [id number]  Rush Farmer MD [id number]    [place]  Final    Comment: Performed at [facility], [place] [address], [place], [place] [address]  SARS CORONAVIRUS 2 (TAT 6-24 HRS) Nasopharyngeal Nasopharyngeal Swab     Status: None   Collection Time: 06/12/19 11:57 AM   Specimen: Nasopharyngeal Swab  Result Value Ref Range Status   SARS Coronavirus 2 NEGATIVE NEGATIVE Final    Comment: (NOTE) SARS-CoV-2 target nucleic acids are NOT DETECTED. The SARS-CoV-2 RNA is generally detectable in upper and lower respiratory specimens during the acute phase of infection. Negative results do not preclude SARS-CoV-2 infection, do not rule out co-infections with other pathogens, and should not be used as the sole basis for treatment or other patient management decisions. Negative results must be combined with clinical observations, patient history, and epidemiological information. The expected result is Negative. Fact Sheet for Patients: SugarRoll.be Fact Sheet for Healthcare Providers: https://www.woods-mathews.com/ This test is  not yet approved or cleared by the [place] and  has been authorized for detection and/or diagnosis of SARS-CoV-2 by FDA under an Emergency Use Authorization (EUA). This EUA will remain  in effect (meaning this test can be used) for the duration of the COVID-19 declaration under Section 56 4(b)(1) of the Act, 21 U.S.C. section 360bbb-3(b)(1), unless the authorization is terminated or revoked sooner. Performed at [facility], [place] [address] [place], [place] [address]   Culture, blood (Routine x 2)     Status: None (Preliminary result)   Collection Time: 06/12/19 12:02 PM   Specimen: BLOOD RIGHT HAND  Result Value Ref Range Status   Specimen Description BLOOD RIGHT HAND  Final   Special Requests   Final    BOTTLES DRAWN AEROBIC AND ANAEROBIC Blood Culture results may not be optimal due to  an inadequate volume of blood received in culture bottles   Culture   Final    NO GROWTH 3 DAYS Performed at [facility], [address], [place], [place] [address]    Report Status PENDING  Incomplete  Urine culture     Status: Abnormal (Preliminary result)   Collection Time: 06/12/19 12:07 PM   Specimen: In/Out Cath Urine  Result Value Ref Range Status   Specimen Description   Final    IN/OUT CATH URINE Performed at [facility], [address] [place], [place] [address]    Special Requests   Final    NONE Performed at [facility], [address] [place], [place] [address]    Culture (A)  Final    >=100,000 COLONIES/mL CITROBACTER BRAAKII ENTEROCOCCUS FAECALIS SUSCEPTIBILITIES TO FOLLOW Performed at [facility], [place] [address] [place], [place] [address]    Report Status PENDING  Incomplete  Culture, blood (Routine x 2)     Status: Abnormal   Collection Time: 06/12/19 12:18 PM   Specimen: BLOOD RIGHT ARM  Result Value Ref Range Status   Specimen Description   Final    BLOOD RIGHT ARM Performed at [facility], [address] [place], [place] [address]    Special Requests   Final    BOTTLES DRAWN AEROBIC AND ANAEROBIC Blood Culture results may not be optimal due to an inadequate volume of blood received in culture bottles Performed at [facility], [address] [place], [place] [address]    Culture  Setup Time   Final    GRAM POSITIVE COCCI ANAEROBIC BOTTLE ONLY Gram Stain Report Called to,Read Back By and Verified With: JACKSON @ 1003 ON [id number] BY HENDERSON L. Performed at [facility], [address], [place], [place] [address]    Culture (A)  Final    STAPHYLOCOCCUS SPECIES (COAGULASE NEGATIVE) THE SIGNIFICANCE OF ISOLATING THIS ORGANISM FROM A SINGLE SET OF BLOOD CULTURES WHEN MULTIPLE SETS ARE DRAWN IS UNCERTAIN. PLEASE NOTIFY THE MICROBIOLOGY DEPARTMENT WITHIN ONE WEEK IF SPECIATION AND SENSITIVITIES ARE REQUIRED. Performed at [facility],  [place] [address] [place], [place] [address]    Report Status 06/15/2019 FINAL  Final     Radiology Studies: US Renal  Result Date: 06/14/2019 CLINICAL DATA:  Initial evaluation for acute renal insufficiency. EXAM: RENAL / URINARY TRACT ULTRASOUND COMPLETE COMPARISON:  None. FINDINGS: Right Kidney: Renal measurements: 12.3 x 4.5 x 4.7 cm = volume: 140 mL. Echogenicity within normal limits. No nephrolithiasis or hydronephrosis. 3.3 x 3.2 x 2.1 cm simple cyst present at the interpolar region. 1.8 x 1.4 x 1.5 cm simple  cyst present at the lower pole. Left Kidney: Renal measurements: 12.6 x 6.8 x 6.1 cm = volume: 273 mL. Echogenicity within normal limits. No nephrolithiasis or hydronephrosis. 2.7 x 2.2 x 2.3 cm simple cyst present at the lower pole. Bladder: Decompressed with a Foley catheter in place. Other: None. IMPRESSION: 1. No hydronephrosis or other acute finding. 2. Simple bilateral renal cysts as above. Electronically Signed   By: Jeannine Boga M.D.   On: 06/14/2019 14:22    Scheduled Meds: . amLODipine  5 mg Oral Daily  . Chlorhexidine Gluconate Cloth  6 each Topical Daily  . divalproex  1,500 mg Oral QHS  . donepezil  5 mg Oral QHS  . heparin injection (subcutaneous)  5,000 Units Subcutaneous Q8H  . rosuvastatin  5 mg Oral QHS  . tamsulosin  0.8 mg Oral QHS   Continuous Infusions: . sodium chloride 75 mL/hr at 06/15/19 1002  . ceFEPime (MAXIPIME) IV 2 g (06/15/19 1007)     LOS: 2 days    Time spent: 30 minutes.   Barton Dubois, MD [facility] Pager [phone]  06/15/2019, 4:06 PM

## 2019-06-16 LAB — BASIC METABOLIC PANEL
Anion gap: 7 (ref 5–15)
BUN: 34 mg/dL — ABNORMAL HIGH (ref 8–23)
CO2: 27 mmol/L (ref 22–32)
Calcium: 8.9 mg/dL (ref 8.9–10.3)
Chloride: 117 mmol/L — ABNORMAL HIGH (ref 98–111)
Creatinine, Ser: 1.68 mg/dL — ABNORMAL HIGH (ref 0.61–1.24)
GFR calc Af Amer: 47 mL/min — ABNORMAL LOW (ref >60)
GFR calc non Af Amer: 41 mL/min — ABNORMAL LOW (ref >60)
Glucose, Bld: 107 mg/dL — ABNORMAL HIGH (ref 70–99)
Potassium: 4.2 mmol/L (ref 3.5–5.1)
Sodium: 151 mmol/L — ABNORMAL HIGH (ref 135–145)

## 2019-06-16 LAB — URINE CULTURE: Culture: >=100000 — AB

## 2019-06-16 MED ORDER — AMOXICILLIN-POT CLAVULANATE 875-125 MG PO TABS
1.0000 | ORAL_TABLET | Freq: Two times a day (BID) | ORAL | Status: DC
  Administered 2019-06-16 – 2019-06-17 (×2): 1 via ORAL
  Filled 2019-06-16 (×2): qty 1

## 2019-06-16 MED ORDER — CEFDINIR 300 MG PO CAPS
300.0000 mg | ORAL_CAPSULE | Freq: Two times a day (BID) | ORAL | Status: DC
  Administered 2019-06-16 – 2019-06-17 (×3): 300 mg via ORAL
  Filled 2019-06-16 (×3): qty 1

## 2019-06-16 MED ORDER — AMOXICILLIN-POT CLAVULANATE 500-125 MG PO TABS
1.0000 | ORAL_TABLET | Freq: Two times a day (BID) | ORAL | Status: DC
  Administered 2019-06-16: 500 mg via ORAL
  Filled 2019-06-16: qty 1

## 2019-06-16 MED ORDER — DEXTROSE-NACL 5-0.45 % IV SOLN
INTRAVENOUS | Status: DC
  Administered 2019-06-16: 13:00:00 via INTRAVENOUS

## 2019-06-16 NOTE — Progress Notes (Signed)
PROGRESS NOTE    Tony [name]  [id number] DOB: Jan 26, 1951 DOA: 06/12/2019 PCP: Velna Ochs, MD     Brief Narrative:  68 y.o. male with a past medical history significant for Alzheimer's dementia, BPH with lower urinary tract symptoms, chronic kidney disease stage III, hypertension, hyperlipidemia and seizure disorder; who presented to the hospital secondary to chills and not feeling well.  After reviewing her records he was recently seen for cystoscopy and voiding trial after experiencing urinary retention with the need of Foley catheter.  Patient denies nausea, vomiting, abdominal pain, cough, headaches, focal weakness, hematochezia, hematemesis, melena or any other complaints.  In the ED work-up demonstrated positive fever, tachycardia, no acute cardiopulmonary process on chest x-ray; urinalysis suggesting UTI with moderate leukocytes esterase and cloudy appearance; elevated lactic acid and acute on chronic renal failure.  Patient received fluid resuscitation, cultures taken and antibiotics started.  [facility] called admit patient for further evaluation and management.   Assessment & Plan: 1-sepsis secondary to UTI  -Change Foley catheter as recommended by urology service. -Continue current IV antibiotics (vancomycin and cefepime). -Follow urine and blood culture results -Continue IV fluids and supportive care.  -No hydronephrosis appreciated. -Fever curve trending down and improving overall. -Repeat CBC in a.m.  2-acute on chronic renal failure -Stage IIIb chronic kidney disease at baseline -Renal ultrasound demonstrating no hydronephrosis or other acute obstructive uropathy.  Bilateral renal cysts appreciated. -Continue IV fluids. -Minimize the use of nephrotoxic agents. -Continue treatment for acute UTI. -Creatinine improving and trending down, currently 1.68.  3-Hypertension -Stable overall -Continue current antihypertensive regimen and heart healthy diet.  4-Seizure  disorder ([place]) -No seizure activity appreciated -Continue Depakote.  5-HLD (hyperlipidemia) -Continue statins.  6-Dementia ([place]) -Continue the use of Aricept and supportive care -No behavioral disturbances appreciated at this moment.  7-Complicated UTI (urinary tract infection), associated to indwelling Foley catheter usage. -Catheter will be exchanged -Continue current antibiotics -Following cultures results and speciation demonstrating Enterobacter and Citrobacter microorganisms will transition antibiotics to the use of amoxicillin and Omnicef. -Per urology recommendations will initiate local anesthetic acid irrigations twice a day at time of discharge -Outpatient follow-up with urology service and TURP procedure to be done after 2 weeks of antibiotics completed.  (Currently day 5 out of 14).  8-BPH -continue flomax.  9-stage II pressure injury -frequent repositioning -preventive measures.   10-coagulase-negative bacteremia -Most likely contaminant in nature -Patient appropriately improving and responding to current antibiotics -Vancomycin has been discontinued.  11-hypernatremia -IV fluids to be changed to D5 half-normal saline -Follow electrolytes trend. -Advised to maintain adequate hydration.   DVT prophylaxis: Heparin Code Status: Full code Family Communication: No family at bedside Disposition Plan: Continue antibiotics, but based on sensitivity and speciation will transition to oral regimen and assess tolerance.  Continue IV fluids, given hypernatremia we will change fluids to D5 half-normal saline. Foley catheter has been exchanged as recommended by urology service. Continue to follow culture results.  Patient spiked low-grade temperature overnight; but fever curve overall continue improving/resolving..  Consultants:   Urology service curbside (Dr. Diona Fanti) decision has been made to exchange Foley catheter, continue current antibiotic therapy, follow culture  reports and at time of discharge most likely use of 0.25% acetic acid irrigations twice a day.  Procedures:   See below for x-ray reports.  Antimicrobials:  Anti-infectives (From admission, onward)   Start     Dose/Rate Route Frequency Ordered Stop   06/16/19 1245  cefdinir (OMNICEF) capsule 300 mg     300 mg  Oral Every 12 hours 06/16/19 1231     06/16/19 1245  amoxicillin-clavulanate (AUGMENTIN) 500-125 MG per tablet 500 mg     1 tablet Oral Every 12 hours 06/16/19 1231     06/14/19 1400  vancomycin (VANCOCIN) IVPB 1000 mg/200 mL premix  Status:  Discontinued     1,000 mg 200 mL/hr over 60 Minutes Intravenous Every 24 hours 06/13/19 1102 06/13/19 1111   06/14/19 0912  ceFEPIme (MAXIPIME) 2 g in sodium chloride 0.9 % 100 mL IVPB  Status:  Discontinued     2 g 200 mL/hr over 30 Minutes Intravenous Every 24 hours 06/13/19 1111 06/16/19 1231   06/13/19 1600  vancomycin (VANCOCIN) IVPB 1000 mg/200 mL premix  Status:  Discontinued     1,000 mg 200 mL/hr over 60 Minutes Intravenous Every 24 hours 06/13/19 1111 06/15/19 1005   06/13/19 1130  vancomycin (VANCOCIN) IVPB 1000 mg/200 mL premix  Status:  Discontinued     1,000 mg 200 mL/hr over 60 Minutes Intravenous Every 1 hr x 2 06/13/19 1102 06/13/19 1109   06/12/19 2200  ceFEPIme (MAXIPIME) 2 g in sodium chloride 0.9 % 100 mL IVPB  Status:  Discontinued     2 g 200 mL/hr over 30 Minutes Intravenous Every 12 hours 06/12/19 1530 06/13/19 1111   06/12/19 1300  vancomycin (VANCOCIN) IVPB 1000 mg/200 mL premix  Status:  Discontinued     1,000 mg 200 mL/hr over 60 Minutes Intravenous Every 1 hr x 2 06/12/19 1215 06/12/19 1431   06/12/19 1215  ceFEPIme (MAXIPIME) 2 g in sodium chloride 0.9 % 100 mL IVPB     2 g 200 mL/hr over 30 Minutes Intravenous  Once 06/12/19 1207 06/12/19 1336   06/12/19 1215  metroNIDAZOLE (FLAGYL) IVPB 500 mg  Status:  Discontinued     500 mg 100 mL/hr over 60 Minutes Intravenous  Once 06/12/19 1207 06/12/19 1442    06/12/19 1215  vancomycin (VANCOCIN) IVPB 1000 mg/200 mL premix  Status:  Discontinued     1,000 mg 200 mL/hr over 60 Minutes Intravenous  Once 06/12/19 1207 06/12/19 1215   06/12/19 1200  cefTRIAXone (ROCEPHIN) 1 g in sodium chloride 0.9 % 100 mL IVPB  Status:  Discontinued     1 g 200 mL/hr over 30 Minutes Intravenous  Once 06/12/19 1154 06/12/19 1207      Subjective: No nausea, no vomiting, no chest pain, no shortness of breath.  Still spiking fever; T-max 100.9 overnight.  Objective: Vitals:   06/15/19 1313 06/15/19 2041 06/15/19 2116 06/16/19 0507  BP: (!) 131/58  (!) 131/57 139/60  Pulse: 80  85 89  Resp: 18  20 20   Temp: 99.5 F (37.5 C)  (!) 100.9 F (38.3 C) 99.2 F (37.3 C)  TempSrc: Oral  Oral Oral  SpO2: 98% 98% 97% 99%  Weight:      Height:        Intake/Output Summary (Last 24 hours) at 06/16/2019 1233 Last data filed at 06/16/2019 0900 Gross per 24 hour  Intake 120 ml  Output 2050 ml  Net -1930 ml   Filed Weights   06/12/19 1039  Weight: 97 kg    Examination: General exam: Alert, awake, oriented x 2; low-grade temperature overnight; no nausea, no vomiting.  Denies chest pain. Respiratory system: Clear to auscultation. Respiratory effort normal. Cardiovascular system:RRR. No murmurs, rubs, gallops. Gastrointestinal system: Abdomen is nondistended, soft and nontender. No organomegaly or masses felt. Normal bowel sounds heard. Central nervous system: Alert and  oriented. No focal neurological deficits. Extremities: No cyanosis or clubbing. Skin: No rashes, no petechiae.  Stage II pressure injury appreciated in the posterior aspect of his scrotum and sacral area.  No signs of superimposed infection. Psychiatry: Mood & affect appropriate.    Data Reviewed: I have personally reviewed following labs and imaging studies  CBC: Recent Labs  Lab 06/12/19 1304 06/13/19 0754  WBC 11.8* 18.9*  NEUTROABS 8.6*  --   HGB 13.8 15.1  HCT 43.2 47.8  MCV 98.2  99.8  PLT 153 0000000   Basic Metabolic Panel: Recent Labs  Lab 06/12/19 1218 06/13/19 0754 06/14/19 0408 06/15/19 1200 06/16/19 0631  NA 138 141 142 148* 151*  K 5.6* 5.5* 4.8 4.9 4.2  CL 100 104 110 110 117*  CO2 26 24 22 26 27   GLUCOSE 163* 172* 137* 109* 107*  BUN 31* 43* 52* 44* 34*  CREATININE 2.06* 2.98* 2.87* 1.90* 1.68*  CALCIUM 9.6 9.3 8.9 9.2 8.9   GFR: Estimated Creatinine Clearance: 49.3 mL/min (A) (by C-G formula based on SCr of 1.68 mg/dL (H)).   Liver Function Tests: Recent Labs  Lab 06/12/19 1218  AST 30  ALT 17  ALKPHOS 54  BILITOT 1.2  PROT 8.4*  ALBUMIN 3.7   Coagulation Profile: Recent Labs  Lab 06/12/19 1304  INR 1.0   CBG: Recent Labs  Lab 06/10/19 0920  GLUCAP 154*   Urine analysis:    Component Value Date/Time   COLORURINE YELLOW 06/12/2019 1045   APPEARANCEUR CLOUDY (A) 06/12/2019 1045   LABSPEC 1.015 06/12/2019 1045   PHURINE 5.0 06/12/2019 1045   GLUCOSEU NEGATIVE 06/12/2019 1045   HGBUR LARGE (A) 06/12/2019 1045   BILIRUBINUR NEGATIVE 06/12/2019 1045   KETONESUR NEGATIVE 06/12/2019 1045   PROTEINUR 100 (A) 06/12/2019 1045   UROBILINOGEN 0.2 02/07/2010 1547   NITRITE POSITIVE (A) 06/12/2019 1045   LEUKOCYTESUR LARGE (A) 06/12/2019 1045    Recent Results (from the past 240 hour(s))  Novel Coronavirus, NAA (Hosp order, Send-out to Ref Lab; TAT 18-24 hrs     Status: None   Collection Time: 06/06/19  5:47 PM   Specimen: Nasopharyngeal Swab; Respiratory  Result Value Ref Range Status   SARS-CoV-2, NAA NOT DETECTED NOT DETECTED Final    Comment: (NOTE) This nucleic acid amplification test was developed and its performance characteristics determined by [employer]. Nucleic acid amplification tests include PCR and TMA. This test has not been FDA cleared or approved. This test has been authorized by FDA under an Emergency Use Authorization (EUA). This test is only authorized for the duration of time the declaration  that circumstances exist justifying the authorization of the emergency use of in vitro diagnostic tests for detection of SARS-CoV-2 virus and/or diagnosis of COVID-19 infection under section 564(b)(1) of the Act, 21 U.S.C. [id number]) (1), unless the authorization is terminated or revoked sooner. When diagnostic testing is negative, the possibility of a false negative result should be considered in the context of a patient's recent exposures and the presence of clinical signs and symptoms consistent with COVID-19. An individual without symptoms of COVID- 19 and who is not shedding SARS-CoV-2 vi rus would expect to have a negative (not detected) result in this assay. Performed At: [facility] [address] [place], [place] [id number] Rush Farmer MD [id number]    [place]  Final    Comment: Performed at [facility], [place] [address] [place], [place] [address]  SARS CORONAVIRUS 2 (TAT 6-24 HRS)  Nasopharyngeal Nasopharyngeal Swab     Status: None   Collection Time: 06/12/19 11:57 AM   Specimen: Nasopharyngeal Swab  Result Value Ref Range Status   SARS Coronavirus 2 NEGATIVE NEGATIVE Final    Comment: (NOTE) SARS-CoV-2 target nucleic acids are NOT DETECTED. The SARS-CoV-2 RNA is generally detectable in upper and lower respiratory specimens during the acute phase of infection. Negative results do not preclude SARS-CoV-2 infection, do not rule out co-infections with other pathogens, and should not be used as the sole basis for treatment or other patient management decisions. Negative results must be combined with clinical observations, patient history, and epidemiological information. The expected result is Negative. Fact Sheet for Patients: SugarRoll.be Fact Sheet for Healthcare Providers: https://www.woods-mathews.com/ This test is not yet approved or cleared by the [place] FDA and  has been  authorized for detection and/or diagnosis of SARS-CoV-2 by FDA under an Emergency Use Authorization (EUA). This EUA will remain  in effect (meaning this test can be used) for the duration of the COVID-19 declaration under Section 56 4(b)(1) of the Act, 21 U.S.C. section 360bbb-3(b)(1), unless the authorization is terminated or revoked sooner. Performed at [facility], [place] [address] [place], [place] [address]   Culture, blood (Routine x 2)     Status: None (Preliminary result)   Collection Time: 06/12/19 12:02 PM   Specimen: BLOOD RIGHT HAND  Result Value Ref Range Status   Specimen Description BLOOD RIGHT HAND  Final   Special Requests   Final    BOTTLES DRAWN AEROBIC AND ANAEROBIC Blood Culture results may not be optimal due to an inadequate volume of blood received in culture bottles   Culture   Final    NO GROWTH 4 DAYS Performed at [facility], [address] [place], [place] [address]    Report Status PENDING  Incomplete  Urine culture     Status: Abnormal   Collection Time: 06/12/19 12:07 PM   Specimen: In/Out Cath Urine  Result Value Ref Range Status   Specimen Description   Final    IN/OUT CATH URINE Performed at [facility], [address], [place], [place] [address]    Special Requests   Final    NONE Performed at [facility], [address] [place], [place] [address]    Culture (A)  Final    >=100,000 COLONIES/mL CITROBACTER BRAAKII >=100,000 COLONIES/mL ENTEROCOCCUS FAECALIS    Report Status 06/16/2019 FINAL  Final   Organism ID, Bacteria CITROBACTER BRAAKII (A)  Final   Organism ID, Bacteria ENTEROCOCCUS FAECALIS (A)  Final      Susceptibility   Citrobacter braakii - MIC*    CEFAZOLIN >=64 RESISTANT Resistant     CEFTRIAXONE <=1 SENSITIVE Sensitive     CIPROFLOXACIN <=0.25 SENSITIVE Sensitive     GENTAMICIN <=1 SENSITIVE Sensitive     IMIPENEM <=0.25 SENSITIVE Sensitive     NITROFURANTOIN <=16 SENSITIVE Sensitive     TRIMETH/SULFA <=20  SENSITIVE Sensitive     PIP/TAZO <=4 SENSITIVE Sensitive     * >=100,000 COLONIES/mL CITROBACTER BRAAKII   Enterococcus faecalis - MIC*    AMPICILLIN <=2 SENSITIVE Sensitive     LEVOFLOXACIN 0.5 SENSITIVE Sensitive     NITROFURANTOIN <=16 SENSITIVE Sensitive     VANCOMYCIN 1 SENSITIVE Sensitive     * >=100,000 COLONIES/mL ENTEROCOCCUS FAECALIS  Culture, blood (Routine x 2)     Status: Abnormal   Collection Time: 06/12/19 12:18 PM   Specimen: BLOOD RIGHT ARM  Result Value Ref  Range Status   Specimen Description   Final    BLOOD RIGHT ARM Performed at [facility], [address] [place], [place] [address]    Special Requests   Final    BOTTLES DRAWN AEROBIC AND ANAEROBIC Blood Culture results may not be optimal due to an inadequate volume of blood received in culture bottles Performed at [facility], [address] [place], [place] [address]    Culture  Setup Time   Final    GRAM POSITIVE COCCI ANAEROBIC BOTTLE ONLY Gram Stain Report Called to,Read Back By and Verified With: JACKSON @ 1003 ON [id number] BY HENDERSON L. Performed at [facility], [address] [place], [place] [address]    Culture (A)  Final    STAPHYLOCOCCUS SPECIES (COAGULASE NEGATIVE) THE SIGNIFICANCE OF ISOLATING THIS ORGANISM FROM A SINGLE SET OF BLOOD CULTURES WHEN MULTIPLE SETS ARE DRAWN IS UNCERTAIN. PLEASE NOTIFY THE MICROBIOLOGY DEPARTMENT WITHIN ONE WEEK IF SPECIATION AND SENSITIVITIES ARE REQUIRED. Performed at [facility], [place] [address] [place], [place] [address]    Report Status 06/15/2019 FINAL  Final     Radiology Studies: No results found.  Scheduled Meds: . amLODipine  5 mg Oral Daily  . amoxicillin-clavulanate  1 tablet Oral Q12H  . cefdinir  300 mg Oral Q12H  . Chlorhexidine Gluconate Cloth  6 each Topical Daily  . divalproex  1,500 mg Oral QHS  . donepezil  5 mg Oral QHS  . heparin injection (subcutaneous)  5,000 Units Subcutaneous Q8H  . rosuvastatin  5 mg Oral QHS  .  tamsulosin  0.8 mg Oral QHS   Continuous Infusions: . dextrose 5 % and 0.45% NaCl       LOS: 3 days    Time spent: 30 minutes.   Barton Dubois, MD [facility] Pager [phone]  06/16/2019, 12:33 PM

## 2019-06-16 NOTE — Care Management Important Message (Signed)
Important Message  Patient Details  Name: Tony [name] MRN: [id number] Date of Birth: [birth year]   Medicare Important Message Given:  Yes     [name] 06/16/2019, 3:29 PM

## 2019-06-17 LAB — CBC
HCT: 37.6 % — ABNORMAL LOW (ref 39.0–52.0)
HCT: 37.2 % — ABNORMAL LOW (ref 39.0–52.0)
Hemoglobin: 11.2 g/dL — ABNORMAL LOW (ref 13.0–17.0)
Hemoglobin: 11.5 g/dL — ABNORMAL LOW (ref 13.0–17.0)
MCH: 31 pg (ref 26.0–34.0)
MCH: 31.9 pg (ref 26.0–34.0)
MCHC: 29.8 g/dL — ABNORMAL LOW (ref 30.0–36.0)
MCHC: 30.9 g/dL (ref 30.0–36.0)
MCV: 104.2 fL — ABNORMAL HIGH (ref 80.0–100.0)
MCV: 103 fL — ABNORMAL HIGH (ref 80.0–100.0)
Platelets: 134 10*3/uL — ABNORMAL LOW (ref 150–400)
Platelets: 139 10*3/uL — ABNORMAL LOW (ref 150–400)
RBC: 3.61 MIL/uL — ABNORMAL LOW (ref 4.22–5.81)
RBC: 3.61 MIL/uL — ABNORMAL LOW (ref 4.22–5.81)
RDW: 12.2 % (ref 11.5–15.5)
RDW: 12 % (ref 11.5–15.5)
WBC: 9.6 10*3/uL (ref 4.0–10.5)
WBC: 9.5 10*3/uL (ref 4.0–10.5)
nRBC: 0 % (ref 0.0–0.2)
nRBC: 0 % (ref 0.0–0.2)

## 2019-06-17 LAB — CULTURE, BLOOD (ROUTINE X 2): Culture: NO GROWTH

## 2019-06-17 LAB — BASIC METABOLIC PANEL
Anion gap: 10 (ref 5–15)
BUN: 24 mg/dL — ABNORMAL HIGH (ref 8–23)
CO2: 28 mmol/L (ref 22–32)
Calcium: 9.2 mg/dL (ref 8.9–10.3)
Chloride: 113 mmol/L — ABNORMAL HIGH (ref 98–111)
Creatinine, Ser: 1.49 mg/dL — ABNORMAL HIGH (ref 0.61–1.24)
GFR calc Af Amer: 55 mL/min — ABNORMAL LOW (ref >60)
GFR calc non Af Amer: 47 mL/min — ABNORMAL LOW (ref >60)
Glucose, Bld: 119 mg/dL — ABNORMAL HIGH (ref 70–99)
Potassium: 3.9 mmol/L (ref 3.5–5.1)
Sodium: 151 mmol/L — ABNORMAL HIGH (ref 135–145)

## 2019-06-17 MED ORDER — CEFDINIR 300 MG PO CAPS
300.0000 mg | ORAL_CAPSULE | Freq: Two times a day (BID) | ORAL | Status: DC
  Administered 2019-06-17 – 2019-06-20 (×7): 300 mg via ORAL
  Filled 2019-06-17 (×7): qty 1

## 2019-06-17 MED ORDER — LEVOFLOXACIN 500 MG PO TABS: 250.0000 mg | ORAL_TABLET | Freq: Every day | ORAL | Status: DC

## 2019-06-17 MED ORDER — DEXTROSE 5 % IV SOLN
INTRAVENOUS | Status: AC
  Administered 2019-06-17 – 2019-06-18 (×3): via INTRAVENOUS

## 2019-06-17 MED ORDER — AMOXICILLIN 250 MG PO CAPS
500.0000 mg | ORAL_CAPSULE | Freq: Three times a day (TID) | ORAL | Status: DC
  Administered 2019-06-17 – 2019-06-20 (×9): 500 mg via ORAL
  Filled 2019-06-17 (×9): qty 2

## 2019-06-17 NOTE — Progress Notes (Signed)
Patient has been awake,alert, and talking to staff all night. Patient has had increased intake of oral fluids and ate some food off of his supper tray.

## 2019-06-17 NOTE — Progress Notes (Signed)
PROGRESS NOTE  DEIONTA Tony [name] [id number] DOB: 10/24/1950 DOA: 06/12/2019 PCP: Velna Ochs, MD  Brief History:  68 year old male with a history of Alzheimer's dementia, CKD stage III, diabetes mellitus, hypertension, seizure disorder presenting with subjective fevers and chills and generalized malaise.  The patient was recently hospitalized from 04/10/2019 to 04/15/2019 with AMS secondary to UTI.  Urine cultures grew Enterococcus and Providencia.  The patient was discharged home with levofloxacin.  The patient was discharged home with an indwelling Foley catheter.  The patient failed a voiding trial in the urology office.  He subsequently underwent another voiding trial on 05/22/2019 cystoscopy.  He is tentatively scheduled for TURP surgery on 07/10/2019.  Urology was consulted and recommended catheter change during this hospitalization.  Foley catheter was changed on 06/13/2019.  Assessment/Plan: Sepsis -Present on admission -Secondary to CAUTI -Sepsis physiology resolved -WBC improved and fever trending down -Lactic acid peaked 2.8  CAUTI -Initially on cefepime 06/12/2019>> 06/16/2019 -Urine culture growing Enterococcus faecalis and Citrobacter -Continue ampicillin and Cefdinir  Acute on chronic renal failure--CKD stage III -Secondary to sepsis and volume depletion -Baseline creatinine 1.5-1.8 -Serum creatinine peaked 2.98 -Continue IV fluids  Hyponatremia -Discontinue D5 half-normal saline -Start D5W at 100 cc/h  Essential hypertension -Continue amlodipine  Seizure disorder -Continue Depakote  Alzheimer's dementia -Continue donepezil  BPH -continue flomax.  stage II pressure injury -present on admission -frequent repositioning -preventive measures.   coagulase-negative bacteremia -represents contaminant       Disposition Plan:   Home vs SNF 11/19 or 11/20 Family Communication:   No Family at bedside  Consultants:  urology  Code Status:   FULL  DVT Prophylaxis:  [place] Heparin    Procedures: As Listed in Progress Note Above  Antibiotics: Cefepime 11/13>>11/16 Amoxil 11/17>>> Cefdinir 11/17>>     Subjective: Pt is pleasantly confused.  He denies cp, sob, abd pain, n/v/d  Objective: Vitals:   06/16/19 1339 06/16/19 1344 06/16/19 2058 06/17/19 0500  BP: 133/71  (!) 149/67 118/62  Pulse: 78  77 61  Resp: 19  20 20   Temp: (!) 100.8 F (38.2 C) 99.3 F (37.4 C) 99.2 F (37.3 C) 98 F (36.7 C)  TempSrc: Oral Oral Oral Oral  SpO2: 100%  90% 100%  Weight:      Height:        Intake/Output Summary (Last 24 hours) at 06/17/2019 1431 Last data filed at 06/17/2019 0900 Gross per 24 hour  Intake 1452.74 ml  Output 900 ml  Net 552.74 ml   Weight change:  Exam:   General:  Pt is alert, follows commands appropriately, not in acute distress  HEENT: No icterus, No thrush, No neck mass, [place]/AT  Cardiovascular: RRR, S1/S2, no rubs, no gallops  Respiratory: CTA bilaterally, no wheezing, no crackles, no rhonchi  Abdomen: Soft/+BS, non tender, non distended, no guarding  Extremities: No edema, No lymphangitis, No petechiae, No rashes, no synovitis   Data Reviewed: I have personally reviewed following labs and imaging studies Basic Metabolic Panel: Recent Labs  Lab 06/13/19 0754 06/14/19 0408 06/15/19 1200 06/16/19 0631 06/17/19 0825  NA 141 142 148* 151* 151*  K 5.5* 4.8 4.9 4.2 3.9  CL 104 110 110 117* 113*  CO2 24 22 26 27 28   GLUCOSE 172* 137* 109* 107* 119*  BUN 43* 52* 44* 34* 24*  CREATININE 2.98* 2.87* 1.90* 1.68* 1.49*  CALCIUM 9.3 8.9 9.2 8.9 9.2   Liver Function Tests: Recent Labs  Lab 06/12/19 1218  AST 30  ALT 17  ALKPHOS 54  BILITOT 1.2  PROT 8.4*  ALBUMIN 3.7   No results for input(s): LIPASE, AMYLASE in the last 168 hours. No results for input(s): AMMONIA in the last 168 hours. Coagulation Profile: Recent Labs  Lab 06/12/19 1304  INR 1.0   CBC: Recent Labs  Lab  06/12/19 1304 06/13/19 0754 06/17/19 0549 06/17/19 0825  WBC 11.8* 18.9* 9.6 9.5  NEUTROABS 8.6*  --   --   --   HGB 13.8 15.1 11.2* 11.5*  HCT 43.2 47.8 37.6* 37.2*  MCV 98.2 99.8 104.2* 103.0*  PLT 153 176 134* 139*   Cardiac Enzymes: No results for input(s): CKTOTAL, CKMB, CKMBINDEX, TROPONINI in the last 168 hours. BNP: Invalid input(s): POCBNP CBG: No results for input(s): GLUCAP in the last 168 hours. HbA1C: No results for input(s): HGBA1C in the last 72 hours. Urine analysis:    Component Value Date/Time   COLORURINE YELLOW 06/12/2019 1045   APPEARANCEUR CLOUDY (A) 06/12/2019 1045   LABSPEC 1.015 06/12/2019 1045   PHURINE 5.0 06/12/2019 1045   GLUCOSEU NEGATIVE 06/12/2019 1045   HGBUR LARGE (A) 06/12/2019 1045   BILIRUBINUR NEGATIVE 06/12/2019 1045   KETONESUR NEGATIVE 06/12/2019 1045   PROTEINUR 100 (A) 06/12/2019 1045   UROBILINOGEN 0.2 02/07/2010 1547   NITRITE POSITIVE (A) 06/12/2019 1045   LEUKOCYTESUR LARGE (A) 06/12/2019 1045   Sepsis Labs: @LABRCNTIP (procalcitonin:4,lacticidven:4) ) Recent Results (from the past 240 hour(s))  SARS CORONAVIRUS 2 (Wajiha Versteeg 6-24 HRS) Nasopharyngeal Nasopharyngeal Swab     Status: None   Collection Time: 06/12/19 11:57 AM   Specimen: Nasopharyngeal Swab  Result Value Ref Range Status   SARS Coronavirus 2 NEGATIVE NEGATIVE Final    Comment: (NOTE) SARS-CoV-2 target nucleic acids are NOT DETECTED. The SARS-CoV-2 RNA is generally detectable in upper and lower respiratory specimens during the acute phase of infection. Negative results do not preclude SARS-CoV-2 infection, do not rule out co-infections with other pathogens, and should not be used as the sole basis for treatment or other patient management decisions. Negative results must be combined with clinical observations, patient history, and epidemiological information. The expected result is Negative. Fact Sheet for Patients: SugarRoll.be  Fact Sheet for Healthcare Providers: https://www.woods-mathews.com/ This test is not yet approved or cleared by the [place] FDA and  has been authorized for detection and/or diagnosis of SARS-CoV-2 by FDA under an Emergency Use Authorization (EUA). This EUA will remain  in effect (meaning this test can be used) for the duration of the COVID-19 declaration under Section 56 4(b)(1) of the Act, 21 U.S.C. section 360bbb-3(b)(1), unless the authorization is terminated or revoked sooner. Performed at [facility], [place] [address] [place], [place] [address]   Culture, blood (Routine x 2)     Status: None   Collection Time: 06/12/19 12:02 PM   Specimen: BLOOD RIGHT HAND  Result Value Ref Range Status   Specimen Description BLOOD RIGHT HAND  Final   Special Requests   Final    BOTTLES DRAWN AEROBIC AND ANAEROBIC Blood Culture results may not be optimal due to an inadequate volume of blood received in culture bottles   Culture   Final    NO GROWTH 5 DAYS Performed at [facility], [address], [place], [place] [address]    Report Status 06/17/2019 FINAL  Final  Urine culture     Status: Abnormal   Collection Time: 06/12/19 12:07 PM   Specimen: In/Out Cath Urine  Result Value Ref Range Status   Specimen Description   Final    IN/OUT CATH URINE Performed at [facility], [address], [place], [place] [address]    Special Requests   Final    NONE Performed at [facility], [address], [place], [place] [address]    Culture (A)  Final    >=100,000 COLONIES/mL CITROBACTER BRAAKII >=100,000 COLONIES/mL ENTEROCOCCUS FAECALIS    Report Status 06/16/2019 FINAL  Final   Organism ID, Bacteria CITROBACTER BRAAKII (A)  Final   Organism ID, Bacteria ENTEROCOCCUS FAECALIS (A)  Final      Susceptibility   Citrobacter braakii - MIC*    CEFAZOLIN >=64 RESISTANT Resistant     CEFTRIAXONE <=1 SENSITIVE Sensitive     CIPROFLOXACIN <=0.25 SENSITIVE Sensitive      GENTAMICIN <=1 SENSITIVE Sensitive     IMIPENEM <=0.25 SENSITIVE Sensitive     NITROFURANTOIN <=16 SENSITIVE Sensitive     TRIMETH/SULFA <=20 SENSITIVE Sensitive     PIP/TAZO <=4 SENSITIVE Sensitive     * >=100,000 COLONIES/mL CITROBACTER BRAAKII   Enterococcus faecalis - MIC*    AMPICILLIN <=2 SENSITIVE Sensitive     LEVOFLOXACIN 0.5 SENSITIVE Sensitive     NITROFURANTOIN <=16 SENSITIVE Sensitive     VANCOMYCIN 1 SENSITIVE Sensitive     * >=100,000 COLONIES/mL ENTEROCOCCUS FAECALIS  Culture, blood (Routine x 2)     Status: Abnormal   Collection Time: 06/12/19 12:18 PM   Specimen: BLOOD RIGHT ARM  Result Value Ref Range Status   Specimen Description   Final    BLOOD RIGHT ARM Performed at [facility], [address] [place], [place] [address]    Special Requests   Final    BOTTLES DRAWN AEROBIC AND ANAEROBIC Blood Culture results may not be optimal due to an inadequate volume of blood received in culture bottles Performed at [facility], [address], [place], [place] [address]    Culture  Setup Time   Final    GRAM POSITIVE COCCI ANAEROBIC BOTTLE ONLY Gram Stain Report Called to,Read Back By and Verified With: JACKSON @ 1003 ON [id number] BY HENDERSON L. Performed at [facility], [address], [place], [place] [address]    Culture (A)  Final    STAPHYLOCOCCUS SPECIES (COAGULASE NEGATIVE) THE SIGNIFICANCE OF ISOLATING THIS ORGANISM FROM A SINGLE SET OF BLOOD CULTURES WHEN MULTIPLE SETS ARE DRAWN IS UNCERTAIN. PLEASE NOTIFY THE MICROBIOLOGY DEPARTMENT WITHIN ONE WEEK IF SPECIATION AND SENSITIVITIES ARE REQUIRED. Performed at [facility], [place] [address], [place], [place] [address]    Report Status 06/15/2019 FINAL  Final     Scheduled Meds: . amLODipine  5 mg Oral Daily  . Chlorhexidine Gluconate Cloth  6 each Topical Daily  . divalproex  1,500 mg Oral QHS  . donepezil  5 mg Oral QHS  . heparin injection (subcutaneous)  5,000 Units Subcutaneous Q8H  . levofloxacin   250 mg Oral Daily  . rosuvastatin  5 mg Oral QHS  . tamsulosin  0.8 mg Oral QHS   Continuous Infusions: . dextrose 5 % and 0.45% NaCl 50 mL/hr at 06/16/19 1320    Procedures/Studies: US Renal  Result Date: 06/14/2019 CLINICAL DATA:  Initial evaluation for acute renal insufficiency. EXAM: RENAL / URINARY TRACT ULTRASOUND COMPLETE COMPARISON:  None. FINDINGS: Right Kidney: Renal measurements: 12.3 x 4.5 x 4.7 cm = volume: 140 mL. Echogenicity within normal limits. No nephrolithiasis or hydronephrosis. 3.3 x 3.2 x 2.1 cm simple cyst present at  the interpolar region. 1.8 x 1.4 x 1.5 cm simple cyst present at the lower pole. Left Kidney: Renal measurements: 12.6 x 6.8 x 6.1 cm = volume: 273 mL. Echogenicity within normal limits. No nephrolithiasis or hydronephrosis. 2.7 x 2.2 x 2.3 cm simple cyst present at the lower pole. Bladder: Decompressed with a Foley catheter in place. Other: None. IMPRESSION: 1. No hydronephrosis or other acute finding. 2. Simple bilateral renal cysts as above. Electronically Signed   By: Jeannine Boga M.D.   On: 06/14/2019 14:22   Dg Chest Portable 1 View  Result Date: 06/12/2019 CLINICAL DATA:  Fever. EXAM: PORTABLE CHEST 1 VIEW COMPARISON:  April 10, 2019. FINDINGS: The heart size and mediastinal contours are within normal limits. Both lungs are clear. The visualized skeletal structures are unremarkable. IMPRESSION: No active disease. Electronically Signed   By: Marijo Conception M.D.   On: 06/12/2019 11:04    Orson Eva, DO  [facility] Pager [phone]  If 7PM-7AM, please contact night-coverage [email or web address] Password [email or web address] 06/17/2019, 2:31 PM   LOS: 4 days

## 2019-06-17 NOTE — Care Management Important Message (Signed)
Important Message  Patient Details  Name: Tony [name] MRN: [id number] Date of Birth: [birth year]   Medicare Important Message Given:  Yes     [name] 06/17/2019, 2:15 PM

## 2019-06-18 ENCOUNTER — Encounter (HOSPITAL_COMMUNITY): Payer: Self-pay | Admitting: Radiology

## 2019-06-18 ENCOUNTER — Inpatient Hospital Stay (HOSPITAL_COMMUNITY): Payer: Medicare (Managed Care)

## 2019-06-18 DIAGNOSIS — A419 Sepsis, unspecified organism: Secondary | ICD-10-CM

## 2019-06-18 LAB — CBC
HCT: 36.7 % — ABNORMAL LOW (ref 39.0–52.0)
Hemoglobin: 11.3 g/dL — ABNORMAL LOW (ref 13.0–17.0)
MCH: 31.4 pg (ref 26.0–34.0)
MCHC: 30.8 g/dL (ref 30.0–36.0)
MCV: 101.9 fL — ABNORMAL HIGH (ref 80.0–100.0)
Platelets: 141 10*3/uL — ABNORMAL LOW (ref 150–400)
RBC: 3.6 MIL/uL — ABNORMAL LOW (ref 4.22–5.81)
RDW: 12.2 % (ref 11.5–15.5)
WBC: 9.1 10*3/uL (ref 4.0–10.5)
nRBC: 0 % (ref 0.0–0.2)

## 2019-06-18 LAB — BASIC METABOLIC PANEL
Anion gap: 9 (ref 5–15)
BUN: 18 mg/dL (ref 8–23)
CO2: 29 mmol/L (ref 22–32)
Calcium: 8.9 mg/dL (ref 8.9–10.3)
Chloride: 106 mmol/L (ref 98–111)
Creatinine, Ser: 1.37 mg/dL — ABNORMAL HIGH (ref 0.61–1.24)
GFR calc Af Amer: >60 mL/min (ref >60)
GFR calc non Af Amer: 52 mL/min — ABNORMAL LOW (ref >60)
Glucose, Bld: 133 mg/dL — ABNORMAL HIGH (ref 70–99)
Potassium: 3.4 mmol/L — ABNORMAL LOW (ref 3.5–5.1)
Sodium: 144 mmol/L (ref 135–145)

## 2019-06-18 MED ORDER — POTASSIUM CHLORIDE CRYS ER 20 MEQ PO TBCR
20.0000 meq | EXTENDED_RELEASE_TABLET | Freq: Once | ORAL | Status: AC
  Administered 2019-06-18: 20 meq via ORAL
  Filled 2019-06-18: qty 1

## 2019-06-18 NOTE — NC FL2 (Signed)
[place] LEVEL OF CARE SCREENING TOOL     IDENTIFICATION  Patient Name: Tony [name] Birthdate: Dec 21, 1950 Sex: male Admission Date (Current Location): 06/12/2019  [facility] and [place] Number:  [place] and Address:  [place] [address], [place]      Provider Number: [id number]  Attending Physician Name and Address:  Orson Eva, MD  Relative Name and Phone Number:  Jamarrie Angello, [phone]    Current Level of Care: Hospital Recommended Level of Care: [place] Prior Approval Number:    Date Approved/Denied:   PASRR Number: [id number]  Discharge Plan: SNF    Current Diagnoses: Patient Active Problem List   Diagnosis Date Noted  . Pressure injury of skin 06/13/2019  . Complicated UTI (urinary tract infection) 06/12/2019  . Sepsis secondary to UTI ([place]) 06/12/2019  . Acute renal failure superimposed on stage 3b chronic kidney disease ([place]) 06/12/2019  . BPH with obstruction/lower urinary tract symptoms 06/12/2019  . Sepsis with acute renal failure without septic shock ([place])   . Acute urinary retention   . UTI (urinary tract infection) 04/10/2019  . Acute UTI 04/10/2019  . Seizure ([place]) 12/15/2018  . CAP (community acquired pneumonia) 12/14/2018  . Dementia ([place]) 10/20/2018  . Enlarged prostate without lower urinary tract symptoms ([place]) 06/30/2018  . Hyperprolactinemia ([place]) 11/20/2017  . HLD (hyperlipidemia) 09/02/2016  . Chronic kidney disease 03/19/2016  . Gynecomastia, male 04/25/2015  . Routine health maintenance 04/25/2015  . Erectile disorder due to medical condition in male patient 05/07/2014  . Hypertension 12/12/2011  . Prediabetes 12/12/2011  . Seizure disorder ([place]) 12/12/2011    Orientation RESPIRATION BLADDER Height & Weight     Self  Normal Indwelling catheter Weight: 213 lb 13.5 oz (97 kg) Height:  5\' 11"  (180.3 cm)  BEHAVIORAL SYMPTOMS/MOOD  NEUROLOGICAL BOWEL NUTRITION STATUS      Continent Diet(Heart Healthy)  AMBULATORY STATUS COMMUNICATION OF NEEDS Skin   Extensive Assist Verbally PU Stage and Appropriate Care(Stage II: scrotum, posterior; buttocks, right)                       Personal Care Assistance Level of Assistance  Bathing, Feeding, Dressing Bathing Assistance: Maximum assistance Feeding assistance: Limited assistance Dressing Assistance: Maximum assistance     Functional Limitations Info  Sight, Hearing, Speech Sight Info: Adequate Hearing Info: Adequate Speech Info: Adequate    SPECIAL CARE FACTORS FREQUENCY  PT (By licensed PT)     PT Frequency: 5x/week              Contractures Contractures Info: Not present    Additional Factors Info  Code Status, Allergies, Psychotropic Code Status Info: Full Code Allergies Info: NKA Psychotropic Info: Depakote         Current Medications (06/18/2019):  This is the current hospital active medication list Current Facility-Administered Medications  Medication Dose Route Frequency Provider Last Rate Last Dose  . acetaminophen (TYLENOL) tablet 650 mg  650 mg Oral Q6H PRN Barton Dubois, MD   650 mg at 06/17/19 2213   Or  . acetaminophen (TYLENOL) suppository 650 mg  650 mg Rectal Q6H PRN Barton Dubois, MD      . amLODipine (NORVASC) tablet 5 mg  5 mg Oral Daily Barton Dubois, MD   5 mg at 06/18/19 1054  . amoxicillin (AMOXIL) capsule 500 mg  500 mg Oral Franco Collet, MD   500 mg at 06/18/19 0450  .  cefdinir (OMNICEF) capsule 300 mg  300 mg Oral Therisa Doyne, MD   300 mg at 06/18/19 1054  . Chlorhexidine Gluconate Cloth 2 % PADS 6 each  6 each Topical Daily Barton Dubois, MD   6 each at 06/18/19 1054  . dextrose 5 % solution   Intravenous Continuous Tat, David, MD 100 mL/hr at 06/18/19 1103    . divalproex (DEPAKOTE ER) 24 hr tablet 1,500 mg  1,500 mg Oral QHS Barton Dubois, MD   1,500 mg at 06/17/19 2158  . donepezil (ARICEPT) tablet 5 mg   5 mg Oral QHS Barton Dubois, MD   5 mg at 06/17/19 2203  . heparin injection 5,000 Units  5,000 Units Subcutaneous Q8H Barton Dubois, MD   5,000 Units at 06/18/19 0450  . ondansetron (ZOFRAN) tablet 4 mg  4 mg Oral Q6H PRN Barton Dubois, MD       Or  . ondansetron [facility]) injection 4 mg  4 mg Intravenous Q6H PRN Barton Dubois, MD      . rosuvastatin (CRESTOR) tablet 5 mg  5 mg Oral QHS Barton Dubois, MD   5 mg at 06/17/19 2202  . tamsulosin (FLOMAX) capsule 0.8 mg  0.8 mg Oral QHS Barton Dubois, MD   0.8 mg at 06/17/19 2200     Discharge Medications: Please see discharge summary for a list of discharge medications.  Relevant Imaging Results:  Relevant Lab Results:   Additional Information SSN [id number]. PATIENT IS ACTIVE WITH [facility]  Camara Renstrom, Clydene Pugh, LCSW

## 2019-06-18 NOTE — Evaluation (Addendum)
Physical Therapy Evaluation Patient Details Name: Tony [name] MRN: [id number] DOB: [birth year] Today's Date: 06/18/2019   History of Present Illness  Tony [name] is a 68 y.o. male with a past medical history significant for Alzheimer's dementia, BPH with lower urinary tract symptoms, chronic kidney disease stage III, hypertension, hyperlipidemia and seizure disorder; who presented to the hospital secondary to chills and not feeling well.  After reviewing her records he was recently seen for cystoscopy and voiding trial after experiencing urinary retention with the need of Foley catheter.  Patient denies nausea, vomiting, abdominal pain, cough, headaches, focal weakness, hematochezia, hematemesis, melena or any other complaints.    Clinical Impression  Pt with significant cognitive deficits, able to arouse but unable to answer questions appropriately. Pt oriented to self and place, but disoriented to year and unable to answer questions regarding PLOF. Pt unable to follow one step verbal commands, with improved performance with tactile and initiation cues, but continues to require max assist. Pt limited by cognition, unable to attempt STS or ambulation with RW due to poor sitting balance. Pt requires max assist to maintain sitting at EOB with support for 2 minutes before returning to supine. No shortness of breath noted with bed mobility. Pt left supine in bed, call bell in lap and bed alarm on; RN notified of evaluation and pt's status. Unable to recommend equipment at this time or determine PLOF due to pt's cognitive status and no family present during evaluation. Pt will benefit from continued physical therapy in hospital and recommended venue below to increase strength, balance, endurance for safe ADLs and gait.     Follow Up Recommendations SNF    Equipment Recommendations  Other (comment)(unable to determine due to unknown equipment at home)    Recommendations for Other Services Speech  consult     Precautions / Restrictions Precautions Precautions: Fall Restrictions Weight Bearing Restrictions: No      Mobility  Bed Mobility Overal bed mobility: Needs Assistance Bed Mobility: Rolling;Supine to Sit;Sit to Supine Rolling: Max assist;Total assist   Supine to sit: Max assist;Total assist Sit to supine: Max assist;Total assist   General bed mobility comments: Pt fluctuates between max and total assist due to cognition, unable to follow one step commands but occasionally assists with tactile and initiation cues  Transfers                 General transfer comment: unable  Ambulation/Gait             General Gait Details: unable  Stairs            Wheelchair Mobility    Modified Rankin (Stroke Patients Only)       Balance Overall balance assessment: Needs assistance Sitting-balance support: Feet supported;Bilateral upper extremity supported Sitting balance-Leahy Scale: Zero Sitting balance - Comments: max assist to maintian sitting upright at bedside, occasionally able to assist requiring only moderate assist, but overall only tolerates sitting EOB for 2 minutes Postural control: (kyphotic trunk, leaning all directions)   Standing balance-Leahy Scale: Zero Standing balance comment: unable                             Pertinent Vitals/Pain Pain Assessment: Faces Faces Pain Scale: No hurt    Home Living Family/patient expects to be discharged to:: Skilled nursing facility                 Additional Comments: Per chart review, pt  lives with children. Pt unable to answer questions appropriately and no family at bedside.    Prior Function           Comments: Per chart review, pt previously independent with ambulation and no AD. Pt unable to answer questions appropriately and no family at bedside currently.     Hand Dominance        Extremity/Trunk Assessment   Upper Extremity Assessment Upper Extremity  Assessment: Generalized weakness    Lower Extremity Assessment Lower Extremity Assessment: Generalized weakness    Cervical / Trunk Assessment Cervical / Trunk Assessment: Kyphotic  Communication   Communication: Other (comment)(Pt unable to answer questions appropriately, occasionally states his birthday, gives conflicting answers, or reports "almost ready" to questions asked.)  Cognition Arousal/Alertness: Lethargic Behavior During Therapy: Flat affect Overall Cognitive Status: History of cognitive impairments - at baseline                                 General Comments: Pt does not respond to one step commands, responds 25% of the time to tactile and initiation cues.      General Comments      Exercises     Assessment/Plan    PT Assessment Patient needs continued PT services  PT Problem List Decreased strength;Decreased activity tolerance;Decreased balance;Decreased mobility;Decreased cognition;Decreased knowledge of use of DME;Decreased safety awareness       PT Treatment Interventions DME instruction;Gait training;Functional mobility training;Therapeutic activities;Therapeutic exercise;Balance training;Neuromuscular re-education;Patient/family education;Manual techniques;Modalities    PT Goals (Current goals can be found in the Care Plan section)  Acute Rehab PT Goals Patient Stated Goal: unstated PT Goal Formulation: With patient Time For Goal Achievement: 07/02/19 Potential to Achieve Goals: Poor    Frequency Min 2X/week   Barriers to discharge        Co-evaluation               AM-PAC PT "6 Clicks" Mobility  Outcome Measure Help needed turning from your back to your side while in a flat bed without using bedrails?: Total Help needed moving from lying on your back to sitting on the side of a flat bed without using bedrails?: Total Help needed moving to and from a bed to a chair (including a wheelchair)?: Total Help needed standing up  from a chair using your arms (e.g., wheelchair or bedside chair)?: Total Help needed to walk in hospital room?: Total Help needed climbing 3-5 steps with a railing? : Total 6 Click Score: 6    End of Session   Activity Tolerance: Patient limited by lethargy;Other (comment)(limited due to cognition) Patient left: in bed;with call bell/phone within reach;with bed alarm set Nurse Communication: Mobility status PT Visit Diagnosis: Unsteadiness on feet (R26.81);Other abnormalities of gait and mobility (R26.89);Muscle weakness (generalized) (M62.81)    Time: [id number] PT Time Calculation (min) (ACUTE ONLY): 29 min   Charges:   PT Evaluation $PT Eval Moderate Complexity: 1 Mod PT Treatments $Therapeutic Activity: 8-22 mins        Tori Cyndal Kasson PT, DPT 06/18/19, 11:23 AM [phone]

## 2019-06-18 NOTE — Plan of Care (Signed)
  Problem: Acute Rehab PT Goals(only PT should resolve) Goal: Pt Will Go Supine/Side To Sit 06/18/2019 1125 by Laneta Simmers B, PT Outcome: Progressing 06/18/2019 1124 by Laneta Simmers B, PT Outcome: Progressing [place] (Taken 06/18/2019 1124) Pt will go Supine/Side to Sit: with moderate assist Goal: Pt Will Go Sit To Supine/Side 06/18/2019 1125 by Laneta Simmers B, PT Outcome: Progressing 06/18/2019 1124 by [place], [place] (Taken 06/18/2019 1124) Pt will go Sit to Supine/Side: with moderate assist Goal: Patient Will Perform Sitting Balance 06/18/2019 1125 by Laneta Simmers B, PT Outcome: Progressing 06/18/2019 1124 by [place], [place] (Taken 06/18/2019 1124) Patient will perform sitting balance:  with minimal assist  3- 5 min Goal: Patient Will Transfer Sit To/From Stand 06/18/2019 1125 by Laneta Simmers B, PT Outcome: Progressing 06/18/2019 1124 by [place], [place] (Taken 06/18/2019 1124) Patient will transfer sit to/from stand: with moderate assist Goal: Pt Will Transfer Bed To Chair/Chair To Bed 06/18/2019 1125 by Laneta Simmers B, PT Outcome: Progressing 06/18/2019 1124 by Laneta Simmers B, [place] (Taken 06/18/2019 1124) Pt will Transfer Bed to Chair/Chair to Bed: with mod assist   Tori Obelia Bonello PT, DPT 06/18/19, 11:25 AM [phone]

## 2019-06-18 NOTE — Progress Notes (Signed)
PROGRESS NOTE  Tony [name] [id number] DOB: June 20, 1951 DOA: 06/12/2019 PCP: Velna Ochs, MD  Brief History:  68 year old male with a history of Alzheimer's dementia, CKD stage III, diabetes mellitus, hypertension, seizure disorder presenting with subjective fevers and chills and generalized malaise.  The patient was recently hospitalized from 04/10/2019 to 04/15/2019 with AMS secondary to UTI.  Urine cultures grew Enterococcus and Providencia.  The patient was discharged home with levofloxacin.  The patient was discharged home with an indwelling Foley catheter.  The patient failed a voiding trial in the urology office.  He subsequently underwent another voiding trial on 05/22/2019 cystoscopy.  He is tentatively scheduled for TURP surgery on 07/10/2019.  Urology was consulted and recommended catheter change during this hospitalization.  Foley catheter was changed on 06/13/2019.  Assessment/Plan: Sepsis -Present on admission -Secondary to CAUTI -Sepsis physiology resolved -WBC improved and fever trending down -Lactic acid peaked 2.8  CAUTI -Initially on cefepime 06/12/2019>> 06/16/2019 -Urine culture growing Enterococcus faecalis and Citrobacter -Continue ampicillin and Cefdinir  Acute on chronic renal failure--CKD stage III -Secondary to sepsis and volume depletion -Baseline creatinine 1.5-1.8 -Serum creatinine peaked 2.98 -Continue IV fluids  Hypernatremia -Discontinue D5 half-normal saline -Continue D5W at 100 cc/h  Essential hypertension -Continue amlodipine  Seizure disorder -Continue Depakote  Alzheimer's dementia -Continue donepezil  BPH -continue flomax.  stage II pressure injury -present on admission -frequent repositioning -preventive measures.   coagulase-negative bacteremia -represents contaminant  Hypokalemia -replete -check mag       Disposition Plan:  SNF 11/20 if bed available Family Communication:   Brother  updated on 11/19  Consultants:  urology  Code Status:  FULL  DVT Prophylaxis:  [place] Heparin    Procedures: As Listed in Progress Note Above  Antibiotics: Cefepime 11/13>>11/16 Amoxil 11/17>>> Cefdinir 11/17>>    Subjective: Patient denies fevers, chills, headache, chest pain, dyspnea, nausea, vomiting, diarrhea, abdominal pain, dysuria, hematuria, hematochezia, and melena.   Objective: Vitals:   06/17/19 0500 06/17/19 1455 06/17/19 2157 06/18/19 0446  BP: 118/62 129/62 136/86 128/62  Pulse: 61 70 84 66  Resp: 20 18 20 17   Temp: 98 F (36.7 C) 99.8 F (37.7 C) (!) 100.5 F (38.1 C) 98.1 F (36.7 C)  TempSrc: Oral Oral Oral Oral  SpO2: 100% 100% 96% 96%  Weight:      Height:        Intake/Output Summary (Last 24 hours) at 06/18/2019 1559 Last data filed at 06/18/2019 0300 Gross per 24 hour  Intake 1233.91 ml  Output 1400 ml  Net -166.09 ml   Weight change:  Exam:   General:  Pt is alert, follows commands appropriately, not in acute distress  HEENT: No icterus, No thrush, No neck mass, [place]/AT  Cardiovascular: RRR, S1/S2, no rubs, no gallops  Respiratory: bibasilar rales. No wheeze  Abdomen: Soft/+BS, non tender, non distended, no guarding  Extremities: No edema, No lymphangitis, No petechiae, No rashes, no synovitis   Data Reviewed: I have personally reviewed following labs and imaging studies Basic Metabolic Panel: Recent Labs  Lab 06/14/19 0408 06/15/19 1200 06/16/19 0631 06/17/19 0825 06/18/19 0706  NA 142 148* 151* 151* 144  K 4.8 4.9 4.2 3.9 3.4*  CL 110 110 117* 113* 106  CO2 22 26 27 28 29   GLUCOSE 137* 109* 107* 119* 133*  BUN 52* 44* 34* 24* 18  CREATININE 2.87* 1.90* 1.68* 1.49* 1.37*  CALCIUM 8.9 9.2 8.9 9.2 8.9   Liver  Function Tests: Recent Labs  Lab 06/12/19 1218  AST 30  ALT 17  ALKPHOS 54  BILITOT 1.2  PROT 8.4*  ALBUMIN 3.7   No results for input(s): LIPASE, AMYLASE in the last 168 hours. No results for  input(s): AMMONIA in the last 168 hours. Coagulation Profile: Recent Labs  Lab 06/12/19 1304  INR 1.0   CBC: Recent Labs  Lab 06/12/19 1304 06/13/19 0754 06/17/19 0549 06/17/19 0825 06/18/19 0706  WBC 11.8* 18.9* 9.6 9.5 9.1  NEUTROABS 8.6*  --   --   --   --   HGB 13.8 15.1 11.2* 11.5* 11.3*  HCT 43.2 47.8 37.6* 37.2* 36.7*  MCV 98.2 99.8 104.2* 103.0* 101.9*  PLT 153 176 134* 139* 141*   Cardiac Enzymes: No results for input(s): CKTOTAL, CKMB, CKMBINDEX, TROPONINI in the last 168 hours. BNP: Invalid input(s): POCBNP CBG: No results for input(s): GLUCAP in the last 168 hours. HbA1C: No results for input(s): HGBA1C in the last 72 hours. Urine analysis:    Component Value Date/Time   COLORURINE YELLOW 06/12/2019 1045   APPEARANCEUR CLOUDY (A) 06/12/2019 1045   LABSPEC 1.015 06/12/2019 1045   PHURINE 5.0 06/12/2019 1045   GLUCOSEU NEGATIVE 06/12/2019 1045   HGBUR LARGE (A) 06/12/2019 1045   BILIRUBINUR NEGATIVE 06/12/2019 1045   KETONESUR NEGATIVE 06/12/2019 1045   PROTEINUR 100 (A) 06/12/2019 1045   UROBILINOGEN 0.2 02/07/2010 1547   NITRITE POSITIVE (A) 06/12/2019 1045   LEUKOCYTESUR LARGE (A) 06/12/2019 1045   Sepsis Labs: @LABRCNTIP (procalcitonin:4,lacticidven:4) ) Recent Results (from the past 240 hour(s))  SARS CORONAVIRUS 2 (Latorie Montesano 6-24 HRS) Nasopharyngeal Nasopharyngeal Swab     Status: None   Collection Time: 06/12/19 11:57 AM   Specimen: Nasopharyngeal Swab  Result Value Ref Range Status   SARS Coronavirus 2 NEGATIVE NEGATIVE Final    Comment: (NOTE) SARS-CoV-2 target nucleic acids are NOT DETECTED. The SARS-CoV-2 RNA is generally detectable in upper and lower respiratory specimens during the acute phase of infection. Negative results do not preclude SARS-CoV-2 infection, do not rule out co-infections with other pathogens, and should not be used as the sole basis for treatment or other patient management decisions. Negative results must be combined  with clinical observations, patient history, and epidemiological information. The expected result is Negative. Fact Sheet for Patients: SugarRoll.be Fact Sheet for Healthcare Providers: https://www.woods-mathews.com/ This test is not yet approved or cleared by the [place] FDA and  has been authorized for detection and/or diagnosis of SARS-CoV-2 by FDA under an Emergency Use Authorization (EUA). This EUA will remain  in effect (meaning this test can be used) for the duration of the COVID-19 declaration under Section 56 4(b)(1) of the Act, 21 U.S.C. section 360bbb-3(b)(1), unless the authorization is terminated or revoked sooner. Performed at [facility], [place] [address], [place], [place] [address]   Culture, blood (Routine x 2)     Status: None   Collection Time: 06/12/19 12:02 PM   Specimen: BLOOD RIGHT HAND  Result Value Ref Range Status   Specimen Description BLOOD RIGHT HAND  Final   Special Requests   Final    BOTTLES DRAWN AEROBIC AND ANAEROBIC Blood Culture results may not be optimal due to an inadequate volume of blood received in culture bottles   Culture   Final    NO GROWTH 5 DAYS Performed at [facility], [address], [place], [place] [address]    Report Status 06/17/2019 FINAL  Final  Urine culture  Status: Abnormal   Collection Time: 06/12/19 12:07 PM   Specimen: In/Out Cath Urine  Result Value Ref Range Status   Specimen Description   Final    IN/OUT CATH URINE Performed at [facility], [address], [place], [place] [address]    Special Requests   Final    NONE Performed at [facility], [address], [place], [place] [address]    Culture (A)  Final    >=100,000 COLONIES/mL CITROBACTER BRAAKII >=100,000 COLONIES/mL ENTEROCOCCUS FAECALIS    Report Status 06/16/2019 FINAL  Final   Organism ID, Bacteria CITROBACTER BRAAKII (A)  Final   Organism ID, Bacteria ENTEROCOCCUS FAECALIS (A)  Final       Susceptibility   Citrobacter braakii - MIC*    CEFAZOLIN >=64 RESISTANT Resistant     CEFTRIAXONE <=1 SENSITIVE Sensitive     CIPROFLOXACIN <=0.25 SENSITIVE Sensitive     GENTAMICIN <=1 SENSITIVE Sensitive     IMIPENEM <=0.25 SENSITIVE Sensitive     NITROFURANTOIN <=16 SENSITIVE Sensitive     TRIMETH/SULFA <=20 SENSITIVE Sensitive     PIP/TAZO <=4 SENSITIVE Sensitive     * >=100,000 COLONIES/mL CITROBACTER BRAAKII   Enterococcus faecalis - MIC*    AMPICILLIN <=2 SENSITIVE Sensitive     LEVOFLOXACIN 0.5 SENSITIVE Sensitive     NITROFURANTOIN <=16 SENSITIVE Sensitive     VANCOMYCIN 1 SENSITIVE Sensitive     * >=100,000 COLONIES/mL ENTEROCOCCUS FAECALIS  Culture, blood (Routine x 2)     Status: Abnormal   Collection Time: 06/12/19 12:18 PM   Specimen: BLOOD RIGHT ARM  Result Value Ref Range Status   Specimen Description   Final    BLOOD RIGHT ARM Performed at [facility], [address], [place], [place] [address]    Special Requests   Final    BOTTLES DRAWN AEROBIC AND ANAEROBIC Blood Culture results may not be optimal due to an inadequate volume of blood received in culture bottles Performed at [facility], [address] [place], [place] [address]    Culture  Setup Time   Final    GRAM POSITIVE COCCI ANAEROBIC BOTTLE ONLY Gram Stain Report Called to,Read Back By and Verified With: JACKSON @ 1003 ON [id number] BY HENDERSON L. Performed at [facility], [address], [place], [place] [address]    Culture (A)  Final    STAPHYLOCOCCUS SPECIES (COAGULASE NEGATIVE) THE SIGNIFICANCE OF ISOLATING THIS ORGANISM FROM A SINGLE SET OF BLOOD CULTURES WHEN MULTIPLE SETS ARE DRAWN IS UNCERTAIN. PLEASE NOTIFY THE MICROBIOLOGY DEPARTMENT WITHIN ONE WEEK IF SPECIATION AND SENSITIVITIES ARE REQUIRED. Performed at [facility], [place] [address], [place], [place] [address]    Report Status 06/15/2019 FINAL  Final     Scheduled Meds: . amLODipine  5 mg Oral Daily  . amoxicillin  500 mg Oral  Q8H  . cefdinir  300 mg Oral Q12H  . Chlorhexidine Gluconate Cloth  6 each Topical Daily  . divalproex  1,500 mg Oral QHS  . donepezil  5 mg Oral QHS  . heparin injection (subcutaneous)  5,000 Units Subcutaneous Q8H  . rosuvastatin  5 mg Oral QHS  . tamsulosin  0.8 mg Oral QHS   Continuous Infusions:  Procedures/Studies: US Renal  Result Date: 06/14/2019 CLINICAL DATA:  Initial evaluation for acute renal insufficiency. EXAM: RENAL / URINARY TRACT ULTRASOUND COMPLETE COMPARISON:  None. FINDINGS: Right Kidney: Renal measurements: 12.3 x 4.5 x 4.7 cm = volume: 140 mL. Echogenicity within normal limits. No nephrolithiasis or hydronephrosis.  3.3 x 3.2 x 2.1 cm simple cyst present at the interpolar region. 1.8 x 1.4 x 1.5 cm simple cyst present at the lower pole. Left Kidney: Renal measurements: 12.6 x 6.8 x 6.1 cm = volume: 273 mL. Echogenicity within normal limits. No nephrolithiasis or hydronephrosis. 2.7 x 2.2 x 2.3 cm simple cyst present at the lower pole. Bladder: Decompressed with a Foley catheter in place. Other: None. IMPRESSION: 1. No hydronephrosis or other acute finding. 2. Simple bilateral renal cysts as above. Electronically Signed   By: Jeannine Boga M.D.   On: 06/14/2019 14:22   Dg Chest Portable 1 View  Result Date: 06/12/2019 CLINICAL DATA:  Fever. EXAM: PORTABLE CHEST 1 VIEW COMPARISON:  April 10, 2019. FINDINGS: The heart size and mediastinal contours are within normal limits. Both lungs are clear. The visualized skeletal structures are unremarkable. IMPRESSION: No active disease. Electronically Signed   By: Marijo Conception M.D.   On: 06/12/2019 11:04    Orson Eva, DO  [facility] Pager [phone]  If 7PM-7AM, please contact night-coverage [email or web address] Password [email or web address] 06/18/2019, 3:59 PM   LOS: 5 days

## 2019-06-18 NOTE — TOC Initial Note (Signed)
Transition of Care [facility]) - Initial/Assessment Note    Patient Details  Name: Tony [name] MRN: [id number] Date of Birth: 1951-01-19  [facility] of [facility]) CM/SW Contact:    Ihor Gully, LCSW Phone Number: 06/18/2019, 1:45 PM  Clinical Narrative:                 Patient's brother, [name], provided hx. PT evaluation and recommendations discussed. [name] is agreeable to short term rehab. Discussed that [facility] has facilities that they utilize. [name]'s first choice is [facility]. Referral sent to [facility].   Expected Discharge Plan: Skilled Nursing Facility Barriers to Discharge: Continued Medical Work up   Patient Goals and CMS Choice Patient states their goals for this hospitalization and ongoing recovery are:: To complete rehab and return home.   Choice offered to / list presented to : Sibling  Expected Discharge Plan and Services Expected Discharge Plan: [place] Acute Care Choice: [place]                                        Prior Living Arrangements/Services   Lives with:: Self Patient language and need for interpreter reviewed:: Yes Do you feel safe going back to the place where you live?: Yes      Need for Family Participation in Patient Care: Yes (Comment) Care giver support system in place?: Yes (comment)   Criminal Activity/Legal Involvement Pertinent to Current Situation/Hospitalization: No - Comment as needed  Activities of Daily Living Home Assistive Devices/Equipment: Walker (specify type) ADL Screening (condition at time of admission) Patient's cognitive ability adequate to safely complete daily activities?: No Is the patient deaf or have difficulty hearing?: Yes Does the patient have difficulty seeing, even when wearing glasses/contacts?: No Does the patient have difficulty concentrating, remembering, or making decisions?: Yes Patient able to express need for assistance with ADLs?: No Does the patient  have difficulty dressing or bathing?: Yes Independently performs ADLs?: No Communication: Independent Dressing (OT): Needs assistance Is this a change from baseline?: Pre-admission baseline Grooming: Needs assistance Is this a change from baseline?: Pre-admission baseline Feeding: Independent Bathing: Needs assistance Is this a change from baseline?: Pre-admission baseline Toileting: Needs assistance Is this a change from baseline?: Pre-admission baseline In/Out Bed: Needs assistance Is this a change from baseline?: Pre-admission baseline Walks in Home: Needs assistance Is this a change from baseline?: Pre-admission baseline Does the patient have difficulty walking or climbing stairs?: Yes Weakness of Legs: Both Weakness of Arms/Hands: None  Permission Sought/Granted            Permission granted to share info w Relationship: [name], brother     Emotional Assessment Appearance:: Appears stated age   Affect (typically observed): Unable to Assess Orientation: : Oriented to Self Alcohol / Substance Use: Not Applicable Psych Involvement: No (comment)  Admission diagnosis:  Sepsis, due to unspecified organism, unspecified whether acute organ dysfunction present [facility]) [A41.9] Patient Active Problem List   Diagnosis Date Noted  . Pressure injury of skin 06/13/2019  . Complicated UTI (urinary tract infection) 06/12/2019  . Sepsis secondary to UTI ([place]) 06/12/2019  . Acute renal failure superimposed on stage 3b chronic kidney disease ([place]) 06/12/2019  . BPH with obstruction/lower urinary tract symptoms 06/12/2019  . Sepsis with acute renal failure without septic shock ([place])   . Acute urinary retention   . UTI (urinary tract infection) 04/10/2019  .  Acute UTI 04/10/2019  . Seizure ([place]) 12/15/2018  . CAP (community acquired pneumonia) 12/14/2018  . Dementia ([place]) 10/20/2018  . Enlarged prostate without lower urinary tract symptoms ([place]) 06/30/2018  . Hyperprolactinemia ([place])  11/20/2017  . HLD (hyperlipidemia) 09/02/2016  . Chronic kidney disease 03/19/2016  . Gynecomastia, male 04/25/2015  . Routine health maintenance 04/25/2015  . Erectile disorder due to medical condition in male patient 05/07/2014  . Hypertension 12/12/2011  . Prediabetes 12/12/2011  . Seizure disorder ([place]) 12/12/2011   PCP:  Velna Ochs, MD Pharmacy:   [facility] [address] [place] [address] [place] [address] [place] [address] Phone: [phone] Fax: [phone]     Social Determinants of Health (SDOH) Interventions    Readmission Risk Interventions No flowsheet data found.

## 2019-06-19 DIAGNOSIS — G9341 Metabolic encephalopathy: Secondary | ICD-10-CM

## 2019-06-19 LAB — BLOOD GAS, ARTERIAL
Acid-Base Excess: 7.6 mmol/L — ABNORMAL HIGH (ref 0.0–2.0)
Bicarbonate: 30.7 mmol/L — ABNORMAL HIGH (ref 20.0–28.0)
FIO2: 21
O2 Saturation: 90.5 %
Patient temperature: 37
pCO2 arterial: 43.4 mmHg (ref 32.0–48.0)
pH, Arterial: 7.474 — ABNORMAL HIGH (ref 7.350–7.450)
pO2, Arterial: 60.7 mmHg — ABNORMAL LOW (ref 83.0–108.0)

## 2019-06-19 LAB — AMMONIA: Ammonia: 30 umol/L (ref 9–35)

## 2019-06-19 LAB — CBC
HCT: 34 % — ABNORMAL LOW (ref 39.0–52.0)
Hemoglobin: 11.3 g/dL — ABNORMAL LOW (ref 13.0–17.0)
MCH: 31.3 pg (ref 26.0–34.0)
MCHC: 33.2 g/dL (ref 30.0–36.0)
MCV: 94.2 fL (ref 80.0–100.0)
Platelets: 175 10*3/uL (ref 150–400)
RBC: 3.61 MIL/uL — ABNORMAL LOW (ref 4.22–5.81)
RDW: 11.6 % (ref 11.5–15.5)
WBC: 10.8 10*3/uL — ABNORMAL HIGH (ref 4.0–10.5)
nRBC: 0 % (ref 0.0–0.2)

## 2019-06-19 LAB — BASIC METABOLIC PANEL
Anion gap: 9 (ref 5–15)
BUN: 15 mg/dL (ref 8–23)
CO2: 27 mmol/L (ref 22–32)
Calcium: 8.6 mg/dL — ABNORMAL LOW (ref 8.9–10.3)
Chloride: 103 mmol/L (ref 98–111)
Creatinine, Ser: 1.32 mg/dL — ABNORMAL HIGH (ref 0.61–1.24)
GFR calc Af Amer: >60 mL/min (ref >60)
GFR calc non Af Amer: 55 mL/min — ABNORMAL LOW (ref >60)
Glucose, Bld: 126 mg/dL — ABNORMAL HIGH (ref 70–99)
Potassium: 3.9 mmol/L (ref 3.5–5.1)
Sodium: 139 mmol/L (ref 135–145)

## 2019-06-19 LAB — URINALYSIS, COMPLETE (UACMP) WITH MICROSCOPIC
Bilirubin Urine: NEGATIVE
Glucose, UA: NEGATIVE mg/dL
Ketones, ur: NEGATIVE mg/dL
Nitrite: NEGATIVE
Protein, ur: 100 mg/dL — AB
Specific Gravity, Urine: 1.016 (ref 1.005–1.030)
WBC, UA: >50 WBC/hpf — ABNORMAL HIGH (ref 0–5)
pH: 6 (ref 5.0–8.0)

## 2019-06-19 LAB — SARS CORONAVIRUS 2 (TAT 6-24 HRS): SARS Coronavirus 2: NEGATIVE

## 2019-06-19 LAB — VALPROIC ACID LEVEL: Valproic Acid Lvl: 48 ug/mL — ABNORMAL LOW (ref 50.0–100.0)

## 2019-06-19 LAB — PROCALCITONIN: Procalcitonin: 0.21 ng/mL

## 2019-06-19 LAB — MAGNESIUM: Magnesium: 1.8 mg/dL (ref 1.7–2.4)

## 2019-06-19 NOTE — TOC Progression Note (Signed)
Transition of Care [facility]) - Progression Note    Patient Details  Name: Tony [name] MRN: [id number] Date of Birth: 1951/02/14  Transition of Care [facility]) CM/SW Contact  Ihor Gully, LCSW Phone Number: 06/19/2019, 3:35 PM  Clinical Narrative:    [name] will accept patient on Monday pending updated negative Covid test.    Expected Discharge Plan: [place] Barriers to Discharge: Continued Medical Work up  Expected Discharge Plan and Services Expected Discharge Plan: [place] Acute Care Choice: [place]                                         Social Determinants of Health (SDOH) Interventions    Readmission Risk Interventions No flowsheet data found.

## 2019-06-19 NOTE — Progress Notes (Signed)
PROGRESS NOTE  Tony [name] [id number] DOB: June 22, 1951 DOA: 06/12/2019 PCP: Tony Ochs, MD  Brief History: 68 year old male with a history of Alzheimer's dementia, CKD stage III, diabetes mellitus, hypertension, seizure disorder presenting with subjective fevers and chills and generalized malaise. The patient was recently hospitalized from9/05/2019 to 04/15/2019 withAMSsecondary to UTI. Urine cultures grew Tony andProvidencia.The patient was discharged home with levofloxacin. The patient was discharged home with an indwelling Foley catheter. The patient failed a voiding trial in the urology office. He subsequently underwent another voiding trial on 05/22/2019 cystoscopy. He is tentatively scheduled for TURP surgery on 07/10/2019.Urology was consulted and recommended catheter change during this hospitalization. Foley catheter was changed on 06/13/2019.  Assessment/Plan: Sepsis -Present on admission -Secondary toCAUTI -Sepsis physiology resolved -WBC improved and fever trending down -Lactic acid peaked 2.8  CAUTI -Initially on cefepime 06/12/2019>>06/16/2019 -Urine culture growing Tony [name] and Tony [name] -Continue ampicillin and Cefdinir\  Somnolence -11/20-pt is more somnolent but arouses and answers questions -repeat UA -ABG -ammonia -VPA level -personally reviewed CXR--no consolidation, ?RLL opacity  Acute on chronic renal failure--CKD stage III -Secondary to sepsis and volume depletion -Baseline creatinine 1.5-1.8 -Serum creatinine peaked 2.98 -Continue IV fluids-->improved  Hypernatremia -Discontinue D5 half-normal saline -Continue D5W at100cc/h-->improved -remained stable after stopping D5W  Essential hypertension -Continue amlodipine  Seizure disorder -Continue Tony [name]  Alzheimer's dementia -Continue Tony [name]  BPH -continue Tony [name].  stage II pressure injury -present on admission  -frequent repositioning -preventive measures.   coagulase-negative bacteremia -represents contaminant  Hypokalemia -replete -check mag--1.8       Disposition Plan: SNF 11/20 if bed available Family Communication:Brother updated on 11/19  Consultants:urology  Code Status: FULL  DVT Prophylaxis: Tony [name]    Procedures: As Listed in Progress Note Above  Antibiotics: Cefepime 11/13>>11/16 Amoxil 11/17>>> Cefdinir 11/17>>      Subjective: Pt is sleepy but arouses easily and answers question appropriately albeit slow.  Denies headache, cp ,sob, abd pain, vomiting.  Nursing reports pt has been a little somnolent all day.  Objective: Vitals:   06/18/19 0446 06/18/19 2125 06/19/19 0638 06/19/19 1317  BP: 128/62 (!) 157/64 139/64 132/72  Pulse: 66 79 78 88  Resp: 17 17 18 16   Temp: 98.1 F (36.7 C)   100.1 F (37.8 C)  TempSrc: Oral   Oral  SpO2: 96% 94% 94% 93%  Weight:      Height:        Intake/Output Summary (Last 24 hours) at 06/19/2019 1447 Last data filed at 06/19/2019 0000 Gross per 24 hour  Intake 720 ml  Output 600 ml  Net 120 ml   Weight change:  Exam:   General:  Pt is alert, follows commands appropriately, not in acute distress  HEENT: No icterus, No thrush, No neck mass, [place]/AT  Cardiovascular: RRR, S1/S2, no rubs, no gallops  Respiratory: bibasilar crackles, no wheeze  Abdomen: Soft/+BS, non tender, non distended, no guarding  Extremities: trace LE edema, No lymphangitis, No petechiae, No rashes, no synovitis   Data Reviewed: I have personally reviewed following labs and imaging studies Basic Metabolic Panel: Recent Labs  Lab 06/15/19 1200 06/16/19 0631 06/17/19 0825 06/18/19 0706 06/19/19 0521  NA 148* 151* 151* 144 139  K 4.9 4.2 3.9 3.4* 3.9  CL 110 117* 113* 106 103  CO2 26 27 28 29 27   GLUCOSE 109* 107* 119* 133* 126*  BUN 44* 34* 24* 18 15  CREATININE 1.90* 1.68* 1.49* 1.37* 1.32*  CALCIUM 9.2 8.9 9.2 8.9 8.6*  MG  --   --   --   --  1.8   Liver Function Tests: No results for input(s): AST, ALT, ALKPHOS, BILITOT, PROT, ALBUMIN in the last 168 hours. No results for input(s): LIPASE, AMYLASE in the last 168 hours. No results for input(s): AMMONIA in the last 168 hours. Coagulation Profile: No results for input(s): INR, PROTIME in the last 168 hours. CBC: Recent Labs  Lab 06/13/19 0754 06/17/19 0549 06/17/19 0825 06/18/19 0706  WBC 18.9* 9.6 9.5 9.1  HGB 15.1 11.2* 11.5* 11.3*  HCT 47.8 37.6* 37.2* 36.7*  MCV 99.8 104.2* 103.0* 101.9*  PLT 176 134* 139* 141*   Cardiac Enzymes: No results for input(s): CKTOTAL, CKMB, CKMBINDEX, TROPONINI in the last 168 hours. BNP: Invalid input(s): POCBNP CBG: No results for input(s): GLUCAP in the last 168 hours. HbA1C: No results for input(s): HGBA1C in the last 72 hours. Urine analysis:    Component Value Date/Time   COLORURINE YELLOW 06/12/2019 1045   APPEARANCEUR CLOUDY (A) 06/12/2019 1045   LABSPEC 1.015 06/12/2019 1045   PHURINE 5.0 06/12/2019 1045   GLUCOSEU NEGATIVE 06/12/2019 1045   HGBUR LARGE (A) 06/12/2019 1045   BILIRUBINUR NEGATIVE 06/12/2019 1045   KETONESUR NEGATIVE 06/12/2019 1045   PROTEINUR 100 (A) 06/12/2019 1045   UROBILINOGEN 0.2 02/07/2010 1547   NITRITE POSITIVE (A) 06/12/2019 1045   LEUKOCYTESUR LARGE (A) 06/12/2019 1045   Sepsis Labs: @LABRCNTIP (procalcitonin:4,lacticidven:4) ) Recent Results (from the past 240 hour(s))  SARS CORONAVIRUS 2 (Tony [name] 6-24 HRS) Nasopharyngeal Nasopharyngeal Swab     Status: None   Collection Time: 06/12/19 11:57 AM   Specimen: Nasopharyngeal Swab  Result Value Ref Range Status   SARS Coronavirus 2 NEGATIVE NEGATIVE Final    Comment: (NOTE) SARS-CoV-2 target nucleic acids are NOT DETECTED. The SARS-CoV-2 RNA is generally detectable in upper and lower respiratory specimens during the acute phase of infection. Negative results do not preclude SARS-CoV-2  infection, do not rule out co-infections with other pathogens, and should not be used as the sole basis for treatment or other patient management decisions. Negative results must be combined with clinical observations, patient history, and epidemiological information. The expected result is Negative. Fact Sheet for Patients: SugarRoll.be Fact Sheet for Healthcare Providers: https://www.woods-mathews.com/ This test is not yet approved or cleared by the [place] FDA and  has been authorized for detection and/or diagnosis of SARS-CoV-2 by FDA under an Emergency Use Authorization (EUA). This EUA will remain  in effect (meaning this test can be used) for the duration of the COVID-19 declaration under Section 56 4(b)(1) of the Act, 21 U.S.C. section 360bbb-3(b)(1), unless the authorization is terminated or revoked sooner. Performed at [facility], [place] [address] [place], [place] [address]   Culture, blood (Routine x 2)     Status: None   Collection Time: 06/12/19 12:02 PM   Specimen: BLOOD RIGHT HAND  Result Value Ref Range Status   Specimen Description BLOOD RIGHT HAND  Final   Special Requests   Final    BOTTLES DRAWN AEROBIC AND ANAEROBIC Blood Culture results may not be optimal due to an inadequate volume of blood received in culture bottles   Culture   Final    NO GROWTH 5 DAYS Performed at [facility], [address], [place],  [address]    Report Status 06/17/2019 FINAL  Final  Urine culture     Status: Abnormal   Collection Time: 06/12/19 12:07 PM  Specimen: In/Out Cath Urine  Result Value Ref Range Status   Specimen Description   Final    IN/OUT CATH URINE Performed at [facility], [address] [place], [place] [address]    Special Requests   Final    NONE Performed at [facility], [address], [place], [place] [address]    Culture (A)  Final    >=100,000 COLONIES/mL Tony [name] BRAAKII >=100,000  COLONIES/mL Tony [name]    Report Status 06/16/2019 FINAL  Final   Organism ID, Bacteria Tony [name] BRAAKII (A)  Final   Organism ID, Bacteria Tony [name] (A)  Final      Susceptibility   Tony [name] braakii - MIC*    CEFAZOLIN >=64 RESISTANT Resistant     CEFTRIAXONE <=1 SENSITIVE Sensitive     CIPROFLOXACIN <=0.25 SENSITIVE Sensitive     GENTAMICIN <=1 SENSITIVE Sensitive     IMIPENEM <=0.25 SENSITIVE Sensitive     NITROFURANTOIN <=16 SENSITIVE Sensitive     TRIMETH/SULFA <=20 SENSITIVE Sensitive     PIP/TAZO <=4 SENSITIVE Sensitive     * >=100,000 COLONIES/mL Tony [name] BRAAKII   Tony [name] - MIC*    AMPICILLIN <=2 SENSITIVE Sensitive     LEVOFLOXACIN 0.5 SENSITIVE Sensitive     NITROFURANTOIN <=16 SENSITIVE Sensitive     VANCOMYCIN 1 SENSITIVE Sensitive     * >=100,000 COLONIES/mL Tony [name]  Culture, blood (Routine x 2)     Status: Abnormal   Collection Time: 06/12/19 12:18 PM   Specimen: BLOOD RIGHT ARM  Result Value Ref Range Status   Specimen Description   Final    BLOOD RIGHT ARM Performed at [facility], [address], [place], [place] [address]    Special Requests   Final    BOTTLES DRAWN AEROBIC AND ANAEROBIC Blood Culture results may not be optimal due to an inadequate volume of blood received in culture bottles Performed at [facility], [address], [place], [place] [address]    Culture  Setup Time   Final    GRAM POSITIVE COCCI ANAEROBIC BOTTLE ONLY Gram Stain Report Called to,Read Back By and Verified With: JACKSON @ 1003 ON [id number] BY HENDERSON L. Performed at [facility], [address], [place], [place] [address]    Culture (A)  Final    STAPHYLOCOCCUS SPECIES (COAGULASE NEGATIVE) THE SIGNIFICANCE OF ISOLATING THIS ORGANISM FROM A SINGLE SET OF BLOOD CULTURES WHEN MULTIPLE SETS ARE DRAWN IS UNCERTAIN. PLEASE NOTIFY THE MICROBIOLOGY DEPARTMENT WITHIN ONE WEEK IF SPECIATION AND SENSITIVITIES ARE REQUIRED.  Performed at [facility], [place] [address], [place],  [address]    Report Status 06/15/2019 FINAL  Final     Scheduled Meds: . amLODipine  5 mg Oral Daily  . amoxicillin  500 mg Oral Q8H  . cefdinir  300 mg Oral Q12H  . Chlorhexidine Gluconate Cloth  6 each Topical Daily  . divalproex  1,500 mg Oral QHS  . Tony [name]  5 mg Oral QHS  . [name] injection (subcutaneous)  5,000 Units Subcutaneous Q8H  . rosuvastatin  5 mg Oral QHS  . tamsulosin  0.8 mg Oral QHS   Continuous Infusions:  Procedures/Studies: US Renal  Result Date: 06/14/2019 CLINICAL DATA:  Initial evaluation for acute renal insufficiency. EXAM: RENAL / URINARY TRACT ULTRASOUND COMPLETE COMPARISON:  None. FINDINGS: Right Kidney: Renal measurements: 12.3 x 4.5 x 4.7 cm = volume: 140 mL. Echogenicity within normal limits. No nephrolithiasis or hydronephrosis. 3.3 x 3.2 x 2.1 cm simple cyst present at the  interpolar region. 1.8 x 1.4 x 1.5 cm simple cyst present at the lower pole. Left Kidney: Renal measurements: 12.6 x 6.8 x 6.1 cm = volume: 273 mL. Echogenicity within normal limits. No nephrolithiasis or hydronephrosis. 2.7 x 2.2 x 2.3 cm simple cyst present at the lower pole. Bladder: Decompressed with a Foley catheter in place. Other: None. IMPRESSION: 1. No hydronephrosis or other acute finding. 2. Simple bilateral renal cysts as above. Electronically Signed   By: Jeannine Boga M.D.   On: 06/14/2019 14:22   Dg Chest Port 1 View  Result Date: 06/18/2019 CLINICAL DATA:  Dyspnea EXAM: PORTABLE CHEST 1 VIEW COMPARISON:  Chest radiograph dated 06/12/2019 FINDINGS: The heart size and mediastinal contours are within normal limits. Mild left basilar atelectasis/airspace disease has developed since prior exam. A small left pleural effusion may contribute. There is mild right basilar airspace disease. There is no right pleural effusion or pneumothorax on either side. The visualized skeletal structures are unremarkable.  IMPRESSION: 1. Mild left basilar atelectasis/airspace disease and possible small left pleural effusion. 2. Mild right basilar airspace disease. Electronically Signed   By: Zerita Boers M.D.   On: 06/18/2019 19:38   Dg Chest Portable 1 View  Result Date: 06/12/2019 CLINICAL DATA:  Fever. EXAM: PORTABLE CHEST 1 VIEW COMPARISON:  April 10, 2019. FINDINGS: The heart size and mediastinal contours are within normal limits. Both lungs are clear. The visualized skeletal structures are unremarkable. IMPRESSION: No active disease. Electronically Signed   By: Marijo Conception M.D.   On: 06/12/2019 11:04    Orson Eva, DO  [facility] Pager [phone]  If 7PM-7AM, please contact night-coverage [email or web address] Password [email or web address] 06/19/2019, 2:47 PM   LOS: 6 days

## 2019-06-19 NOTE — Care Management Important Message (Signed)
Important Message  Patient Details  Name: Tony [name] MRN: [id number] Date of Birth: 08/12/1950   Medicare Important Message Given:  Yes     [name] 06/19/2019, 3:12 PM

## 2019-06-20 LAB — BASIC METABOLIC PANEL
Anion gap: 10 (ref 5–15)
BUN: 18 mg/dL (ref 8–23)
CO2: 31 mmol/L (ref 22–32)
Calcium: 9.2 mg/dL (ref 8.9–10.3)
Chloride: 100 mmol/L (ref 98–111)
Creatinine, Ser: 1.28 mg/dL — ABNORMAL HIGH (ref 0.61–1.24)
GFR calc Af Amer: >60 mL/min (ref >60)
GFR calc non Af Amer: 57 mL/min — ABNORMAL LOW (ref >60)
Glucose, Bld: 134 mg/dL — ABNORMAL HIGH (ref 70–99)
Potassium: 3.7 mmol/L (ref 3.5–5.1)
Sodium: 141 mmol/L (ref 135–145)

## 2019-06-20 LAB — URINE CULTURE: Culture: NO GROWTH

## 2019-06-20 LAB — CBC
HCT: 35.4 % — ABNORMAL LOW (ref 39.0–52.0)
Hemoglobin: 11.5 g/dL — ABNORMAL LOW (ref 13.0–17.0)
MCH: 31.3 pg (ref 26.0–34.0)
MCHC: 32.5 g/dL (ref 30.0–36.0)
MCV: 96.5 fL (ref 80.0–100.0)
Platelets: 200 10*3/uL (ref 150–400)
RBC: 3.67 MIL/uL — ABNORMAL LOW (ref 4.22–5.81)
RDW: 11.7 % (ref 11.5–15.5)
WBC: 9.4 10*3/uL (ref 4.0–10.5)
nRBC: 0 % (ref 0.0–0.2)

## 2019-06-20 LAB — MAGNESIUM: Magnesium: 1.9 mg/dL (ref 1.7–2.4)

## 2019-06-20 LAB — LACTIC ACID, PLASMA: Lactic Acid, Venous: 1.4 mmol/L (ref 0.5–1.9)

## 2019-06-20 LAB — MRSA PCR SCREENING: MRSA by PCR: NEGATIVE

## 2019-06-20 MED ORDER — NYSTATIN 100000 UNIT/GM EX POWD
CUTANEOUS | Status: AC | PRN
  Filled 2019-06-20: qty 15

## 2019-06-20 MED ORDER — PIPERACILLIN-TAZOBACTAM 3.375 G IVPB
3.3750 g | Freq: Once | INTRAVENOUS | Status: AC
  Administered 2019-06-20: 3.375 g via INTRAVENOUS
  Filled 2019-06-20: qty 50

## 2019-06-20 MED ORDER — PIPERACILLIN-TAZOBACTAM 3.375 G IVPB
3.3750 g | Freq: Three times a day (TID) | INTRAVENOUS | Status: DC
  Administered 2019-06-20 – 2019-06-26 (×17): 3.375 g via INTRAVENOUS
  Filled 2019-06-20 (×19): qty 50

## 2019-06-20 NOTE — Progress Notes (Signed)
PROGRESS NOTE  Tony [name] [id number] DOB: Nov 16, 1950 DOA: 06/12/2019 PCP: Velna Ochs, MD   Brief History: 68 year old male with a history of Alzheimer's dementia, CKD stage III, diabetes mellitus, hypertension, seizure disorder presenting with subjective fevers and chills and generalized malaise. The patient was recently hospitalized from9/05/2019 to 04/15/2019 withAMSsecondary to UTI. Urine cultures grew Enterococcus andProvidencia.The patient was discharged home with levofloxacin. The patient was discharged home with an indwelling Foley catheter. The patient failed a voiding trial in the urology office. He subsequently underwent another voiding trial on 05/22/2019 cystoscopy. He is tentatively scheduled for TURP surgery on 07/10/2019.Urology was consulted and recommended catheter change during this hospitalization. Foley catheter was changed on 06/13/2019.  The patient had made a gradual improvement until the evening of 06/19/2019.  He appeared more allergic at that time.  Repeat labs and chest x-ray were obtained and suggested new right lower lobe opacity.  Patient spiked a fever 1 to 2.6 F on the evening of 06/19/2019.  Repeat blood and urine cultures were obtained.  Assessment/Plan: Sepsis -Present on admission -Secondary toCAUTI -Sepsis physiology resolved -WBC improved and fever trending down -Lactic acid peaked 2.8 -06/19/19 evening--fever 102.6 -personally reviewed CXR--RLL opacity -06/19/19 UA with pyuria -06/20/19--broaden abx coverage--start zosyn  CAUTI -Initially on cefepime 06/12/2019>>06/16/2019 -Urine culture growing Enterococcus faecalis and Citrobacter -ampicillin and Cefdinir>>>zosyn  HCAP -start zosyn -MRSA screen  Somnolence -11/20-pt is more somnolent but arouses and answers questions -repeat UA -ABG--7.4 74/43/60/30 RA -ammonia--30 -VPA level--48 -personally reviewed CXR--no consolidation, ?RLL opacity  Acute  on chronic renal failure--CKD stage III -Secondary to sepsis and volume depletion -Baseline creatinine 1.5-1.8 -Serum creatinine peaked 2.98 -Continue IV fluids-->improved  Hypernatremia -Discontinue D5 half-normal saline -ContinueD5W at100cc/h-->improved -remained stable after stopping D5W  Essential hypertension -Continue amlodipine  Seizure disorder -Continue Depakote  Alzheimer's dementia -Continue donepezil  BPH -continue flomax.  stage II pressure injury -present on admission -frequent repositioning -preventive measures.   coagulase-negative bacteremia -represents contaminant  Hypokalemia -replete -check mag--1.8       Disposition Plan:SNF 11/23 if stable Family Communication:Brother updated on 11/21  Consultants:urology  Code Status: FULL  DVT Prophylaxis: [place] Heparin    Procedures: As Listed in Progress Note Above  Antibiotics: Cefepime 11/13>>11/16 Amoxil 11/17>>> Cefdinir 11/17>>     Subjective: Patient remains pleasantly confused but more alert than yesterday.  He complains of some shortness of breath and abdominal pain.  He denies any vomiting, diarrhea.  Remainder review of systems unobtainable secondary to patient's confusion.  Objective: Vitals:   06/19/19 2000 06/19/19 2117 06/19/19 2245 06/20/19 0451  BP:  (!) 148/84  130/72  Pulse:  88  71  Resp:  17    Temp:  (!) 101.6 F (38.7 C) 98.1 F (36.7 C) 98.2 F (36.8 C)  TempSrc:  Oral Oral Oral  SpO2: 97% 95%  100%  Weight:      Height:        Intake/Output Summary (Last 24 hours) at 06/20/2019 1109 Last data filed at 06/20/2019 0500 Gross per 24 hour  Intake 60 ml  Output 1050 ml  Net -990 ml   Weight change:  Exam:   General:  Pt is alert, follows commands appropriately, not in acute distress; pleasantly confused  HEENT: No icterus, No thrush, No neck mass, [place]/AT  Cardiovascular: RRR, S1/S2, no rubs, no gallops   Respiratory: Bibasilar rales but no wheezing.  Good air movement.  Abdomen: Soft/+BS, non tender, non distended, no  guarding  Extremities: No edema, No lymphangitis, No petechiae, No rashes, no synovitis   Data Reviewed: I have personally reviewed following labs and imaging studies Basic Metabolic Panel: Recent Labs  Lab 06/16/19 0631 06/17/19 0825 06/18/19 0706 06/19/19 0521 06/20/19 0924  NA 151* 151* 144 139 141  K 4.2 3.9 3.4* 3.9 3.7  CL 117* 113* 106 103 100  CO2 27 28 29 27 31   GLUCOSE 107* 119* 133* 126* 134*  BUN 34* 24* 18 15 18   CREATININE 1.68* 1.49* 1.37* 1.32* 1.28*  CALCIUM 8.9 9.2 8.9 8.6* 9.2  MG  --   --   --  1.8 1.9   Liver Function Tests: No results for input(s): AST, ALT, ALKPHOS, BILITOT, PROT, ALBUMIN in the last 168 hours. No results for input(s): LIPASE, AMYLASE in the last 168 hours. Recent Labs  Lab 06/19/19 1937  AMMONIA 30   Coagulation Profile: No results for input(s): INR, PROTIME in the last 168 hours. CBC: Recent Labs  Lab 06/17/19 0549 06/17/19 0825 06/18/19 0706 06/19/19 1937 06/20/19 0924  WBC 9.6 9.5 9.1 10.8* 9.4  HGB 11.2* 11.5* 11.3* 11.3* 11.5*  HCT 37.6* 37.2* 36.7* 34.0* 35.4*  MCV 104.2* 103.0* 101.9* 94.2 96.5  PLT 134* 139* 141* 175 200   Cardiac Enzymes: No results for input(s): CKTOTAL, CKMB, CKMBINDEX, TROPONINI in the last 168 hours. BNP: Invalid input(s): POCBNP CBG: No results for input(s): GLUCAP in the last 168 hours. HbA1C: No results for input(s): HGBA1C in the last 72 hours. Urine analysis:    Component Value Date/Time   COLORURINE YELLOW 06/19/2019 1524   APPEARANCEUR HAZY (A) 06/19/2019 1524   LABSPEC 1.016 06/19/2019 1524   PHURINE 6.0 06/19/2019 1524   GLUCOSEU NEGATIVE 06/19/2019 1524   HGBUR SMALL (A) 06/19/2019 1524   BILIRUBINUR NEGATIVE 06/19/2019 1524   KETONESUR NEGATIVE 06/19/2019 1524   PROTEINUR 100 (A) 06/19/2019 1524   UROBILINOGEN 0.2 02/07/2010 1547   NITRITE NEGATIVE  06/19/2019 1524   LEUKOCYTESUR SMALL (A) 06/19/2019 1524   Sepsis Labs: @LABRCNTIP (procalcitonin:4,lacticidven:4) ) Recent Results (from the past 240 hour(s))  SARS CORONAVIRUS 2 (Fintan Grater 6-24 HRS) Nasopharyngeal Nasopharyngeal Swab     Status: None   Collection Time: 06/12/19 11:57 AM   Specimen: Nasopharyngeal Swab  Result Value Ref Range Status   SARS Coronavirus 2 NEGATIVE NEGATIVE Final    Comment: (NOTE) SARS-CoV-2 target nucleic acids are NOT DETECTED. The SARS-CoV-2 RNA is generally detectable in upper and lower respiratory specimens during the acute phase of infection. Negative results do not preclude SARS-CoV-2 infection, do not rule out co-infections with other pathogens, and should not be used as the sole basis for treatment or other patient management decisions. Negative results must be combined with clinical observations, patient history, and epidemiological information. The expected result is Negative. Fact Sheet for Patients: SugarRoll.be Fact Sheet for Healthcare Providers: https://www.woods-mathews.com/ This test is not yet approved or cleared by the [place] FDA and  has been authorized for detection and/or diagnosis of SARS-CoV-2 by FDA under an Emergency Use Authorization (EUA). This EUA will remain  in effect (meaning this test can be used) for the duration of the COVID-19 declaration under Section 56 4(b)(1) of the Act, 21 U.S.C. section 360bbb-3(b)(1), unless the authorization is terminated or revoked sooner. Performed at [facility], [place] [address] [place], [place] [address]   Culture, blood (Routine x 2)     Status: None   Collection Time: 06/12/19 12:02 PM   Specimen: BLOOD RIGHT HAND  Result Value Ref Range Status   Specimen Description BLOOD RIGHT HAND  Final   Special Requests   Final    BOTTLES DRAWN AEROBIC AND ANAEROBIC Blood Culture results may not be optimal due to an inadequate volume of  blood received in culture bottles   Culture   Final    NO GROWTH 5 DAYS Performed at [facility], [address], [place], [place] [address]    Report Status 06/17/2019 FINAL  Final  Urine culture     Status: Abnormal   Collection Time: 06/12/19 12:07 PM   Specimen: In/Out Cath Urine  Result Value Ref Range Status   Specimen Description   Final    IN/OUT CATH URINE Performed at [facility], [address] [place], [place] [address]    Special Requests   Final    NONE Performed at [facility], [address] [place], [place] [address]    Culture (A)  Final    >=100,000 COLONIES/mL CITROBACTER BRAAKII >=100,000 COLONIES/mL ENTEROCOCCUS FAECALIS    Report Status 06/16/2019 FINAL  Final   Organism ID, Bacteria CITROBACTER BRAAKII (A)  Final   Organism ID, Bacteria ENTEROCOCCUS FAECALIS (A)  Final      Susceptibility   Citrobacter braakii - MIC*    CEFAZOLIN >=64 RESISTANT Resistant     CEFTRIAXONE <=1 SENSITIVE Sensitive     CIPROFLOXACIN <=0.25 SENSITIVE Sensitive     GENTAMICIN <=1 SENSITIVE Sensitive     IMIPENEM <=0.25 SENSITIVE Sensitive     NITROFURANTOIN <=16 SENSITIVE Sensitive     TRIMETH/SULFA <=20 SENSITIVE Sensitive     PIP/TAZO <=4 SENSITIVE Sensitive     * >=100,000 COLONIES/mL CITROBACTER BRAAKII   Enterococcus faecalis - MIC*    AMPICILLIN <=2 SENSITIVE Sensitive     LEVOFLOXACIN 0.5 SENSITIVE Sensitive     NITROFURANTOIN <=16 SENSITIVE Sensitive     VANCOMYCIN 1 SENSITIVE Sensitive     * >=100,000 COLONIES/mL ENTEROCOCCUS FAECALIS  Culture, blood (Routine x 2)     Status: Abnormal   Collection Time: 06/12/19 12:18 PM   Specimen: BLOOD RIGHT ARM  Result Value Ref Range Status   Specimen Description   Final    BLOOD RIGHT ARM Performed at [facility], [address] [place], [place] [address]    Special Requests   Final    BOTTLES DRAWN AEROBIC AND ANAEROBIC Blood Culture results may not be optimal due to an inadequate volume of blood received in  culture bottles Performed at [facility], [address] [place], [place] [address]    Culture  Setup Time   Final    GRAM POSITIVE COCCI ANAEROBIC BOTTLE ONLY Gram Stain Report Called to,Read Back By and Verified With: JACKSON @ 1003 ON XO:[id number] BY HENDERSON L. Performed at [facility], [address], [place], [place] [address]    Culture (A)  Final    STAPHYLOCOCCUS SPECIES (COAGULASE NEGATIVE) THE SIGNIFICANCE OF ISOLATING THIS ORGANISM FROM A SINGLE SET OF BLOOD CULTURES WHEN MULTIPLE SETS ARE DRAWN IS UNCERTAIN. PLEASE NOTIFY THE MICROBIOLOGY DEPARTMENT WITHIN ONE WEEK IF SPECIATION AND SENSITIVITIES ARE REQUIRED. Performed at [facility], [place] [address], [place],  [address]    Report Status 06/15/2019 FINAL  Final  SARS CORONAVIRUS 2 (Ahmadou Bolz 6-24 HRS) Nasopharyngeal Nasopharyngeal Swab     Status: None   Collection Time: 06/19/19 11:59 AM   Specimen: Nasopharyngeal Swab  Result Value Ref Range Status   SARS Coronavirus 2 NEGATIVE NEGATIVE Final    Comment: (NOTE) SARS-CoV-2  target nucleic acids are NOT DETECTED. The SARS-CoV-2 RNA is generally detectable in upper and lower respiratory specimens during the acute phase of infection. Negative results do not preclude SARS-CoV-2 infection, do not rule out co-infections with other pathogens, and should not be used as the sole basis for treatment or other patient management decisions. Negative results must be combined with clinical observations, patient history, and epidemiological information. The expected result is Negative. Fact Sheet for Patients: SugarRoll.be Fact Sheet for Healthcare Providers: https://www.woods-mathews.com/ This test is not yet approved or cleared by the [place] FDA and  has been authorized for detection and/or diagnosis of SARS-CoV-2 by FDA under an Emergency Use Authorization (EUA). This EUA will remain  in effect (meaning this test can be used)  for the duration of the COVID-19 declaration under Section 56 4(b)(1) of the Act, 21 U.S.C. section 360bbb-3(b)(1), unless the authorization is terminated or revoked sooner. Performed at [facility], [place] [address] [place], [place] [address]      Scheduled Meds: . amLODipine  5 mg Oral Daily  . Chlorhexidine Gluconate Cloth  6 each Topical Daily  . divalproex  1,500 mg Oral QHS  . donepezil  5 mg Oral QHS  . heparin injection (subcutaneous)  5,000 Units Subcutaneous Q8H  . rosuvastatin  5 mg Oral QHS  . tamsulosin  0.8 mg Oral QHS   Continuous Infusions: . piperacillin-tazobactam (ZOSYN)  IV      Procedures/Studies: US Renal  Result Date: 06/14/2019 CLINICAL DATA:  Initial evaluation for acute renal insufficiency. EXAM: RENAL / URINARY TRACT ULTRASOUND COMPLETE COMPARISON:  None. FINDINGS: Right Kidney: Renal measurements: 12.3 x 4.5 x 4.7 cm = volume: 140 mL. Echogenicity within normal limits. No nephrolithiasis or hydronephrosis. 3.3 x 3.2 x 2.1 cm simple cyst present at the interpolar region. 1.8 x 1.4 x 1.5 cm simple cyst present at the lower pole. Left Kidney: Renal measurements: 12.6 x 6.8 x 6.1 cm = volume: 273 mL. Echogenicity within normal limits. No nephrolithiasis or hydronephrosis. 2.7 x 2.2 x 2.3 cm simple cyst present at the lower pole. Bladder: Decompressed with a Foley catheter in place. Other: None. IMPRESSION: 1. No hydronephrosis or other acute finding. 2. Simple bilateral renal cysts as above. Electronically Signed   By: Jeannine Boga M.D.   On: 06/14/2019 14:22   Dg Chest Port 1 View  Result Date: 06/18/2019 CLINICAL DATA:  Dyspnea EXAM: PORTABLE CHEST 1 VIEW COMPARISON:  Chest radiograph dated 06/12/2019 FINDINGS: The heart size and mediastinal contours are within normal limits. Mild left basilar atelectasis/airspace disease has developed since prior exam. A small left pleural effusion may contribute. There is mild right basilar airspace disease.  There is no right pleural effusion or pneumothorax on either side. The visualized skeletal structures are unremarkable. IMPRESSION: 1. Mild left basilar atelectasis/airspace disease and possible small left pleural effusion. 2. Mild right basilar airspace disease. Electronically Signed   By: Zerita Boers M.D.   On: 06/18/2019 19:38   Dg Chest Portable 1 View  Result Date: 06/12/2019 CLINICAL DATA:  Fever. EXAM: PORTABLE CHEST 1 VIEW COMPARISON:  April 10, 2019. FINDINGS: The heart size and mediastinal contours are within normal limits. Both lungs are clear. The visualized skeletal structures are unremarkable. IMPRESSION: No active disease. Electronically Signed   By: Marijo Conception M.D.   On: 06/12/2019 11:04    Orson Eva, DO  [facility] Pager [phone]  If 7PM-7AM, please contact night-coverage [email or web address] Password [email or web address] 06/20/2019, 11:09 AM  LOS: 7 days

## 2019-06-21 LAB — BASIC METABOLIC PANEL
Anion gap: 11 (ref 5–15)
BUN: 17 mg/dL (ref 8–23)
CO2: 28 mmol/L (ref 22–32)
Calcium: 8.9 mg/dL (ref 8.9–10.3)
Chloride: 100 mmol/L (ref 98–111)
Creatinine, Ser: 1.43 mg/dL — ABNORMAL HIGH (ref 0.61–1.24)
GFR calc Af Amer: 58 mL/min — ABNORMAL LOW (ref >60)
GFR calc non Af Amer: 50 mL/min — ABNORMAL LOW (ref >60)
Glucose, Bld: 154 mg/dL — ABNORMAL HIGH (ref 70–99)
Potassium: 3.7 mmol/L (ref 3.5–5.1)
Sodium: 139 mmol/L (ref 135–145)

## 2019-06-21 LAB — CBC
HCT: 34.9 % — ABNORMAL LOW (ref 39.0–52.0)
Hemoglobin: 11.5 g/dL — ABNORMAL LOW (ref 13.0–17.0)
MCH: 31.5 pg (ref 26.0–34.0)
MCHC: 33 g/dL (ref 30.0–36.0)
MCV: 95.6 fL (ref 80.0–100.0)
Platelets: 244 10*3/uL (ref 150–400)
RBC: 3.65 MIL/uL — ABNORMAL LOW (ref 4.22–5.81)
RDW: 11.8 % (ref 11.5–15.5)
WBC: 11.6 10*3/uL — ABNORMAL HIGH (ref 4.0–10.5)
nRBC: 0 % (ref 0.0–0.2)

## 2019-06-21 NOTE — Progress Notes (Signed)
PROGRESS NOTE  Tony [name] [id number] DOB: 06/26/1951 DOA: 06/12/2019 PCP: Velna Ochs, MD Brief History: 68 year old male with a history of Alzheimer's dementia, CKD stage III, diabetes mellitus, hypertension, seizure disorder presenting with subjective fevers and chills and generalized malaise. The patient was recently hospitalized from9/05/2019 to 04/15/2019 withAMSsecondary to UTI. Urine cultures grew Enterococcus andProvidencia.The patient was discharged home with levofloxacin. The patient was discharged home with an indwelling Foley catheter. The patient failed a voiding trial in the urology office. He subsequently underwent another voiding trial on 05/22/2019 and underwent cystoscopy. He is tentatively scheduled for TURP surgery on 07/10/2019.Urology was consulted and recommended catheter change during this hospitalization. Foley catheter was changed on 06/13/2019.  The patient had made a gradual improvement until the evening of 06/19/2019.  He appeared more allergic at that time.  Repeat labs and chest x-ray were obtained and suggested new right lower lobe opacity.  Patient spiked a fever 102.6 F on the evening of 06/19/2019.  Repeat blood and urine cultures were obtained.  Assessment/Plan: Sepsis -Present on admission -Secondary toCAUTI -Sepsis physiology resolved -WBC improved and fever resolved -Lactic acid peaked 2.8 -06/19/19 evening--fever 102.6 -personally reviewed CXR--RLL opacity -06/19/19 UA with pyuria, culture neg -06/20/19--broaden abx coverage--start zosyn  CAUTI -Initially on cefepime 06/12/2019>>06/16/2019 -Urine culture growing Enterococcus faecalis and Citrobacter -ampicillin and Cefdinir>>>zosyn  HCAP -neg zosyn -MRSA screen--neg  Somnolence -11/20-pt is more somnolent but arouses and answers questions -repeat UA -ABG--7.4 74/43/60/30 RA -ammonia--30 -VPA level--48 -personally reviewed CXR--no consolidation, ?RLL  opacity  Acute on chronic renal failure--CKD stage III -Secondary to sepsis and volume depletion -Baseline creatinine 1.5-1.8 -Serum creatinine peaked 2.98 -Continue IV fluids-->improved  Hypernatremia -Discontinue D5 half-normal saline -ContinueD5W at100cc/h-->improved -remained stable after stopping D5W  Essential hypertension -Continue amlodipine  Seizure disorder -Continue Depakote  Alzheimer's dementia -Continue donepezil -brother has noted functional and cognitive decline over last 6 months  BPH -continue flomax.  stage II pressure injury -present on admission -frequent repositioning -preventive measures.   coagulase-negative bacteremia -represents contaminant  Hypokalemia -replete -check mag--1.8       Disposition Plan:SNF 11/23 if stable Family Communication:Brother updated on 11/22  Consultants:urology  Code Status: FULL  DVT Prophylaxis: [address] Heparin    Procedures: As Listed in Progress Note Above  Antibiotics: Cefepime 11/13>>11/16 Amoxil 11/17>>>11/21 Cefdinir 11/17>>11/21 Zosyn 11/21>>>  Subjective: Pt is awake and alert.  Denies f/c, cp, sob, n/v/d, abd pain.  Objective: Vitals:   06/19/19 2245 06/20/19 0451 06/21/19 0600 06/21/19 1301  BP:  130/72 139/61 (!) 164/70  Pulse:  71 99 95  Resp:   16 17  Temp: 98.1 F (36.7 C) 98.2 F (36.8 C) 99.3 F (37.4 C) 98.3 F (36.8 C)  TempSrc: Oral Oral Oral Oral  SpO2:  100% 94% 96%  Weight:      Height:        Intake/Output Summary (Last 24 hours) at 06/21/2019 1522 Last data filed at 06/21/2019 1300 Gross per 24 hour  Intake 811.2 ml  Output 1600 ml  Net -788.8 ml   Weight change:  Exam:   General:  Pt is alert, follows commands appropriately, not in acute distress  HEENT: No icterus, No thrush, No neck mass, [place]/AT  Cardiovascular: RRR, S1/S2, no rubs, no gallops  Respiratory: bibasilar rales, R>L  Abdomen: Soft/+BS, non tender,  non distended, no guarding  Extremities: No edema, No lymphangitis, No petechiae, No rashes, no synovitis   Data Reviewed: I have personally  reviewed following labs and imaging studies Basic Metabolic Panel: Recent Labs  Lab 06/17/19 0825 06/18/19 0706 06/19/19 0521 06/20/19 0924 06/21/19 0743  NA 151* 144 139 141 139  K 3.9 3.4* 3.9 3.7 3.7  CL 113* 106 103 100 100  CO2 28 29 27 31 28   GLUCOSE 119* 133* 126* 134* 154*  BUN 24* 18 15 18 17   CREATININE 1.49* 1.37* 1.32* 1.28* 1.43*  CALCIUM 9.2 8.9 8.6* 9.2 8.9  MG  --   --  1.8 1.9  --    Liver Function Tests: No results for input(s): AST, ALT, ALKPHOS, BILITOT, PROT, ALBUMIN in the last 168 hours. No results for input(s): LIPASE, AMYLASE in the last 168 hours. Recent Labs  Lab 06/19/19 1937  AMMONIA 30   Coagulation Profile: No results for input(s): INR, PROTIME in the last 168 hours. CBC: Recent Labs  Lab 06/17/19 0825 06/18/19 0706 06/19/19 1937 06/20/19 0924 06/21/19 0743  WBC 9.5 9.1 10.8* 9.4 11.6*  HGB 11.5* 11.3* 11.3* 11.5* 11.5*  HCT 37.2* 36.7* 34.0* 35.4* 34.9*  MCV 103.0* 101.9* 94.2 96.5 95.6  PLT 139* 141* 175 200 244   Cardiac Enzymes: No results for input(s): CKTOTAL, CKMB, CKMBINDEX, TROPONINI in the last 168 hours. BNP: Invalid input(s): POCBNP CBG: No results for input(s): GLUCAP in the last 168 hours. HbA1C: No results for input(s): HGBA1C in the last 72 hours. Urine analysis:    Component Value Date/Time   COLORURINE YELLOW 06/19/2019 1524   APPEARANCEUR HAZY (A) 06/19/2019 1524   LABSPEC 1.016 06/19/2019 1524   PHURINE 6.0 06/19/2019 1524   GLUCOSEU NEGATIVE 06/19/2019 1524   HGBUR SMALL (A) 06/19/2019 1524   BILIRUBINUR NEGATIVE 06/19/2019 1524   KETONESUR NEGATIVE 06/19/2019 1524   PROTEINUR 100 (A) 06/19/2019 1524   UROBILINOGEN 0.2 02/07/2010 1547   NITRITE NEGATIVE 06/19/2019 1524   LEUKOCYTESUR SMALL (A) 06/19/2019 1524   Sepsis Labs:  @LABRCNTIP (procalcitonin:4,lacticidven:4) ) Recent Results (from the past 240 hour(s))  SARS CORONAVIRUS 2 (Everest Brod 6-24 HRS) Nasopharyngeal Nasopharyngeal Swab     Status: None   Collection Time: 06/12/19 11:57 AM   Specimen: Nasopharyngeal Swab  Result Value Ref Range Status   SARS Coronavirus 2 NEGATIVE NEGATIVE Final    Comment: (NOTE) SARS-CoV-2 target nucleic acids are NOT DETECTED. The SARS-CoV-2 RNA is generally detectable in upper and lower respiratory specimens during the acute phase of infection. Negative results do not preclude SARS-CoV-2 infection, do not rule out co-infections with other pathogens, and should not be used as the sole basis for treatment or other patient management decisions. Negative results must be combined with clinical observations, patient history, and epidemiological information. The expected result is Negative. Fact Sheet for Patients: SugarRoll.be Fact Sheet for Healthcare Providers: https://www.woods-mathews.com/ This test is not yet approved or cleared by the [place] FDA and  has been authorized for detection and/or diagnosis of SARS-CoV-2 by FDA under an Emergency Use Authorization (EUA). This EUA will remain  in effect (meaning this test can be used) for the duration of the COVID-19 declaration under Section 56 4(b)(1) of the Act, 21 U.S.C. section 360bbb-3(b)(1), unless the authorization is terminated or revoked sooner. Performed at [facility], [place] [address] [place],  [address]   Culture, blood (Routine x 2)     Status: None   Collection Time: 06/12/19 12:02 PM   Specimen: BLOOD RIGHT HAND  Result Value Ref Range Status   Specimen Description BLOOD RIGHT HAND  Final   Special Requests  Final    BOTTLES DRAWN AEROBIC AND ANAEROBIC Blood Culture results may not be optimal due to an inadequate volume of blood received in culture bottles   Culture   Final    NO GROWTH 5 DAYS  Performed at [facility], [address] [place], [place] [address]    Report Status 06/17/2019 FINAL  Final  Urine culture     Status: Abnormal   Collection Time: 06/12/19 12:07 PM   Specimen: In/Out Cath Urine  Result Value Ref Range Status   Specimen Description   Final    IN/OUT CATH URINE Performed at [facility], [address], [place], [place] [address]    Special Requests   Final    NONE Performed at [facility], [address], [place], [place] [address]    Culture (A)  Final    >=100,000 COLONIES/mL CITROBACTER BRAAKII >=100,000 COLONIES/mL ENTEROCOCCUS FAECALIS    Report Status 06/16/2019 FINAL  Final   Organism ID, Bacteria CITROBACTER BRAAKII (A)  Final   Organism ID, Bacteria ENTEROCOCCUS FAECALIS (A)  Final      Susceptibility   Citrobacter braakii - MIC*    CEFAZOLIN >=64 RESISTANT Resistant     CEFTRIAXONE <=1 SENSITIVE Sensitive     CIPROFLOXACIN <=0.25 SENSITIVE Sensitive     GENTAMICIN <=1 SENSITIVE Sensitive     IMIPENEM <=0.25 SENSITIVE Sensitive     NITROFURANTOIN <=16 SENSITIVE Sensitive     TRIMETH/SULFA <=20 SENSITIVE Sensitive     PIP/TAZO <=4 SENSITIVE Sensitive     * >=100,000 COLONIES/mL CITROBACTER BRAAKII   Enterococcus faecalis - MIC*    AMPICILLIN <=2 SENSITIVE Sensitive     LEVOFLOXACIN 0.5 SENSITIVE Sensitive     NITROFURANTOIN <=16 SENSITIVE Sensitive     VANCOMYCIN 1 SENSITIVE Sensitive     * >=100,000 COLONIES/mL ENTEROCOCCUS FAECALIS  Culture, blood (Routine x 2)     Status: Abnormal   Collection Time: 06/12/19 12:18 PM   Specimen: BLOOD RIGHT ARM  Result Value Ref Range Status   Specimen Description   Final    BLOOD RIGHT ARM Performed at [facility], [address], [place], [place] [address]    Special Requests   Final    BOTTLES DRAWN AEROBIC AND ANAEROBIC Blood Culture results may not be optimal due to an inadequate volume of blood received in culture bottles Performed at [facility], [address] [place], [place]  [address]    Culture  Setup Time   Final    GRAM POSITIVE COCCI ANAEROBIC BOTTLE ONLY Gram Stain Report Called to,Read Back By and Verified With: JACKSON @ 1003 ON [id number] BY HENDERSON L. Performed at [facility], [address], [place], [place] [address]    Culture (A)  Final    STAPHYLOCOCCUS SPECIES (COAGULASE NEGATIVE) THE SIGNIFICANCE OF ISOLATING THIS ORGANISM FROM A SINGLE SET OF BLOOD CULTURES WHEN MULTIPLE SETS ARE DRAWN IS UNCERTAIN. PLEASE NOTIFY THE MICROBIOLOGY DEPARTMENT WITHIN ONE WEEK IF SPECIATION AND SENSITIVITIES ARE REQUIRED. Performed at [facility], [place] [address], [place], [place] [address]    Report Status 06/15/2019 FINAL  Final  SARS CORONAVIRUS 2 (Jeily Guthridge 6-24 HRS) Nasopharyngeal Nasopharyngeal Swab     Status: None   Collection Time: 06/19/19 11:59 AM   Specimen: Nasopharyngeal Swab  Result Value Ref Range Status   SARS Coronavirus 2 NEGATIVE NEGATIVE Final    Comment: (NOTE) SARS-CoV-2 target nucleic acids are NOT DETECTED. The SARS-CoV-2 RNA is generally detectable in upper and lower respiratory specimens during the  acute phase of infection. Negative results do not preclude SARS-CoV-2 infection, do not rule out co-infections with other pathogens, and should not be used as the sole basis for treatment or other patient management decisions. Negative results must be combined with clinical observations, patient history, and epidemiological information. The expected result is Negative. Fact Sheet for Patients: SugarRoll.be Fact Sheet for Healthcare Providers: https://www.woods-mathews.com/ This test is not yet approved or cleared by the [place] FDA and  has been authorized for detection and/or diagnosis of SARS-CoV-2 by FDA under an Emergency Use Authorization (EUA). This EUA will remain  in effect (meaning this test can be used) for the duration of the COVID-19 declaration under Section 56 4(b)(1) of the  Act, 21 U.S.C. section 360bbb-3(b)(1), unless the authorization is terminated or revoked sooner. Performed at [facility], [place] [address] [place], [place] [address]   Culture, Urine     Status: None   Collection Time: 06/19/19  3:24 PM   Specimen: Urine, Clean Catch  Result Value Ref Range Status   Specimen Description   Final    URINE, CLEAN CATCH Performed at [facility], [address] [place], [place] [address]    Special Requests   Final    NONE Performed at [name], [address], [place], [place] [address]    Culture   Final    NO GROWTH Performed at [facility], [place] [address] [place], [place] [address]    Report Status 06/20/2019 FINAL  Final  Culture, blood (routine x 2)     Status: None (Preliminary result)   Collection Time: 06/20/19 12:48 PM   Specimen: Right Antecubital; Blood  Result Value Ref Range Status   Specimen Description   Final    RIGHT ANTECUBITAL BOTTLES DRAWN AEROBIC AND ANAEROBIC   Special Requests   Final    Blood Culture adequate volume Performed at [facility], [address], [place], [place] [address]    Culture PENDING  Incomplete   Report Status PENDING  Incomplete  Culture, blood (routine x 2)     Status: None (Preliminary result)   Collection Time: 06/20/19 12:56 PM   Specimen: BLOOD LEFT WRIST  Result Value Ref Range Status   Specimen Description BLOOD LEFT WRIST BOTTLES DRAWN AEROBIC ONLY  Final   Special Requests   Final    Blood Culture adequate volume Performed at [facility], [address], [place], [place] [address]    Culture PENDING  Incomplete   Report Status PENDING  Incomplete  MRSA PCR Screening     Status: None   Collection Time: 06/20/19  4:05 PM   Specimen: Nasal Mucosa; Nasopharyngeal  Result Value Ref Range Status   MRSA by PCR NEGATIVE NEGATIVE Final    Comment:        The GeneXpert MRSA Assay (FDA approved for NASAL specimens only), is one component of a comprehensive MRSA  colonization surveillance program. It is not intended to diagnose MRSA infection nor to guide or monitor treatment for MRSA infections. Performed at [facility], [address], [place],  [address]      Scheduled Meds: . amLODipine  5 mg Oral Daily  . Chlorhexidine Gluconate Cloth  6 each Topical Daily  . divalproex  1,500 mg Oral QHS  . donepezil  5 mg Oral QHS  . heparin injection (subcutaneous)  5,000 Units Subcutaneous Q8H  . rosuvastatin  5 mg Oral QHS  . tamsulosin  0.8 mg Oral QHS  Continuous Infusions: . piperacillin-tazobactam (ZOSYN)  IV 3.375 g (06/21/19 0501)    Procedures/Studies: US Renal  Result Date: 06/14/2019 CLINICAL DATA:  Initial evaluation for acute renal insufficiency. EXAM: RENAL / URINARY TRACT ULTRASOUND COMPLETE COMPARISON:  None. FINDINGS: Right Kidney: Renal measurements: 12.3 x 4.5 x 4.7 cm = volume: 140 mL. Echogenicity within normal limits. No nephrolithiasis or hydronephrosis. 3.3 x 3.2 x 2.1 cm simple cyst present at the interpolar region. 1.8 x 1.4 x 1.5 cm simple cyst present at the lower pole. Left Kidney: Renal measurements: 12.6 x 6.8 x 6.1 cm = volume: 273 mL. Echogenicity within normal limits. No nephrolithiasis or hydronephrosis. 2.7 x 2.2 x 2.3 cm simple cyst present at the lower pole. Bladder: Decompressed with a Foley catheter in place. Other: None. IMPRESSION: 1. No hydronephrosis or other acute finding. 2. Simple bilateral renal cysts as above. Electronically Signed   By: Jeannine Boga M.D.   On: 06/14/2019 14:22   Dg Chest Port 1 View  Result Date: 06/18/2019 CLINICAL DATA:  Dyspnea EXAM: PORTABLE CHEST 1 VIEW COMPARISON:  Chest radiograph dated 06/12/2019 FINDINGS: The heart size and mediastinal contours are within normal limits. Mild left basilar atelectasis/airspace disease has developed since prior exam. A small left pleural effusion may contribute. There is mild right basilar airspace disease. There is no right  pleural effusion or pneumothorax on either side. The visualized skeletal structures are unremarkable. IMPRESSION: 1. Mild left basilar atelectasis/airspace disease and possible small left pleural effusion. 2. Mild right basilar airspace disease. Electronically Signed   By: Zerita Boers M.D.   On: 06/18/2019 19:38   Dg Chest Portable 1 View  Result Date: 06/12/2019 CLINICAL DATA:  Fever. EXAM: PORTABLE CHEST 1 VIEW COMPARISON:  April 10, 2019. FINDINGS: The heart size and mediastinal contours are within normal limits. Both lungs are clear. The visualized skeletal structures are unremarkable. IMPRESSION: No active disease. Electronically Signed   By: Marijo Conception M.D.   On: 06/12/2019 11:04    Orson Eva, DO  [facility] Pager [phone]  If 7PM-7AM, please contact night-coverage [email or web address] Password [email or web address] 06/21/2019, 3:22 PM   LOS: 8 days

## 2019-06-22 ENCOUNTER — Inpatient Hospital Stay (HOSPITAL_COMMUNITY): Payer: Medicare (Managed Care)

## 2019-06-22 DIAGNOSIS — Z515 Encounter for palliative care: Secondary | ICD-10-CM

## 2019-06-22 DIAGNOSIS — Z7189 Other specified counseling: Secondary | ICD-10-CM

## 2019-06-22 LAB — MAGNESIUM: Magnesium: 2.1 mg/dL (ref 1.7–2.4)

## 2019-06-22 LAB — URINALYSIS, COMPLETE (UACMP) WITH MICROSCOPIC
Bilirubin Urine: NEGATIVE
Glucose, UA: NEGATIVE mg/dL
Ketones, ur: NEGATIVE mg/dL
Nitrite: NEGATIVE
Protein, ur: 100 mg/dL — AB
Specific Gravity, Urine: 1.02 (ref 1.005–1.030)
WBC, UA: >50 WBC/hpf — ABNORMAL HIGH (ref 0–5)
pH: 5 (ref 5.0–8.0)

## 2019-06-22 LAB — LACTIC ACID, PLASMA: Lactic Acid, Venous: 1.8 mmol/L (ref 0.5–1.9)

## 2019-06-22 LAB — BASIC METABOLIC PANEL
Anion gap: 11 (ref 5–15)
BUN: 17 mg/dL (ref 8–23)
CO2: 31 mmol/L (ref 22–32)
Calcium: 8.8 mg/dL — ABNORMAL LOW (ref 8.9–10.3)
Chloride: 99 mmol/L (ref 98–111)
Creatinine, Ser: 1.53 mg/dL — ABNORMAL HIGH (ref 0.61–1.24)
GFR calc Af Amer: 53 mL/min — ABNORMAL LOW (ref >60)
GFR calc non Af Amer: 46 mL/min — ABNORMAL LOW (ref >60)
Glucose, Bld: 127 mg/dL — ABNORMAL HIGH (ref 70–99)
Potassium: 3.9 mmol/L (ref 3.5–5.1)
Sodium: 141 mmol/L (ref 135–145)

## 2019-06-22 LAB — CBC
HCT: 39 % (ref 39.0–52.0)
Hemoglobin: 12.3 g/dL — ABNORMAL LOW (ref 13.0–17.0)
MCH: 31.3 pg (ref 26.0–34.0)
MCHC: 31.5 g/dL (ref 30.0–36.0)
MCV: 99.2 fL (ref 80.0–100.0)
Platelets: 310 10*3/uL (ref 150–400)
RBC: 3.93 MIL/uL — ABNORMAL LOW (ref 4.22–5.81)
RDW: 12 % (ref 11.5–15.5)
WBC: 15 10*3/uL — ABNORMAL HIGH (ref 4.0–10.5)
nRBC: 0 % (ref 0.0–0.2)

## 2019-06-22 LAB — PROCALCITONIN: Procalcitonin: 0.24 ng/mL

## 2019-06-22 LAB — SARS CORONAVIRUS 2 (TAT 6-24 HRS): SARS Coronavirus 2: NEGATIVE

## 2019-06-22 LAB — URIC ACID: Uric Acid, Serum: 4.3 mg/dL (ref 3.7–8.6)

## 2019-06-22 MED ORDER — IOHEXOL 9 MG/ML PO SOLN
ORAL | Status: AC
  Filled 2019-06-22: qty 1000

## 2019-06-22 MED ORDER — SODIUM CHLORIDE 0.9 % IV BOLUS
500.0000 mL | Freq: Once | INTRAVENOUS | Status: AC
  Administered 2019-06-22: 500 mL via INTRAVENOUS

## 2019-06-22 MED ORDER — KCL IN DEXTROSE-NACL 20-5-0.45 MEQ/L-%-% IV SOLN
INTRAVENOUS | Status: AC
  Administered 2019-06-22 – 2019-06-23 (×2): via INTRAVENOUS
  Filled 2019-06-22: qty 1000

## 2019-06-22 MED ORDER — IOHEXOL 9 MG/ML PO SOLN: 500.0000 mL | ORAL | Status: AC

## 2019-06-22 MED ORDER — MORPHINE SULFATE (PF) 4 MG/ML IV SOLN
4.0000 mg | Freq: Once | INTRAVENOUS | Status: AC
  Administered 2019-06-22: 4 mg via INTRAVENOUS
  Filled 2019-06-22: qty 1

## 2019-06-22 NOTE — Evaluation (Signed)
Clinical/Bedside Swallow Evaluation Patient Details  Name: Tony [name] MRN: [id number] Date of Birth: [birth year]  Today's Date: 06/22/2019 Time: SLP Start Time (ACUTE ONLY): 1025 SLP Stop Time (ACUTE ONLY): 1051 SLP Time Calculation (min) (ACUTE ONLY): 26 min  Past Medical History:  Past Medical History:  Diagnosis Date  . Alzheimer's disease ([place])   . BPH (benign prostatic hyperplasia)   . CKD (chronic kidney disease)   . Diabetes mellitus    Type II, diagnosed 2011, not on insulin; admitted in 2011 for hyperglycemia  . HTN (hypertension)   . Seizure disorder ([place])    diagnosed in childhood, last seizure was years ago   Past Surgical History:  Past Surgical History:  Procedure Laterality Date  . NO PAST SURGERIES     HPI:  68 year old male with a history of Alzheimer's dementia, CKD stage III, diabetes mellitus, hypertension, seizure disorder presenting with subjective fevers and chills and generalized malaise.  The patient was recently hospitalized from 04/10/2019 to 04/15/2019 with AMS secondary to UTI.  Urine cultures grew Enterococcus and Providencia.  The patient was discharged home with levofloxacin.  The patient was discharged home with an indwelling Foley catheter.  The patient failed a voiding trial in the urology office.  He subsequently underwent another voiding trial on 05/22/2019 and underwent cystoscopy.  He is tentatively scheduled for TURP surgery on 07/10/2019.  Urology was consulted and recommended catheter change during this hospitalization.  Foley catheter was changed on 06/13/2019.  The patient had made a gradual improvement until the evening of 06/19/2019.  He appeared more somnolent at that time.  Repeat labs and chest x-ray were obtained and suggested new right lower lobe opacity.  Patient spiked a fever 102.6 F on the evening of 06/19/2019.  Repeat blood and urine cultures were obtained.  Patient continued to have a fever in the early am 06/22/19.  Blood cultures  were repeated.  CT chest/abd/pelvis were ordered. Pt was feeding himself yesterday 06/21/2019 however today Pt reportedly is unable to do so and seems to "not know what to do with the straw". BSE to assess swallowing function    Assessment / Plan / Recommendation Clinical Impression  Clinical swallowing evaluation administered; Upon SLP entering room Pt responded verbally to SLP stating his name and saying 3-4 word phrases prior to repositioning. RN assisted repositioning Pt in bed causing great discomfort as revealed by Pt calling out in pain and saying "ow, ow". MD entered and completed assessment revealing further discomfort for Pt. After MD completed assessment Pt produced very little verbal output. Pt was provided ice chips with no overt s/sx of aspiration noted; Pt then accepted the straw and drank consecutive sips of thin liquids without incident. Question ice chips (thermal stimulation) to facilitate swallowing. Pt was provided ice cream and consumed this without incident; Pt accepted a bite of solids however despite max cueing Pt held textures and SLP ended up having to [facility] remove from oral cavity. Pt presents with primary cognitive based dysphagia; recommend for Pt to be on a liquid diet, no solids per mouth until mentation improves. Recommend continue with thin liquids and liquid supplements as Pt will accept. Only administer liquids when Pt is ALERT and RESPONSIVE. ST will continue to follow for diet upgrade as is clinically appropriate SLP Visit Diagnosis: Dysphagia, unspecified (R13.10)    Aspiration Risk       Diet Recommendation Thin liquid   Liquid Administration via: Spoon;Cup;Straw Medication Administration: Crushed with puree Supervision: Full supervision/cueing for compensatory  strategies;Staff to assist with self feeding Compensations: Minimize environmental distractions;Slow rate;Small sips/bites;Follow solids with liquid Postural Changes: Seated upright at 90  degrees;Remain upright for at least 30 minutes after po intake    Other  Recommendations Oral Care Recommendations: Oral care BID   Follow up Recommendations 24 hour supervision/assistance;Skilled Nursing facility      Frequency and Duration min 1 x/week  1 week       Prognosis Prognosis for Safe Diet Advancement: Fair      Swallow Study   General Date of Onset: 06/12/19 HPI: 68 year old male with a history of Alzheimer's dementia, CKD stage III, diabetes mellitus, hypertension, seizure disorder presenting with subjective fevers and chills and generalized malaise.  The patient was recently hospitalized from 04/10/2019 to 04/15/2019 with AMS secondary to UTI.  Urine cultures grew Enterococcus and Providencia.  The patient was discharged home with levofloxacin.  The patient was discharged home with an indwelling Foley catheter.  The patient failed a voiding trial in the urology office.  He subsequently underwent another voiding trial on 05/22/2019 and underwent cystoscopy.  He is tentatively scheduled for TURP surgery on 07/10/2019.  Urology was consulted and recommended catheter change during this hospitalization.  Foley catheter was changed on 06/13/2019.  The patient had made a gradual improvement until the evening of 06/19/2019.  He appeared more somnolent at that time.  Repeat labs and chest x-ray were obtained and suggested new right lower lobe opacity.  Patient spiked a fever 102.6 F on the evening of 06/19/2019.  Repeat blood and urine cultures were obtained.  Patient continued to have a fever in the early am 06/22/19.  Blood cultures were repeated.  CT chest/abd/pelvis were ordered. Pt was feeding himself yesterday 06/21/2019 however today Pt reportedly is unable to do so and seems to "not know what to do with the straw". BSE to assess swallowing function  Type of Study: Bedside Swallow Evaluation Previous Swallow Assessment: none in chart Diet Prior to this Study: Regular;Thin  liquids Temperature Spikes Noted: No Respiratory Status: Room air History of Recent Intubation: No Behavior/Cognition: Alert;Cooperative;Confused Oral Cavity Assessment: Within Functional Limits Oral Care Completed by SLP: Recent completion by staff Oral Cavity - Dentition: Poor condition;Missing dentition Vision: Functional for self-feeding Self-Feeding Abilities: Total assist Patient Positioning: Upright in bed Baseline Vocal Quality: Normal Volitional Cough: Cognitively unable to elicit Volitional Swallow: Unable to elicit    Oral/Motor/Sensory Function Overall Oral Motor/Sensory Function: Within functional limits   Ice Chips Ice chips: Within functional limits   Thin Liquid Thin Liquid: Within functional limits    Nectar Thick Nectar Thick Liquid: Not tested   Honey Thick Honey Thick Liquid: Not tested   Puree Puree: Impaired Presentation: Spoon Oral Phase Impairments: Poor awareness of bolus Oral Phase Functional Implications: Oral holding   Solid     Solid: Impaired Presentation: Spoon Oral Phase Impairments: Poor awareness of bolus Oral Phase Functional Implications: Oral holding     Amelia H. Roddie Mc, CCC-SLP Speech Language Pathologist  Wende Bushy 06/22/2019,12:00 PM

## 2019-06-22 NOTE — Progress Notes (Signed)
The patient's brother is at the bedside requesting to see this RN and the Nursing supervisor. Responded to the call and the brother informed me he wanted the patient to be transferred to [facility] for more aggressive care. Collaborated with MD and received a new order. Patient is going to be transferred to [facility] C room 16. His brother is aware. Already given report and awaiting for care link for transport. Will continue to monitor.

## 2019-06-22 NOTE — Progress Notes (Signed)
PROGRESS NOTE  Tony [name] [id number] DOB: 04-28-1951 DOA: 06/12/2019 PCP: Velna Ochs, MD Brief History: 69 year old male with a history of Alzheimer's dementia, CKD stage III, diabetes mellitus, hypertension, seizure disorder presenting with subjective fevers and chills and generalized malaise. The patient was recently hospitalized from9/05/2019 to 04/15/2019 withAMSsecondary to UTI. Urine cultures grew Enterococcus andProvidencia.The patient was discharged home with levofloxacin. The patient was discharged home with an indwelling Foley catheter. The patient failed a voiding trial in the urology office. He subsequently underwent another voiding trial on 05/22/2019 and underwent cystoscopy. He is tentatively scheduled for TURP surgery on 07/10/2019.Urology was consulted and recommended catheter change during this hospitalization. Foley catheter was changed on 06/13/2019.The patient had made a gradual improvement until the evening of 06/19/2019. He appeared more somnolent at that time. Repeat labs and chest x-ray were obtained and suggested new right lower lobe opacity. Patient spiked a fever 102.6 F on the evening of 06/19/2019. Repeat blood and urine cultures were obtained.  Patient continued to have a fever in the early am 06/22/19.  Blood cultures were repeated.  CT chest/abd/pelvis were ordered  Assessment/Plan: Sepsis -Present on admission -Secondary toCAUTI -Sepsis physiology resolved -WBC improved and fever resolved -Lactic acid peaked 2.8 -06/19/19 evening--fever 102.6 -06/21/19 evening--fever 102.7 -personally reviewed CXR--RLL opacity -06/19/19 UA with pyuria, culture neg -06/20/19--broaden abx coverage--start zosyn -check lactate, PCT -repeat UA  CAUTI -Initially on cefepime 06/12/2019>>06/16/2019 -Urine culture growing Enterococcus faecalis and Citrobacter -ampicillin and Cefdinir>>>zosyn  HCAP -neg zosyn -MRSA  screen--neg -speech therapy eval  Somnolence -11/20-pt is more somnolent but arouses and answers questions -repeat UA -ABG--7.4 74/43/60/30 RA -ammonia--30 -VPA level--48 -personally reviewed CXR--no consolidation, ?RLL opacity  Acute on chronic renal failure--CKD stage III -Secondary to sepsis and volume depletion -Baseline creatinine 1.5-1.8 -Serum creatinine peaked 2.98 -restart IVF  Hypernatremia -Discontinue D5 half-normal saline -ContinueD5W at100cc/h-->improved -remained stable after stopping D5W  Essential hypertension -Continue amlodipine  Seizure disorder -Continue Depakote  Alzheimer's dementia -Continue donepezil -brother has noted functional and cognitive decline over last 6 months  BPH -continue flomax.  stage II pressure injury -present on admission -frequent repositioning -preventive measures.   coagulase-negative bacteremia -represents contaminant  Hypokalemia -replete -check mag--1.8       Disposition Plan:SNF 11/23 if stable Family Communication:Brother updated on 11/22  Consultants:urology  Code Status: FULL  DVT Prophylaxis: [place] Heparin    Procedures: As Listed in Progress Note Above  Antibiotics: Cefepime 11/13>>11/16 Amoxil 11/17>>>11/21 Cefdinir 11/17>>11/21 Zosyn 11/21>>>    Subjective: Pt is pleasantly confused.  He denies cp.  Remainder ROS unobtainable due to confusion.  Objective: Vitals:   06/21/19 2120 06/22/19 0000 06/22/19 0131 06/22/19 0529  BP: 140/65   125/74  Pulse: 97   (!) 105  Resp: 20   20  Temp: (!) 102.7 F (39.3 C) (!) 101.3 F (38.5 C) (!) 100.6 F (38.1 C) 99.1 F (37.3 C)  TempSrc: Oral Oral Oral Oral  SpO2: 95%   96%  Weight:      Height:        Intake/Output Summary (Last 24 hours) at 06/22/2019 1054 Last data filed at 06/22/2019 0900 Gross per 24 hour  Intake 1050 ml  Output 1050 ml  Net 0 ml   Weight change:  Exam:   General:  Pt  is alert, does not follow commands appropriately, not in acute distress  HEENT: No icterus, No thrush, No neck mass, [place]/AT  Cardiovascular: RRR, S1/S2, no rubs,  no gallops  Respiratory: bibasilar rales. No wheeze  Abdomen: Soft/+BS, non tender, non distended, no guarding  Extremities: No edema, No lymphangitis, No petechiae, No rashes, no synovitis   Data Reviewed: I have personally reviewed following labs and imaging studies Basic Metabolic Panel: Recent Labs  Lab 06/18/19 0706 06/19/19 0521 06/20/19 0924 06/21/19 0743 06/22/19 0616  NA 144 139 141 139 141  K 3.4* 3.9 3.7 3.7 3.9  CL 106 103 100 100 99  CO2 29 27 31 28 31   GLUCOSE 133* 126* 134* 154* 127*  BUN 18 15 18 17 17   CREATININE 1.37* 1.32* 1.28* 1.43* 1.53*  CALCIUM 8.9 8.6* 9.2 8.9 8.8*  MG  --  1.8 1.9  --  2.1   Liver Function Tests: No results for input(s): AST, ALT, ALKPHOS, BILITOT, PROT, ALBUMIN in the last 168 hours. No results for input(s): LIPASE, AMYLASE in the last 168 hours. Recent Labs  Lab 06/19/19 1937  AMMONIA 30   Coagulation Profile: No results for input(s): INR, PROTIME in the last 168 hours. CBC: Recent Labs  Lab 06/18/19 0706 06/19/19 1937 06/20/19 0924 06/21/19 0743 06/22/19 0616  WBC 9.1 10.8* 9.4 11.6* 15.0*  HGB 11.3* 11.3* 11.5* 11.5* 12.3*  HCT 36.7* 34.0* 35.4* 34.9* 39.0  MCV 101.9* 94.2 96.5 95.6 99.2  PLT 141* 175 200 244 310   Cardiac Enzymes: No results for input(s): CKTOTAL, CKMB, CKMBINDEX, TROPONINI in the last 168 hours. BNP: Invalid input(s): POCBNP CBG: No results for input(s): GLUCAP in the last 168 hours. HbA1C: No results for input(s): HGBA1C in the last 72 hours. Urine analysis:    Component Value Date/Time   COLORURINE YELLOW 06/19/2019 1524   APPEARANCEUR HAZY (A) 06/19/2019 1524   LABSPEC 1.016 06/19/2019 1524   PHURINE 6.0 06/19/2019 1524   GLUCOSEU NEGATIVE 06/19/2019 1524   HGBUR SMALL (A) 06/19/2019 1524   BILIRUBINUR NEGATIVE  06/19/2019 1524   KETONESUR NEGATIVE 06/19/2019 1524   PROTEINUR 100 (A) 06/19/2019 1524   UROBILINOGEN 0.2 02/07/2010 1547   NITRITE NEGATIVE 06/19/2019 1524   LEUKOCYTESUR SMALL (A) 06/19/2019 1524   Sepsis Labs: @LABRCNTIP (procalcitonin:4,lacticidven:4) ) Recent Results (from the past 240 hour(s))  SARS CORONAVIRUS 2 ([name] 6-24 HRS) Nasopharyngeal Nasopharyngeal Swab     Status: None   Collection Time: 06/12/19 11:57 AM   Specimen: Nasopharyngeal Swab  Result Value Ref Range Status   SARS Coronavirus 2 NEGATIVE NEGATIVE Final    Comment: (NOTE) SARS-CoV-2 target nucleic acids are NOT DETECTED. The SARS-CoV-2 RNA is generally detectable in upper and lower respiratory specimens during the acute phase of infection. Negative results do not preclude SARS-CoV-2 infection, do not rule out co-infections with other pathogens, and should not be used as the sole basis for treatment or other patient management decisions. Negative results must be combined with clinical observations, patient history, and epidemiological information. The expected result is Negative. Fact Sheet for Patients: SugarRoll.be Fact Sheet for Healthcare Providers: https://www.woods-mathews.com/ This test is not yet approved or cleared by the [place] FDA and  has been authorized for detection and/or diagnosis of SARS-CoV-2 by FDA under an Emergency Use Authorization (EUA). This EUA will remain  in effect (meaning this test can be used) for the duration of the COVID-19 declaration under Section 56 4(b)(1) of the Act, 21 U.S.C. section 360bbb-3(b)(1), unless the authorization is terminated or revoked sooner. Performed at [facility], [place] [address] [place], [place] [address]   Culture, blood (Routine x 2)     Status:  None   Collection Time: 06/12/19 12:02 PM   Specimen: BLOOD RIGHT HAND  Result Value Ref Range Status   Specimen Description BLOOD RIGHT HAND   Final   Special Requests   Final    BOTTLES DRAWN AEROBIC AND ANAEROBIC Blood Culture results may not be optimal due to an inadequate volume of blood received in culture bottles   Culture   Final    NO GROWTH 5 DAYS Performed at [facility], [address], [place], [place] [address]    Report Status 06/17/2019 FINAL  Final  Urine culture     Status: Abnormal   Collection Time: 06/12/19 12:07 PM   Specimen: In/Out Cath Urine  Result Value Ref Range Status   Specimen Description   Final    IN/OUT CATH URINE Performed at [facility], [address] [place], [place] [address]    Special Requests   Final    NONE Performed at [facility], [address], [place], [place] [address]    Culture (A)  Final    >=100,000 COLONIES/mL CITROBACTER BRAAKII >=100,000 COLONIES/mL ENTEROCOCCUS FAECALIS    Report Status 06/16/2019 FINAL  Final   Organism ID, Bacteria CITROBACTER BRAAKII (A)  Final   Organism ID, Bacteria ENTEROCOCCUS FAECALIS (A)  Final      Susceptibility   Citrobacter braakii - MIC*    CEFAZOLIN >=64 RESISTANT Resistant     CEFTRIAXONE <=1 SENSITIVE Sensitive     CIPROFLOXACIN <=0.25 SENSITIVE Sensitive     GENTAMICIN <=1 SENSITIVE Sensitive     IMIPENEM <=0.25 SENSITIVE Sensitive     NITROFURANTOIN <=16 SENSITIVE Sensitive     TRIMETH/SULFA <=20 SENSITIVE Sensitive     PIP/TAZO <=4 SENSITIVE Sensitive     * >=100,000 COLONIES/mL CITROBACTER BRAAKII   Enterococcus faecalis - MIC*    AMPICILLIN <=2 SENSITIVE Sensitive     LEVOFLOXACIN 0.5 SENSITIVE Sensitive     NITROFURANTOIN <=16 SENSITIVE Sensitive     VANCOMYCIN 1 SENSITIVE Sensitive     * >=100,000 COLONIES/mL ENTEROCOCCUS FAECALIS  Culture, blood (Routine x 2)     Status: Abnormal   Collection Time: 06/12/19 12:18 PM   Specimen: BLOOD RIGHT ARM  Result Value Ref Range Status   Specimen Description   Final    BLOOD RIGHT ARM Performed at [facility], [address], [place], [name] [address]    Special  Requests   Final    BOTTLES DRAWN AEROBIC AND ANAEROBIC Blood Culture results may not be optimal due to an inadequate volume of blood received in culture bottles Performed at [facility], [address], [place], [place] [address]    Culture  Setup Time   Final    GRAM POSITIVE COCCI ANAEROBIC BOTTLE ONLY Gram Stain Report Called to,Read Back By and Verified With: JACKSON @ 1003 ON [id number] BY HENDERSON L. Performed at [facility], [address], [place], [place] [address]    Culture (A)  Final    STAPHYLOCOCCUS SPECIES (COAGULASE NEGATIVE) THE SIGNIFICANCE OF ISOLATING THIS ORGANISM FROM A SINGLE SET OF BLOOD CULTURES WHEN MULTIPLE SETS ARE DRAWN IS UNCERTAIN. PLEASE NOTIFY THE MICROBIOLOGY DEPARTMENT WITHIN ONE WEEK IF SPECIATION AND SENSITIVITIES ARE REQUIRED. Performed at [facility], [place] [address], [place], [place] [address]    Report Status 06/15/2019 FINAL  Final  SARS CORONAVIRUS 2 (Neka Bise 6-24 HRS) Nasopharyngeal Nasopharyngeal Swab     Status: None   Collection Time: 06/19/19 11:59 AM   Specimen: Nasopharyngeal Swab  Result Value Ref Range  Status   SARS Coronavirus 2 NEGATIVE NEGATIVE Final    Comment: (NOTE) SARS-CoV-2 target nucleic acids are NOT DETECTED. The SARS-CoV-2 RNA is generally detectable in upper and lower respiratory specimens during the acute phase of infection. Negative results do not preclude SARS-CoV-2 infection, do not rule out co-infections with other pathogens, and should not be used as the sole basis for treatment or other patient management decisions. Negative results must be combined with clinical observations, patient history, and epidemiological information. The expected result is Negative. Fact Sheet for Patients: SugarRoll.be Fact Sheet for Healthcare Providers: https://www.woods-mathews.com/ This test is not yet approved or cleared by the [place] FDA and  has been authorized for detection  and/or diagnosis of SARS-CoV-2 by FDA under an Emergency Use Authorization (EUA). This EUA will remain  in effect (meaning this test can be used) for the duration of the COVID-19 declaration under Section 56 4(b)(1) of the Act, 21 U.S.C. section 360bbb-3(b)(1), unless the authorization is terminated or revoked sooner. Performed at [facility], [place] [address] [place], [place] [address]   Culture, Urine     Status: None   Collection Time: 06/19/19  3:24 PM   Specimen: Urine, Clean Catch  Result Value Ref Range Status   Specimen Description   Final    URINE, CLEAN CATCH Performed at [facility], [address], [place], [place] [address]    Special Requests   Final    NONE Performed at [facility], [address] [place], [place] [address]    Culture   Final    NO GROWTH Performed at [facility], [place] [address], [place], [place] [address]    Report Status 06/20/2019 FINAL  Final  Culture, blood (routine x 2)     Status: None (Preliminary result)   Collection Time: 06/20/19 12:48 PM   Specimen: Right Antecubital; Blood  Result Value Ref Range Status   Specimen Description   Final    RIGHT ANTECUBITAL BOTTLES DRAWN AEROBIC AND ANAEROBIC   Special Requests Blood Culture adequate volume  Final   Culture   Final    NO GROWTH 2 DAYS Performed at [facility], [address] [place], [place] [address]    Report Status PENDING  Incomplete  Culture, blood (routine x 2)     Status: None (Preliminary result)   Collection Time: 06/20/19 12:56 PM   Specimen: BLOOD LEFT WRIST  Result Value Ref Range Status   Specimen Description BLOOD LEFT WRIST BOTTLES DRAWN AEROBIC ONLY  Final   Special Requests Blood Culture adequate volume  Final   Culture   Final    NO GROWTH 2 DAYS Performed at [facility], [address], [place], [place] [address]    Report Status PENDING  Incomplete  MRSA PCR Screening     Status: None   Collection Time: 06/20/19  4:05 PM   Specimen: Nasal  Mucosa; Nasopharyngeal  Result Value Ref Range Status   MRSA by PCR NEGATIVE NEGATIVE Final    Comment:        The GeneXpert MRSA Assay (FDA approved for NASAL specimens only), is one component of a comprehensive MRSA colonization surveillance program. It is not intended to diagnose MRSA infection nor to guide or monitor treatment for MRSA infections. Performed at [facility], [address], [place], [place] [address]      Scheduled Meds:  amLODipine  5 mg Oral Daily   Chlorhexidine Gluconate Cloth  6 each Topical Daily   divalproex  1,500 mg Oral QHS   donepezil  5 mg Oral QHS   heparin injection (subcutaneous)  5,000 Units Subcutaneous Q8H   iohexol  500 mL Oral Q1H   iohexol       rosuvastatin  5 mg Oral QHS   tamsulosin  0.8 mg Oral QHS   Continuous Infusions:  dextrose 5 % and 0.45 % NaCl with KCl 20 mEq/L 75 mL/hr at 06/22/19 0954   piperacillin-tazobactam (ZOSYN)  IV 3.375 g (06/22/19 0441)    Procedures/Studies: US Renal  Result Date: 06/14/2019 CLINICAL DATA:  Initial evaluation for acute renal insufficiency. EXAM: RENAL / URINARY TRACT ULTRASOUND COMPLETE COMPARISON:  None. FINDINGS: Right Kidney: Renal measurements: 12.3 x 4.5 x 4.7 cm = volume: 140 mL. Echogenicity within normal limits. No nephrolithiasis or hydronephrosis. 3.3 x 3.2 x 2.1 cm simple cyst present at the interpolar region. 1.8 x 1.4 x 1.5 cm simple cyst present at the lower pole. Left Kidney: Renal measurements: 12.6 x 6.8 x 6.1 cm = volume: 273 mL. Echogenicity within normal limits. No nephrolithiasis or hydronephrosis. 2.7 x 2.2 x 2.3 cm simple cyst present at the lower pole. Bladder: Decompressed with a Foley catheter in place. Other: None. IMPRESSION: 1. No hydronephrosis or other acute finding. 2. Simple bilateral renal cysts as above. Electronically Signed   By: Jeannine Boga M.D.   On: 06/14/2019 14:22   Dg Chest Port 1 View  Result Date: 06/18/2019 CLINICAL DATA:  Dyspnea  EXAM: PORTABLE CHEST 1 VIEW COMPARISON:  Chest radiograph dated 06/12/2019 FINDINGS: The heart size and mediastinal contours are within normal limits. Mild left basilar atelectasis/airspace disease has developed since prior exam. A small left pleural effusion may contribute. There is mild right basilar airspace disease. There is no right pleural effusion or pneumothorax on either side. The visualized skeletal structures are unremarkable. IMPRESSION: 1. Mild left basilar atelectasis/airspace disease and possible small left pleural effusion. 2. Mild right basilar airspace disease. Electronically Signed   By: Zerita Boers M.D.   On: 06/18/2019 19:38   Dg Chest Portable 1 View  Result Date: 06/12/2019 CLINICAL DATA:  Fever. EXAM: PORTABLE CHEST 1 VIEW COMPARISON:  April 10, 2019. FINDINGS: The heart size and mediastinal contours are within normal limits. Both lungs are clear. The visualized skeletal structures are unremarkable. IMPRESSION: No active disease. Electronically Signed   By: Marijo Conception M.D.   On: 06/12/2019 11:04    Orson Eva, DO  [facility] Pager [phone]  If 7PM-7AM, please contact night-coverage [email or web address] Password [email or web address] 06/22/2019, 10:54 AM   LOS: 9 days

## 2019-06-22 NOTE — Care Management Important Message (Signed)
Important Message  Patient Details  Name: Tony [name] MRN: [id number] Date of Birth: 1951/04/16   Medicare Important Message Given:  Yes     [name] 06/22/2019, 2:32 PM

## 2019-06-22 NOTE — TOC Progression Note (Signed)
Transition of Care [facility]) - Progression Note    Patient Details  Name: Tony [name] MRN: [id number] Date of Birth: 01-Oct-1950  [facility] of [facility] CM/SW Contact  Ihor Gully, LCSW Phone Number: 06/22/2019, 2:57 PM  Clinical Narrative:    Update provided to [facility].    Expected Discharge Plan: [place] Barriers to Discharge: Continued Medical Work up  Expected Discharge Plan and Services Expected Discharge Plan: [place] Acute Care Choice: [place]                                         Social Determinants of Health (SDOH) Interventions    Readmission Risk Interventions No flowsheet data found.

## 2019-06-22 NOTE — Plan of Care (Signed)
  Problem: SLP Dysphagia Goals Goal: Patient will utilize recommended strategies Description: Patient will utilize recommended strategies during swallow to increase swallowing safety with Flowsheets (Taken 06/22/2019 1202) Patient will utilize recommended strategies during swallow to increase swallowing safety with: max assist

## 2019-06-22 NOTE — Consult Note (Signed)
Consultation Note Date: 06/22/2019   Patient Name: Tony [name]  DOB: 1951/03/23  MRN: [id number]  Age / Sex: 68 y.o., male  PCP: Velna Ochs, MD Referring Physician: Orson Eva, MD  Reason for Consultation: Establishing goals of care  HPI/Patient Profile: 68 y.o. male  with past medical history of Alzheimer's dementia, CKD stage 3, diabetes, HTN, seizure disorder, UTIs, indwelling urinary catheter, plans for tentative TURP admitted on 06/12/2019 with CAUTI sepsis and concern for HCAP. Fever spiking above 102 F and ongoing work up for fever etiology.   Clinical Assessment and Goals of Care: I met today at [name]'s bedside. He is lethargic but will answer occasionally "yes" but even this is inconsistent and unable to elaborate any further. He has just worked with SLP with recommendations for liquids as he is unable to tolerate solids d/t cognitive deficit. He is undergoing work up of ongoing fever. He is lethargic but noted he did received 4 mg IV morphine ~5 am that could be contributing to lethargy.   I called and spoke with his brother/HCPOA, [name]. [name] initially was very resistant to palliative care as he thought that this equated to comfort care. I explained that palliative care consult is really just conversation to ensure [name] (even if this is for aggressive care) and make sure that we are maximizng his brother's QOL while ensuring that he is getting the information he needs to make good decisions for his brother. [name] was more open to having palliative care to follow and continue conversations with him at this point.   [name] continues to be very hopeful for improvement and hopeful that we can get his brother "over this hump." This hope largely comes from continued grief for their mother that recently died ~6 weeks ago (her birthday is tomorrow). Also had a sister that died in 2023/01/11 and a brother that died  Thanksgiving last year. [name] is not emotionally prepared to face EOL for [name]. He does not wish to make him DNR at this time. He wishes to exhaust all options for improvement at this time before considering comfort options. I did explain current concerns and work up to [employer].   All questions/concerns addressed. Emotional support provided.    Primary Decision Maker HCPOA brother Dickie Cloe    SUMMARY OF RECOMMENDATIONS   - Full aggressive care desired  Code Status/Advance Care Planning:  Full code   Symptom Management:   Per attending.   Pain: I would recommend Tylenol and low dose fentanyl if needed so this does not hang around and contribute to confusion and lethargy.   Palliative Prophylaxis:   Aspiration, Bowel Regimen, Delirium Protocol, Frequent Pain Assessment, Oral Care and Turn Reposition  Additional Recommendations (Limitations, Scope, Preferences):  Full Scope Treatment  Psycho-social/Spiritual:   Desire for further Chaplaincy support:no  Additional Recommendations: Caregiving  Support/Resources and Grief/Bereavement Support  Prognosis:   Overall prognosis poor with overall decline.   Discharge Planning: To Be Determined      Primary Diagnoses: Present on Admission: . Complicated UTI (  urinary tract infection) . Sepsis secondary to UTI ([place]) . Dementia ([place]) . Hypertension . Acute renal failure superimposed on stage 3b chronic kidney disease ([place]) . HLD (hyperlipidemia) . BPH with obstruction/lower urinary tract symptoms   I have reviewed the medical record, interviewed the patient and family, and examined the patient. The following aspects are pertinent.  Past Medical History:  Diagnosis Date  . Alzheimer's disease ([place])   . BPH (benign prostatic hyperplasia)   . CKD (chronic kidney disease)   . Diabetes mellitus    Type II, diagnosed 27-Oct-2009, not on insulin; admitted in 2009/10/27 for hyperglycemia  . HTN (hypertension)   . Seizure disorder ([place])     diagnosed in childhood, last seizure was years ago   Social History   Socioeconomic History  . Marital status: Widowed    Spouse name: Not on file  . Number of children: 2  . Years of education: 32  . Highest education level: Not on file  Occupational History  . 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 Financial resource strain: Not on file  . Food insecurity    Worry: Not on file    Inability: Not on file  . Transportation needs    Medical: Not on file    Non-medical: Not on file  Tobacco Use  . Smoking status: Never Smoker  . Smokeless tobacco: Never Used  Substance and Sexual Activity  . Alcohol use: No    Alcohol/week: 0.0 standard drinks  . Drug use: No  . Sexual activity: Never    Partners: Female    Comment: wife passed away in 08/29/2010  Lifestyle  . Physical activity    Days per week: Not on file    Minutes per session: Not on file  . Stress: Not on file  Relationships  . Social Herbalist on phone: Not on file    Gets together: Not on file    Attends religious service: Not on file    Active member of club or organization: Not on file    Attends meetings of clubs or organizations: Not on file    Relationship status: Not on file  Other Topics Concern  . Not on file  Social History Narrative   Lives in [place] with son   His wife passed away in 2010/10/28        did not complete HS. He has had seizures since adolescence. 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 He got his license and per his brother had a more normal life. He stopped driving 2 years ago. At that time he had the onset of dementia which has been progressive. He lives in his own home and his son, who is disabled, lives with him. His Brother [name], who lives in [place], looks after their affairs. [name] has been working to keep them at home. Ugo is enrolled in [facility] and was  attending adult day care until a recent decline attributed to his mother's death.   Family History  Problem Relation Age of Onset  . Diabetes Mother   . Hypertension Mother    Scheduled Meds: . amLODipine  5 mg Oral Daily  . Chlorhexidine Gluconate Cloth  6 each Topical Daily  . divalproex  1,500 mg Oral QHS  . donepezil  5 mg Oral QHS  . heparin  injection (subcutaneous)  5,000 Units Subcutaneous Q8H  . iohexol  500 mL Oral Q1H  . iohexol      . rosuvastatin  5 mg Oral QHS  . tamsulosin  0.8 mg Oral QHS   Continuous Infusions: . dextrose 5 % and 0.45 % NaCl with KCl 20 mEq/L 75 mL/hr at 06/22/19 0954  . piperacillin-tazobactam (ZOSYN)  IV 3.375 g (06/22/19 0441)   PRN Meds:.acetaminophen **OR** acetaminophen, nystatin, ondansetron **OR** ondansetron (ZOFRAN) IV No Known Allergies Review of Systems  Unable to perform ROS: Dementia    Physical Exam Vitals signs and nursing note reviewed.  Constitutional:      General: He is not in acute distress.    Appearance: He is ill-appearing.  Cardiovascular:     Rate and Rhythm: Tachycardia present.  Pulmonary:     Effort: Pulmonary effort is normal. No tachypnea, accessory muscle usage or respiratory distress.  Abdominal:     Palpations: Abdomen is soft.     Tenderness: There is no abdominal tenderness.  Neurological:     Mental Status: He is lethargic, disoriented and confused.     Vital Signs: BP 125/74 (BP Location: Right Arm)   Pulse (!) 105   Temp 99.1 F (37.3 C) (Oral)   Resp 20   Ht '5\' 11"'  (1.803 m)   Wt 97 kg   SpO2 96%   BMI 29.83 kg/m  Pain Scale: Faces   Pain Score: 5    SpO2: SpO2: 96 % O2 Device:SpO2: 96 % O2 Flow Rate: .O2 Flow Rate (L/min): 2 L/min  IO: Intake/output summary:   Intake/Output Summary (Last 24 hours) at 06/22/2019 1043 Last data filed at 06/22/2019 0900 Gross per 24 hour  Intake 1050 ml  Output 1050 ml  Net 0 ml    LBM: Last BM Date: 06/20/19 Baseline Weight: Weight: 97 kg  Most recent weight: Weight: 97 kg     Palliative Assessment/Data:     Time In/Out: 1030-1045, 1300-1320 Time Total: 35 min Greater than 50%  of this time was spent counseling and coordinating care related to the above assessment and plan.  Signed by: Vinie Sill, NP Palliative Medicine Team Pager # [phone] (M-F 8a-5p) Team Phone # [phone] (Nights/Weekends)

## 2019-06-22 NOTE — Progress Notes (Signed)
Temp at 2120 recorded to be 102.7 degrees F. Upon review of VS at 2300 and noticing this value I administered 650mg  of tylenol. At 0000, temp 101.3 degrees F. MD paged and ordered 524mL bolus. Bolus given at [place]. Other VSS. Pt denies symptoms including pain and/or chills. Will continue to monitor pt.

## 2019-06-23 ENCOUNTER — Inpatient Hospital Stay (HOSPITAL_COMMUNITY): Payer: Medicare (Managed Care)

## 2019-06-23 DIAGNOSIS — F0280 Dementia in other diseases classified elsewhere without behavioral disturbance: Secondary | ICD-10-CM

## 2019-06-23 DIAGNOSIS — G309 Alzheimer's disease, unspecified: Secondary | ICD-10-CM

## 2019-06-23 DIAGNOSIS — R509 Fever, unspecified: Secondary | ICD-10-CM

## 2019-06-23 DIAGNOSIS — J189 Pneumonia, unspecified organism: Secondary | ICD-10-CM

## 2019-06-23 LAB — COMPREHENSIVE METABOLIC PANEL
ALT: 29 U/L (ref 0–44)
AST: 46 U/L — ABNORMAL HIGH (ref 15–41)
Albumin: 2 g/dL — ABNORMAL LOW (ref 3.5–5.0)
Alkaline Phosphatase: 35 U/L — ABNORMAL LOW (ref 38–126)
Anion gap: 10 (ref 5–15)
BUN: 20 mg/dL (ref 8–23)
CO2: 31 mmol/L (ref 22–32)
Calcium: 9 mg/dL (ref 8.9–10.3)
Chloride: 102 mmol/L (ref 98–111)
Creatinine, Ser: 1.66 mg/dL — ABNORMAL HIGH (ref 0.61–1.24)
GFR calc Af Amer: 48 mL/min — ABNORMAL LOW (ref >60)
GFR calc non Af Amer: 41 mL/min — ABNORMAL LOW (ref >60)
Glucose, Bld: 146 mg/dL — ABNORMAL HIGH (ref 70–99)
Potassium: 4.3 mmol/L (ref 3.5–5.1)
Sodium: 143 mmol/L (ref 135–145)
Total Bilirubin: 0.6 mg/dL (ref 0.3–1.2)
Total Protein: 6.9 g/dL (ref 6.5–8.1)

## 2019-06-23 LAB — URINE CULTURE: Culture: NO GROWTH

## 2019-06-23 LAB — CBC WITH DIFFERENTIAL/PLATELET
Abs Immature Granulocytes: 0.35 10*3/uL — ABNORMAL HIGH (ref 0.00–0.07)
Basophils Absolute: 0.1 10*3/uL (ref 0.0–0.1)
Basophils Relative: 0 %
Eosinophils Absolute: 0.1 10*3/uL (ref 0.0–0.5)
Eosinophils Relative: 1 %
HCT: 33.6 % — ABNORMAL LOW (ref 39.0–52.0)
Hemoglobin: 10.9 g/dL — ABNORMAL LOW (ref 13.0–17.0)
Immature Granulocytes: 3 %
Lymphocytes Relative: 12 %
Lymphs Abs: 1.7 10*3/uL (ref 0.7–4.0)
MCH: 31.8 pg (ref 26.0–34.0)
MCHC: 32.4 g/dL (ref 30.0–36.0)
MCV: 98 fL (ref 80.0–100.0)
Monocytes Absolute: 2.1 10*3/uL — ABNORMAL HIGH (ref 0.1–1.0)
Monocytes Relative: 15 %
Neutro Abs: 9.5 10*3/uL — ABNORMAL HIGH (ref 1.7–7.7)
Neutrophils Relative %: 69 %
Platelets: 358 10*3/uL (ref 150–400)
RBC: 3.43 MIL/uL — ABNORMAL LOW (ref 4.22–5.81)
RDW: 12.3 % (ref 11.5–15.5)
WBC: 13.9 10*3/uL — ABNORMAL HIGH (ref 4.0–10.5)
nRBC: 0 % (ref 0.0–0.2)

## 2019-06-23 LAB — ECHOCARDIOGRAM COMPLETE
Height: 71 in
Weight: 3216.95 oz

## 2019-06-23 LAB — GLUCOSE, CAPILLARY: Glucose-Capillary: 145 mg/dL — ABNORMAL HIGH (ref 70–99)

## 2019-06-23 LAB — MAGNESIUM: Magnesium: 2.2 mg/dL (ref 1.7–2.4)

## 2019-06-23 MED ORDER — KCL IN DEXTROSE-NACL 20-5-0.45 MEQ/L-%-% IV SOLN
INTRAVENOUS | Status: AC
  Administered 2019-06-23: 21:00:00 via INTRAVENOUS
  Filled 2019-06-23: qty 1000

## 2019-06-23 NOTE — Progress Notes (Signed)
Patient ID: Tony [name], male   DOB: Oct 18, 1950, 69 y.o.   MRN: [id number]  PROGRESS NOTE    BRANDN SLIWINSKI  [id number] DOB: 01/05/1951 DOA: 06/12/2019 PCP: Velna Ochs, MD   Brief Narrative:  68 year old male with history of Alzheimer's dementia, chronic kidney disease stage III, diabetes mellitus type 2, hypertension, seizure disorder, hospitalization from 04/10/2019-04/15/2019 for altered mental status secondary to Enterococcus and procidentia UTI along with urinary retention with subsequent discharge home on Levaquin and indwelling Foley catheter with outpatient plans for TURP by urology on 07/10/2019 presented with subjective fevers and chills and generalized malaise on 06/12/2019.  Foley catheter was changed on 06/13/2019 as per urology recommendations.  Patient spiked a temperature of 102.6 on the evening of 06/19/2019; repeat labs and chest x-ray were obtained which suggested new right lower lobe opacity.  He was transferred to [facility] on 06/22/2019 per family request.  Assessment & Plan:   Sepsis: Present on admission Probable CAUTI in a patient with chronic indwelling Foley catheter: Possibly related to indwelling Foley catheter -Patient was treated with cefepime since admission and subsequently switched to cefdinir. -Urine culture had grown Citrobacter and Enterococcus faecalis.  Foley catheter was changed on 06/13/2019 as per urology recommendations.  Outpatient follow-up with urology for TURP scheduled for 07/10/2019. -Patient started spiking temperatures again from 06/19/2019.  Probable HCAP Persistent fevers -Still spiking temperatures. -Patient was started on Zosyn from 06/20/2019 onwards.  Repeat blood cultures from 06/20/2019 are negative so far.  Blood and urine cultures repeated on 06/22/2019. -CT of the chest/abdomen and pelvis on 06/22/2019 showed bilateral lower lobe atelectasis with small right and trace left pleural effusions and suspected  cystitis. -SLP evaluation.  Hip and bilateral thigh pain -Questionable cause.  Bilateral thighs are tender to palpation, more on the left side.  Will check x-rays of the hip and bilateral thighs.  If no clear-cut evidence, might have to do CT/MRI.  Acute kidney injury on chronic kidney disease stage III -Baseline creatinine 1.5-1.8.  Creatinine peaked at 2.98.  Improving, 1.66 today.  Monitor.  Leukocytosis next-improving.  Monitor  Hypernatremia  -Improved.  Monitor.  Essential hypertension -Continue amlodipine  Seizure disorder -continue Depakote  Alzheimer's dementia Generalized deconditioning -Continue donepezil.  Apparently, brother has noted functional and cognitive decline over the last 6 months. -Palliative care following.  Currently remains full code. -We will need nursing home placement.  BPH -Continue Flomax  Stage II sacral pressure injury: Present on admission -Continue wound care  Coagulase-negative bacteremia -On presentation.  Possibly contaminant repeat blood cultures have been negative so far.    DVT prophylaxis: Heparin Code Status: Full Family Communication: spoke to [name]/brother on phone on 06/23/2019 Disposition Plan:SNF once medically stable  Consultants: Palliative care Urology  Procedures: None  Antimicrobials:  Anti-infectives (From admission, onward)   Start     Dose/Rate Route Frequency Ordered Stop   06/20/19 2000  piperacillin-tazobactam (ZOSYN) IVPB 3.375 g     3.375 g 12.5 mL/hr over 240 Minutes Intravenous Every 8 hours 06/20/19 1102     06/20/19 1200  piperacillin-tazobactam (ZOSYN) IVPB 3.375 g     3.375 g 100 mL/hr over 30 Minutes Intravenous  Once 06/20/19 1125 06/20/19 1628   06/17/19 1500  amoxicillin (AMOXIL) capsule 500 mg  Status:  Discontinued     500 mg Oral Every 8 hours 06/17/19 1449 06/20/19 1102   06/17/19 1500  cefdinir (OMNICEF) capsule 300 mg  Status:  Discontinued     300 mg Oral Every  12 hours 06/17/19 1449  06/20/19 1102   06/17/19 1400  levofloxacin (LEVAQUIN) tablet 250 mg  Status:  Discontinued     250 mg Oral Daily 06/17/19 1235 06/17/19 1449   06/16/19 2200  amoxicillin-clavulanate (AUGMENTIN) 875-125 MG per tablet 1 tablet  Status:  Discontinued     1 tablet Oral Every 12 hours 06/16/19 1524 06/17/19 1235   06/16/19 1245  cefdinir (OMNICEF) capsule 300 mg  Status:  Discontinued     300 mg Oral Every 12 hours 06/16/19 1231 06/17/19 1235   06/16/19 1245  amoxicillin-clavulanate (AUGMENTIN) 500-125 MG per tablet 500 mg  Status:  Discontinued     1 tablet Oral Every 12 hours 06/16/19 1231 06/16/19 1524   06/14/19 1400  vancomycin (VANCOCIN) IVPB 1000 mg/200 mL premix  Status:  Discontinued     1,000 mg 200 mL/hr over 60 Minutes Intravenous Every 24 hours 06/13/19 1102 06/13/19 1111   06/14/19 0912  ceFEPIme (MAXIPIME) 2 g in sodium chloride 0.9 % 100 mL IVPB  Status:  Discontinued     2 g 200 mL/hr over 30 Minutes Intravenous Every 24 hours 06/13/19 1111 06/16/19 1231   06/13/19 1600  vancomycin (VANCOCIN) IVPB 1000 mg/200 mL premix  Status:  Discontinued     1,000 mg 200 mL/hr over 60 Minutes Intravenous Every 24 hours 06/13/19 1111 06/15/19 1005   06/13/19 1130  vancomycin (VANCOCIN) IVPB 1000 mg/200 mL premix  Status:  Discontinued     1,000 mg 200 mL/hr over 60 Minutes Intravenous Every 1 hr x 2 06/13/19 1102 06/13/19 1109   06/12/19 2200  ceFEPIme (MAXIPIME) 2 g in sodium chloride 0.9 % 100 mL IVPB  Status:  Discontinued     2 g 200 mL/hr over 30 Minutes Intravenous Every 12 hours 06/12/19 1530 06/13/19 1111   06/12/19 1300  vancomycin (VANCOCIN) IVPB 1000 mg/200 mL premix  Status:  Discontinued     1,000 mg 200 mL/hr over 60 Minutes Intravenous Every 1 hr x 2 06/12/19 1215 06/12/19 1431   06/12/19 1215  ceFEPIme (MAXIPIME) 2 g in sodium chloride 0.9 % 100 mL IVPB     2 g 200 mL/hr over 30 Minutes Intravenous  Once 06/12/19 1207 06/12/19 1336   06/12/19 1215  metroNIDAZOLE (FLAGYL)  IVPB 500 mg  Status:  Discontinued     500 mg 100 mL/hr over 60 Minutes Intravenous  Once 06/12/19 1207 06/12/19 1442   06/12/19 1215  vancomycin (VANCOCIN) IVPB 1000 mg/200 mL premix  Status:  Discontinued     1,000 mg 200 mL/hr over 60 Minutes Intravenous  Once 06/12/19 1207 06/12/19 1215   06/12/19 1200  cefTRIAXone (ROCEPHIN) 1 g in sodium chloride 0.9 % 100 mL IVPB  Status:  Discontinued     1 g 200 mL/hr over 30 Minutes Intravenous  Once 06/12/19 1154 06/12/19 1207       Subjective: Patient seen and examined at bedside.  He is awake but confused to time.  Nursing staff reports pain in the right eye.  Still having temperature spikes.  Objective: Vitals:   06/22/19 2111 06/23/19 0438 06/23/19 0630 06/23/19 1000  BP: 123/72 139/85 117/66   Pulse: (!) 104 99 (!) 102 93  Resp: 18 16 17 12   Temp: (!) 100.7 F (38.2 C) (!) 101.6 F (38.7 C) 98.3 F (36.8 C)   TempSrc: Oral Oral Oral   SpO2: 95% 100% 95% 97%  Weight:   91.2 kg   Height:   5\' 11"  (1.803 m)  Intake/Output Summary (Last 24 hours) at 06/23/2019 1020 Last data filed at 06/23/2019 1000 Gross per 24 hour  Intake 679.55 ml  Output 1000 ml  Net -320.45 ml   Filed Weights   06/12/19 1039 06/23/19 0630  Weight: 97 kg 91.2 kg    Examination:  General exam: Appears calm and comfortable.  Looks older than stated age.  Looks chronically ill Respiratory system: Bilateral decreased breath sounds at bases Cardiovascular system: S1 & S2 heard, intermittently tachycardic Gastrointestinal system: Abdomen is nondistended, soft and nontender. Normal bowel sounds heard. Extremities: No cyanosis, clubbing; trace edema Musculoskeletal: Left thigh tender to palpation, no obvious swelling.  Not much tenderness on the right thigh Central nervous system: Awake but confused to time. No focal neurological deficits. Moving extremities Skin: No rashes, lesions or ulcers Psychiatry: Could not be assessed because of mental  status    Data Reviewed: I have personally reviewed following labs and imaging studies  CBC: Recent Labs  Lab 06/19/19 1937 06/20/19 0924 06/21/19 0743 06/22/19 0616 06/23/19 0801  WBC 10.8* 9.4 11.6* 15.0* 13.9*  NEUTROABS  --   --   --   --  9.5*  HGB 11.3* 11.5* 11.5* 12.3* 10.9*  HCT 34.0* 35.4* 34.9* 39.0 33.6*  MCV 94.2 96.5 95.6 99.2 98.0  PLT 175 200 244 310 [id number]   Basic Metabolic Panel: Recent Labs  Lab 06/19/19 0521 06/20/19 0924 06/21/19 0743 06/22/19 0616 06/23/19 0801  NA 139 141 139 141 143  K 3.9 3.7 3.7 3.9 4.3  CL 103 100 100 99 102  CO2 27 31 28 31 31   GLUCOSE 126* 134* 154* 127* 146*  BUN 15 18 17 17 20   CREATININE 1.32* 1.28* 1.43* 1.53* 1.66*  CALCIUM 8.6* 9.2 8.9 8.8* 9.0  MG 1.8 1.9  --  2.1 2.2   GFR: Estimated Creatinine Clearance: 48.5 mL/min (A) (by C-G formula based on SCr of 1.66 mg/dL (H)). Liver Function Tests: Recent Labs  Lab 06/23/19 0801  AST 46*  ALT 29  ALKPHOS 35*  BILITOT 0.6  PROT 6.9  ALBUMIN 2.0*   No results for input(s): LIPASE, AMYLASE in the last 168 hours. Recent Labs  Lab 06/19/19 1937  AMMONIA 30   Coagulation Profile: No results for input(s): INR, PROTIME in the last 168 hours. Cardiac Enzymes: No results for input(s): CKTOTAL, CKMB, CKMBINDEX, TROPONINI in the last 168 hours. BNP (last 3 results) No results for input(s): PROBNP in the last 8760 hours. HbA1C: No results for input(s): HGBA1C in the last 72 hours. CBG: Recent Labs  Lab 06/23/19 0742  GLUCAP 145*   Lipid Profile: No results for input(s): CHOL, HDL, LDLCALC, TRIG, CHOLHDL, LDLDIRECT in the last 72 hours. Thyroid Function Tests: No results for input(s): TSH, T4TOTAL, FREET4, T3FREE, THYROIDAB in the last 72 hours. Anemia Panel: No results for input(s): VITAMINB12, FOLATE, FERRITIN, TIBC, IRON, RETICCTPCT in the last 72 hours. Sepsis Labs: Recent Labs  Lab 06/19/19 1937 06/20/19 1248 06/22/19 1345  PROCALCITON 0.21  --  0.24   LATICACIDVEN  --  1.4 1.8    Recent Results (from the past 240 hour(s))  SARS CORONAVIRUS 2 (TAT 6-24 HRS) Nasopharyngeal Nasopharyngeal Swab     Status: None   Collection Time: 06/19/19 11:59 AM   Specimen: Nasopharyngeal Swab  Result Value Ref Range Status   SARS Coronavirus 2 NEGATIVE NEGATIVE Final    Comment: (NOTE) SARS-CoV-2 target nucleic acids are NOT DETECTED. The SARS-CoV-2 RNA is generally detectable in upper and lower respiratory  specimens during the acute phase of infection. Negative results do not preclude SARS-CoV-2 infection, do not rule out co-infections with other pathogens, and should not be used as the sole basis for treatment or other patient management decisions. Negative results must be combined with clinical observations, patient history, and epidemiological information. The expected result is Negative. Fact Sheet for Patients: SugarRoll.be Fact Sheet for Healthcare Providers: https://www.woods-mathews.com/ This test is not yet approved or cleared by the [place] FDA and  has been authorized for detection and/or diagnosis of SARS-CoV-2 by FDA under an Emergency Use Authorization (EUA). This EUA will remain  in effect (meaning this test can be used) for the duration of the COVID-19 declaration under Section 56 4(b)(1) of the Act, 21 U.S.C. section 360bbb-3(b)(1), unless the authorization is terminated or revoked sooner. Performed at [facility], [place] [address], [place], [place] [address]   Culture, Urine     Status: None   Collection Time: 06/19/19  3:24 PM   Specimen: Urine, Clean Catch  Result Value Ref Range Status   Specimen Description   Final    URINE, CLEAN CATCH Performed at [facility], [address], [place], [place] [address]    Special Requests   Final    NONE Performed at [facility], [address], [place], [place] [address]    Culture   Final    NO GROWTH Performed at [facility], [place] [address], [place], [place] [address]    Report Status 06/20/2019 FINAL  Final  Culture, blood (routine x 2)     Status: None (Preliminary result)   Collection Time: 06/20/19 12:48 PM   Specimen: Right Antecubital; Blood  Result Value Ref Range Status   Specimen Description   Final    RIGHT ANTECUBITAL BOTTLES DRAWN AEROBIC AND ANAEROBIC   Special Requests Blood Culture adequate volume  Final   Culture   Final    NO GROWTH 3 DAYS Performed at [facility], [address], [place], [place] [address]    Report Status PENDING  Incomplete  Culture, blood (routine x 2)     Status: None (Preliminary result)   Collection Time: 06/20/19 12:56 PM   Specimen: BLOOD LEFT WRIST  Result Value Ref Range Status   Specimen Description BLOOD LEFT WRIST BOTTLES DRAWN AEROBIC ONLY  Final   Special Requests Blood Culture adequate volume  Final   Culture   Final    NO GROWTH 3 DAYS Performed at [facility], [address], [place], [place] [address]    Report Status PENDING  Incomplete  MRSA PCR Screening     Status: None   Collection Time: 06/20/19  4:05 PM   Specimen: Nasal Mucosa; Nasopharyngeal  Result Value Ref Range Status   MRSA by PCR NEGATIVE NEGATIVE Final    Comment:        The GeneXpert MRSA Assay (FDA approved for NASAL specimens only), is one component of a comprehensive MRSA colonization surveillance program. It is not intended to diagnose MRSA infection nor to guide or monitor treatment for MRSA infections. Performed at [facility], [address], [place], [place] [address]   SARS CORONAVIRUS 2 (TAT 6-24 HRS) Nasopharyngeal Nasopharyngeal Swab     Status: None   Collection Time: 06/21/19  4:10 PM   Specimen: Nasopharyngeal Swab  Result Value Ref Range Status   SARS Coronavirus 2 NEGATIVE NEGATIVE Final    Comment: (NOTE) SARS-CoV-2 target nucleic acids are NOT DETECTED. The SARS-CoV-2 RNA is  generally detectable in upper and lower respiratory specimens  during the acute phase of infection. Negative results do not preclude SARS-CoV-2 infection, do not rule out co-infections with other pathogens, and should not be used as the sole basis for treatment or other patient management decisions. Negative results must be combined with clinical observations, patient history, and epidemiological information. The expected result is Negative. Fact Sheet for Patients: SugarRoll.be Fact Sheet for Healthcare Providers: https://www.woods-mathews.com/ This test is not yet approved or cleared by the [place] FDA and  has been authorized for detection and/or diagnosis of SARS-CoV-2 by FDA under an Emergency Use Authorization (EUA). This EUA will remain  in effect (meaning this test can be used) for the duration of the COVID-19 declaration under Section 56 4(b)(1) of the Act, 21 U.S.C. section 360bbb-3(b)(1), unless the authorization is terminated or revoked sooner. Performed at [facility], [place] [address], [place], [place] [address]   Culture, blood (routine x 2)     Status: None (Preliminary result)   Collection Time: 06/22/19  1:45 PM   Specimen: BLOOD RIGHT ARM  Result Value Ref Range Status   Specimen Description BLOOD RIGHT ARM  Final   Special Requests   Final    BOTTLES DRAWN AEROBIC AND ANAEROBIC Blood Culture adequate volume   Culture   Final    NO GROWTH < 24 HOURS Performed at [facility], [address], [place], [place] [address]    Report Status PENDING  Incomplete  Culture, blood (routine x 2)     Status: None (Preliminary result)   Collection Time: 06/22/19  1:45 PM   Specimen: BLOOD RIGHT ARM  Result Value Ref Range Status   Specimen Description BLOOD RIGHT ARM  Final   Special Requests   Final    BOTTLES DRAWN AEROBIC AND ANAEROBIC Blood Culture adequate volume   Culture   Final    NO GROWTH < 24 HOURS Performed at [facility], [address], [place], [place] [address]     Report Status PENDING  Incomplete         Radiology Studies: Ct Abdomen Pelvis Wo Contrast  Result Date: 06/22/2019 CLINICAL DATA:  Fever, sepsis, right lower lobe opacity on chest radiograph. Recent hospitalization for altered mental status secondary to UTI. EXAM: CT CHEST, ABDOMEN AND PELVIS WITHOUT CONTRAST TECHNIQUE: Multidetector CT imaging of the chest, abdomen and pelvis was performed following the standard protocol without IV contrast. COMPARISON:  Chest radiographs dated 06/18/2019. Renal ultrasound dated 06/14/2019. CT chest dated 04/10/2019. FINDINGS: CT CHEST FINDINGS Cardiovascular: Heart is normal in size. No pericardial effusion. No evidence of thoracic aortic aneurysm. Mediastinum/Nodes: No suspicious mediastinal lymphadenopathy. Visualized thyroid is unremarkable. Lungs/Pleura: Small right and trace left pleural effusions. Bilateral lower lobe opacities, likely atelectasis. No frank interstitial edema. Evaluation of the lung parenchyma is constrained by respiratory motion. Within that constraint, there are no suspicious pulmonary nodules. 4 mm triangular subpleural nodule in the posterior left upper lobe (series 4/image 34), favoring a benign subpleural lymph node, unchanged. No pneumothorax. Musculoskeletal: Mild degenerative changes of the mid thoracic spine. CT ABDOMEN PELVIS FINDINGS Motion degraded images. Hepatobiliary: Unenhanced liver is unremarkable. Layering gallbladder sludge and/or noncalcified gallstones (series 3/image 84). No associated inflammatory changes. No intrahepatic or extrahepatic ductal dilatation. Pancreas: Within normal limits. Spleen: Within normal limits. Adrenals/Urinary Tract: Adrenal glands are within normal limits. Right renal cysts, including a 3.3 cm posterior interpolar right renal cyst (series 3/image 66), poorly evaluated. Left kidney is unremarkable. No hydronephrosis. Thick-walled  bladder with perivesical stranding, suggesting cystitis. Indwelling  Foley catheter. Stomach/Bowel: Stomach is within normal limits. No evidence of bowel obstruction. Normal appendix (series 3/image 89). Left colonic diverticulosis, without evidence of diverticulitis. Vascular/Lymphatic: No evidence of abdominal aortic aneurysm. No suspicious abdominopelvic lymphadenopathy. Reproductive: Prostatomegaly, suggesting BPH. Other: No abdominopelvic ascites. Musculoskeletal: Mild degenerative changes of the visualized thoracolumbar spine. IMPRESSION: Motion degraded images. Suspected bilateral lower lobe atelectasis. Small right and trace left pleural effusions. Suspected cystitis.  Indwelling Foley catheter. Prostatomegaly, suggesting BPH. Additional ancillary findings as above. Electronically Signed   By: Julian Hy M.D.   On: 06/22/2019 12:38   Ct Chest Wo Contrast  Result Date: 06/22/2019 CLINICAL DATA:  Fever, sepsis, right lower lobe opacity on chest radiograph. Recent hospitalization for altered mental status secondary to UTI. EXAM: CT CHEST, ABDOMEN AND PELVIS WITHOUT CONTRAST TECHNIQUE: Multidetector CT imaging of the chest, abdomen and pelvis was performed following the standard protocol without IV contrast. COMPARISON:  Chest radiographs dated 06/18/2019. Renal ultrasound dated 06/14/2019. CT chest dated 04/10/2019. FINDINGS: CT CHEST FINDINGS Cardiovascular: Heart is normal in size. No pericardial effusion. No evidence of thoracic aortic aneurysm. Mediastinum/Nodes: No suspicious mediastinal lymphadenopathy. Visualized thyroid is unremarkable. Lungs/Pleura: Small right and trace left pleural effusions. Bilateral lower lobe opacities, likely atelectasis. No frank interstitial edema. Evaluation of the lung parenchyma is constrained by respiratory motion. Within that constraint, there are no suspicious pulmonary nodules. 4 mm triangular subpleural nodule in the posterior left upper lobe (series 4/image 34), favoring a benign subpleural lymph node, unchanged. No  pneumothorax. Musculoskeletal: Mild degenerative changes of the mid thoracic spine. CT ABDOMEN PELVIS FINDINGS Motion degraded images. Hepatobiliary: Unenhanced liver is unremarkable. Layering gallbladder sludge and/or noncalcified gallstones (series 3/image 84). No associated inflammatory changes. No intrahepatic or extrahepatic ductal dilatation. Pancreas: Within normal limits. Spleen: Within normal limits. Adrenals/Urinary Tract: Adrenal glands are within normal limits. Right renal cysts, including a 3.3 cm posterior interpolar right renal cyst (series 3/image 66), poorly evaluated. Left kidney is unremarkable. No hydronephrosis. Thick-walled bladder with perivesical stranding, suggesting cystitis. Indwelling Foley catheter. Stomach/Bowel: Stomach is within normal limits. No evidence of bowel obstruction. Normal appendix (series 3/image 89). Left colonic diverticulosis, without evidence of diverticulitis. Vascular/Lymphatic: No evidence of abdominal aortic aneurysm. No suspicious abdominopelvic lymphadenopathy. Reproductive: Prostatomegaly, suggesting BPH. Other: No abdominopelvic ascites. Musculoskeletal: Mild degenerative changes of the visualized thoracolumbar spine. IMPRESSION: Motion degraded images. Suspected bilateral lower lobe atelectasis. Small right and trace left pleural effusions. Suspected cystitis.  Indwelling Foley catheter. Prostatomegaly, suggesting BPH. Additional ancillary findings as above. Electronically Signed   By: Julian Hy M.D.   On: 06/22/2019 12:38        Scheduled Meds:  amLODipine  5 mg Oral Daily   Chlorhexidine Gluconate Cloth  6 each Topical Daily   divalproex  1,500 mg Oral QHS   donepezil  5 mg Oral QHS   heparin injection (subcutaneous)  5,000 Units Subcutaneous Q8H   rosuvastatin  5 mg Oral QHS   tamsulosin  0.8 mg Oral QHS   Continuous Infusions:  piperacillin-tazobactam (ZOSYN)  IV 3.375 g (06/23/19 0450)          Aline August,  MD [facility] 06/23/2019, 10:20 AM

## 2019-06-23 NOTE — TOC Progression Note (Signed)
Transition of Care [facility]) - Progression Note    Patient Details  Name: KWAKU DENNLER MRN: [id number] Date of Birth: [birth year]  Transition of Care [facility]) CM/SW [place], [place] Phone Number: [phone] 06/23/2019, 9:44 AM  Clinical Narrative:     CSW continues to follow for discharge planning, patient has been accepted at [facility] and son [name] agreeable to this discharge plan once patient is medically stable to dc.    Expected Discharge Plan: [place] Barriers to Discharge: Continued Medical Work up  Expected Discharge Plan and Services Expected Discharge Plan: [place] Acute Care Choice: [place]                                         Social Determinants of Health (SDOH) Interventions    Readmission Risk Interventions No flowsheet data found.

## 2019-06-23 NOTE — Progress Notes (Signed)
Brandy RN reported to me at shift change that the  patient was guarding his right thigh as if something was wrong there. I have  Just observed patient  grimacing and guarding the right thigh during transfer from the bed to the stretcher as if the thigh might have a fracture. Reported this observation  to Janett Billow the receiving  RN to notify the provider for possible X-Ray.

## 2019-06-23 NOTE — Progress Notes (Signed)
Ms. Laurance Flatten from [facility] called for update on Mr. Sledge. Her no. Is [phone]

## 2019-06-23 NOTE — Progress Notes (Signed)
Patient left the floor accompanied by 3 [employer] employees. Patient is more alert and follow simple commands. He was able to follow command for this RN to take his temp orally.

## 2019-06-23 NOTE — Progress Notes (Signed)
Patient is running  temp of 101.6. Tylenol 650 mg suppository adminsimtered . Will continue to monitor.

## 2019-06-23 NOTE — Progress Notes (Signed)
PT Cancellation Note  Patient Details Name: Tony [name] MRN: [id number] DOB: 1951-05-27   Cancelled Treatment:    Reason Eval/Treat Not Completed: Patient at procedure or test/unavailable (LE imaging?). Will follow-up for PT treatment as schedule permits.  Mabeline Caras, PT, DPT Acute Rehabilitation Services  Pager [phone] Office [place] 06/23/2019, 11:38 AM

## 2019-06-23 NOTE — Progress Notes (Signed)
Called to follow up with [employer] as to when they would  be here to pick the patient. I was told there's no truck available right now and that they're running behind. The customer service representative assured me she would let me know when they're on their way. No acute distress noted. Will continue to monitor.

## 2019-06-23 NOTE — Progress Notes (Signed)
  Speech Language Pathology Treatment: Dysphagia  Patient Details Name: Tony [name] MRN: [id number] DOB: 1951-03-03 Today's Date: 06/23/2019 Time: [id number] SLP Time Calculation (min) (ACUTE ONLY): 17 min  Assessment / Plan / Recommendation Clinical Impression  Pt ate a container of jello and almost 8 ounces of juice with cough x2. He still presents with what appears to be primarily a cognitively-based dysphagia. His oral transit is prolonged and at times he needs a liquid wash to help clear his oral cavity. Presenting a straw or a dry spoon seems to help him swallow what is in his mouth as well, and also lets whoever is assisting with feeding to see a little better into his oral cavity for clearance. Given that his mentation may be likely to fluctuate, recommend continuing with current full liquid diet, only to be offered to pt when he is awake and attentive. Would reduce distractions in the room and provide assistance during meals, also making sure that pt is as upright as he can tolerate. Will continue to follow for potential to advance to more solids.    HPI HPI: Pt is a 68 year old male presenting with AMS and fever; admitted with CAUTI sepsis. CXR also showed new RLL opacity. PMH: Alzheimer's dementia, CKD stage 3, diabetes, HTN, seizure disorder, UTIs, indwelling urinary catheter, plans for tentative TURP      SLP Plan  Continue with current plan of care       Recommendations  Diet recommendations: Thin liquid;Other(comment)(full liquid diet) Liquids provided via: Cup;Straw Medication Administration: Crushed with puree Supervision: Staff to assist with self feeding;Full supervision/cueing for compensatory strategies Compensations: Minimize environmental distractions;Slow rate;Small sips/bites;Follow solids with liquid Postural Changes and/or Swallow Maneuvers: Seated upright 90 degrees                Oral Care Recommendations: Oral care BID Follow up Recommendations: 24 hour  supervision/assistance;Skilled Nursing facility SLP Visit Diagnosis: Dysphagia, oral phase (R13.11) Plan: Continue with current plan of care       GO                Venita Sheffield Nickie Deren 06/23/2019, 9:28 AM  Pollyann Glen, M.A. [place] Acute Environmental education officer [phone] Office [phone]

## 2019-06-23 NOTE — Progress Notes (Signed)
Pt alert X 2. Single word answers and repeats some phrases he hears on TV. Full aspiration precautions during feeding. Pt grimaces with pain whenever moved. RLE more so than LLE.

## 2019-06-23 NOTE — Progress Notes (Signed)
  Echocardiogram 2D Echocardiogram has been performed.  Darlina Sicilian M 06/23/2019, 12:51 PM

## 2019-06-24 ENCOUNTER — Inpatient Hospital Stay (HOSPITAL_COMMUNITY): Payer: Medicare (Managed Care)

## 2019-06-24 DIAGNOSIS — M79609 Pain in unspecified limb: Secondary | ICD-10-CM

## 2019-06-24 LAB — CBC WITH DIFFERENTIAL/PLATELET
Abs Immature Granulocytes: 0.26 10*3/uL — ABNORMAL HIGH (ref 0.00–0.07)
Basophils Absolute: 0.1 10*3/uL (ref 0.0–0.1)
Basophils Relative: 0 %
Eosinophils Absolute: 0.2 10*3/uL (ref 0.0–0.5)
Eosinophils Relative: 1 %
HCT: 40.1 % (ref 39.0–52.0)
Hemoglobin: 12.8 g/dL — ABNORMAL LOW (ref 13.0–17.0)
Immature Granulocytes: 2 %
Lymphocytes Relative: 14 %
Lymphs Abs: 1.9 10*3/uL (ref 0.7–4.0)
MCH: 31.8 pg (ref 26.0–34.0)
MCHC: 31.9 g/dL (ref 30.0–36.0)
MCV: 99.5 fL (ref 80.0–100.0)
Monocytes Absolute: 1.8 10*3/uL — ABNORMAL HIGH (ref 0.1–1.0)
Monocytes Relative: 13 %
Neutro Abs: 9.3 10*3/uL — ABNORMAL HIGH (ref 1.7–7.7)
Neutrophils Relative %: 70 %
Platelets: 355 10*3/uL (ref 150–400)
RBC: 4.03 MIL/uL — ABNORMAL LOW (ref 4.22–5.81)
RDW: 12.3 % (ref 11.5–15.5)
WBC: 13.5 10*3/uL — ABNORMAL HIGH (ref 4.0–10.5)
nRBC: 0 % (ref 0.0–0.2)

## 2019-06-24 LAB — COMPREHENSIVE METABOLIC PANEL
ALT: 28 U/L (ref 0–44)
AST: 43 U/L — ABNORMAL HIGH (ref 15–41)
Albumin: 2.1 g/dL — ABNORMAL LOW (ref 3.5–5.0)
Alkaline Phosphatase: 40 U/L (ref 38–126)
Anion gap: 11 (ref 5–15)
BUN: 18 mg/dL (ref 8–23)
CO2: 29 mmol/L (ref 22–32)
Calcium: 9.1 mg/dL (ref 8.9–10.3)
Chloride: 103 mmol/L (ref 98–111)
Creatinine, Ser: 1.52 mg/dL — ABNORMAL HIGH (ref 0.61–1.24)
GFR calc Af Amer: 53 mL/min — ABNORMAL LOW (ref >60)
GFR calc non Af Amer: 46 mL/min — ABNORMAL LOW (ref >60)
Glucose, Bld: 129 mg/dL — ABNORMAL HIGH (ref 70–99)
Potassium: 4.1 mmol/L (ref 3.5–5.1)
Sodium: 143 mmol/L (ref 135–145)
Total Bilirubin: 0.9 mg/dL (ref 0.3–1.2)
Total Protein: 7.1 g/dL (ref 6.5–8.1)

## 2019-06-24 LAB — MAGNESIUM: Magnesium: 2.1 mg/dL (ref 1.7–2.4)

## 2019-06-24 LAB — C-REACTIVE PROTEIN: CRP: 19.9 mg/dL — ABNORMAL HIGH (ref <1.0)

## 2019-06-24 MED ORDER — ADULT MULTIVITAMIN W/MINERALS CH
1.0000 | ORAL_TABLET | Freq: Every day | ORAL | Status: DC
  Administered 2019-06-25 – 2019-06-27 (×3): 1 via ORAL
  Filled 2019-06-24 (×3): qty 1

## 2019-06-24 MED ORDER — OXYCODONE HCL 5 MG PO TABS: 5.0000 mg | ORAL_TABLET | ORAL | Status: DC | PRN

## 2019-06-24 MED ORDER — LORAZEPAM 2 MG/ML IJ SOLN
0.5000 mg | Freq: Once | INTRAMUSCULAR | Status: AC
  Administered 2019-06-24: 0.5 mg via INTRAVENOUS
  Filled 2019-06-24: qty 1

## 2019-06-24 MED ORDER — LORAZEPAM BOLUS VIA INFUSION: 0.5000 mg | Freq: Once | INTRAVENOUS | Status: DC

## 2019-06-24 MED ORDER — ENSURE ENLIVE PO LIQD
237.0000 mL | Freq: Three times a day (TID) | ORAL | Status: DC
  Administered 2019-06-24 – 2019-06-27 (×6): 237 mL via ORAL

## 2019-06-24 MED ORDER — MORPHINE SULFATE (PF) 2 MG/ML IV SOLN
2.0000 mg | INTRAVENOUS | Status: DC | PRN
  Administered 2019-06-24: 2 mg via INTRAVENOUS
  Filled 2019-06-24: qty 1

## 2019-06-24 NOTE — Progress Notes (Addendum)
Initial Nutrition Assessment  DOCUMENTATION CODES:   Not applicable  INTERVENTION:   -Ensure Enlive po TID, each supplement provides 350 kcal and 20 grams of protein -Magic cup TID with meals, each supplement provides 290 kcal and 9 grams of protein -MVI with minerals daily -RD will follow for diet advancement and adjust supplement regimen as appropriate  NUTRITION DIAGNOSIS:   Increased nutrient needs related to wound healing as evidenced by estimated needs.  GOAL:   Patient will meet greater than or equal to 90% of their needs  MONITOR:   PO intake, Supplement acceptance, Diet advancement, Labs, Weight trends, Skin, I & O's  REASON FOR ASSESSMENT:   Low Braden    ASSESSMENT:   68 year old male with history of Alzheimer's dementia, chronic kidney disease stage III, diabetes mellitus type 2, hypertension, seizure disorder, hospitalization from 04/10/2019-04/15/2019 for altered mental status secondary to Enterococcus and procidentia UTI along with urinary retention with subsequent discharge home on Levaquin and indwelling Foley catheter with outpatient plans for TURP by urology on 07/10/2019 presented with subjective fevers and chills and generalized malaise on 06/12/2019.  Foley catheter was changed on 06/13/2019 as per urology recommendations.  Patient spiked a temperature of 102.6 on the evening of 06/19/2019; repeat labs and chest x-ray were obtained which suggested new right lower lobe opacity.  He was transferred to [facility] on 06/22/2019 per family request.  Pt admitted with sepsis secondary to UTI.   11/23- s/p BSE- advanced to full liquids  Spoke with pt at bedside, who was minimally conversant at time of visit. Pt was selective in answering questions, but would usually respond to simple questions. He reports he has a great appetite and that he has been consuming meals. Pt states "See? I ate all of my breakfast here" however, tray was untouched. Meal completion  documented at 10-25%. Pt consuming orange juice at time of visit without difficulty.  Reviewed wt hx; noted pt has experienced a 11% wt loss over the past year, which while not significant for time frame is concerning given multiple comorbidities.   Given full liquid diet restrictions, pt would greatly benefit from addition of oral nutrition supplements to help meet needs and promote wound healing.   Palliative care has been consulted for goals of care.   Labs reviewed.   NUTRITION - FOCUSED PHYSICAL EXAM:    Most Recent Value  Orbital Region  No depletion  Upper Arm Region  No depletion  Thoracic and Lumbar Region  No depletion  Buccal Region  No depletion  Temple Region  No depletion  Clavicle Bone Region  No depletion  Clavicle and Acromion Bone Region  No depletion  Scapular Bone Region  No depletion  Dorsal Hand  No depletion  Patellar Region  Mild depletion  Anterior Thigh Region  Mild depletion  Posterior Calf Region  Mild depletion  Edema (RD Assessment)  None  Hair  Reviewed  Eyes  Reviewed  Mouth  Reviewed  Skin  Reviewed  Nails  Reviewed       Diet Order:   Diet Order            Diet full liquid Room service appropriate? Yes; Fluid consistency: Thin  Diet effective now              EDUCATION NEEDS:   No education needs have been identified at this time  Skin:  Skin Assessment: Skin Integrity Issues: Skin Integrity Issues:: Stage II Stage II: sacrum  Last BM:  06/23/19  Height:   Ht Readings from Last 1 Encounters:  06/23/19 5\' 11"  (1.803 m)    Weight:   Wt Readings from Last 1 Encounters:  06/23/19 91.2 kg    Ideal Body Weight:  78.2 kg  BMI:  Body mass index is 28.04 kg/m.  Estimated Nutritional Needs:   Kcal:  2150-2350  Protein:  115-130 grams  Fluid:  > 2.1 L    Ayaansh Smail A. Jimmye Norman, RD, LDN, [place] Registered Dietitian II Certified Diabetes Care and Education Specialist Pager: [phone] After hours Pager: [phone]

## 2019-06-24 NOTE — Progress Notes (Signed)
Patient returned from MRI sound asleep snoring quiet heavily with periods of 10 second apnea. O2 applied via nasal cannula at 3 liters.  Sats above 90 prior to oxygen

## 2019-06-24 NOTE — Progress Notes (Signed)
Patient via bed to MRI, premedicated with ativan 0.5 mg and morphine 2 mg

## 2019-06-24 NOTE — Progress Notes (Signed)
Patient ID: Tony [name], male   DOB: 17-Nov-1950, 68 y.o.   MRN: [id number]  PROGRESS NOTE    Tony [name]  [id number] DOB: 10-18-1950 DOA: 06/12/2019 PCP: Velna Ochs, MD   Brief Narrative:  68 year old male with history of Alzheimer's dementia, chronic kidney disease stage III, diabetes mellitus type 2, hypertension, seizure disorder, hospitalization from 04/10/2019-04/15/2019 for altered mental status secondary to Enterococcus and procidentia UTI along with urinary retention with subsequent discharge home on Levaquin and indwelling Foley catheter with outpatient plans for TURP by urology on 07/10/2019 presented with subjective fevers and chills and generalized malaise on 06/12/2019.  Foley catheter was changed on 06/13/2019 as per urology recommendations.  Patient spiked a temperature of 102.6 on the evening of 06/19/2019; repeat labs and chest x-ray were obtained which suggested new right lower lobe opacity.  He was transferred to [facility] on 06/22/2019 per family request.  Assessment & Plan:   Sepsis: Present on admission Probable CAUTI in a patient with chronic indwelling Foley catheter: Possibly related to indwelling Foley catheter -Patient was treated with cefepime since admission and subsequently switched to cefdinir. -Urine culture had grown Citrobacter and Enterococcus faecalis.  Foley catheter was changed on 06/13/2019 as per urology recommendations.  Outpatient follow-up with urology for TURP scheduled for 07/10/2019. -Patient started spiking temperatures again from 06/19/2019.  Probable HCAP Fevers -No temperature spikes over the last 24 hours. -Patient was started on Zosyn from 06/20/2019 onwards.  Repeat blood cultures from 06/20/2019 are negative so far.  Blood and urine cultures repeated on 06/22/2019. -CT of the chest/abdomen and pelvis on 06/22/2019 showed bilateral lower lobe atelectasis with small right and trace left pleural effusions and suspected  cystitis. -Diet as per SLP evaluation.  Hip and bilateral thigh pain -Questionable cause.   -X-rays of the hip and femur: Unremarkable.  If continues to have pain, might have to do CT/MRI.  Acute kidney injury on chronic kidney disease stage III -Baseline creatinine 1.5-1.8.  Creatinine peaked at 2.98.  Improving, 1.52 today.  Monitor.  Leukocytosis  -improving.  Monitor  Hypernatremia  -Improved.  Monitor.  Essential hypertension -Continue amlodipine  Seizure disorder -continue Depakote  Alzheimer's dementia Generalized deconditioning -Continue donepezil.  Apparently, brother has noted functional and cognitive decline over the last 6 months. -Palliative care following.  Currently remains full code. -We will need nursing home placement.  BPH -Continue Flomax  Stage II sacral pressure injury: Present on admission -Continue wound care  Coagulase-negative bacteremia -On presentation.  Possibly contaminant repeat blood cultures have been negative so far.    DVT prophylaxis: Heparin Code Status: Full Family Communication: spoke to [name]/brother on phone on 06/23/2019 Disposition Plan:SNF once medically stable  Consultants: Palliative care Urology  Procedures: None  Antimicrobials:  Anti-infectives (From admission, onward)   Start     Dose/Rate Route Frequency Ordered Stop   06/20/19 2000  piperacillin-tazobactam (ZOSYN) IVPB 3.375 g     3.375 g 12.5 mL/hr over 240 Minutes Intravenous Every 8 hours 06/20/19 1102     06/20/19 1200  piperacillin-tazobactam (ZOSYN) IVPB 3.375 g     3.375 g 100 mL/hr over 30 Minutes Intravenous  Once 06/20/19 1125 06/20/19 1628   06/17/19 1500  amoxicillin (AMOXIL) capsule 500 mg  Status:  Discontinued     500 mg Oral Every 8 hours 06/17/19 1449 06/20/19 1102   06/17/19 1500  cefdinir (OMNICEF) capsule 300 mg  Status:  Discontinued     300 mg Oral Every 12 hours 06/17/19 1449  06/20/19 1102   06/17/19 1400  levofloxacin (LEVAQUIN)  tablet 250 mg  Status:  Discontinued     250 mg Oral Daily 06/17/19 1235 06/17/19 1449   06/16/19 2200  amoxicillin-clavulanate (AUGMENTIN) 875-125 MG per tablet 1 tablet  Status:  Discontinued     1 tablet Oral Every 12 hours 06/16/19 1524 06/17/19 1235   06/16/19 1245  cefdinir (OMNICEF) capsule 300 mg  Status:  Discontinued     300 mg Oral Every 12 hours 06/16/19 1231 06/17/19 1235   06/16/19 1245  amoxicillin-clavulanate (AUGMENTIN) 500-125 MG per tablet 500 mg  Status:  Discontinued     1 tablet Oral Every 12 hours 06/16/19 1231 06/16/19 1524   06/14/19 1400  vancomycin (VANCOCIN) IVPB 1000 mg/200 mL premix  Status:  Discontinued     1,000 mg 200 mL/hr over 60 Minutes Intravenous Every 24 hours 06/13/19 1102 06/13/19 1111   06/14/19 0912  ceFEPIme (MAXIPIME) 2 g in sodium chloride 0.9 % 100 mL IVPB  Status:  Discontinued     2 g 200 mL/hr over 30 Minutes Intravenous Every 24 hours 06/13/19 1111 06/16/19 1231   06/13/19 1600  vancomycin (VANCOCIN) IVPB 1000 mg/200 mL premix  Status:  Discontinued     1,000 mg 200 mL/hr over 60 Minutes Intravenous Every 24 hours 06/13/19 1111 06/15/19 1005   06/13/19 1130  vancomycin (VANCOCIN) IVPB 1000 mg/200 mL premix  Status:  Discontinued     1,000 mg 200 mL/hr over 60 Minutes Intravenous Every 1 hr x 2 06/13/19 1102 06/13/19 1109   06/12/19 2200  ceFEPIme (MAXIPIME) 2 g in sodium chloride 0.9 % 100 mL IVPB  Status:  Discontinued     2 g 200 mL/hr over 30 Minutes Intravenous Every 12 hours 06/12/19 1530 06/13/19 1111   06/12/19 1300  vancomycin (VANCOCIN) IVPB 1000 mg/200 mL premix  Status:  Discontinued     1,000 mg 200 mL/hr over 60 Minutes Intravenous Every 1 hr x 2 06/12/19 1215 06/12/19 1431   06/12/19 1215  ceFEPIme (MAXIPIME) 2 g in sodium chloride 0.9 % 100 mL IVPB     2 g 200 mL/hr over 30 Minutes Intravenous  Once 06/12/19 1207 06/12/19 1336   06/12/19 1215  metroNIDAZOLE (FLAGYL) IVPB 500 mg  Status:  Discontinued     500 mg 100  mL/hr over 60 Minutes Intravenous  Once 06/12/19 1207 06/12/19 1442   06/12/19 1215  vancomycin (VANCOCIN) IVPB 1000 mg/200 mL premix  Status:  Discontinued     1,000 mg 200 mL/hr over 60 Minutes Intravenous  Once 06/12/19 1207 06/12/19 1215   06/12/19 1200  cefTRIAXone (ROCEPHIN) 1 g in sodium chloride 0.9 % 100 mL IVPB  Status:  Discontinued     1 g 200 mL/hr over 30 Minutes Intravenous  Once 06/12/19 1154 06/12/19 1207       Subjective: Patient seen and examined at bedside.  Patient is awake but confused.  No overnight fever, vomiting or worsening shortness of breath reported by nursing staff.  Objective: Vitals:   06/23/19 1000 06/23/19 1118 06/23/19 1600 06/23/19 2335  BP:  117/67 108/74 (!) 157/69  Pulse: 93 79 97 99  Resp: 12 14 12 14   Temp:  98.2 F (36.8 C) 99.1 F (37.3 C) 98.9 F (37.2 C)  TempSrc:  Axillary Axillary Oral  SpO2: 97% 97% 97% 96%  Weight:      Height:        Intake/Output Summary (Last 24 hours) at 06/24/2019 0725 Last  data filed at 06/23/2019 2300 Gross per 24 hour  Intake 911.6 ml  Output 626 ml  Net 285.6 ml   Filed Weights   06/12/19 1039 06/23/19 0630  Weight: 97 kg 91.2 kg    Examination:  General exam: No acute distress.  Looks older than stated age.  Looks chronically ill.  Awake but confused to time Respiratory system: Bilateral decreased breath sounds at bases Cardiovascular system: S1 & S2 heard, intermittently tachycardic Gastrointestinal system: Abdomen is nondistended, soft and nontender. Normal bowel sounds heard. Extremities: No cyanosis, clubbing; trace edema   Data Reviewed: I have personally reviewed following labs and imaging studies  CBC: Recent Labs  Lab 06/20/19 0924 06/21/19 0743 06/22/19 0616 06/23/19 0801 06/24/19 0215  WBC 9.4 11.6* 15.0* 13.9* 13.5*  NEUTROABS  --   --   --  9.5* 9.3*  HGB 11.5* 11.5* 12.3* 10.9* 12.8*  HCT 35.4* 34.9* 39.0 33.6* 40.1  MCV 96.5 95.6 99.2 98.0 99.5  PLT 200 244 310  358 [id number]   Basic Metabolic Panel: Recent Labs  Lab 06/19/19 0521 06/20/19 0924 06/21/19 0743 06/22/19 0616 06/23/19 0801 06/24/19 0215  NA 139 141 139 141 143 143  K 3.9 3.7 3.7 3.9 4.3 4.1  CL 103 100 100 99 102 103  CO2 27 31 28 31 31 29   GLUCOSE 126* 134* 154* 127* 146* 129*  BUN 15 18 17 17 20 18   CREATININE 1.32* 1.28* 1.43* 1.53* 1.66* 1.52*  CALCIUM 8.6* 9.2 8.9 8.8* 9.0 9.1  MG 1.8 1.9  --  2.1 2.2 2.1   GFR: Estimated Creatinine Clearance: 53 mL/min (A) (by C-G formula based on SCr of 1.52 mg/dL (H)). Liver Function Tests: Recent Labs  Lab 06/23/19 0801 06/24/19 0215  AST 46* 43*  ALT 29 28  ALKPHOS 35* 40  BILITOT 0.6 0.9  PROT 6.9 7.1  ALBUMIN 2.0* 2.1*   No results for input(s): LIPASE, AMYLASE in the last 168 hours. Recent Labs  Lab 06/19/19 1937  AMMONIA 30   Coagulation Profile: No results for input(s): INR, PROTIME in the last 168 hours. Cardiac Enzymes: No results for input(s): CKTOTAL, CKMB, CKMBINDEX, TROPONINI in the last 168 hours. BNP (last 3 results) No results for input(s): PROBNP in the last 8760 hours. HbA1C: No results for input(s): HGBA1C in the last 72 hours. CBG: Recent Labs  Lab 06/23/19 0742  GLUCAP 145*   Lipid Profile: No results for input(s): CHOL, HDL, LDLCALC, TRIG, CHOLHDL, LDLDIRECT in the last 72 hours. Thyroid Function Tests: No results for input(s): TSH, T4TOTAL, FREET4, T3FREE, THYROIDAB in the last 72 hours. Anemia Panel: No results for input(s): VITAMINB12, FOLATE, FERRITIN, TIBC, IRON, RETICCTPCT in the last 72 hours. Sepsis Labs: Recent Labs  Lab 06/19/19 1937 06/20/19 1248 06/22/19 1345  PROCALCITON 0.21  --  0.24  LATICACIDVEN  --  1.4 1.8    Recent Results (from the past 240 hour(s))  SARS CORONAVIRUS 2 (TAT 6-24 HRS) Nasopharyngeal Nasopharyngeal Swab     Status: None   Collection Time: 06/19/19 11:59 AM   Specimen: Nasopharyngeal Swab  Result Value Ref Range Status   SARS Coronavirus 2  NEGATIVE NEGATIVE Final    Comment: (NOTE) SARS-CoV-2 target nucleic acids are NOT DETECTED. The SARS-CoV-2 RNA is generally detectable in upper and lower respiratory specimens during the acute phase of infection. Negative results do not preclude SARS-CoV-2 infection, do not rule out co-infections with other pathogens, and should not be used as the sole basis for treatment or  other patient management decisions. Negative results must be combined with clinical observations, patient history, and epidemiological information. The expected result is Negative. Fact Sheet for Patients: SugarRoll.be Fact Sheet for Healthcare Providers: https://www.woods-mathews.com/ This test is not yet approved or cleared by the [place] FDA and  has been authorized for detection and/or diagnosis of SARS-CoV-2 by FDA under an Emergency Use Authorization (EUA). This EUA will remain  in effect (meaning this test can be used) for the duration of the COVID-19 declaration under Section 56 4(b)(1) of the Act, 21 U.S.C. section 360bbb-3(b)(1), unless the authorization is terminated or revoked sooner. Performed at [facility], [place] [address] [place], [place] [address]   Culture, Urine     Status: None   Collection Time: 06/19/19  3:24 PM   Specimen: Urine, Clean Catch  Result Value Ref Range Status   Specimen Description   Final    URINE, CLEAN CATCH Performed at [facility], [address] [place], [place] [address]    Special Requests   Final    NONE Performed at [facility], [address] [place], [place] [address]    Culture   Final    NO GROWTH Performed at [facility], [place] [address], [place], [place] [address]    Report Status 06/20/2019 FINAL  Final  Culture, blood (routine x 2)     Status: None (Preliminary result)   Collection Time: 06/20/19 12:48 PM   Specimen: Right Antecubital; Blood  Result Value Ref Range Status   Specimen  Description   Final    RIGHT ANTECUBITAL BOTTLES DRAWN AEROBIC AND ANAEROBIC   Special Requests Blood Culture adequate volume  Final   Culture   Final    NO GROWTH 3 DAYS Performed at [facility], [address] [place], [place] [address]    Report Status PENDING  Incomplete  Culture, blood (routine x 2)     Status: None (Preliminary result)   Collection Time: 06/20/19 12:56 PM   Specimen: BLOOD LEFT WRIST  Result Value Ref Range Status   Specimen Description BLOOD LEFT WRIST BOTTLES DRAWN AEROBIC ONLY  Final   Special Requests Blood Culture adequate volume  Final   Culture   Final    NO GROWTH 3 DAYS Performed at [facility], [address], [place],  [address]    Report Status PENDING  Incomplete  MRSA PCR Screening     Status: None   Collection Time: 06/20/19  4:05 PM   Specimen: Nasal Mucosa; Nasopharyngeal  Result Value Ref Range Status   MRSA by PCR NEGATIVE NEGATIVE Final    Comment:        The GeneXpert MRSA Assay (FDA approved for NASAL specimens only), is one component of a comprehensive MRSA colonization surveillance program. It is not intended to diagnose MRSA infection nor to guide or monitor treatment for MRSA infections. Performed at [facility], [address], [place],  [address]   SARS CORONAVIRUS 2 (TAT 6-24 HRS) Nasopharyngeal Nasopharyngeal Swab     Status: None   Collection Time: 06/21/19  4:10 PM   Specimen: Nasopharyngeal Swab  Result Value Ref Range Status   SARS Coronavirus 2 NEGATIVE NEGATIVE Final    Comment: (NOTE) SARS-CoV-2 target nucleic acids are NOT DETECTED. The SARS-CoV-2 RNA is generally detectable in upper and lower respiratory specimens during the acute phase of infection. Negative results do not preclude SARS-CoV-2 infection, do not rule out co-infections with other pathogens, and should not be used  as the sole basis for treatment or other patient management decisions. Negative results must be combined with  clinical observations, patient history, and epidemiological information. The expected result is Negative. Fact Sheet for Patients: SugarRoll.be Fact Sheet for Healthcare Providers: https://www.woods-mathews.com/ This test is not yet approved or cleared by the [place] FDA and  has been authorized for detection and/or diagnosis of SARS-CoV-2 by FDA under an Emergency Use Authorization (EUA). This EUA will remain  in effect (meaning this test can be used) for the duration of the COVID-19 declaration under Section 56 4(b)(1) of the Act, 21 U.S.C. section 360bbb-3(b)(1), unless the authorization is terminated or revoked sooner. Performed at [facility], [place] [address], [place], [place] [address]   Culture, Urine     Status: None   Collection Time: 06/22/19  9:37 AM   Specimen: Urine, Clean Catch  Result Value Ref Range Status   Specimen Description   Final    URINE, CLEAN CATCH Performed at [facility], [address], [place], [place] [address]    Special Requests   Final    NONE Performed at [facility], [address], [place], [place] [address]    Culture   Final    NO GROWTH Performed at [facility], [place] [address], [place], [place] [address]    Report Status 06/23/2019 FINAL  Final  Culture, blood (routine x 2)     Status: None (Preliminary result)   Collection Time: 06/22/19  1:45 PM   Specimen: BLOOD RIGHT ARM  Result Value Ref Range Status   Specimen Description BLOOD RIGHT ARM  Final   Special Requests   Final    BOTTLES DRAWN AEROBIC AND ANAEROBIC Blood Culture adequate volume   Culture   Final    NO GROWTH < 24 HOURS Performed at [facility], [address] [place], [place] [address]    Report Status PENDING  Incomplete  Culture, blood (routine x 2)     Status: None (Preliminary result)   Collection Time: 06/22/19  1:45 PM   Specimen: BLOOD RIGHT ARM  Result Value Ref Range Status   Specimen  Description BLOOD RIGHT ARM  Final   Special Requests   Final    BOTTLES DRAWN AEROBIC AND ANAEROBIC Blood Culture adequate volume   Culture   Final    NO GROWTH < 24 HOURS Performed at [facility], [address], [place], [place] [address]    Report Status PENDING  Incomplete         Radiology Studies: Ct Abdomen Pelvis Wo Contrast  Result Date: 06/22/2019 CLINICAL DATA:  Fever, sepsis, right lower lobe opacity on chest radiograph. Recent hospitalization for altered mental status secondary to UTI. EXAM: CT CHEST, ABDOMEN AND PELVIS WITHOUT CONTRAST TECHNIQUE: Multidetector CT imaging of the chest, abdomen and pelvis was performed following the standard protocol without IV contrast. COMPARISON:  Chest radiographs dated 06/18/2019. Renal ultrasound dated 06/14/2019. CT chest dated 04/10/2019. FINDINGS: CT CHEST FINDINGS Cardiovascular: Heart is normal in size. No pericardial effusion. No evidence of thoracic aortic aneurysm. Mediastinum/Nodes: No suspicious mediastinal lymphadenopathy. Visualized thyroid is unremarkable. Lungs/Pleura: Small right and trace left pleural effusions. Bilateral lower lobe opacities, likely atelectasis. No frank interstitial edema. Evaluation of the lung parenchyma is constrained by respiratory motion. Within that constraint, there are no suspicious pulmonary nodules. 4 mm triangular subpleural nodule in the posterior left upper lobe (series 4/image 34), favoring a benign subpleural lymph node, unchanged. No pneumothorax. Musculoskeletal: Mild degenerative changes  of the mid thoracic spine. CT ABDOMEN PELVIS FINDINGS Motion degraded images. Hepatobiliary: Unenhanced liver is unremarkable. Layering gallbladder sludge and/or noncalcified gallstones (series 3/image 84). No associated inflammatory changes. No intrahepatic or extrahepatic ductal dilatation. Pancreas: Within normal limits. Spleen: Within normal limits. Adrenals/Urinary Tract: Adrenal glands are within normal  limits. Right renal cysts, including a 3.3 cm posterior interpolar right renal cyst (series 3/image 66), poorly evaluated. Left kidney is unremarkable. No hydronephrosis. Thick-walled bladder with perivesical stranding, suggesting cystitis. Indwelling Foley catheter. Stomach/Bowel: Stomach is within normal limits. No evidence of bowel obstruction. Normal appendix (series 3/image 89). Left colonic diverticulosis, without evidence of diverticulitis. Vascular/Lymphatic: No evidence of abdominal aortic aneurysm. No suspicious abdominopelvic lymphadenopathy. Reproductive: Prostatomegaly, suggesting BPH. Other: No abdominopelvic ascites. Musculoskeletal: Mild degenerative changes of the visualized thoracolumbar spine. IMPRESSION: Motion degraded images. Suspected bilateral lower lobe atelectasis. Small right and trace left pleural effusions. Suspected cystitis.  Indwelling Foley catheter. Prostatomegaly, suggesting BPH. Additional ancillary findings as above. Electronically Signed   By: Julian Hy M.D.   On: 06/22/2019 12:38   Dg Pelvis 1-2 Views  Result Date: 06/23/2019 CLINICAL DATA:  68 year old male with bilateral leg pain. EXAM: PELVIS - 1-2 VIEW COMPARISON:  Bilateral femur radiograph dated 06/23/2019. FINDINGS: There is no acute fracture or dislocation. The bones are mildly osteopenic. No significant arthritic changes. Mild degenerative changes of the lower lumbar spine. The soft tissues are unremarkable. IMPRESSION: No acute fracture or dislocation. Electronically Signed   By: Anner Crete M.D.   On: 06/23/2019 11:54   Ct Chest Wo Contrast  Result Date: 06/22/2019 CLINICAL DATA:  Fever, sepsis, right lower lobe opacity on chest radiograph. Recent hospitalization for altered mental status secondary to UTI. EXAM: CT CHEST, ABDOMEN AND PELVIS WITHOUT CONTRAST TECHNIQUE: Multidetector CT imaging of the chest, abdomen and pelvis was performed following the standard protocol without IV contrast.  COMPARISON:  Chest radiographs dated 06/18/2019. Renal ultrasound dated 06/14/2019. CT chest dated 04/10/2019. FINDINGS: CT CHEST FINDINGS Cardiovascular: Heart is normal in size. No pericardial effusion. No evidence of thoracic aortic aneurysm. Mediastinum/Nodes: No suspicious mediastinal lymphadenopathy. Visualized thyroid is unremarkable. Lungs/Pleura: Small right and trace left pleural effusions. Bilateral lower lobe opacities, likely atelectasis. No frank interstitial edema. Evaluation of the lung parenchyma is constrained by respiratory motion. Within that constraint, there are no suspicious pulmonary nodules. 4 mm triangular subpleural nodule in the posterior left upper lobe (series 4/image 34), favoring a benign subpleural lymph node, unchanged. No pneumothorax. Musculoskeletal: Mild degenerative changes of the mid thoracic spine. CT ABDOMEN PELVIS FINDINGS Motion degraded images. Hepatobiliary: Unenhanced liver is unremarkable. Layering gallbladder sludge and/or noncalcified gallstones (series 3/image 84). No associated inflammatory changes. No intrahepatic or extrahepatic ductal dilatation. Pancreas: Within normal limits. Spleen: Within normal limits. Adrenals/Urinary Tract: Adrenal glands are within normal limits. Right renal cysts, including a 3.3 cm posterior interpolar right renal cyst (series 3/image 66), poorly evaluated. Left kidney is unremarkable. No hydronephrosis. Thick-walled bladder with perivesical stranding, suggesting cystitis. Indwelling Foley catheter. Stomach/Bowel: Stomach is within normal limits. No evidence of bowel obstruction. Normal appendix (series 3/image 89). Left colonic diverticulosis, without evidence of diverticulitis. Vascular/Lymphatic: No evidence of abdominal aortic aneurysm. No suspicious abdominopelvic lymphadenopathy. Reproductive: Prostatomegaly, suggesting BPH. Other: No abdominopelvic ascites. Musculoskeletal: Mild degenerative changes of the visualized  thoracolumbar spine. IMPRESSION: Motion degraded images. Suspected bilateral lower lobe atelectasis. Small right and trace left pleural effusions. Suspected cystitis.  Indwelling Foley catheter. Prostatomegaly, suggesting BPH. Additional ancillary findings as above. Electronically Signed   By: Bertis Ruddy  [place] Pink M.D.   On: 06/22/2019 12:38   Dg Femur Min 2 Views Left  Result Date: 06/23/2019 CLINICAL DATA:  Left upper leg pain of unknown duration. No known injury. EXAM: LEFT FEMUR 2 VIEWS COMPARISON:  None. FINDINGS: There is no evidence of fracture or other focal bone lesions. Mild degenerative change about the left hip and knee noted. Soft tissues are unremarkable. IMPRESSION: No acute abnormality. Mild degenerative disease about the left hip and knee. Electronically Signed   By: Inge Rise M.D.   On: 06/23/2019 11:54   Dg Femur, Min 2 Views Right  Result Date: 06/23/2019 CLINICAL DATA:  Bilateral leg pain. EXAM: RIGHT FEMUR 2 VIEWS COMPARISON:  None. FINDINGS: There is no evidence of fracture or other focal bone lesions. Soft tissues are unremarkable. IMPRESSION: Negative right femur radiographs. Electronically Signed   By: San Morelle M.D.   On: 06/23/2019 11:53        Scheduled Meds:  amLODipine  5 mg Oral Daily   Chlorhexidine Gluconate Cloth  6 each Topical Daily   divalproex  1,500 mg Oral QHS   donepezil  5 mg Oral QHS   heparin injection (subcutaneous)  5,000 Units Subcutaneous Q8H   rosuvastatin  5 mg Oral QHS   tamsulosin  0.8 mg Oral QHS   Continuous Infusions:  dextrose 5 % and 0.45 % NaCl with KCl 20 mEq/L 50 mL/hr at 06/23/19 2100   piperacillin-tazobactam (ZOSYN)  IV 3.375 g (06/24/19 0500)          Aline August, MD [facility] 06/24/2019, 7:25 AM

## 2019-06-24 NOTE — Progress Notes (Signed)
LE venous duplex       has been completed. Preliminary results can be found under CV proc through chart review. Julius Boniface, BS, RDMS, RVT   

## 2019-06-24 NOTE — Progress Notes (Signed)
Physical Therapy Treatment Patient Details Name: Tony [name] MRN: [id number] DOB: [birth year] Today's Date: 06/24/2019    History of Present Illness Pt is a 68 y.o. male admitted 06/12/19 with fever and malaise. Worked up for CAUTI, HCAP, AKI on CKD III. Of note, recent admission 9/11-9/20 with AMS secondary to UTI, d/c home with Foley catheter. Pt with BLE pain; xray of pelvis and bilat femurs negative for acute abnormality; ultrasound negative for DVTs; awaiting MRI. Other PMH includes Alzheimer's dementia, HTN, seizure disorder, stage II pressure injury.   PT Comments    Pt not progressing with mobility. Limited by significant BLE pain and pain "all over" with any passive ROM, requiring totalA for BLE ROM and assist +3-4 for bed mobility and clean up due to bowel incontinence. Pt inconsistently answering questions and following simple commands. Vascular ultrasound negative for DVT; awaiting BLE MRI. Continue to recommend SNF-level therapies.   Follow Up Recommendations  SNF;Supervision for mobility/OOB     Equipment Recommendations  (defer)    Recommendations for Other Services       Precautions / Restrictions Precautions Precautions: Fall Precaution Comments: Bowel incontinence Restrictions Weight Bearing Restrictions: (P) No    Mobility  Bed Mobility Overal bed mobility: Needs Assistance Bed Mobility: Rolling Rolling: Total assist;+2 for physical assistance         General bed mobility comments: Pt dependent for BLE repositioning due to pain. Noted bowel incontinence (pt unaware), pt requiring assist +3-4 for rolling/bed mobility and washup/linen change; pt moaning in pain "everywhere"  Transfers                 General transfer comment: unable  Ambulation/Gait                 Stairs             Wheelchair Mobility    Modified Rankin (Stroke Patients Only)       Balance                                             Cognition Arousal/Alertness: Awake/alert Behavior During Therapy: Flat affect Overall Cognitive Status: History of cognitive impairments - at baseline                                 General Comments: Pt answering simple questions, following some commands; not consistently attending to therapist or answering quetsions      Exercises Other Exercises Other Exercises: pt received turned onto L-side with hips/knees flexed and pillow between knees; passive hip/knee extension (increased time due to significant pain), pt left to encourage midline/neutral posture with bilat hip/knee extension, neutral hip rotation    General Comments        Pertinent Vitals/Pain Pain Assessment: Faces Faces Pain Scale: Hurts worst Pain Location: BLE pain with any palpation and repositioning; pt reports pain "all over" Pain Descriptors / Indicators: Grimacing;Guarding;Moaning Pain Intervention(s): Premedicated before session;Repositioned;Limited activity within patient's tolerance    Home Living                      Prior Function            PT Goals (current goals can now be found in the care plan section) Progress towards PT goals: Not progressing toward goals - comment;Goals downgraded-see  care plan    Frequency    Min 2X/week      PT Plan Current plan remains appropriate    Co-evaluation              AM-PAC PT "6 Clicks" Mobility   Outcome Measure  Help needed turning from your back to your side while in a flat bed without using bedrails?: Total Help needed moving from lying on your back to sitting on the side of a flat bed without using bedrails?: Total Help needed moving to and from a bed to a chair (including a wheelchair)?: Total Help needed standing up from a chair using your arms (e.g., wheelchair or bedside chair)?: Total Help needed to walk in hospital room?: Total Help needed climbing 3-5 steps with a railing? : Total 6 Click Score: 6    End  of Session   Activity Tolerance: Patient limited by pain Patient left: in bed;with call bell/phone within reach;with bed alarm set Nurse Communication: Mobility status PT Visit Diagnosis: Unsteadiness on feet (R26.81);Other abnormalities of gait and mobility (R26.89);Muscle weakness (generalized) (M62.81);Pain     Time: [id number] PT Time Calculation (min) (ACUTE ONLY): 22 min  Charges:  $Therapeutic Activity: 8-22 mins                    Mabeline Caras, PT, DPT Acute Rehabilitation Services  Pager [phone] Office [place] 06/24/2019, 2:44 PM

## 2019-06-25 ENCOUNTER — Inpatient Hospital Stay (HOSPITAL_COMMUNITY): Payer: Medicare (Managed Care)

## 2019-06-25 LAB — CBC WITH DIFFERENTIAL/PLATELET
Abs Immature Granulocytes: 0.16 10*3/uL — ABNORMAL HIGH (ref 0.00–0.07)
Basophils Absolute: 0.1 10*3/uL (ref 0.0–0.1)
Basophils Relative: 0 %
Eosinophils Absolute: 0.3 10*3/uL (ref 0.0–0.5)
Eosinophils Relative: 2 %
HCT: 31.7 % — ABNORMAL LOW (ref 39.0–52.0)
Hemoglobin: 10.3 g/dL — ABNORMAL LOW (ref 13.0–17.0)
Immature Granulocytes: 1 %
Lymphocytes Relative: 15 %
Lymphs Abs: 2.1 10*3/uL (ref 0.7–4.0)
MCH: 31.8 pg (ref 26.0–34.0)
MCHC: 32.5 g/dL (ref 30.0–36.0)
MCV: 97.8 fL (ref 80.0–100.0)
Monocytes Absolute: 1.6 10*3/uL — ABNORMAL HIGH (ref 0.1–1.0)
Monocytes Relative: 12 %
Neutro Abs: 9.4 10*3/uL — ABNORMAL HIGH (ref 1.7–7.7)
Neutrophils Relative %: 70 %
Platelets: 420 10*3/uL — ABNORMAL HIGH (ref 150–400)
RBC: 3.24 MIL/uL — ABNORMAL LOW (ref 4.22–5.81)
RDW: 11.9 % (ref 11.5–15.5)
WBC: 13.6 10*3/uL — ABNORMAL HIGH (ref 4.0–10.5)
nRBC: 0 % (ref 0.0–0.2)

## 2019-06-25 LAB — BASIC METABOLIC PANEL
Anion gap: 8 (ref 5–15)
BUN: 12 mg/dL (ref 8–23)
CO2: 32 mmol/L (ref 22–32)
Calcium: 8.8 mg/dL — ABNORMAL LOW (ref 8.9–10.3)
Chloride: 101 mmol/L (ref 98–111)
Creatinine, Ser: 1.43 mg/dL — ABNORMAL HIGH (ref 0.61–1.24)
GFR calc Af Amer: 58 mL/min — ABNORMAL LOW (ref >60)
GFR calc non Af Amer: 50 mL/min — ABNORMAL LOW (ref >60)
Glucose, Bld: 135 mg/dL — ABNORMAL HIGH (ref 70–99)
Potassium: 4.2 mmol/L (ref 3.5–5.1)
Sodium: 141 mmol/L (ref 135–145)

## 2019-06-25 LAB — CULTURE, BLOOD (ROUTINE X 2)
Culture: NO GROWTH
Culture: NO GROWTH
Special Requests: ADEQUATE
Special Requests: ADEQUATE

## 2019-06-25 LAB — SARS CORONAVIRUS 2 (TAT 6-24 HRS): SARS Coronavirus 2: NEGATIVE

## 2019-06-25 LAB — MAGNESIUM: Magnesium: 2 mg/dL (ref 1.7–2.4)

## 2019-06-25 MED ORDER — SODIUM CHLORIDE 0.9 % IV SOLN
INTRAVENOUS | Status: DC
  Administered 2019-06-25 – 2019-06-26 (×2): via INTRAVENOUS

## 2019-06-25 MED ORDER — IOHEXOL 350 MG/ML SOLN
100.0000 mL | Freq: Once | INTRAVENOUS | Status: AC | PRN
  Administered 2019-06-25: 100 mL via INTRAVENOUS

## 2019-06-25 NOTE — Progress Notes (Signed)
Patient ID: Tony [name], male   DOB: [birth year], 68 y.o.   MRN: [id number]  PROGRESS NOTE    Tony [name]  [id number] DOB: June 03, 1951 DOA: 06/12/2019 PCP: Velna Ochs, MD   Brief Narrative:  68 year old male with history of Alzheimer's dementia, chronic kidney disease stage III, diabetes mellitus type 2, hypertension, seizure disorder, hospitalization from 04/10/2019-04/15/2019 for altered mental status secondary to Enterococcus and procidentia UTI along with urinary retention with subsequent discharge home on Levaquin and indwelling Foley catheter with outpatient plans for TURP by urology on 07/10/2019 presented with subjective fevers and chills and generalized malaise on 06/12/2019.  Foley catheter was changed on 06/13/2019 as per urology recommendations.  Patient spiked a temperature of 102.6 on the evening of 06/19/2019; repeat labs and chest x-ray were obtained which suggested new right lower lobe opacity.  He was transferred to [facility] on 06/22/2019 per family request.  Assessment & Plan:   Sepsis: Present on admission Probable CAUTI in a patient with chronic indwelling Foley catheter: Possibly related to indwelling Foley catheter -Patient was treated with cefepime since admission and subsequently switched to cefdinir. -Urine culture had grown Citrobacter and Enterococcus faecalis.  Foley catheter was changed on 06/13/2019 as per urology recommendations.  Outpatient follow-up with urology for TURP scheduled for 07/10/2019. -Patient started spiking temperatures again from 06/19/2019.  Probable HCAP Fevers -No temperature spikes over the last 48 hours. -Patient was started on Zosyn from 06/20/2019 onwards.  Repeat blood cultures from 06/20/2019 are negative so far.  Blood and urine cultures repeated on 06/22/2019. -CT of the chest/abdomen and pelvis on 06/22/2019 showed bilateral lower lobe atelectasis with small right and trace left pleural effusions and suspected  cystitis. -Diet as per SLP evaluation.  Hip and bilateral thigh pain -X-rays of the hip and femur: Unremarkable.   -MRI of the hip and femurs show features of extensive myositis.  Spoke to Dr. Swinteck/orthopedics who has evaluated the patient and recommend conservative management.  He recommended CT of the pelvis with contrast to rule out any venous obstruction causing edema in the gluteal muscles.  Acute kidney injury on chronic kidney disease stage III -Baseline creatinine 1.5-1.8.  Creatinine peaked at 2.98.  Improving, 1.43 today.  Monitor.  Leukocytosis  -Persistent.  Monitor  Hypernatremia  -Improved.  Monitor.  Essential hypertension -Continue amlodipine  Seizure disorder -continue Depakote  Alzheimer's dementia Generalized deconditioning -Continue donepezil.  Apparently, brother has noted functional and cognitive decline over the last 6 months. -Palliative care following.  Currently remains full code. -We will need nursing home placement.  BPH -Continue Flomax  Stage II sacral pressure injury: Present on admission -Continue wound care  Coagulase-negative bacteremia -On presentation.  Possibly contaminant repeat blood cultures have been negative so far.    DVT prophylaxis: Heparin Code Status: Full Family Communication: spoke to [name]/brother on phone on 06/25/2019 Disposition Plan:SNF once medically stable  Consultants: Palliative care Urology  Procedures: None  Antimicrobials:  Anti-infectives (From admission, onward)   Start     Dose/Rate Route Frequency Ordered Stop   06/20/19 2000  piperacillin-tazobactam (ZOSYN) IVPB 3.375 g     3.375 g 12.5 mL/hr over 240 Minutes Intravenous Every 8 hours 06/20/19 1102     06/20/19 1200  piperacillin-tazobactam (ZOSYN) IVPB 3.375 g     3.375 g 100 mL/hr over 30 Minutes Intravenous  Once 06/20/19 1125 06/20/19 1628   06/17/19 1500  amoxicillin (AMOXIL) capsule 500 mg  Status:  Discontinued     500 mg Oral  Every 8  hours 06/17/19 1449 06/20/19 1102   06/17/19 1500  cefdinir (OMNICEF) capsule 300 mg  Status:  Discontinued     300 mg Oral Every 12 hours 06/17/19 1449 06/20/19 1102   06/17/19 1400  levofloxacin (LEVAQUIN) tablet 250 mg  Status:  Discontinued     250 mg Oral Daily 06/17/19 1235 06/17/19 1449   06/16/19 2200  amoxicillin-clavulanate (AUGMENTIN) 875-125 MG per tablet 1 tablet  Status:  Discontinued     1 tablet Oral Every 12 hours 06/16/19 1524 06/17/19 1235   06/16/19 1245  cefdinir (OMNICEF) capsule 300 mg  Status:  Discontinued     300 mg Oral Every 12 hours 06/16/19 1231 06/17/19 1235   06/16/19 1245  amoxicillin-clavulanate (AUGMENTIN) 500-125 MG per tablet 500 mg  Status:  Discontinued     1 tablet Oral Every 12 hours 06/16/19 1231 06/16/19 1524   06/14/19 1400  vancomycin (VANCOCIN) IVPB 1000 mg/200 mL premix  Status:  Discontinued     1,000 mg 200 mL/hr over 60 Minutes Intravenous Every 24 hours 06/13/19 1102 06/13/19 1111   06/14/19 0912  ceFEPIme (MAXIPIME) 2 g in sodium chloride 0.9 % 100 mL IVPB  Status:  Discontinued     2 g 200 mL/hr over 30 Minutes Intravenous Every 24 hours 06/13/19 1111 06/16/19 1231   06/13/19 1600  vancomycin (VANCOCIN) IVPB 1000 mg/200 mL premix  Status:  Discontinued     1,000 mg 200 mL/hr over 60 Minutes Intravenous Every 24 hours 06/13/19 1111 06/15/19 1005   06/13/19 1130  vancomycin (VANCOCIN) IVPB 1000 mg/200 mL premix  Status:  Discontinued     1,000 mg 200 mL/hr over 60 Minutes Intravenous Every 1 hr x 2 06/13/19 1102 06/13/19 1109   06/12/19 2200  ceFEPIme (MAXIPIME) 2 g in sodium chloride 0.9 % 100 mL IVPB  Status:  Discontinued     2 g 200 mL/hr over 30 Minutes Intravenous Every 12 hours 06/12/19 1530 06/13/19 1111   06/12/19 1300  vancomycin (VANCOCIN) IVPB 1000 mg/200 mL premix  Status:  Discontinued     1,000 mg 200 mL/hr over 60 Minutes Intravenous Every 1 hr x 2 06/12/19 1215 06/12/19 1431   06/12/19 1215  ceFEPIme (MAXIPIME) 2 g in  sodium chloride 0.9 % 100 mL IVPB     2 g 200 mL/hr over 30 Minutes Intravenous  Once 06/12/19 1207 06/12/19 1336   06/12/19 1215  metroNIDAZOLE (FLAGYL) IVPB 500 mg  Status:  Discontinued     500 mg 100 mL/hr over 60 Minutes Intravenous  Once 06/12/19 1207 06/12/19 1442   06/12/19 1215  vancomycin (VANCOCIN) IVPB 1000 mg/200 mL premix  Status:  Discontinued     1,000 mg 200 mL/hr over 60 Minutes Intravenous  Once 06/12/19 1207 06/12/19 1215   06/12/19 1200  cefTRIAXone (ROCEPHIN) 1 g in sodium chloride 0.9 % 100 mL IVPB  Status:  Discontinued     1 g 200 mL/hr over 30 Minutes Intravenous  Once 06/12/19 1154 06/12/19 1207       Subjective: Patient seen and examined at bedside.  No overnight fever, worsening shortness of breath or vomiting reported.  Pleasantly confused.  Poor historian.  Objective: Vitals:   06/25/19 0010 06/25/19 0300 06/25/19 0319 06/25/19 0604  BP: 107/66  118/66   Pulse: 95 86 83 85  Resp:      Temp: 99.8 F (37.7 C)  99.9 F (37.7 C) 99 F (37.2 C)  TempSrc: Oral  Oral Oral  SpO2: 100% 100% 100% 100%  Weight:      Height:        Intake/Output Summary (Last 24 hours) at 06/25/2019 0748 Last data filed at 06/25/2019 0011 Gross per 24 hour  Intake 50 ml  Output 1000 ml  Net -950 ml   Filed Weights   06/12/19 1039 06/23/19 0630  Weight: 97 kg 91.2 kg    Examination:  General exam: No distress.  Looks older than stated age.  Looks chronically ill.  Awake but confused to time.  Poor historian. Respiratory system: Bilateral decreased breath sounds at bases, some scattered crackles Cardiovascular system: S1 & S2 heard, rate controlled Gastrointestinal system: Abdomen is nondistended, soft and nontender. Normal bowel sounds heard. Extremities: No cyanosis, clubbing; trace edema.    Data Reviewed: I have personally reviewed following labs and imaging studies  CBC: Recent Labs  Lab 06/21/19 0743 06/22/19 0616 06/23/19 0801 06/24/19 0215  06/25/19 0219  WBC 11.6* 15.0* 13.9* 13.5* 13.6*  NEUTROABS  --   --  9.5* 9.3* 9.4*  HGB 11.5* 12.3* 10.9* 12.8* 10.3*  HCT 34.9* 39.0 33.6* 40.1 31.7*  MCV 95.6 99.2 98.0 99.5 97.8  PLT 244 310 358 355 0000000*   Basic Metabolic Panel: Recent Labs  Lab 06/20/19 0924 06/21/19 0743 06/22/19 0616 06/23/19 0801 06/24/19 0215 06/25/19 0219  NA 141 139 141 143 143 141  K 3.7 3.7 3.9 4.3 4.1 4.2  CL 100 100 99 102 103 101  CO2 31 28 31 31 29  32  GLUCOSE 134* 154* 127* 146* 129* 135*  BUN 18 17 17 20 18 12   CREATININE 1.28* 1.43* 1.53* 1.66* 1.52* 1.43*  CALCIUM 9.2 8.9 8.8* 9.0 9.1 8.8*  MG 1.9  --  2.1 2.2 2.1 2.0   GFR: Estimated Creatinine Clearance: 56.3 mL/min (A) (by C-G formula based on SCr of 1.43 mg/dL (H)). Liver Function Tests: Recent Labs  Lab 06/23/19 0801 06/24/19 0215  AST 46* 43*  ALT 29 28  ALKPHOS 35* 40  BILITOT 0.6 0.9  PROT 6.9 7.1  ALBUMIN 2.0* 2.1*   No results for input(s): LIPASE, AMYLASE in the last 168 hours. Recent Labs  Lab 06/19/19 1937  AMMONIA 30   Coagulation Profile: No results for input(s): INR, PROTIME in the last 168 hours. Cardiac Enzymes: No results for input(s): CKTOTAL, CKMB, CKMBINDEX, TROPONINI in the last 168 hours. BNP (last 3 results) No results for input(s): PROBNP in the last 8760 hours. HbA1C: No results for input(s): HGBA1C in the last 72 hours. CBG: Recent Labs  Lab 06/23/19 0742  GLUCAP 145*   Lipid Profile: No results for input(s): CHOL, HDL, LDLCALC, TRIG, CHOLHDL, LDLDIRECT in the last 72 hours. Thyroid Function Tests: No results for input(s): TSH, T4TOTAL, FREET4, T3FREE, THYROIDAB in the last 72 hours. Anemia Panel: No results for input(s): VITAMINB12, FOLATE, FERRITIN, TIBC, IRON, RETICCTPCT in the last 72 hours. Sepsis Labs: Recent Labs  Lab 06/19/19 1937 06/20/19 1248 06/22/19 1345  PROCALCITON 0.21  --  0.24  LATICACIDVEN  --  1.4 1.8    Recent Results (from the past 240 hour(s))  SARS  CORONAVIRUS 2 (TAT 6-24 HRS) Nasopharyngeal Nasopharyngeal Swab     Status: None   Collection Time: 06/19/19 11:59 AM   Specimen: Nasopharyngeal Swab  Result Value Ref Range Status   SARS Coronavirus 2 NEGATIVE NEGATIVE Final    Comment: (NOTE) SARS-CoV-2 target nucleic acids are NOT DETECTED. The SARS-CoV-2 RNA is generally detectable in upper and lower respiratory specimens  during the acute phase of infection. Negative results do not preclude SARS-CoV-2 infection, do not rule out co-infections with other pathogens, and should not be used as the sole basis for treatment or other patient management decisions. Negative results must be combined with clinical observations, patient history, and epidemiological information. The expected result is Negative. Fact Sheet for Patients: SugarRoll.be Fact Sheet for Healthcare Providers: https://www.woods-mathews.com/ This test is not yet approved or cleared by the [place] FDA and  has been authorized for detection and/or diagnosis of SARS-CoV-2 by FDA under an Emergency Use Authorization (EUA). This EUA will remain  in effect (meaning this test can be used) for the duration of the COVID-19 declaration under Section 56 4(b)(1) of the Act, 21 U.S.C. section 360bbb-3(b)(1), unless the authorization is terminated or revoked sooner. Performed at [facility], [place] [address] [place], [place] [address]   Culture, Urine     Status: None   Collection Time: 06/19/19  3:24 PM   Specimen: Urine, Clean Catch  Result Value Ref Range Status   Specimen Description   Final    URINE, CLEAN CATCH Performed at [facility], [address], [place], [place] [address]    Special Requests   Final    NONE Performed at [facility], [address], [place], [place] [address]    Culture   Final    NO GROWTH Performed at [facility], [place] [address], [place], [place] [address]    Report Status  06/20/2019 FINAL  Final  Culture, blood (routine x 2)     Status: None (Preliminary result)   Collection Time: 06/20/19 12:48 PM   Specimen: Right Antecubital; Blood  Result Value Ref Range Status   Specimen Description   Final    RIGHT ANTECUBITAL BOTTLES DRAWN AEROBIC AND ANAEROBIC   Special Requests Blood Culture adequate volume  Final   Culture   Final    NO GROWTH 4 DAYS Performed at [facility], [address] [place], [place] [address]    Report Status PENDING  Incomplete  Culture, blood (routine x 2)     Status: None (Preliminary result)   Collection Time: 06/20/19 12:56 PM   Specimen: BLOOD LEFT WRIST  Result Value Ref Range Status   Specimen Description BLOOD LEFT WRIST BOTTLES DRAWN AEROBIC ONLY  Final   Special Requests Blood Culture adequate volume  Final   Culture   Final    NO GROWTH 4 DAYS Performed at [facility], [address], [place], [place] [address]    Report Status PENDING  Incomplete  MRSA PCR Screening     Status: None   Collection Time: 06/20/19  4:05 PM   Specimen: Nasal Mucosa; Nasopharyngeal  Result Value Ref Range Status   MRSA by PCR NEGATIVE NEGATIVE Final    Comment:        The GeneXpert MRSA Assay (FDA approved for NASAL specimens only), is one component of a comprehensive MRSA colonization surveillance program. It is not intended to diagnose MRSA infection nor to guide or monitor treatment for MRSA infections. Performed at [facility], [address], [place], [place] [address]   SARS CORONAVIRUS 2 (TAT 6-24 HRS) Nasopharyngeal Nasopharyngeal Swab     Status: None   Collection Time: 06/21/19  4:10 PM   Specimen: Nasopharyngeal Swab  Result Value Ref Range Status   SARS Coronavirus 2 NEGATIVE NEGATIVE Final    Comment: (NOTE) SARS-CoV-2 target nucleic acids are NOT DETECTED. The SARS-CoV-2 RNA is generally  detectable in upper and lower respiratory specimens during the acute phase of infection. Negative results do not preclude  SARS-CoV-2 infection, do not rule out co-infections with other pathogens, and should not be used as the sole basis for treatment or other patient management decisions. Negative results must be combined with clinical observations, patient history, and epidemiological information. The expected result is Negative. Fact Sheet for Patients: SugarRoll.be Fact Sheet for Healthcare Providers: https://www.woods-mathews.com/ This test is not yet approved or cleared by the [place] FDA and  has been authorized for detection and/or diagnosis of SARS-CoV-2 by FDA under an Emergency Use Authorization (EUA). This EUA will remain  in effect (meaning this test can be used) for the duration of the COVID-19 declaration under Section 56 4(b)(1) of the Act, 21 U.S.C. section 360bbb-3(b)(1), unless the authorization is terminated or revoked sooner. Performed at [facility], [place] [address] [place], [place] [address]   Culture, Urine     Status: None   Collection Time: 06/22/19  9:37 AM   Specimen: Urine, Clean Catch  Result Value Ref Range Status   Specimen Description   Final    URINE, CLEAN CATCH Performed at [facility], [address] [place], [place] [address]    Special Requests   Final    NONE Performed at [facility], [address] [place], [place] [address]    Culture   Final    NO GROWTH Performed at [facility], [place] [address], [place], [place] [address]    Report Status 06/23/2019 FINAL  Final  Culture, blood (routine x 2)     Status: None (Preliminary result)   Collection Time: 06/22/19  1:45 PM   Specimen: BLOOD RIGHT ARM  Result Value Ref Range Status   Specimen Description BLOOD RIGHT ARM  Final   Special Requests   Final    BOTTLES DRAWN AEROBIC AND ANAEROBIC Blood Culture adequate volume   Culture   Final    NO GROWTH 2 DAYS Performed at [facility], [address] [place], [place] [address]    Report Status  PENDING  Incomplete  Culture, blood (routine x 2)     Status: None (Preliminary result)   Collection Time: 06/22/19  1:45 PM   Specimen: BLOOD RIGHT ARM  Result Value Ref Range Status   Specimen Description BLOOD RIGHT ARM  Final   Special Requests   Final    BOTTLES DRAWN AEROBIC AND ANAEROBIC Blood Culture adequate volume   Culture   Final    NO GROWTH 2 DAYS Performed at [facility], [address] [place], [place] [address]    Report Status PENDING  Incomplete         Radiology Studies: Dg Pelvis 1-2 Views  Result Date: 06/23/2019 CLINICAL DATA:  68 year old male with bilateral leg pain. EXAM: PELVIS - 1-2 VIEW COMPARISON:  Bilateral femur radiograph dated 06/23/2019. FINDINGS: There is no acute fracture or dislocation. The bones are mildly osteopenic. No significant arthritic changes. Mild degenerative changes of the lower lumbar spine. The soft tissues are unremarkable. IMPRESSION: No acute fracture or dislocation. Electronically Signed   By: Anner Crete M.D.   On: 06/23/2019 11:54   Mr Frmur Right Wo Contrast  Result Date: 06/24/2019 CLINICAL DATA:  Right thigh pain. EXAM: MRI OF THE RIGHT FEMUR WITHOUT CONTRAST TECHNIQUE: Multiplanar, multisequence MR imaging of the right femur was performed. No intravenous contrast was administered. COMPARISON:  None. FINDINGS: Bones/Joint/Cartilage Right femur and right hip joint appear  normal. There is a prominent right knee effusion. Muscles and Tendons There is extensive edema in the anterior and posterior muscles of the right thigh. There is fluid deep to the iliotibial band. Soft tissues No discrete abscesses. IMPRESSION: Extensive myositis of the right thigh. Prominent nonspecific right knee effusion. No bone abnormality. Electronically Signed   By: Lorriane Shire M.D.   On: 06/24/2019 18:20   Mr Femur Left Wo Contrast  Result Date: 06/24/2019 CLINICAL DATA:  Left thigh pain. EXAM: MR OF THE LEFT FEMUR WITHOUT CONTRAST  TECHNIQUE: Multiplanar, multisequence MR imaging of the left femur was performed. No intravenous contrast was administered. COMPARISON:  None. FINDINGS: Bones/Joint/Cartilage The left femur appears normal. No evidence of osteomyelitis. Muscles and Tendons There is extensive edema in and around the muscles of the left thigh, most extensive in the origin of vastus lateralis muscle. There is also subcutaneous edema in the left thigh. Edema around the adductor muscles of the left hip. Small left knee effusion. Soft tissues Edema in the subcutaneous fat of the lateral aspect of the thigh. IMPRESSION: 1. No evidence of osteomyelitis or soft tissue abscess. 2. Extensive edema in and around the muscles of the left thigh, most extensive in the origin of the vastus lateralis muscle. 3. Small left knee effusion. Electronically Signed   By: Lorriane Shire M.D.   On: 06/24/2019 18:18   Mr Hip Right Wo Contrast  Result Date: 06/24/2019 CLINICAL DATA:  Bilateral hip and thigh pain. Sepsis. EXAM: MR OF THE RIGHT HIP WITHOUT CONTRAST TECHNIQUE: Multiplanar, multisequence MR imaging was performed. No intravenous contrast was administered. COMPARISON:  Radiographs dated 06/23/2019 FINDINGS: Bones: Bones of the right hip in the visualized pelvic bones appear normal. Articular cartilage and labrum Articular cartilage:  Normal. Labrum:  Normal. Joint or bursal effusion Joint effusion:  Small joint effusion. Bursae: Fluid in the greater trochanteric bursa. This appears to originate from the gluteal muscles. Small amount of fluid deep to the iliotibial band distal to the right greater trochanter. Muscles and tendons Muscles and tendons: There is prominent edema in right gluteal muscles as well as in and around the hamstring muscles very prominent edema in the right internal obturator muscle. There is also edema around the adductor muscles. There is slight edema along the fascial planes of the muscles of the anterior thigh including  the biceps femoris. Other findings Miscellaneous: No adenopathy. No definable abscesses. There is a small amount of ascites in the presacral space and around the bladder. IMPRESSION: 1. Extensive edema in the right gluteal muscles right thigh muscles and right internal obturator muscle. This is consistent with extensive myositis. No definable gas in the soft tissues. 2. Small amount of fluid deep to the iliotibial band distal to the right greater trochanter likely related to the adjacent myositis. 3. Small amount of ascites in the presacral space and around the bladder. Electronically Signed   By: Lorriane Shire M.D.   On: 06/24/2019 18:13   Mr Hip Left Wo Contrast  Result Date: 06/24/2019 CLINICAL DATA:  Left hip and thigh pain. EXAM: MR OF THE LEFT HIP WITHOUT CONTRAST TECHNIQUE: Multiplanar, multisequence MR imaging was performed. No intravenous contrast was administered. COMPARISON:  Radiographs dated 06/23/2019 FINDINGS: Bones: The bones of the pelvis and left hip appear normal. Articular cartilage and labrum Articular cartilage:  Normal. Labrum:  Normal. Joint or bursal effusion Joint effusion:  Small joint effusion. Bursae: No bursitis. Fluid deep to the iliotibial band adjacent to the vastus lateralis muscle. Muscles  and tendons Muscles and tendons: Extensive edema in the muscles around the hip petechial E in the proximal vastus lateralis muscle and along the fascial planes in the anterior aspect of the left thigh. Patchy edema in the left gluteal muscles. Slight edema in and around the abductor muscles of the left hip. Other findings Miscellaneous:   No discrete abscesses.  No adenopathy. IMPRESSION: Extensive myositis of the left buttock and left thigh. No discrete abscess. No air in the soft tissues to suggest necrotizing fasciitis at this time. Electronically Signed   By: Lorriane Shire M.D.   On: 06/24/2019 18:16   Dg Femur Min 2 Views Left  Result Date: 06/23/2019 CLINICAL DATA:  Left upper  leg pain of unknown duration. No known injury. EXAM: LEFT FEMUR 2 VIEWS COMPARISON:  None. FINDINGS: There is no evidence of fracture or other focal bone lesions. Mild degenerative change about the left hip and knee noted. Soft tissues are unremarkable. IMPRESSION: No acute abnormality. Mild degenerative disease about the left hip and knee. Electronically Signed   By: Inge Rise M.D.   On: 06/23/2019 11:54   Dg Femur, Min 2 Views Right  Result Date: 06/23/2019 CLINICAL DATA:  Bilateral leg pain. EXAM: RIGHT FEMUR 2 VIEWS COMPARISON:  None. FINDINGS: There is no evidence of fracture or other focal bone lesions. Soft tissues are unremarkable. IMPRESSION: Negative right femur radiographs. Electronically Signed   By: San Morelle M.D.   On: 06/23/2019 11:53   Vas [place] Lower Extremity Venous (dvt)  Result Date: 06/24/2019  Lower Venous Study Indications: Right > Left leg/ hip pain.  Limitations: Patient movement/ confusion. Comparison Study: no prior Performing Technologist: June Leap RDMS, RVT  Examination Guidelines: A complete evaluation includes B-mode imaging, spectral Doppler, color Doppler, and power Doppler as needed of all accessible portions of each vessel. Bilateral testing is considered an integral part of a complete examination. Limited examinations for reoccurring indications may be performed as noted.  +---------+---------------+---------+-----------+----------+--------------+  RIGHT     Compressibility Phasicity Spontaneity Properties Thrombus Aging  +---------+---------------+---------+-----------+----------+--------------+  CFV       Full            Yes       Yes                                    +---------+---------------+---------+-----------+----------+--------------+  SFJ       Full                                                             +---------+---------------+---------+-----------+----------+--------------+  FV Prox   Full                                                              +---------+---------------+---------+-----------+----------+--------------+  FV Mid    Full                                                             +---------+---------------+---------+-----------+----------+--------------+  FV Distal Full                                                             +---------+---------------+---------+-----------+----------+--------------+  PFV       Full                                                             +---------+---------------+---------+-----------+----------+--------------+  POP       Full            Yes       Yes                                    +---------+---------------+---------+-----------+----------+--------------+  PTV       Full                                                             +---------+---------------+---------+-----------+----------+--------------+  PERO      Full                                                             +---------+---------------+---------+-----------+----------+--------------+   +---------+---------------+---------+-----------+----------+--------------+  LEFT      Compressibility Phasicity Spontaneity Properties Thrombus Aging  +---------+---------------+---------+-----------+----------+--------------+  CFV       Full            Yes       Yes                                    +---------+---------------+---------+-----------+----------+--------------+  SFJ       Full                                                             +---------+---------------+---------+-----------+----------+--------------+  FV Prox   Full                                                             +---------+---------------+---------+-----------+----------+--------------+  FV Mid    Full                                                             +---------+---------------+---------+-----------+----------+--------------+  FV Distal Full                                                              +---------+---------------+---------+-----------+----------+--------------+  PFV       Full                                                             +---------+---------------+---------+-----------+----------+--------------+  POP       Full            Yes       Yes                                    +---------+---------------+---------+-----------+----------+--------------+  PTV       Full                                                             +---------+---------------+---------+-----------+----------+--------------+  PERO      Full                                                             +---------+---------------+---------+-----------+----------+--------------+     Summary: Right: There is no evidence of deep vein thrombosis in the lower extremity. No cystic structure found in the popliteal fossa. Left: There is no evidence of deep vein thrombosis in the lower extremity. No cystic structure found in the popliteal fossa.  *See table(s) above for measurements and observations.    Preliminary         Scheduled Meds:  amLODipine  5 mg Oral Daily   Chlorhexidine Gluconate Cloth  6 each Topical Daily   divalproex  1,500 mg Oral QHS   donepezil  5 mg Oral QHS   feeding supplement (ENSURE ENLIVE)  237 mL Oral TID BM   heparin injection (subcutaneous)  5,000 Units Subcutaneous Q8H   multivitamin with minerals  1 tablet Oral Daily   rosuvastatin  5 mg Oral QHS   tamsulosin  0.8 mg Oral QHS   Continuous Infusions:  piperacillin-tazobactam (ZOSYN)  IV 3.375 g (06/25/19 0336)          Aline August, MD [facility] 06/25/2019, 7:48 AM

## 2019-06-25 NOTE — Consult Note (Signed)
ORTHOPAEDIC CONSULTATION  REQUESTING PHYSICIAN: Tony August, MD  PCP:  Tony Ochs, MD  Chief Complaint: "I hurt everywhere"  HPI: Tony [name] is a 68 y.o. male with a past medical history of Alzheimer's dementia, stage III chronic kidney disease, diabetes mellitus type 2, hypertension, seizure disorder who was admitted in September for altered mental status due to UTI with urinary retention.  He required placement of Foley catheter and antibiotics.  He was readmitted to [facility] 2 weeks ago with malaise and subjective fever/chills.  He was febrile to 102.6 degrees 6 days ago.  The family then requested transfer to [facility].  He has been complaining of diffuse pain, worse in the right lower extremity.  He had MRI of bilateral hips, femurs, and knees yesterday.  The MRIs showed generalized edema.  He has edema in the subcutaneous fat, musculature, and bilateral hips and knees.  The MRI was negative for gas and abscess.  Orthopedic consultation was placed for evaluation of these findings.  The patient is a poor historian due to altered mental status, and most of the history was obtained from the chart.  Bilateral lower extremity dopplers were negative for DVT.   Past Medical History:  Diagnosis Date   Alzheimer's disease ([place])    BPH (benign prostatic hyperplasia)    CKD (chronic kidney disease)    Diabetes mellitus    Type II, diagnosed 11-Nov-2009, not on insulin; admitted in 11-11-2009 for hyperglycemia   HTN (hypertension)    Seizure disorder ([place])    diagnosed in childhood, last seizure was years ago   Past Surgical History:  Procedure Laterality Date   NO PAST SURGERIES     Social History   Socioeconomic History   Marital status: Widowed    Spouse name: Not on file   Number of children: 2   Years of education: 65   Highest education level: Not on file  Occupational History   Occupation: Company secretary    Comment: at [employer], 88 years now    Scientist, product/process development strain: Not on file   Food insecurity    Worry: Not on file    Inability: Not on Lexicographer needs    Medical: Not on file    Non-medical: Not on file  Tobacco Use   Smoking status: Never Smoker   Smokeless tobacco: Never Used  Substance and Sexual Activity   Alcohol use: No    Alcohol/week: 0.0 standard drinks   Drug use: No   Sexual activity: Never    Partners: Female    Comment: wife passed away in Sep 13, 2010  Lifestyle   Physical activity    Days per week: Not on file    Minutes per session: Not on file   Stress: Not on file  Relationships   Social connections    Talks on phone: Not on file    Gets together: Not on file    Attends religious service: Not on file    Active member of club or organization: Not on file    Attends meetings of clubs or organizations: Not on file    Relationship status: Not on file  Other Topics Concern   Not on file  Social History Narrative   Lives in [place] with son   His wife passed away in November 12, 2010        did not complete HS. He has had seizures since adolescence. 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 He got his license and per his brother had a more normal life. He stopped driving 2 years ago. At that time he had the onset of dementia which has been progressive. He lives in his own home and his son, who is disabled, lives with him. His Brother Tony [name], who lives in [place], looks after their affairs. Tony [name] has been working to keep them at home. Tony [name] is enrolled in [facility] and was attending adult day care until a recent decline attributed to his mother's death.   Family History  Problem Relation Age of Onset   Diabetes Mother    Hypertension Mother    No Known Allergies Prior to Admission medications   Medication Sig Start Date End Date Taking? Authorizing Provider  amLODipine (NORVASC) 5 MG tablet  Take 1 tablet (5 mg total) by mouth daily. 04/15/19 06/12/19 Yes Tony [name], Tony D, DO  divalproex (DEPAKOTE ER) 500 MG 24 hr tablet Take 2 tablets (1,000 mg total) by mouth daily. Patient taking differently: Take 1,500 mg by mouth at bedtime.  12/16/18  Yes Tony [name], Elhamalsadat, MD  donepezil (ARICEPT) 5 MG tablet Take 1 tablet (5 mg total) by mouth at bedtime. 12/16/18  Yes Tony [name], Elhamalsadat, MD  lisinopril (ZESTRIL) 20 MG tablet Take 20 mg by mouth daily.   Yes [provider]  rosuvastatin (CRESTOR) 20 MG tablet Take 1 tablet (20 mg total) by mouth at bedtime. Patient taking differently: Take 5 mg by mouth at bedtime.  12/16/18 12/16/19 Yes Tony [name], Elhamalsadat, MD  tamsulosin (FLOMAX) 0.4 MG CAPS capsule Take 1 capsule (0.4 mg total) by mouth daily after breakfast. Patient taking differently: Take 0.8 mg by mouth at bedtime.  12/16/18  Yes Tony [name], Elhamalsadat, MD  Vitamin [name], Ergocalciferol, (DRISDOL) 1.25 MG (50000 UT) CAPS capsule Take 50,000 Units by mouth once a week. Friday   Yes [provider]  cabergoline (DOSTINEX) 0.5 MG tablet Take 1 tablet (0.5 mg total) by mouth daily. Patient not taking: Reported on 06/01/2019 12/16/18   Tony Filler, MD   Dg Pelvis 1-2 Views  Result Date: 06/23/2019 CLINICAL DATA:  68 year old male with bilateral leg pain. EXAM: PELVIS - 1-2 VIEW COMPARISON:  Bilateral femur radiograph dated 06/23/2019. FINDINGS: There is no acute fracture or dislocation. The bones are mildly osteopenic. No significant arthritic changes. Mild degenerative changes of the lower lumbar spine. The soft tissues are unremarkable. IMPRESSION: No acute fracture or dislocation. Electronically Signed   By: Tony [name] M.[name].   On: 06/23/2019 11:54   Mr Frmur Right Wo Contrast  Result Date: 06/24/2019 CLINICAL DATA:  Right thigh pain. EXAM: MRI OF THE RIGHT FEMUR WITHOUT CONTRAST TECHNIQUE: Multiplanar, multisequence MR imaging of the right femur was  performed. No intravenous contrast was administered. COMPARISON:  None. FINDINGS: Bones/Joint/Cartilage Right femur and right hip joint appear normal. There is a prominent right knee effusion. Muscles and Tendons There is extensive edema in the anterior and posterior muscles of the right thigh. There is fluid deep to the iliotibial band. Soft tissues No discrete abscesses. IMPRESSION: Extensive myositis of the right thigh. Prominent nonspecific right knee effusion. No bone abnormality. Electronically Signed   By: Lorriane Shire M.[name].   On: 06/24/2019 18:20   Mr Femur Left Wo Contrast  Result Date: 06/24/2019 CLINICAL DATA:  Left thigh pain. EXAM: MR OF THE LEFT FEMUR WITHOUT CONTRAST TECHNIQUE: Multiplanar, multisequence MR imaging of the  left femur was performed. No intravenous contrast was administered. COMPARISON:  None. FINDINGS: Bones/Joint/Cartilage The left femur appears normal. No evidence of osteomyelitis. Muscles and Tendons There is extensive edema in and around the muscles of the left thigh, most extensive in the origin of vastus lateralis muscle. There is also subcutaneous edema in the left thigh. Edema around the adductor muscles of the left hip. Small left knee effusion. Soft tissues Edema in the subcutaneous fat of the lateral aspect of the thigh. IMPRESSION: 1. No evidence of osteomyelitis or soft tissue abscess. 2. Extensive edema in and around the muscles of the left thigh, most extensive in the origin of the vastus lateralis muscle. 3. Small left knee effusion. Electronically Signed   By: Lorriane Shire M.[name].   On: 06/24/2019 18:18   Mr Hip Right Wo Contrast  Result Date: 06/24/2019 CLINICAL DATA:  Bilateral hip and thigh pain. Sepsis. EXAM: MR OF THE RIGHT HIP WITHOUT CONTRAST TECHNIQUE: Multiplanar, multisequence MR imaging was performed. No intravenous contrast was administered. COMPARISON:  Radiographs dated 06/23/2019 FINDINGS: Bones: Bones of the right hip in the visualized pelvic  bones appear normal. Articular cartilage and labrum Articular cartilage:  Normal. Labrum:  Normal. Joint or bursal effusion Joint effusion:  Small joint effusion. Bursae: Fluid in the greater trochanteric bursa. This appears to originate from the gluteal muscles. Small amount of fluid deep to the iliotibial band distal to the right greater trochanter. Muscles and tendons Muscles and tendons: There is prominent edema in right gluteal muscles as well as in and around the hamstring muscles very prominent edema in the right internal obturator muscle. There is also edema around the adductor muscles. There is slight edema along the fascial planes of the muscles of the anterior thigh including the biceps femoris. Other findings Miscellaneous: No adenopathy. No definable abscesses. There is a small amount of ascites in the presacral space and around the bladder. IMPRESSION: 1. Extensive edema in the right gluteal muscles right thigh muscles and right internal obturator muscle. This is consistent with extensive myositis. No definable gas in the soft tissues. 2. Small amount of fluid deep to the iliotibial band distal to the right greater trochanter likely related to the adjacent myositis. 3. Small amount of ascites in the presacral space and around the bladder. Electronically Signed   By: Lorriane Shire M.[name].   On: 06/24/2019 18:13   Mr Hip Left Wo Contrast  Result Date: 06/24/2019 CLINICAL DATA:  Left hip and thigh pain. EXAM: MR OF THE LEFT HIP WITHOUT CONTRAST TECHNIQUE: Multiplanar, multisequence MR imaging was performed. No intravenous contrast was administered. COMPARISON:  Radiographs dated 06/23/2019 FINDINGS: Bones: The bones of the pelvis and left hip appear normal. Articular cartilage and labrum Articular cartilage:  Normal. Labrum:  Normal. Joint or bursal effusion Joint effusion:  Small joint effusion. Bursae: No bursitis. Fluid deep to the iliotibial band adjacent to the vastus lateralis muscle. Muscles and  tendons Muscles and tendons: Extensive edema in the muscles around the hip petechial E in the proximal vastus lateralis muscle and along the fascial planes in the anterior aspect of the left thigh. Patchy edema in the left gluteal muscles. Slight edema in and around the abductor muscles of the left hip. Other findings Miscellaneous:   No discrete abscesses.  No adenopathy. IMPRESSION: Extensive myositis of the left buttock and left thigh. No discrete abscess. No air in the soft tissues to suggest necrotizing fasciitis at this time. Electronically Signed   By: Lorriane Shire M.[name].  On: 06/24/2019 18:16   Dg Femur Min 2 Views Left  Result Date: 06/23/2019 CLINICAL DATA:  Left upper leg pain of unknown duration. No known injury. EXAM: LEFT FEMUR 2 VIEWS COMPARISON:  None. FINDINGS: There is no evidence of fracture or other focal bone lesions. Mild degenerative change about the left hip and knee noted. Soft tissues are unremarkable. IMPRESSION: No acute abnormality. Mild degenerative disease about the left hip and knee. Electronically Signed   By: Inge Rise M.[name].   On: 06/23/2019 11:54   Dg Femur, Min 2 Views Right  Result Date: 06/23/2019 CLINICAL DATA:  Bilateral leg pain. EXAM: RIGHT FEMUR 2 VIEWS COMPARISON:  None. FINDINGS: There is no evidence of fracture or other focal bone lesions. Soft tissues are unremarkable. IMPRESSION: Negative right femur radiographs. Electronically Signed   By: San Morelle M.[name].   On: 06/23/2019 11:53   Vas [place] Lower Extremity Venous (dvt)  Result Date: 06/25/2019  Lower Venous Study Indications: Right > Left leg/ hip pain.  Limitations: Patient movement/ confusion. Comparison Study: no prior Performing Technologist: June Leap RDMS, RVT  Examination Guidelines: A complete evaluation includes B-mode imaging, spectral Doppler, color Doppler, and power Doppler as needed of all accessible portions of each vessel. Bilateral testing is considered an integral part  of a complete examination. Limited examinations for reoccurring indications may be performed as noted.  +---------+---------------+---------+-----------+----------+--------------+  RIGHT     Compressibility Phasicity Spontaneity Properties Thrombus Aging  +---------+---------------+---------+-----------+----------+--------------+  CFV       Full            Yes       Yes                                    +---------+---------------+---------+-----------+----------+--------------+  SFJ       Full                                                             +---------+---------------+---------+-----------+----------+--------------+  FV Prox   Full                                                             +---------+---------------+---------+-----------+----------+--------------+  FV Mid    Full                                                             +---------+---------------+---------+-----------+----------+--------------+  FV Distal Full                                                             +---------+---------------+---------+-----------+----------+--------------+  PFV       Full                                                             +---------+---------------+---------+-----------+----------+--------------+  POP       Full            Yes       Yes                                    +---------+---------------+---------+-----------+----------+--------------+  PTV       Full                                                             +---------+---------------+---------+-----------+----------+--------------+  PERO      Full                                                             +---------+---------------+---------+-----------+----------+--------------+   +---------+---------------+---------+-----------+----------+--------------+  LEFT      Compressibility Phasicity Spontaneity Properties Thrombus Aging  +---------+---------------+---------+-----------+----------+--------------+  CFV       Full             Yes       Yes                                    +---------+---------------+---------+-----------+----------+--------------+  SFJ       Full                                                             +---------+---------------+---------+-----------+----------+--------------+  FV Prox   Full                                                             +---------+---------------+---------+-----------+----------+--------------+  FV Mid    Full                                                             +---------+---------------+---------+-----------+----------+--------------+  FV Distal Full                                                             +---------+---------------+---------+-----------+----------+--------------+  PFV       Full                                                             +---------+---------------+---------+-----------+----------+--------------+  POP       Full            Yes       Yes                                    +---------+---------------+---------+-----------+----------+--------------+  PTV       Full                                                             +---------+---------------+---------+-----------+----------+--------------+  PERO      Full                                                             +---------+---------------+---------+-----------+----------+--------------+     Summary: Right: There is no evidence of deep vein thrombosis in the lower extremity. No cystic structure found in the popliteal fossa. Left: There is no evidence of deep vein thrombosis in the lower extremity. No cystic structure found in the popliteal fossa.  *See table(s) above for measurements and observations. Electronically signed by Curt Jews MD on 06/25/2019 at 8:47:41 AM.    Final     Positive ROS: All other systems have been reviewed and were otherwise negative with the exception of those mentioned in the HPI and as above.  Physical Exam: General: Alert, no acute  distress Cardiovascular: No pedal edema Respiratory: No cyanosis, no use of accessory musculature GI: No organomegaly, abdomen is soft and non-tender Skin: No lesions in the area of chief complaint Neurologic: Sensation intact distally Psychiatric: Patient is competent for consent with normal mood and affect Lymphatic: No axillary or cervical lymphadenopathy  MUSCULOSKELETAL: Evaluation of bilateral lower extremities reveals mild edema about the thighs.  I was able to distract the patient with conversation, and he was found to have painless logrolling of both hips.  No significant tenderness to palpation over the trochanters or IT band bilaterally he has mild tenderness to palpation of the right knee.  The left knee is nontender.  There is a small effusion in the right knee.  The left knee has no effusion.  There is no warmth or erythema to the lower extremities.  He has positive motor function dorsiflexion, plantarflexion, great toe extension bilaterally.  He has palpable pedal pulses.  He refuses to attempt straight leg raising.  Assessment: Bilateral lower extremity edema from medical illness.  Plan: I discussed the findings with the patient and the nursing staff present.  MRI has ruled out abscess and necrotizing fasciitis.  The most impressive finding on his MRI is the right knee effusion.  I recommended right knee aspiration.  With his verbal consent, sterilely prepped and draped the right knee.  I anesthetized the skin and subcutaneous tissues with 5 cc of 1% plain lidocaine.  I then inserted an 18-gauge needle into the suprapatellar pouch of the right knee using aseptic technique. I was able to withdraw a few drops of clear, straw-colored joint fluid.  Due to the benign appearance, I discarded the fluid.  A sterile dressing was  applied.  Clinically he does not have any evidence of septic arthritis of bilateral hips or knees.  I would recommend CT pelvis with IV contrast to rule out DVT of the  IVC/iliacs.  Overall, his lower extremity edema is really not that impressive.  I think his edema is due to immobility over the past several weeks from being medically ill.  I think that his situation is complicated by altered mental status.  Recommend supportive care for his edema.  There is no orthopedic surgical indication.  All questions solicited and answered.    Bertram Savin, MD [phone]    06/25/2019 10:21 AM

## 2019-06-26 LAB — CBC WITH DIFFERENTIAL/PLATELET
Abs Immature Granulocytes: 0.16 10*3/uL — ABNORMAL HIGH (ref 0.00–0.07)
Basophils Absolute: 0 10*3/uL (ref 0.0–0.1)
Basophils Relative: 0 %
Eosinophils Absolute: 0.3 10*3/uL (ref 0.0–0.5)
Eosinophils Relative: 2 %
HCT: 37 % — ABNORMAL LOW (ref 39.0–52.0)
Hemoglobin: 11.7 g/dL — ABNORMAL LOW (ref 13.0–17.0)
Immature Granulocytes: 1 %
Lymphocytes Relative: 19 %
Lymphs Abs: 2.2 10*3/uL (ref 0.7–4.0)
MCH: 31.6 pg (ref 26.0–34.0)
MCHC: 31.6 g/dL (ref 30.0–36.0)
MCV: 100 fL (ref 80.0–100.0)
Monocytes Absolute: 1 10*3/uL (ref 0.1–1.0)
Monocytes Relative: 9 %
Neutro Abs: 8 10*3/uL — ABNORMAL HIGH (ref 1.7–7.7)
Neutrophils Relative %: 69 %
Platelets: 407 10*3/uL — ABNORMAL HIGH (ref 150–400)
RBC: 3.7 MIL/uL — ABNORMAL LOW (ref 4.22–5.81)
RDW: 12.1 % (ref 11.5–15.5)
WBC: 11.6 10*3/uL — ABNORMAL HIGH (ref 4.0–10.5)
nRBC: 0 % (ref 0.0–0.2)

## 2019-06-26 LAB — BASIC METABOLIC PANEL
Anion gap: 13 (ref 5–15)
BUN: 14 mg/dL (ref 8–23)
CO2: 28 mmol/L (ref 22–32)
Calcium: 9.1 mg/dL (ref 8.9–10.3)
Chloride: 100 mmol/L (ref 98–111)
Creatinine, Ser: 1.31 mg/dL — ABNORMAL HIGH (ref 0.61–1.24)
GFR calc Af Amer: >60 mL/min (ref >60)
GFR calc non Af Amer: 55 mL/min — ABNORMAL LOW (ref >60)
Glucose, Bld: 129 mg/dL — ABNORMAL HIGH (ref 70–99)
Potassium: 5.2 mmol/L — ABNORMAL HIGH (ref 3.5–5.1)
Sodium: 141 mmol/L (ref 135–145)

## 2019-06-26 LAB — C-REACTIVE PROTEIN: CRP: 12.3 mg/dL — ABNORMAL HIGH (ref <1.0)

## 2019-06-26 LAB — CK: Total CK: 142 U/L (ref 49–397)

## 2019-06-26 LAB — MAGNESIUM: Magnesium: 2.3 mg/dL (ref 1.7–2.4)

## 2019-06-26 MED ORDER — LEVOFLOXACIN 500 MG PO TABS
500.0000 mg | ORAL_TABLET | Freq: Every day | ORAL | Status: DC
  Administered 2019-06-26 – 2019-06-27 (×2): 500 mg via ORAL
  Filled 2019-06-26 (×3): qty 1

## 2019-06-26 NOTE — TOC Progression Note (Addendum)
Transition of Care [facility]) - Progression Note    Patient Details  Name: DEAGEN GAUNA MRN: [id number] Date of Birth: Nov 04, 1950  Transition of Care [facility]) CM/SW [place], [place] Phone Number: [phone] 06/26/2019, 11:35 AM  Clinical Narrative:     Update: [name] reports they can accept patient tomorrow morning 11/28 as they do not have staff avail to admit today. MD informed.   CSW informed by MD patient medically stable to dc. CSW has called and texted [facility] admissions to inquire if they are able to accept patient today, pending response at this time.   Expected Discharge Plan: [place] Barriers to Discharge: Continued Medical Work up  Expected Discharge Plan and Services Expected Discharge Plan: [place] Acute Care Choice: [place]                                         Social Determinants of Health (SDOH) Interventions    Readmission Risk Interventions No flowsheet data found.

## 2019-06-26 NOTE — Progress Notes (Signed)
  Speech Language Pathology Treatment: Dysphagia  Patient Details Name: Tony [name] MRN: [id number] DOB: 03-Feb-1951 Today's Date: 06/26/2019 Time: [id number] SLP Time Calculation (min) (ACUTE ONLY): 11 min  Assessment / Plan / Recommendation Clinical Impression  Pt is more interactive today and has a swifter oral transit time, but he still has mild difficulty masticating even small, soft pieces of food. He tried a little bit of solids but then declined the rest, saying that it was hard to chew. He preferred pureed textures offered. Will advance diet to Dys 1 solids, thin liquids.    HPI HPI: Pt is a 67 year old male presenting with AMS and fever; admitted with CAUTI sepsis. CXR also showed new RLL opacity. PMH: Alzheimer's dementia, CKD stage 3, diabetes, HTN, seizure disorder, UTIs, indwelling urinary catheter, plans for tentative TURP      SLP Plan  Continue with current plan of care       Recommendations  Diet recommendations: Dysphagia 1 (puree);Thin liquid Liquids provided via: Cup;Straw Medication Administration: Crushed with puree Supervision: Staff to assist with self feeding;Full supervision/cueing for compensatory strategies Compensations: Minimize environmental distractions;Slow rate;Small sips/bites;Follow solids with liquid Postural Changes and/or Swallow Maneuvers: Seated upright 90 degrees                Oral Care Recommendations: Oral care BID Follow up Recommendations: 24 hour supervision/assistance;Skilled Nursing facility SLP Visit Diagnosis: Dysphagia, oral phase (R13.11) Plan: Continue with current plan of care       GO                Venita Sheffield Khai Arrona 06/26/2019, 11:30 AM  Pollyann Glen, M.A. [place] Acute Environmental education officer [phone] Office [phone]

## 2019-06-26 NOTE — Progress Notes (Signed)
PROGRESS NOTE    Tony [name]    Code Status: Full Code  [id number] DOB: [birth year] DOA: 06/12/2019  PCP: Velna Ochs, MD    Hospital Summary  68 year old male with history of Alzheimer's dementia, chronic kidney disease stage III, diabetes mellitus type 2, hypertension, seizure disorder, hospitalization from 04/10/2019-04/15/2019 for altered mental status secondary to Enterococcus and procidentia UTI along with urinary retention with subsequent discharge home on Levaquin and indwelling Foley catheter with outpatient plans for TURP by urology on 07/10/2019 presented with subjective fevers and chills and generalized malaise on 06/12/2019.  Foley catheter was changed on 06/13/2019 as per urology recommendations.  Patient spiked a temperature of 102.6 on the evening of 06/19/2019; repeat labs and chest x-ray were obtained which suggested new right lower lobe opacity.  He was transferred to [facility] on 06/22/2019 per family request.  Sepsis has since resolved and is currently being treated for HAP.  Patient has had complaints of worsening generalized pain most notably in bilateral hips with MRI showing generalized edema.  Orthopedic surgery was consulted who recommended conservative management as well as right knee joint aspiration for right knee effusion.  A & P   Active Problems:   Hypertension   Seizure disorder (HCC)   HLD (hyperlipidemia)   Dementia (HCC)   Complicated UTI (urinary tract infection)   Sepsis secondary to UTI ([place])   Acute renal failure superimposed on stage 3b chronic kidney disease (HCC)   BPH with obstruction/lower urinary tract symptoms   Pressure injury of skin   Sepsis ([place])   Acute metabolic encephalopathy  Sepsis secondary to suspected Citrobacter and Enterococcus CAUTI with chronic indwelling Foley: Present on admission -Patient was treated with cefepime since admission and subsequently switched to cefdinir which was switched to Zosyn on 11/21  for suspected HAP after patient had spiking fevers as below. - Foley catheter was changed on 06/13/2019 as per urology recommendations.  Outpatient follow-up with urology for TURP scheduled for 07/10/2019. -Patient started spiking temperatures again from 06/19/2019.  Probable HAP -Afebrile -Patient was started on Zosyn from 06/20/2019 onwards.  Repeat blood cultures from 06/20/2019 are negative so far.  Blood and urine cultures repeated on 06/22/2019. -CT of the chest/abdomen and pelvis on 06/22/2019 showed bilateral lower lobe atelectasis with small right and trace left pleural effusions and suspected cystitis. -Diet as per SLP evaluation.  Generalized myalgias with hip and bilateral thigh pain -X-rays of the hip and femur: Unremarkable.   -MRI of the hip and femurs show features of extensive myositis.  Spoke to Dr. Swinteck/orthopedics who has evaluated the patient and recommend conservative management.   -CT venogram abdomen pelvis negative for DVT but concerning findings for prostatitis -will hold rosuvastatin as this can cause myalgia/myositis  Acute kidney injury on chronic kidney disease stage III -At/below baseline.  Baseline creatinine 1.5-1.8.  Creatinine peaked at 2.98.  Has Foley -Discontinue fluids  Prostatitis On Zosyn.  Afebrile with improving leukocytosis. -will switch to Levaquin with plan to continue until follow-up with urology outpatient 12/11  Hypernatremia  -Improved.  Monitor.  Essential hypertension -Continue amlodipine  Seizure disorder -continue Depakote  Alzheimer's dementia Generalized deconditioning -Continue donepezil.  Apparently, brother has noted functional and cognitive decline over the last 6 months. -Palliative care following.  Currently remains full code. -We will need nursing home placement.  BPH -Continue Flomax -Plan for TURP on 12/11 with Dr. Gloriann Loan  Stage II sacral pressure injury: Present on admission -Continue wound  care  Coagulase-negative bacteremia, likely  contaminant -On presentation.    Hyperkalemia asymptomatic.  K5.2 -Monitor    DVT prophylaxis: Heparin Diet: Dysphagia 1 Family Communication: Patient's brother has been updated via phone today Disposition Plan: Medically stable for discharge, likely DC in 1 to 2 days pending bed availability.  Consultants  Palliative care Urology  Procedures  None  Antibiotics   Anti-infectives (From admission, onward)   Start     Dose/Rate Route Frequency Ordered Stop   06/26/19 1345  levofloxacin (LEVAQUIN) tablet 500 mg     500 mg Oral Daily 06/26/19 1333     06/20/19 2000  piperacillin-tazobactam (ZOSYN) IVPB 3.375 g  Status:  Discontinued     3.375 g 12.5 mL/hr over 240 Minutes Intravenous Every 8 hours 06/20/19 1102 06/26/19 1333   06/20/19 1200  piperacillin-tazobactam (ZOSYN) IVPB 3.375 g     3.375 g 100 mL/hr over 30 Minutes Intravenous  Once 06/20/19 1125 06/20/19 1628   06/17/19 1500  amoxicillin (AMOXIL) capsule 500 mg  Status:  Discontinued     500 mg Oral Every 8 hours 06/17/19 1449 06/20/19 1102   06/17/19 1500  cefdinir (OMNICEF) capsule 300 mg  Status:  Discontinued     300 mg Oral Every 12 hours 06/17/19 1449 06/20/19 1102   06/17/19 1400  levofloxacin (LEVAQUIN) tablet 250 mg  Status:  Discontinued     250 mg Oral Daily 06/17/19 1235 06/17/19 1449   06/16/19 2200  amoxicillin-clavulanate (AUGMENTIN) 875-125 MG per tablet 1 tablet  Status:  Discontinued     1 tablet Oral Every 12 hours 06/16/19 1524 06/17/19 1235   06/16/19 1245  cefdinir (OMNICEF) capsule 300 mg  Status:  Discontinued     300 mg Oral Every 12 hours 06/16/19 1231 06/17/19 1235   06/16/19 1245  amoxicillin-clavulanate (AUGMENTIN) 500-125 MG per tablet 500 mg  Status:  Discontinued     1 tablet Oral Every 12 hours 06/16/19 1231 06/16/19 1524   06/14/19 1400  vancomycin (VANCOCIN) IVPB 1000 mg/200 mL premix  Status:  Discontinued     1,000 mg 200 mL/hr over  60 Minutes Intravenous Every 24 hours 06/13/19 1102 06/13/19 1111   06/14/19 0912  ceFEPIme (MAXIPIME) 2 g in sodium chloride 0.9 % 100 mL IVPB  Status:  Discontinued     2 g 200 mL/hr over 30 Minutes Intravenous Every 24 hours 06/13/19 1111 06/16/19 1231   06/13/19 1600  vancomycin (VANCOCIN) IVPB 1000 mg/200 mL premix  Status:  Discontinued     1,000 mg 200 mL/hr over 60 Minutes Intravenous Every 24 hours 06/13/19 1111 06/15/19 1005   06/13/19 1130  vancomycin (VANCOCIN) IVPB 1000 mg/200 mL premix  Status:  Discontinued     1,000 mg 200 mL/hr over 60 Minutes Intravenous Every 1 hr x 2 06/13/19 1102 06/13/19 1109   06/12/19 2200  ceFEPIme (MAXIPIME) 2 g in sodium chloride 0.9 % 100 mL IVPB  Status:  Discontinued     2 g 200 mL/hr over 30 Minutes Intravenous Every 12 hours 06/12/19 1530 06/13/19 1111   06/12/19 1300  vancomycin (VANCOCIN) IVPB 1000 mg/200 mL premix  Status:  Discontinued     1,000 mg 200 mL/hr over 60 Minutes Intravenous Every 1 hr x 2 06/12/19 1215 06/12/19 1431   06/12/19 1215  ceFEPIme (MAXIPIME) 2 g in sodium chloride 0.9 % 100 mL IVPB     2 g 200 mL/hr over 30 Minutes Intravenous  Once 06/12/19 1207 06/12/19 1336   06/12/19 1215  metroNIDAZOLE (FLAGYL) IVPB 500  mg  Status:  Discontinued     500 mg 100 mL/hr over 60 Minutes Intravenous  Once 06/12/19 1207 06/12/19 1442   06/12/19 1215  vancomycin (VANCOCIN) IVPB 1000 mg/200 mL premix  Status:  Discontinued     1,000 mg 200 mL/hr over 60 Minutes Intravenous  Once 06/12/19 1207 06/12/19 1215   06/12/19 1200  cefTRIAXone (ROCEPHIN) 1 g in sodium chloride 0.9 % 100 mL IVPB  Status:  Discontinued     1 g 200 mL/hr over 30 Minutes Intravenous  Once 06/12/19 1154 06/12/19 1207           Subjective   Patient seen and examined at bedside no acute distress and resting comfortably.  Nurse at bedside states patient has had persistent myalgias but otherwise no issues.  Patient admits to pain most notably with palpation.   Otherwise no other complaints.  Tolerating diet.     Objective   Vitals:   06/26/19 0006 06/26/19 0007 06/26/19 0516 06/26/19 0802  BP:  130/71 133/67 136/84  Pulse:  88 88   Resp:  18 20   Temp: 98.4 F (36.9 C)  98.3 F (36.8 C) 98.9 F (37.2 C)  TempSrc: Oral  Oral Oral  SpO2:  100% 99% 100%  Weight:   92.3 kg   Height:        Intake/Output Summary (Last 24 hours) at 06/26/2019 1335 Last data filed at 06/26/2019 0511 Gross per 24 hour  Intake 2147.84 ml  Output 1425 ml  Net 722.84 ml   Filed Weights   06/12/19 1039 06/23/19 0630 06/26/19 0516  Weight: 97 kg 91.2 kg 92.3 kg    Examination:  Physical Exam Vitals signs and nursing note reviewed.  Constitutional:      Appearance: Normal appearance.  HENT:     Head: Normocephalic and atraumatic.     Nose: Nose normal.     Mouth/Throat:     Mouth: Mucous membranes are moist.  Eyes:     Extraocular Movements: Extraocular movements intact.  Neck:     Musculoskeletal: Normal range of motion. No neck rigidity.  Cardiovascular:     Rate and Rhythm: Normal rate and regular rhythm.  Pulmonary:     Effort: Pulmonary effort is normal.     Breath sounds: Normal breath sounds.  Abdominal:     General: Abdomen is flat.     Palpations: Abdomen is soft.  Musculoskeletal: Normal range of motion.     Right lower leg: No edema.     Left lower leg: No edema.     Comments: Diffuse pain when patient was shifted in bed  Neurological:     Mental Status: He is alert. Mental status is at baseline.  Psychiatric:        Mood and Affect: Mood normal.        Behavior: Behavior normal.     Data Reviewed: I have personally reviewed following labs and imaging studies  CBC: Recent Labs  Lab 06/22/19 0616 06/23/19 0801 06/24/19 0215 06/25/19 0219 06/26/19 0209  WBC 15.0* 13.9* 13.5* 13.6* 11.6*  NEUTROABS  --  9.5* 9.3* 9.4* 8.0*  HGB 12.3* 10.9* 12.8* 10.3* 11.7*  HCT 39.0 33.6* 40.1 31.7* 37.0*  MCV 99.2 98.0 99.5  97.8 100.0  PLT 310 358 355 420* [id number]*   Basic Metabolic Panel: Recent Labs  Lab 06/22/19 0616 06/23/19 0801 06/24/19 0215 06/25/19 0219 06/26/19 0209  NA 141 143 143 141 141  K 3.9 4.3 4.1 4.2 5.2*  CL 99 102 103 101 100  CO2 31 31 29  32 28  GLUCOSE 127* 146* 129* 135* 129*  BUN 17 20 18 12 14   CREATININE 1.53* 1.66* 1.52* 1.43* 1.31*  CALCIUM 8.8* 9.0 9.1 8.8* 9.1  MG 2.1 2.2 2.1 2.0 2.3   GFR: Estimated Creatinine Clearance: 61.8 mL/min (A) (by C-G formula based on SCr of 1.31 mg/dL (H)). Liver Function Tests: Recent Labs  Lab 06/23/19 0801 06/24/19 0215  AST 46* 43*  ALT 29 28  ALKPHOS 35* 40  BILITOT 0.6 0.9  PROT 6.9 7.1  ALBUMIN 2.0* 2.1*   No results for input(s): LIPASE, AMYLASE in the last 168 hours. Recent Labs  Lab 06/19/19 1937  AMMONIA 30   Coagulation Profile: No results for input(s): INR, PROTIME in the last 168 hours. Cardiac Enzymes: Recent Labs  Lab 06/26/19 0209  CKTOTAL 142   BNP (last 3 results) No results for input(s): PROBNP in the last 8760 hours. HbA1C: No results for input(s): HGBA1C in the last 72 hours. CBG: Recent Labs  Lab 06/23/19 0742  GLUCAP 145*   Lipid Profile: No results for input(s): CHOL, HDL, LDLCALC, TRIG, CHOLHDL, LDLDIRECT in the last 72 hours. Thyroid Function Tests: No results for input(s): TSH, T4TOTAL, FREET4, T3FREE, THYROIDAB in the last 72 hours. Anemia Panel: No results for input(s): VITAMINB12, FOLATE, FERRITIN, TIBC, IRON, RETICCTPCT in the last 72 hours. Sepsis Labs: Recent Labs  Lab 06/19/19 1937 06/20/19 1248 06/22/19 1345  PROCALCITON 0.21  --  0.24  LATICACIDVEN  --  1.4 1.8    Recent Results (from the past 240 hour(s))  SARS CORONAVIRUS 2 (TAT 6-24 HRS) Nasopharyngeal Nasopharyngeal Swab     Status: None   Collection Time: 06/19/19 11:59 AM   Specimen: Nasopharyngeal Swab  Result Value Ref Range Status   SARS Coronavirus 2 NEGATIVE NEGATIVE Final    Comment: (NOTE) SARS-CoV-2  target nucleic acids are NOT DETECTED. The SARS-CoV-2 RNA is generally detectable in upper and lower respiratory specimens during the acute phase of infection. Negative results do not preclude SARS-CoV-2 infection, do not rule out co-infections with other pathogens, and should not be used as the sole basis for treatment or other patient management decisions. Negative results must be combined with clinical observations, patient history, and epidemiological information. The expected result is Negative. Fact Sheet for Patients: SugarRoll.be Fact Sheet for Healthcare Providers: https://www.woods-mathews.com/ This test is not yet approved or cleared by the [place] FDA and  has been authorized for detection and/or diagnosis of SARS-CoV-2 by FDA under an Emergency Use Authorization (EUA). This EUA will remain  in effect (meaning this test can be used) for the duration of the COVID-19 declaration under Section 56 4(b)(1) of the Act, 21 U.S.C. section 360bbb-3(b)(1), unless the authorization is terminated or revoked sooner. Performed at [facility], [place] [address], [place], [place] [address]   Culture, Urine     Status: None   Collection Time: 06/19/19  3:24 PM   Specimen: Urine, Clean Catch  Result Value Ref Range Status   Specimen Description   Final    URINE, CLEAN CATCH Performed at [facility], [address] [place], [place] [address]    Special Requests   Final    NONE Performed at [facility], [address] [place], [place] [address]    Culture   Final    NO GROWTH Performed at [facility], [place] [address], [place], [place] [address]    Report Status  06/20/2019 FINAL  Final  Culture, blood (routine x 2)     Status: None   Collection Time: 06/20/19 12:48 PM   Specimen: Right Antecubital; Blood  Result Value Ref Range Status   Specimen Description   Final    RIGHT ANTECUBITAL BOTTLES DRAWN AEROBIC AND ANAEROBIC     Special Requests Blood Culture adequate volume  Final   Culture   Final    NO GROWTH 5 DAYS Performed at [facility], [address], [place], [place] [address]    Report Status 06/25/2019 FINAL  Final  Culture, blood (routine x 2)     Status: None   Collection Time: 06/20/19 12:56 PM   Specimen: BLOOD LEFT WRIST  Result Value Ref Range Status   Specimen Description BLOOD LEFT WRIST BOTTLES DRAWN AEROBIC ONLY  Final   Special Requests Blood Culture adequate volume  Final   Culture   Final    NO GROWTH 5 DAYS Performed at [facility], [address] [place], [place] [address]    Report Status 06/25/2019 FINAL  Final  MRSA PCR Screening     Status: None   Collection Time: 06/20/19  4:05 PM   Specimen: Nasal Mucosa; Nasopharyngeal  Result Value Ref Range Status   MRSA by PCR NEGATIVE NEGATIVE Final    Comment:        The GeneXpert MRSA Assay (FDA approved for NASAL specimens only), is one component of a comprehensive MRSA colonization surveillance program. It is not intended to diagnose MRSA infection nor to guide or monitor treatment for MRSA infections. Performed at [facility], [address], [place], [place] [address]   SARS CORONAVIRUS 2 (TAT 6-24 HRS) Nasopharyngeal Nasopharyngeal Swab     Status: None   Collection Time: 06/21/19  4:10 PM   Specimen: Nasopharyngeal Swab  Result Value Ref Range Status   SARS Coronavirus 2 NEGATIVE NEGATIVE Final    Comment: (NOTE) SARS-CoV-2 target nucleic acids are NOT DETECTED. The SARS-CoV-2 RNA is generally detectable in upper and lower respiratory specimens during the acute phase of infection. Negative results do not preclude SARS-CoV-2 infection, do not rule out co-infections with other pathogens, and should not be used as the sole basis for treatment or other patient management decisions. Negative results must be combined with clinical observations, patient history, and epidemiological information. The expected result is  Negative. Fact Sheet for Patients: SugarRoll.be Fact Sheet for Healthcare Providers: https://www.woods-mathews.com/ This test is not yet approved or cleared by the [place] FDA and  has been authorized for detection and/or diagnosis of SARS-CoV-2 by FDA under an Emergency Use Authorization (EUA). This EUA will remain  in effect (meaning this test can be used) for the duration of the COVID-19 declaration under Section 56 4(b)(1) of the Act, 21 U.S.C. section 360bbb-3(b)(1), unless the authorization is terminated or revoked sooner. Performed at [facility], [place] [address] [place], [place] [address]   Culture, Urine     Status: None   Collection Time: 06/22/19  9:37 AM   Specimen: Urine, Clean Catch  Result Value Ref Range Status   Specimen Description   Final    URINE, CLEAN CATCH Performed at [facility], [address], [place], [place] [address]    Special Requests   Final    NONE Performed at [facility], [address], [place],  [address]    Culture   Final    NO GROWTH Performed at [facility], [place],  [place] [address]    Report Status 06/23/2019 FINAL  Final  Culture, blood (routine x 2)     Status: None (Preliminary result)   Collection Time: 06/22/19  1:45 PM   Specimen: BLOOD RIGHT ARM  Result Value Ref Range Status   Specimen Description BLOOD RIGHT ARM  Final   Special Requests   Final    BOTTLES DRAWN AEROBIC AND ANAEROBIC Blood Culture adequate volume   Culture   Final    NO GROWTH 4 DAYS Performed at [facility], [address] [place], [place] [address]    Report Status PENDING  Incomplete  Culture, blood (routine x 2)     Status: None (Preliminary result)   Collection Time: 06/22/19  1:45 PM   Specimen: BLOOD RIGHT ARM  Result Value Ref Range Status   Specimen Description BLOOD RIGHT ARM  Final   Special Requests   Final    BOTTLES DRAWN AEROBIC AND ANAEROBIC Blood  Culture adequate volume   Culture   Final    NO GROWTH 4 DAYS Performed at [facility], [address], [place], [place] [address]    Report Status PENDING  Incomplete  SARS CORONAVIRUS 2 (TAT 6-24 HRS) Nasopharyngeal Nasopharyngeal Swab     Status: None   Collection Time: 06/25/19  4:17 PM   Specimen: Nasopharyngeal Swab  Result Value Ref Range Status   SARS Coronavirus 2 NEGATIVE NEGATIVE Final    Comment: (NOTE) SARS-CoV-2 target nucleic acids are NOT DETECTED. The SARS-CoV-2 RNA is generally detectable in upper and lower respiratory specimens during the acute phase of infection. Negative results do not preclude SARS-CoV-2 infection, do not rule out co-infections with other pathogens, and should not be used as the sole basis for treatment or other patient management decisions. Negative results must be combined with clinical observations, patient history, and epidemiological information. The expected result is Negative. Fact Sheet for Patients: SugarRoll.be Fact Sheet for Healthcare Providers: https://www.woods-mathews.com/ This test is not yet approved or cleared by the [place] FDA and  has been authorized for detection and/or diagnosis of SARS-CoV-2 by FDA under an Emergency Use Authorization (EUA). This EUA will remain  in effect (meaning this test can be used) for the duration of the COVID-19 declaration under Section 56 4(b)(1) of the Act, 21 U.S.C. section 360bbb-3(b)(1), unless the authorization is terminated or revoked sooner. Performed at [facility], [place] [address] [place], [place] [address]          Radiology Studies: Mr Orlean Patten Right Wo Contrast  Result Date: 06/24/2019 CLINICAL DATA:  Right thigh pain. EXAM: MRI OF THE RIGHT FEMUR WITHOUT CONTRAST TECHNIQUE: Multiplanar, multisequence MR imaging of the right femur was performed. No intravenous contrast was administered. COMPARISON:  None. FINDINGS:  Bones/Joint/Cartilage Right femur and right hip joint appear normal. There is a prominent right knee effusion. Muscles and Tendons There is extensive edema in the anterior and posterior muscles of the right thigh. There is fluid deep to the iliotibial band. Soft tissues No discrete abscesses. IMPRESSION: Extensive myositis of the right thigh. Prominent nonspecific right knee effusion. No bone abnormality. Electronically Signed   By: Lorriane Shire M.D.   On: 06/24/2019 18:20   Mr Femur Left Wo Contrast  Result Date: 06/24/2019 CLINICAL DATA:  Left thigh pain. EXAM: MR OF THE LEFT FEMUR WITHOUT CONTRAST TECHNIQUE: Multiplanar, multisequence MR imaging of the left femur was performed. No intravenous contrast was administered. COMPARISON:  None. FINDINGS: Bones/Joint/Cartilage The left femur appears normal. No evidence  of osteomyelitis. Muscles and Tendons There is extensive edema in and around the muscles of the left thigh, most extensive in the origin of vastus lateralis muscle. There is also subcutaneous edema in the left thigh. Edema around the adductor muscles of the left hip. Small left knee effusion. Soft tissues Edema in the subcutaneous fat of the lateral aspect of the thigh. IMPRESSION: 1. No evidence of osteomyelitis or soft tissue abscess. 2. Extensive edema in and around the muscles of the left thigh, most extensive in the origin of the vastus lateralis muscle. 3. Small left knee effusion. Electronically Signed   By: Lorriane Shire M.D.   On: 06/24/2019 18:18   Mr Hip Right Wo Contrast  Result Date: 06/24/2019 CLINICAL DATA:  Bilateral hip and thigh pain. Sepsis. EXAM: MR OF THE RIGHT HIP WITHOUT CONTRAST TECHNIQUE: Multiplanar, multisequence MR imaging was performed. No intravenous contrast was administered. COMPARISON:  Radiographs dated 06/23/2019 FINDINGS: Bones: Bones of the right hip in the visualized pelvic bones appear normal. Articular cartilage and labrum Articular cartilage:  Normal.  Labrum:  Normal. Joint or bursal effusion Joint effusion:  Small joint effusion. Bursae: Fluid in the greater trochanteric bursa. This appears to originate from the gluteal muscles. Small amount of fluid deep to the iliotibial band distal to the right greater trochanter. Muscles and tendons Muscles and tendons: There is prominent edema in right gluteal muscles as well as in and around the hamstring muscles very prominent edema in the right internal obturator muscle. There is also edema around the adductor muscles. There is slight edema along the fascial planes of the muscles of the anterior thigh including the biceps femoris. Other findings Miscellaneous: No adenopathy. No definable abscesses. There is a small amount of ascites in the presacral space and around the bladder. IMPRESSION: 1. Extensive edema in the right gluteal muscles right thigh muscles and right internal obturator muscle. This is consistent with extensive myositis. No definable gas in the soft tissues. 2. Small amount of fluid deep to the iliotibial band distal to the right greater trochanter likely related to the adjacent myositis. 3. Small amount of ascites in the presacral space and around the bladder. Electronically Signed   By: Lorriane Shire M.D.   On: 06/24/2019 18:13   Mr Hip Left Wo Contrast  Result Date: 06/24/2019 CLINICAL DATA:  Left hip and thigh pain. EXAM: MR OF THE LEFT HIP WITHOUT CONTRAST TECHNIQUE: Multiplanar, multisequence MR imaging was performed. No intravenous contrast was administered. COMPARISON:  Radiographs dated 06/23/2019 FINDINGS: Bones: The bones of the pelvis and left hip appear normal. Articular cartilage and labrum Articular cartilage:  Normal. Labrum:  Normal. Joint or bursal effusion Joint effusion:  Small joint effusion. Bursae: No bursitis. Fluid deep to the iliotibial band adjacent to the vastus lateralis muscle. Muscles and tendons Muscles and tendons: Extensive edema in the muscles around the hip  petechial E in the proximal vastus lateralis muscle and along the fascial planes in the anterior aspect of the left thigh. Patchy edema in the left gluteal muscles. Slight edema in and around the abductor muscles of the left hip. Other findings Miscellaneous:   No discrete abscesses.  No adenopathy. IMPRESSION: Extensive myositis of the left buttock and left thigh. No discrete abscess. No air in the soft tissues to suggest necrotizing fasciitis at this time. Electronically Signed   By: Lorriane Shire M.D.   On: 06/24/2019 18:16   Ct Venogram Abd/pel  Result Date: 06/25/2019 CLINICAL DATA:  Edema bilateral lower extremities  with negative bilateral lower extremity duplex ultrasound for DVT. EXAM: CT VENOGRAM ABD-PELVIS TECHNIQUE: Multidetector CT imaging of the abdomen and pelvis was performed using the standard protocol during bolus administration of intravenous contrast. Multiplanar reconstructed images and MIPs were obtained and reviewed to evaluate the vascular anatomy. CONTRAST:  135mL OMNIPAQUE IOHEXOL 350 MG/ML SOLN COMPARISON:  CT of the abdomen and pelvis on 06/22/2019 FINDINGS: VASCULAR The examination was timed and targeted at venous evaluation. Imaging reveals no evidence of venous thrombus, stenosis or mass effect involving the common femoral veins, iliac veins or IVC. Mesenteric veins, renal veins, splenic vein and portal vein are normally patent. No anatomic venous variants. Review of the MIP images confirms the above findings. NON-VASCULAR Lower chest: Atelectasis at the posterior right lung base. Configuration may represent rounded atelectasis. Hepatobiliary: No focal liver abnormality is seen. No gallstones, gallbladder wall thickening, or biliary dilatation. Pancreas: Unremarkable. No pancreatic ductal dilatation or surrounding inflammatory changes. Spleen: Normal in size without focal abnormality. Adrenals/Urinary Tract: Adrenal glands are unremarkable. Stable bilateral renal cysts. Both  kidneys demonstrate no calculi, solid masses, or hydronephrosis. The bladder continues to contain a Foley catheter. The bladder wall demonstrates severe thickening and irregularity with suggestion of increased inflammation and surrounding stranding in the adjacent fat since the prior CT suggestive of active cystitis. Stomach/Bowel: Bowel shows no evidence of obstruction, ileus, inflammation or lesion. No free air. Lymphatic: No enlarged lymph nodes identified in the abdomen or pelvis. Reproductive: Enlarged prostate gland appears inflamed. Other: No hernias, focal abscess or ascites. Musculoskeletal: No acute or significant osseous findings. IMPRESSION: 1. No evidence of venous thrombus, stenosis or mass effect involving the common femoral veins, iliac veins or IVC. 2. Severe thickening and irregularity of the bladder wall with suggestion of increased inflammation and surrounding stranding in the adjacent fat suggestive of active cystitis. There may also be active adjacent prostatitis. 3. Atelectasis at the posterior right lung base. Configuration may represent rounded atelectasis. Electronically Signed   By: Aletta Edouard M.D.   On: 06/25/2019 11:47   Vas [place] Lower Extremity Venous (dvt)  Result Date: 06/25/2019  Lower Venous Study Indications: Right > Left leg/ hip pain.  Limitations: Patient movement/ confusion. Comparison Study: no prior Performing Technologist: June Leap RDMS, RVT  Examination Guidelines: A complete evaluation includes B-mode imaging, spectral Doppler, color Doppler, and power Doppler as needed of all accessible portions of each vessel. Bilateral testing is considered an integral part of a complete examination. Limited examinations for reoccurring indications may be performed as noted.  +---------+---------------+---------+-----------+----------+--------------+  RIGHT     Compressibility Phasicity Spontaneity Properties Thrombus Aging   +---------+---------------+---------+-----------+----------+--------------+  CFV       Full            Yes       Yes                                    +---------+---------------+---------+-----------+----------+--------------+  SFJ       Full                                                             +---------+---------------+---------+-----------+----------+--------------+  FV Prox   Full                                                             +---------+---------------+---------+-----------+----------+--------------+  FV Mid    Full                                                             +---------+---------------+---------+-----------+----------+--------------+  FV Distal Full                                                             +---------+---------------+---------+-----------+----------+--------------+  PFV       Full                                                             +---------+---------------+---------+-----------+----------+--------------+  POP       Full            Yes       Yes                                    +---------+---------------+---------+-----------+----------+--------------+  PTV       Full                                                             +---------+---------------+---------+-----------+----------+--------------+  PERO      Full                                                             +---------+---------------+---------+-----------+----------+--------------+   +---------+---------------+---------+-----------+----------+--------------+  LEFT      Compressibility Phasicity Spontaneity Properties Thrombus Aging  +---------+---------------+---------+-----------+----------+--------------+  CFV       Full            Yes       Yes                                    +---------+---------------+---------+-----------+----------+--------------+  SFJ       Full                                                              +---------+---------------+---------+-----------+----------+--------------+  FV Prox   Full                                                             +---------+---------------+---------+-----------+----------+--------------+  FV Mid    Full                                                             +---------+---------------+---------+-----------+----------+--------------+  FV Distal Full                                                             +---------+---------------+---------+-----------+----------+--------------+  PFV       Full                                                             +---------+---------------+---------+-----------+----------+--------------+  POP       Full            Yes       Yes                                    +---------+---------------+---------+-----------+----------+--------------+  PTV       Full                                                             +---------+---------------+---------+-----------+----------+--------------+  PERO      Full                                                             +---------+---------------+---------+-----------+----------+--------------+     Summary: Right: There is no evidence of deep vein thrombosis in the lower extremity. No cystic structure found in the popliteal fossa. Left: There is no evidence of deep vein thrombosis in the lower extremity. No cystic structure found in the popliteal fossa.  *See table(s) above for measurements and observations. Electronically signed by Curt Jews MD on 06/25/2019 at 8:47:41 AM.    Final         Scheduled Meds:  amLODipine  5 mg Oral Daily   Chlorhexidine Gluconate Cloth  6 each Topical Daily   divalproex  1,500 mg Oral QHS   donepezil  5 mg Oral QHS   feeding supplement (ENSURE ENLIVE)  237 mL Oral TID BM   heparin injection (subcutaneous)  5,000 Units Subcutaneous Q8H   levofloxacin  500 mg Oral Daily   multivitamin with minerals  1 tablet Oral Daily   tamsulosin   0.8 mg Oral QHS   Continuous Infusions:    LOS: 13 days    Time spent: 30 minutes with over 50% of the time coordinating the patient's care    Harold Hedge,  DO [facility] Pager [phone]  If 7PM-7AM, please contact night-coverage [email or web address] Password [email or web address] 06/26/2019, 1:35 PM

## 2019-06-27 DIAGNOSIS — R6520 Severe sepsis without septic shock: Secondary | ICD-10-CM

## 2019-06-27 LAB — BASIC METABOLIC PANEL
Anion gap: 10 (ref 5–15)
BUN: 12 mg/dL (ref 8–23)
CO2: 29 mmol/L (ref 22–32)
Calcium: 9 mg/dL (ref 8.9–10.3)
Chloride: 99 mmol/L (ref 98–111)
Creatinine, Ser: 1.13 mg/dL (ref 0.61–1.24)
GFR calc Af Amer: >60 mL/min (ref >60)
GFR calc non Af Amer: >60 mL/min (ref >60)
Glucose, Bld: 199 mg/dL — ABNORMAL HIGH (ref 70–99)
Potassium: 4.4 mmol/L (ref 3.5–5.1)
Sodium: 138 mmol/L (ref 135–145)

## 2019-06-27 LAB — CBC
HCT: 32.3 % — ABNORMAL LOW (ref 39.0–52.0)
Hemoglobin: 10.3 g/dL — ABNORMAL LOW (ref 13.0–17.0)
MCH: 31.1 pg (ref 26.0–34.0)
MCHC: 31.9 g/dL (ref 30.0–36.0)
MCV: 97.6 fL (ref 80.0–100.0)
Platelets: 522 10*3/uL — ABNORMAL HIGH (ref 150–400)
RBC: 3.31 MIL/uL — ABNORMAL LOW (ref 4.22–5.81)
RDW: 11.8 % (ref 11.5–15.5)
WBC: 10.4 10*3/uL (ref 4.0–10.5)
nRBC: 0 % (ref 0.0–0.2)

## 2019-06-27 LAB — CULTURE, BLOOD (ROUTINE X 2)
Culture: NO GROWTH
Culture: NO GROWTH
Special Requests: ADEQUATE
Special Requests: ADEQUATE

## 2019-06-27 LAB — VITAMIN D 25 HYDROXY (VIT D DEFICIENCY, FRACTURES): Vit D, 25-Hydroxy: 77.93 ng/mL (ref 30–100)

## 2019-06-27 MED ORDER — PREDNISONE 10 MG PO TABS: 10.0000 mg | ORAL_TABLET | Freq: Every day | ORAL | Status: DC

## 2019-06-27 MED ORDER — LEVOFLOXACIN 500 MG PO TABS: 500.0000 mg | ORAL_TABLET | Freq: Every day | ORAL | 0 refills | Status: AC

## 2019-06-27 NOTE — Progress Notes (Signed)
Removed PIV access x 2, Patient was going to [place] today.[facility] picked him up and took his all belongings include partial dentures. HS [employer]

## 2019-06-27 NOTE — Discharge Summary (Signed)
Physician Discharge Summary  Tony [name] [id number] DOB: 04-Sep-1950 DOA: 06/12/2019  PCP: Velna Ochs, MD  Admit date: 06/12/2019 Discharge date: 06/27/2019   Code Status: Full Code  Admitted From: Home Discharged to:  SNF Discharge Condition:Improving   Recommendations for Outpatient Follow-up   1. Follow up with PCP in 1 week 2. Please follow up BMP/CBC  3. Levaquin 500 mg daily for prostatitis until he follows up with Urology on 12/11 for TURP. This was discussed with Dr. Gloriann Loan 4. Statin discontinued in setting of myalgias, consider med rec outpatient 5. If patient continues to have myalgias after he has PT, consider low dose prednisone vs. Cymbalta  Hospital Summary  68 year old male with history of Alzheimer's dementia, chronic kidney disease stage III, diabetes mellitus type 2, hypertension, seizure disorder, hospitalization from 04/10/2019-04/15/2019 for altered mental status secondary to Enterococcus and procidentia UTI along with urinary retention with subsequent discharge home on Levaquin and indwelling Foley catheter with outpatient plans for TURP by urology on 07/10/2019 presented with subjective fevers and chills and generalized malaise on 06/12/2019. Foley catheter was changed on 06/13/2019 as per urology recommendations. Patient spiked a temperature of 102.6 on the evening of 06/19/2019; repeat labs and chest x-ray were obtained which suggested new right lower lobe opacity. He was transferred to [facility] on 06/22/2019 per family request.  Sepsis has since resolved and is currently being treated for HAP.  Patient has had complaints of worsening generalized pain most notably in bilateral hips with MRI showing generalized edema.  Orthopedic surgery was consulted who recommended conservative management as well as right knee joint aspiration for right knee effusion. Statin discontinued in setting of myalgias and was only held one day prior to discharge to SNF. This  was held at discharge and should be followed up outpatient. Patient discharged in stable condition to SNF.  A & P   Active Problems:   Hypertension   Seizure disorder (HCC)   HLD (hyperlipidemia)   Dementia (HCC)   Complicated UTI (urinary tract infection)   Sepsis secondary to UTI (HCC)   Acute renal failure superimposed on stage 3b chronic kidney disease (HCC)   BPH with obstruction/lower urinary tract symptoms   Pressure injury of skin   Sepsis ([place])   Acute metabolic encephalopathy  Sepsis secondary to suspected Citrobacter and Enterococcus CAUTI with chronic indwelling Foley: Present on admission, resolved. -Patient was treated with cefepime since admission and subsequently switched to cefdinir which was switched to Zosyn on 11/21 for suspected HAP after patient had spiking fevers as below. -Foley catheter was changed on 06/13/2019 as per urology recommendations. Outpatient follow-up with urology for TURP scheduled for 07/10/2019.   HAP  -afebrile and hemodynamically stable, resolved -Patient was started on Zosyn from 06/20/2019 onwards. Repeat blood cultures from 06/20/2019 are negative so far. Blood and urine cultures repeated on 06/22/2019. -CT of the chest/abdomen and pelvis on 06/22/2019 showed bilateral lower lobe atelectasis with small right and trace left pleural effusions and suspected cystitis. -Diet as per SLP evaluation.  Generalized myalgias with hip and bilateral thigh pain of unknown etiology -X-rays of the hip and femur: Unremarkable. -MRI of the hip and femursshow features of extensive myositis. Spoke to Dr. Swinteck/orthopedics who has evaluated the patient and recommend conservative management.  -CRP elevated, uric acid unremarkable. -CT venogram abdomen pelvis negative for DVT but concerning findings for prostatitis -will hold rosuvastatin as this can cause myalgia/myositis at discharge, this should be followed up outpatient - Consider Cymbalta or  Prednisone  if patient does not have improvement following PT at SNF  Acute kidney injury on chronic kidney disease stage III -Resolved with IV fluids and holding ACE   - Has chronic Foley - continue to hold lisinopril at discharge for 3 more days then restart  Prostatitis Afebrile with improving leukocytosis. Antibiotics as below during hospitalization - discharged on Levaquin 500 mg for 2 more weeks with last dose on 12/11 at which point patient has follow up with Dr. Gloriann Loan, urology, for TURP. This was discussed with urology  Hypernatremia  resolved  Essential hypertension -Continue amlodipine -Lisinopril on hold as above  Seizure disorder -continue Depakote  Alzheimer's dementia Generalized deconditioning -Continue donepezil. Apparently, brother has noted functional and cognitive decline over the last 6 months. -Palliative care consulted. Currently remains full code. -SNF at dc, consider long term care   BPH -Continue Flomax -Plan for TURP on 12/11 with Dr. Gloriann Loan  Stage II sacral pressure injury: Present on admission -Continue wound care  Coagulase-negative bacteremia, likely contaminant -On presentation.   Hyperkalemia  resolved   Consultants  . Palliative . Urology  Procedures  . none  Antibiotics   Anti-infectives (From admission, onward)   Start     Dose/Rate Route Frequency Ordered Stop   06/27/19 0000  levofloxacin (LEVAQUIN) 500 MG tablet     500 mg Oral Daily 06/27/19 0838 07/10/19 2359   06/26/19 1345  levofloxacin (LEVAQUIN) tablet 500 mg     500 mg Oral Daily 06/26/19 1333     06/20/19 2000  piperacillin-tazobactam (ZOSYN) IVPB 3.375 g  Status:  Discontinued     3.375 g 12.5 mL/hr over 240 Minutes Intravenous Every 8 hours 06/20/19 1102 06/26/19 1333   06/20/19 1200  piperacillin-tazobactam (ZOSYN) IVPB 3.375 g     3.375 g 100 mL/hr over 30 Minutes Intravenous  Once 06/20/19 1125 06/20/19 1628   06/17/19 1500  amoxicillin (AMOXIL)  capsule 500 mg  Status:  Discontinued     500 mg Oral Every 8 hours 06/17/19 1449 06/20/19 1102   06/17/19 1500  cefdinir (OMNICEF) capsule 300 mg  Status:  Discontinued     300 mg Oral Every 12 hours 06/17/19 1449 06/20/19 1102   06/17/19 1400  levofloxacin (LEVAQUIN) tablet 250 mg  Status:  Discontinued     250 mg Oral Daily 06/17/19 1235 06/17/19 1449   06/16/19 2200  amoxicillin-clavulanate (AUGMENTIN) 875-125 MG per tablet 1 tablet  Status:  Discontinued     1 tablet Oral Every 12 hours 06/16/19 1524 06/17/19 1235   06/16/19 1245  cefdinir (OMNICEF) capsule 300 mg  Status:  Discontinued     300 mg Oral Every 12 hours 06/16/19 1231 06/17/19 1235   06/16/19 1245  amoxicillin-clavulanate (AUGMENTIN) 500-125 MG per tablet 500 mg  Status:  Discontinued     1 tablet Oral Every 12 hours 06/16/19 1231 06/16/19 1524   06/14/19 1400  vancomycin (VANCOCIN) IVPB 1000 mg/200 mL premix  Status:  Discontinued     1,000 mg 200 mL/hr over 60 Minutes Intravenous Every 24 hours 06/13/19 1102 06/13/19 1111   06/14/19 0912  ceFEPIme (MAXIPIME) 2 g in sodium chloride 0.9 % 100 mL IVPB  Status:  Discontinued     2 g 200 mL/hr over 30 Minutes Intravenous Every 24 hours 06/13/19 1111 06/16/19 1231   06/13/19 1600  vancomycin (VANCOCIN) IVPB 1000 mg/200 mL premix  Status:  Discontinued     1,000 mg 200 mL/hr over 60 Minutes Intravenous Every 24 hours 06/13/19 1111 06/15/19  1005   06/13/19 1130  vancomycin (VANCOCIN) IVPB 1000 mg/200 mL premix  Status:  Discontinued     1,000 mg 200 mL/hr over 60 Minutes Intravenous Every 1 hr x 2 06/13/19 1102 06/13/19 1109   06/12/19 2200  ceFEPIme (MAXIPIME) 2 g in sodium chloride 0.9 % 100 mL IVPB  Status:  Discontinued     2 g 200 mL/hr over 30 Minutes Intravenous Every 12 hours 06/12/19 1530 06/13/19 1111   06/12/19 1300  vancomycin (VANCOCIN) IVPB 1000 mg/200 mL premix  Status:  Discontinued     1,000 mg 200 mL/hr over 60 Minutes Intravenous Every 1 hr x 2 06/12/19  1215 06/12/19 1431   06/12/19 1215  ceFEPIme (MAXIPIME) 2 g in sodium chloride 0.9 % 100 mL IVPB     2 g 200 mL/hr over 30 Minutes Intravenous  Once 06/12/19 1207 06/12/19 1336   06/12/19 1215  metroNIDAZOLE (FLAGYL) IVPB 500 mg  Status:  Discontinued     500 mg 100 mL/hr over 60 Minutes Intravenous  Once 06/12/19 1207 06/12/19 1442   06/12/19 1215  vancomycin (VANCOCIN) IVPB 1000 mg/200 mL premix  Status:  Discontinued     1,000 mg 200 mL/hr over 60 Minutes Intravenous  Once 06/12/19 1207 06/12/19 1215   06/12/19 1200  cefTRIAXone (ROCEPHIN) 1 g in sodium chloride 0.9 % 100 mL IVPB  Status:  Discontinued     1 g 200 mL/hr over 30 Minutes Intravenous  Once 06/12/19 1154 06/12/19 1207        Subjective  Patient seen and examined at bedside no acute distress and resting comfortably.  No events overnight.  Tolerating diet. In good spirits and anticipating discharge. Admits to persistent myalgias.  Otherwise: Denies any chest pain, shortness of breath, fever, nausea. Otherwise full ROS negative   Objective   Discharge Exam: Vitals:   06/26/19 1959 06/27/19 0726  BP: 139/80 135/82  Pulse: 92 90  Resp: 12   Temp: 98.1 F (36.7 C) 99.3 F (37.4 C)  SpO2: 96% 97%   Vitals:   06/26/19 0802 06/26/19 1614 06/26/19 1959 06/27/19 0726  BP: 136/84 (!) 147/75 139/80 135/82  Pulse:  93 92 90  Resp:   12   Temp: 98.9 F (37.2 C) 99 F (37.2 C) 98.1 F (36.7 C) 99.3 F (37.4 C)  TempSrc: Oral Oral Axillary Oral  SpO2: 100% 96% 96% 97%  Weight:      Height:        Physical Exam Vitals signs and nursing note reviewed.  Constitutional:      Appearance: Normal appearance.  HENT:     Head: Normocephalic and atraumatic.     Nose: Nose normal.     Mouth/Throat:     Mouth: Mucous membranes are moist.  Eyes:     Extraocular Movements: Extraocular movements intact.  Neck:     Musculoskeletal: Normal range of motion. No neck rigidity.  Cardiovascular:     Rate and Rhythm: Normal  rate and regular rhythm.  Pulmonary:     Effort: Pulmonary effort is normal.     Breath sounds: Normal breath sounds.  Abdominal:     General: Abdomen is flat.     Palpations: Abdomen is soft.  Genitourinary:    Comments: Chronic foley draining clear urine Musculoskeletal:     Comments: Mild generalized tenderness to palpation  Neurological:     Mental Status: He is alert. Mental status is at baseline.  Psychiatric:  Mood and Affect: Mood normal.        Behavior: Behavior normal.       The results of significant diagnostics from this hospitalization (including imaging, microbiology, ancillary and laboratory) are listed below for reference.     Microbiology: Recent Results (from the past 240 hour(s))  SARS CORONAVIRUS 2 (TAT 6-24 HRS) Nasopharyngeal Nasopharyngeal Swab     Status: None   Collection Time: 06/19/19 11:59 AM   Specimen: Nasopharyngeal Swab  Result Value Ref Range Status   SARS Coronavirus 2 NEGATIVE NEGATIVE Final    Comment: (NOTE) SARS-CoV-2 target nucleic acids are NOT DETECTED. The SARS-CoV-2 RNA is generally detectable in upper and lower respiratory specimens during the acute phase of infection. Negative results do not preclude SARS-CoV-2 infection, do not rule out co-infections with other pathogens, and should not be used as the sole basis for treatment or other patient management decisions. Negative results must be combined with clinical observations, patient history, and epidemiological information. The expected result is Negative. Fact Sheet for Patients: SugarRoll.be Fact Sheet for Healthcare Providers: https://www.woods-mathews.com/ This test is not yet approved or cleared by the [place] FDA and  has been authorized for detection and/or diagnosis of SARS-CoV-2 by FDA under an Emergency Use Authorization (EUA). This EUA will remain  in effect (meaning this test can be used) for the duration of  the COVID-19 declaration under Section 56 4(b)(1) of the Act, 21 U.S.C. section 360bbb-3(b)(1), unless the authorization is terminated or revoked sooner. Performed at [facility], [place] [address], [place], [place] [address]   Culture, Urine     Status: None   Collection Time: 06/19/19  3:24 PM   Specimen: Urine, Clean Catch  Result Value Ref Range Status   Specimen Description   Final    URINE, CLEAN CATCH Performed at [facility], [address] [place], [place] [address]    Special Requests   Final    NONE Performed at [facility], [address], [place], [place] [address]    Culture   Final    NO GROWTH Performed at [facility], [place] [address], [place], [place] [address]    Report Status 06/20/2019 FINAL  Final  Culture, blood (routine x 2)     Status: None   Collection Time: 06/20/19 12:48 PM   Specimen: Right Antecubital; Blood  Result Value Ref Range Status   Specimen Description   Final    RIGHT ANTECUBITAL BOTTLES DRAWN AEROBIC AND ANAEROBIC   Special Requests Blood Culture adequate volume  Final   Culture   Final    NO GROWTH 5 DAYS Performed at [facility], [address], [place], [place] [address]    Report Status 06/25/2019 FINAL  Final  Culture, blood (routine x 2)     Status: None   Collection Time: 06/20/19 12:56 PM   Specimen: BLOOD LEFT WRIST  Result Value Ref Range Status   Specimen Description BLOOD LEFT WRIST BOTTLES DRAWN AEROBIC ONLY  Final   Special Requests Blood Culture adequate volume  Final   Culture   Final    NO GROWTH 5 DAYS Performed at [facility], [address], [place], [place] [address]    Report Status 06/25/2019 FINAL  Final  MRSA PCR Screening     Status: None   Collection Time: 06/20/19  4:05 PM   Specimen: Nasal Mucosa; Nasopharyngeal  Result Value Ref Range Status   MRSA by PCR NEGATIVE NEGATIVE Final    Comment:  The GeneXpert MRSA Assay (FDA approved for NASAL specimens only), is one component of  a comprehensive MRSA colonization surveillance program. It is not intended to diagnose MRSA infection nor to guide or monitor treatment for MRSA infections. Performed at [facility], [address], [place], [place] [address]   SARS CORONAVIRUS 2 (TAT 6-24 HRS) Nasopharyngeal Nasopharyngeal Swab     Status: None   Collection Time: 06/21/19  4:10 PM   Specimen: Nasopharyngeal Swab  Result Value Ref Range Status   SARS Coronavirus 2 NEGATIVE NEGATIVE Final    Comment: (NOTE) SARS-CoV-2 target nucleic acids are NOT DETECTED. The SARS-CoV-2 RNA is generally detectable in upper and lower respiratory specimens during the acute phase of infection. Negative results do not preclude SARS-CoV-2 infection, do not rule out co-infections with other pathogens, and should not be used as the sole basis for treatment or other patient management decisions. Negative results must be combined with clinical observations, patient history, and epidemiological information. The expected result is Negative. Fact Sheet for Patients: SugarRoll.be Fact Sheet for Healthcare Providers: https://www.woods-mathews.com/ This test is not yet approved or cleared by the [place] FDA and  has been authorized for detection and/or diagnosis of SARS-CoV-2 by FDA under an Emergency Use Authorization (EUA). This EUA will remain  in effect (meaning this test can be used) for the duration of the COVID-19 declaration under Section 56 4(b)(1) of the Act, 21 U.S.C. section 360bbb-3(b)(1), unless the authorization is terminated or revoked sooner. Performed at [facility], [place] [address] [place], [place] [address]   Culture, Urine     Status: None   Collection Time: 06/22/19  9:37 AM   Specimen: Urine, Clean Catch  Result Value Ref Range Status   Specimen Description   Final    URINE, CLEAN CATCH Performed at [facility], [address] [place], [place] [address]     Special Requests   Final    NONE Performed at [facility], [address] [place], [place] [address]    Culture   Final    NO GROWTH Performed at [facility], [place] [address], [place], [place] [address]    Report Status 06/23/2019 FINAL  Final  Culture, blood (routine x 2)     Status: None   Collection Time: 06/22/19  1:45 PM   Specimen: BLOOD RIGHT ARM  Result Value Ref Range Status   Specimen Description BLOOD RIGHT ARM  Final   Special Requests   Final    BOTTLES DRAWN AEROBIC AND ANAEROBIC Blood Culture adequate volume   Culture   Final    NO GROWTH 5 DAYS Performed at [facility], [address], [place], [place] [address]    Report Status 06/27/2019 FINAL  Final  Culture, blood (routine x 2)     Status: None   Collection Time: 06/22/19  1:45 PM   Specimen: BLOOD RIGHT ARM  Result Value Ref Range Status   Specimen Description BLOOD RIGHT ARM  Final   Special Requests   Final    BOTTLES DRAWN AEROBIC AND ANAEROBIC Blood Culture adequate volume   Culture   Final    NO GROWTH 5 DAYS Performed at [facility], [address], [place],  [address]    Report Status 06/27/2019 FINAL  Final  SARS CORONAVIRUS 2 (TAT 6-24 HRS) Nasopharyngeal Nasopharyngeal Swab     Status: None   Collection Time: 06/25/19  4:17 PM   Specimen: Nasopharyngeal Swab  Result Value Ref Range Status  SARS Coronavirus 2 NEGATIVE NEGATIVE Final    Comment: (NOTE) SARS-CoV-2 target nucleic acids are NOT DETECTED. The SARS-CoV-2 RNA is generally detectable in upper and lower respiratory specimens during the acute phase of infection. Negative results do not preclude SARS-CoV-2 infection, do not rule out co-infections with other pathogens, and should not be used as the sole basis for treatment or other patient management decisions. Negative results must be combined with clinical observations, patient history, and epidemiological information. The expected result is Negative. Fact Sheet for  Patients: SugarRoll.be Fact Sheet for Healthcare Providers: https://www.woods-mathews.com/ This test is not yet approved or cleared by the [place] FDA and  has been authorized for detection and/or diagnosis of SARS-CoV-2 by FDA under an Emergency Use Authorization (EUA). This EUA will remain  in effect (meaning this test can be used) for the duration of the COVID-19 declaration under Section 56 4(b)(1) of the Act, 21 U.S.C. section 360bbb-3(b)(1), unless the authorization is terminated or revoked sooner. Performed at [facility], [place] [address], [place], [place] [address]      Labs: BNP (last 3 results) No results for input(s): BNP in the last 8760 hours. Basic Metabolic Panel: Recent Labs  Lab 06/22/19 0616 06/23/19 0801 06/24/19 0215 06/25/19 0219 06/26/19 0209 06/27/19 0219  NA 141 143 143 141 141 138  K 3.9 4.3 4.1 4.2 5.2* 4.4  CL 99 102 103 101 100 99  CO2 31 31 29  32 28 29  GLUCOSE 127* 146* 129* 135* 129* 199*  BUN 17 20 18 12 14 12   CREATININE 1.53* 1.66* 1.52* 1.43* 1.31* 1.13  CALCIUM 8.8* 9.0 9.1 8.8* 9.1 9.0  MG 2.1 2.2 2.1 2.0 2.3  --    Liver Function Tests: Recent Labs  Lab 06/23/19 0801 06/24/19 0215  AST 46* 43*  ALT 29 28  ALKPHOS 35* 40  BILITOT 0.6 0.9  PROT 6.9 7.1  ALBUMIN 2.0* 2.1*   No results for input(s): LIPASE, AMYLASE in the last 168 hours. No results for input(s): AMMONIA in the last 168 hours. CBC: Recent Labs  Lab 06/23/19 0801 06/24/19 0215 06/25/19 0219 06/26/19 0209 06/27/19 0219  WBC 13.9* 13.5* 13.6* 11.6* 10.4  NEUTROABS 9.5* 9.3* 9.4* 8.0*  --   HGB 10.9* 12.8* 10.3* 11.7* 10.3*  HCT 33.6* 40.1 31.7* 37.0* 32.3*  MCV 98.0 99.5 97.8 100.0 97.6  PLT 358 355 420* 407* 522*   Cardiac Enzymes: Recent Labs  Lab 06/26/19 0209  CKTOTAL 142   BNP: Invalid input(s): POCBNP CBG: Recent Labs  Lab 06/23/19 0742  GLUCAP 145*   D-Dimer No results for  input(s): DDIMER in the last 72 hours. Hgb A1c No results for input(s): HGBA1C in the last 72 hours. Lipid Profile No results for input(s): CHOL, HDL, LDLCALC, TRIG, CHOLHDL, LDLDIRECT in the last 72 hours. Thyroid function studies No results for input(s): TSH, T4TOTAL, T3FREE, THYROIDAB in the last 72 hours.  Invalid input(s): FREET3 Anemia work up No results for input(s): VITAMINB12, FOLATE, FERRITIN, TIBC, IRON, RETICCTPCT in the last 72 hours. Urinalysis    Component Value Date/Time   COLORURINE YELLOW 06/22/2019 0937   APPEARANCEUR HAZY (A) 06/22/2019 0937   LABSPEC 1.020 06/22/2019 0937   PHURINE 5.0 06/22/2019 0937   GLUCOSEU NEGATIVE 06/22/2019 0937   HGBUR SMALL (A) 06/22/2019 0937   BILIRUBINUR NEGATIVE 06/22/2019 [place] 06/22/2019 0937   PROTEINUR 100 (A) 06/22/2019 0937   UROBILINOGEN 0.2 02/07/2010 1547   NITRITE NEGATIVE 06/22/2019 [id number]  LEUKOCYTESUR TRACE (A) 06/22/2019 0937   Sepsis Labs Invalid input(s): PROCALCITONIN,  WBC,  LACTICIDVEN Microbiology Recent Results (from the past 240 hour(s))  SARS CORONAVIRUS 2 (TAT 6-24 HRS) Nasopharyngeal Nasopharyngeal Swab     Status: None   Collection Time: 06/19/19 11:59 AM   Specimen: Nasopharyngeal Swab  Result Value Ref Range Status   SARS Coronavirus 2 NEGATIVE NEGATIVE Final    Comment: (NOTE) SARS-CoV-2 target nucleic acids are NOT DETECTED. The SARS-CoV-2 RNA is generally detectable in upper and lower respiratory specimens during the acute phase of infection. Negative results do not preclude SARS-CoV-2 infection, do not rule out co-infections with other pathogens, and should not be used as the sole basis for treatment or other patient management decisions. Negative results must be combined with clinical observations, patient history, and epidemiological information. The expected result is Negative. Fact Sheet for Patients: SugarRoll.be Fact Sheet for  Healthcare Providers: https://www.woods-mathews.com/ This test is not yet approved or cleared by the [place] FDA and  has been authorized for detection and/or diagnosis of SARS-CoV-2 by FDA under an Emergency Use Authorization (EUA). This EUA will remain  in effect (meaning this test can be used) for the duration of the COVID-19 declaration under Section 56 4(b)(1) of the Act, 21 U.S.C. section 360bbb-3(b)(1), unless the authorization is terminated or revoked sooner. Performed at [facility], [place] [address], [place], [place] [address]   Culture, Urine     Status: None   Collection Time: 06/19/19  3:24 PM   Specimen: Urine, Clean Catch  Result Value Ref Range Status   Specimen Description   Final    URINE, CLEAN CATCH Performed at [facility], [address], [place], [place] [address]    Special Requests   Final    NONE Performed at [facility], [address], [place], [place] [address]    Culture   Final    NO GROWTH Performed at [facility], [place] [address] [place], [place] [address]    Report Status 06/20/2019 FINAL  Final  Culture, blood (routine x 2)     Status: None   Collection Time: 06/20/19 12:48 PM   Specimen: Right Antecubital; Blood  Result Value Ref Range Status   Specimen Description   Final    RIGHT ANTECUBITAL BOTTLES DRAWN AEROBIC AND ANAEROBIC   Special Requests Blood Culture adequate volume  Final   Culture   Final    NO GROWTH 5 DAYS Performed at [facility], [address], [place], [place] [address]    Report Status 06/25/2019 FINAL  Final  Culture, blood (routine x 2)     Status: None   Collection Time: 06/20/19 12:56 PM   Specimen: BLOOD LEFT WRIST  Result Value Ref Range Status   Specimen Description BLOOD LEFT WRIST BOTTLES DRAWN AEROBIC ONLY  Final   Special Requests Blood Culture adequate volume  Final   Culture   Final    NO GROWTH 5 DAYS Performed at [facility], [address], [place], [place]  [address]    Report Status 06/25/2019 FINAL  Final  MRSA PCR Screening     Status: None   Collection Time: 06/20/19  4:05 PM   Specimen: Nasal Mucosa; Nasopharyngeal  Result Value Ref Range Status   MRSA by PCR NEGATIVE NEGATIVE Final    Comment:        The GeneXpert MRSA Assay (FDA approved for NASAL specimens only), is one component of a comprehensive MRSA colonization surveillance program.  It is not intended to diagnose MRSA infection nor to guide or monitor treatment for MRSA infections. Performed at [facility], [address] [place], [place] [address]   SARS CORONAVIRUS 2 (TAT 6-24 HRS) Nasopharyngeal Nasopharyngeal Swab     Status: None   Collection Time: 06/21/19  4:10 PM   Specimen: Nasopharyngeal Swab  Result Value Ref Range Status   SARS Coronavirus 2 NEGATIVE NEGATIVE Final    Comment: (NOTE) SARS-CoV-2 target nucleic acids are NOT DETECTED. The SARS-CoV-2 RNA is generally detectable in upper and lower respiratory specimens during the acute phase of infection. Negative results do not preclude SARS-CoV-2 infection, do not rule out co-infections with other pathogens, and should not be used as the sole basis for treatment or other patient management decisions. Negative results must be combined with clinical observations, patient history, and epidemiological information. The expected result is Negative. Fact Sheet for Patients: SugarRoll.be Fact Sheet for Healthcare Providers: https://www.woods-mathews.com/ This test is not yet approved or cleared by the [place] FDA and  has been authorized for detection and/or diagnosis of SARS-CoV-2 by FDA under an Emergency Use Authorization (EUA). This EUA will remain  in effect (meaning this test can be used) for the duration of the COVID-19 declaration under Section 56 4(b)(1) of the Act, 21 U.S.C. section 360bbb-3(b)(1), unless the authorization is terminated or revoked  sooner. Performed at [facility], [place] [address], [place], [place] [address]   Culture, Urine     Status: None   Collection Time: 06/22/19  9:37 AM   Specimen: Urine, Clean Catch  Result Value Ref Range Status   Specimen Description   Final    URINE, CLEAN CATCH Performed at [facility], [address] [place], [place] [address]    Special Requests   Final    NONE Performed at [facility], [address], [place], [place] [address]    Culture   Final    NO GROWTH Performed at [facility], [place] [address] [place], [place] [address]    Report Status 06/23/2019 FINAL  Final  Culture, blood (routine x 2)     Status: None   Collection Time: 06/22/19  1:45 PM   Specimen: BLOOD RIGHT ARM  Result Value Ref Range Status   Specimen Description BLOOD RIGHT ARM  Final   Special Requests   Final    BOTTLES DRAWN AEROBIC AND ANAEROBIC Blood Culture adequate volume   Culture   Final    NO GROWTH 5 DAYS Performed at [facility], [address], [place], [place] [address]    Report Status 06/27/2019 FINAL  Final  Culture, blood (routine x 2)     Status: None   Collection Time: 06/22/19  1:45 PM   Specimen: BLOOD RIGHT ARM  Result Value Ref Range Status   Specimen Description BLOOD RIGHT ARM  Final   Special Requests   Final    BOTTLES DRAWN AEROBIC AND ANAEROBIC Blood Culture adequate volume   Culture   Final    NO GROWTH 5 DAYS Performed at [facility], [address] [place],  [address]    Report Status 06/27/2019 FINAL  Final  SARS CORONAVIRUS 2 (TAT 6-24 HRS) Nasopharyngeal Nasopharyngeal Swab     Status: None   Collection Time: 06/25/19  4:17 PM   Specimen: Nasopharyngeal Swab  Result Value Ref Range Status   SARS Coronavirus 2 NEGATIVE NEGATIVE Final    Comment: (NOTE) SARS-CoV-2 target nucleic acids are NOT DETECTED. The  SARS-CoV-2 RNA is generally detectable in upper and lower respiratory specimens during the acute phase of infection.  Negative results do not preclude SARS-CoV-2 infection, do not rule out co-infections with other pathogens, and should not be used as the sole basis for treatment or other patient management decisions. Negative results must be combined with clinical observations, patient history, and epidemiological information. The expected result is Negative. Fact Sheet for Patients: SugarRoll.be Fact Sheet for Healthcare Providers: https://www.woods-mathews.com/ This test is not yet approved or cleared by the [place] FDA and  has been authorized for detection and/or diagnosis of SARS-CoV-2 by FDA under an Emergency Use Authorization (EUA). This EUA will remain  in effect (meaning this test can be used) for the duration of the COVID-19 declaration under Section 56 4(b)(1) of the Act, 21 U.S.C. section 360bbb-3(b)(1), unless the authorization is terminated or revoked sooner. Performed at [facility], [place] [address], [place], [place] [address]     Discharge Instructions     Discharge Instructions    Diet - low sodium heart healthy   Complete by: As directed    Discharge instructions   Complete by: As directed    You were seen and examined in the hospital for sepsis and cared for by a hospitalist.   Upon Discharge:  -Stop taking Lisinopril for the next 3 days and then restart -Take Levaquin 500 mg daily to completion with last dose on 12/11 when you follow up with your urologist -Follow up with Dr. Gloriann Loan, Urology on 12/11 for TURP -Get lab work 1 week post discharge from nursing facility Make an appointment with your primary care physician within 7 days Get lab work prior to your follow up appointment with your PCP Bring all home medications to your appointment to review Request that your primary physician go over all hospital tests and procedures/radiological results at the follow up.   Please get all hospital records sent to your  physician by signing a hospital release before you go home.     Read the complete instructions along with all the possible side effects for all the medicines you take and that have been prescribed to you. Take any new medicines after you have completely understood and accept all the possible adverse reactions/side effects.   If you have any questions about your discharge medications or the care you received while you were in the hospital, you can call the unit and asked to speak with the hospitalist on call. Once you are discharged, your primary care physician will handle any further medical issues. Please note that NO REFILLS for any discharge medications will be authorized, as it is imperative that you return to your primary care physician (or establish a relationship with a primary care physician if you do not have one) for your aftercare needs so that they can reassess your need for medications and monitor your lab values.   Do not drive, operate heavy machinery, perform activities at heights, swimming or participation in water activities or provide baby sitting services if your were admitted for loss of consciousness/seizures or if you are on sedating medications including, but not limited to benzodiazepines, sleep medications, narcotic pain medications, etc., until you have been cleared to do so by a medical doctor.   Do not take more than prescribed medications.   Wear a seat belt while driving.  If you have smoked or chewed Tobacco in the last 2 years please stop smoking; also stop any regular Alcohol and/or any Recreational drug  use including marijuana.  If you experience worsening of your admission symptoms or develop shortness of breath, chest pain, suicidal or homicidal thoughts or experience a life threatening emergency, you must seek medical attention immediately by calling 911 or calling your PCP immediately.   Increase activity slowly   Complete by: As directed      Allergies  as of 06/27/2019   No Known Allergies     Medication List    STOP taking these medications   cabergoline 0.5 MG tablet Commonly known as: DOSTINEX   rosuvastatin 20 MG tablet Commonly known as: Crestor     TAKE these medications   amLODipine 5 MG tablet Commonly known as: NORVASC Take 1 tablet (5 mg total) by mouth daily.   divalproex 500 MG 24 hr tablet Commonly known as: DEPAKOTE ER Take 2 tablets (1,000 mg total) by mouth daily. What changed:   how much to take  when to take this   donepezil 5 MG tablet Commonly known as: ARICEPT Take 1 tablet (5 mg total) by mouth at bedtime.   levofloxacin 500 MG tablet Commonly known as: LEVAQUIN Take 1 tablet (500 mg total) by mouth daily for 13 days.   lisinopril 20 MG tablet Commonly known as: ZESTRIL Take 20 mg by mouth daily.   tamsulosin 0.4 MG Caps capsule Commonly known as: FLOMAX Take 1 capsule (0.4 mg total) by mouth daily after breakfast. What changed:   how much to take  when to take this   Vitamin D (Ergocalciferol) 1.25 MG (50000 UT) Caps capsule Commonly known as: DRISDOL Take 50,000 Units by mouth once a week. Friday      Contact information for after-discharge care    Destination    [facility] .   Service: Skilled Nursing Contact information: [id number] [place] [phone]             No Known Allergies  Time coordinating discharge: Over 30 minutes  SIGNED:   Harold Hedge, D.O. [facility] Pager: [phone]  06/27/2019, 8:50 AM

## 2019-06-27 NOTE — TOC Transition Note (Signed)
Transition of Care [facility]) - CM/SW Discharge Note   Patient Details  Name: Tony [name] MRN: [id number] Date of Birth: 1950-10-04  Transition of [facility]) CM/SW Contact:  Alberteen Sam, [place] Phone Number: [phone] 06/27/2019, 8:47 AM   Clinical Narrative:     Patient will DC to: [facility]  Anticipated DC date: 06/27/2019 Family notified: [name] [facility] [id number]  Per MD patient ready for DC to [place] . RN, patient, patient's family, and facility notified of DC. Discharge Summary sent to facility. RN given number for report [phone]   . DC packet on chart. Ambulance transport requested for patient.  CSW signing off.  [place] Kaela Beitz, [place]   Final next level of care: Skilled Nursing Facility Barriers to Discharge: No Barriers Identified   Patient Goals and CMS Choice Patient states their goals for this hospitalization and ongoing recovery are:: To complete rehab and return home. CMS Medicare.gov Compare Post Acute Care list provided to:: Patient Represenative (must comment)([name] (brother)) Choice offered to / list presented to : Sibling  Discharge Placement PASRR number recieved: 06/18/19            Patient chooses bed at: [facility] Patient to be transferred to facility by: [facility] Name of family member notified: [name] Patient and family notified of of transfer: 06/27/19  Discharge Plan and Services     Post Acute Care Choice: [facility]                               Social Determinants of Health (SDOH) Interventions     Readmission Risk Interventions No flowsheet data found.

## 2019-07-08 NOTE — Progress Notes (Signed)
Spoke to [employer] to obtain medical documentation, However Vito Backers stated that she would be off rest of the week, and to fax instructions to facility.  Attempted to call the facility to see who would be responsible for implementing instructions for patient. Following number contacted. [phone] number for the facility (no answer)  Also contacted surgeon's office and left voice message to see if they had a different contact number-Awaiting reply at this time.  In addition contacted Sherle Poe, pt's brother, who is also the [facility] at [facility]. [name] was only able to provide name, and phone number of transportation company. He did not have contact number for anyone directly at [facility].     Instructions faxed to [phone] with instructions for facility to contact pre-op upon receipt of fax, so that pre-op staff could review instructions with nursing home facility.

## 2019-07-08 NOTE — Progress Notes (Addendum)
Preop instructions for: Tony [name]   Date of Birth: [birth year]                            Date of Procedure:   07-10-19    Doctor:  Link Snuffer   Time to arrive at [facility]: 9:15 AM Report to: Admitting  Procedure: Transuretheral Resection of the Prostate   Do not eat or drink past midnight the night before your procedure.(To include any tube feedings-must be discontinued)   Take these morning medications only with sips of water.(or give through gastrostomy or feeding tube). Divalpraex (Depakote), and Amlodipine  Note: No Insulin or Diabetic meds should be given or taken the morning of the procedure!   Facility contact: [employer]    Phone:  [place] POA: Sherle Poe [phone]  Transportation contact phone#: [facility] [place] [phone]  Please send day of procedure:current med list and meds last taken that day, confirm nothing by mouth status from what time, Patient Demographic info( to include DNR status, problem list, allergies)   RN contact name/phone#: Catalina Lunger, RN, DON [phone]                         and Fax #: [phone]  Bring Insurance card and picture ID Leave all jewelry and other valuables at place where living( no metal or rings to be worn) No contact lens Women-no make-up, no lotions,perfumes,powders Men-no colognes,lotions  Any questions day of procedure,call  [facility]-[phone]   Sent from :[facility]                   Phone:[phone]                   [phone]  Sent by :  Janyth Pupa,  RN

## 2019-07-09 NOTE — Progress Notes (Signed)
Attempted to call [facility] to reach Director to see if they received the instructions -let it ring 20 times with no answer.

## 2019-07-09 NOTE — Progress Notes (Signed)
LM on VM for Curlene Labrum, Assistant Director [facility] to please call back and let me know if they received pre-op instructions for patient yesterday for his surgery 07/10/2019 and they understand those instructions.

## 2019-07-09 NOTE — Progress Notes (Signed)
LM on VM for Tony [name] at [facility] to call me back to see if she received pre-op instructions for the patient  for tomorrow.

## 2019-07-10 ENCOUNTER — Encounter (HOSPITAL_COMMUNITY)
Admission: RE | Disposition: A | Discharge status: Rehabilitation Facility | Payer: Self-pay | Source: Home / Self Care | Attending: Urology

## 2019-07-10 ENCOUNTER — Other Ambulatory Visit (HOSPITAL_COMMUNITY): Payer: Self-pay | Admitting: *Deleted

## 2019-07-10 ENCOUNTER — Encounter (HOSPITAL_COMMUNITY): Payer: Self-pay | Admitting: Urology

## 2019-07-10 ENCOUNTER — Ambulatory Visit (HOSPITAL_COMMUNITY): Payer: Medicare (Managed Care) | Admitting: Physician Assistant

## 2019-07-10 ENCOUNTER — Observation Stay (HOSPITAL_COMMUNITY)
Admission: RE | Admit: 2019-07-10 | Discharge: 2019-07-14 | Disposition: A | Discharge status: Rehabilitation Facility | Payer: Medicare (Managed Care) | Attending: Urology | Admitting: Urology

## 2019-07-10 ENCOUNTER — Other Ambulatory Visit (HOSPITAL_COMMUNITY): Payer: Medicare (Managed Care)

## 2019-07-10 ENCOUNTER — Other Ambulatory Visit: Payer: Self-pay

## 2019-07-10 DIAGNOSIS — N289 Disorder of kidney and ureter, unspecified: Secondary | ICD-10-CM | POA: Diagnosis not present

## 2019-07-10 DIAGNOSIS — G40909 Epilepsy, unspecified, not intractable, without status epilepticus: Secondary | ICD-10-CM | POA: Diagnosis not present

## 2019-07-10 DIAGNOSIS — I10 Essential (primary) hypertension: Secondary | ICD-10-CM | POA: Insufficient documentation

## 2019-07-10 DIAGNOSIS — R338 Other retention of urine: Secondary | ICD-10-CM | POA: Diagnosis not present

## 2019-07-10 DIAGNOSIS — F0280 Dementia in other diseases classified elsewhere without behavioral disturbance: Secondary | ICD-10-CM | POA: Insufficient documentation

## 2019-07-10 DIAGNOSIS — G309 Alzheimer's disease, unspecified: Secondary | ICD-10-CM | POA: Diagnosis not present

## 2019-07-10 DIAGNOSIS — N138 Other obstructive and reflux uropathy: Secondary | ICD-10-CM | POA: Diagnosis not present

## 2019-07-10 DIAGNOSIS — N401 Enlarged prostate with lower urinary tract symptoms: Secondary | ICD-10-CM | POA: Insufficient documentation

## 2019-07-10 DIAGNOSIS — E119 Type 2 diabetes mellitus without complications: Secondary | ICD-10-CM | POA: Diagnosis not present

## 2019-07-10 DIAGNOSIS — Z79899 Other long term (current) drug therapy: Secondary | ICD-10-CM | POA: Diagnosis not present

## 2019-07-10 DIAGNOSIS — Z8673 Personal history of transient ischemic attack (TIA), and cerebral infarction without residual deficits: Secondary | ICD-10-CM | POA: Diagnosis not present

## 2019-07-10 DIAGNOSIS — C61 Malignant neoplasm of prostate: Principal | ICD-10-CM | POA: Insufficient documentation

## 2019-07-10 DIAGNOSIS — Z20828 Contact with and (suspected) exposure to other viral communicable diseases: Secondary | ICD-10-CM | POA: Diagnosis not present

## 2019-07-10 DIAGNOSIS — N320 Bladder-neck obstruction: Secondary | ICD-10-CM | POA: Insufficient documentation

## 2019-07-10 DIAGNOSIS — Z87440 Personal history of urinary (tract) infections: Secondary | ICD-10-CM | POA: Diagnosis not present

## 2019-07-10 DIAGNOSIS — R339 Retention of urine, unspecified: Secondary | ICD-10-CM | POA: Diagnosis present

## 2019-07-10 DIAGNOSIS — E7800 Pure hypercholesterolemia, unspecified: Secondary | ICD-10-CM | POA: Diagnosis not present

## 2019-07-10 HISTORY — PX: TRANSURETHRAL RESECTION OF PROSTATE: SHX73

## 2019-07-10 LAB — TYPE AND SCREEN
ABO/RH(D): A POS
Antibody Screen: NEGATIVE

## 2019-07-10 LAB — GLUCOSE, CAPILLARY
Glucose-Capillary: 110 mg/dL — ABNORMAL HIGH (ref 70–99)
Glucose-Capillary: 100 mg/dL — ABNORMAL HIGH (ref 70–99)
Glucose-Capillary: 102 mg/dL — ABNORMAL HIGH (ref 70–99)

## 2019-07-10 LAB — RESPIRATORY PANEL BY RT PCR (FLU A&B, COVID)
Influenza A by PCR: NEGATIVE
Influenza B by PCR: NEGATIVE
SARS Coronavirus 2 by RT PCR: NEGATIVE

## 2019-07-10 SURGERY — TURP (TRANSURETHRAL RESECTION OF PROSTATE)
Anesthesia: General | Site: Prostate

## 2019-07-10 MED ORDER — ACETAMINOPHEN 500 MG PO TABS
1000.0000 mg | ORAL_TABLET | Freq: Once | ORAL | Status: AC
  Administered 2019-07-10: 1000 mg via ORAL
  Filled 2019-07-10: qty 2

## 2019-07-10 MED ORDER — SENNOSIDES-DOCUSATE SODIUM 8.6-50 MG PO TABS
2.0000 | ORAL_TABLET | Freq: Every day | ORAL | Status: DC
  Administered 2019-07-10 – 2019-07-13 (×4): 2 via ORAL
  Filled 2019-07-10 (×4): qty 2

## 2019-07-10 MED ORDER — FENTANYL CITRATE (PF) 100 MCG/2ML IJ SOLN
INTRAMUSCULAR | Status: DC | PRN
  Administered 2019-07-10 (×2): 50 ug via INTRAVENOUS

## 2019-07-10 MED ORDER — ONDANSETRON HCL 4 MG/2ML IJ SOLN
INTRAMUSCULAR | Status: DC | PRN
  Administered 2019-07-10: 4 mg via INTRAVENOUS

## 2019-07-10 MED ORDER — PHENYLEPHRINE 40 MCG/ML (10ML) SYRINGE FOR IV PUSH (FOR BLOOD PRESSURE SUPPORT)
PREFILLED_SYRINGE | INTRAVENOUS | Status: DC | PRN
  Administered 2019-07-10: 80 ug via INTRAVENOUS
  Administered 2019-07-10 (×2): 120 ug via INTRAVENOUS
  Administered 2019-07-10: 200 ug via INTRAVENOUS
  Administered 2019-07-10: 160 ug via INTRAVENOUS
  Administered 2019-07-10 (×2): 80 ug via INTRAVENOUS

## 2019-07-10 MED ORDER — DIPHENHYDRAMINE HCL 50 MG/ML IJ SOLN: 12.5000 mg | Freq: Four times a day (QID) | INTRAMUSCULAR | Status: DC | PRN

## 2019-07-10 MED ORDER — PROPOFOL 10 MG/ML IV BOLUS
INTRAVENOUS | Status: DC | PRN
  Administered 2019-07-10: 70 mg via INTRAVENOUS

## 2019-07-10 MED ORDER — ACETAMINOPHEN 325 MG PO TABS: 650.0000 mg | ORAL_TABLET | ORAL | Status: DC | PRN

## 2019-07-10 MED ORDER — SODIUM CHLORIDE 0.9 % IV SOLN
INTRAVENOUS | Status: DC
  Administered 2019-07-10: 18:00:00 via INTRAVENOUS

## 2019-07-10 MED ORDER — FENTANYL CITRATE (PF) 100 MCG/2ML IJ SOLN: 25.0000 ug | INTRAMUSCULAR | Status: DC | PRN

## 2019-07-10 MED ORDER — AMLODIPINE BESYLATE 5 MG PO TABS
5.0000 mg | ORAL_TABLET | Freq: Every day | ORAL | Status: DC
  Administered 2019-07-11 – 2019-07-14 (×3): 5 mg via ORAL
  Filled 2019-07-10 (×4): qty 1

## 2019-07-10 MED ORDER — SUCCINYLCHOLINE CHLORIDE 20 MG/ML IJ SOLN
INTRAMUSCULAR | Status: DC | PRN
  Administered 2019-07-10: 100 mg via INTRAVENOUS

## 2019-07-10 MED ORDER — HYDROCODONE-ACETAMINOPHEN 5-325 MG PO TABS: 1.0000 | ORAL_TABLET | ORAL | 0 refills | Status: DC | PRN

## 2019-07-10 MED ORDER — OXYBUTYNIN CHLORIDE 5 MG PO TABS: 5.0000 mg | ORAL_TABLET | Freq: Three times a day (TID) | ORAL | Status: DC | PRN

## 2019-07-10 MED ORDER — OXYCODONE HCL 5 MG PO TABS: 5.0000 mg | ORAL_TABLET | ORAL | Status: DC | PRN

## 2019-07-10 MED ORDER — LISINOPRIL 20 MG PO TABS
20.0000 mg | ORAL_TABLET | Freq: Every day | ORAL | Status: DC
  Administered 2019-07-11 – 2019-07-14 (×4): 20 mg via ORAL
  Filled 2019-07-10 (×4): qty 1

## 2019-07-10 MED ORDER — PHENYLEPHRINE HCL-NACL 10-0.9 MG/250ML-% IV SOLN
INTRAVENOUS | Status: DC | PRN
  Administered 2019-07-10: 25 ug/min via INTRAVENOUS

## 2019-07-10 MED ORDER — SODIUM CHLORIDE 0.9 % IR SOLN
Status: DC | PRN
  Administered 2019-07-10 (×9): 3000 mL

## 2019-07-10 MED ORDER — DIVALPROEX SODIUM ER 500 MG PO TB24
1500.0000 mg | ORAL_TABLET | Freq: Every day | ORAL | Status: DC
  Administered 2019-07-11 – 2019-07-14 (×4): 1500 mg via ORAL
  Filled 2019-07-10 (×4): qty 3

## 2019-07-10 MED ORDER — PROPOFOL 10 MG/ML IV BOLUS
INTRAVENOUS | Status: AC
  Filled 2019-07-10: qty 20

## 2019-07-10 MED ORDER — SUGAMMADEX SODIUM 200 MG/2ML IV SOLN
INTRAVENOUS | Status: DC | PRN
  Administered 2019-07-10: 200 mg via INTRAVENOUS

## 2019-07-10 MED ORDER — ONDANSETRON HCL 4 MG/2ML IJ SOLN: 4.0000 mg | INTRAMUSCULAR | Status: DC | PRN

## 2019-07-10 MED ORDER — ONDANSETRON HCL 4 MG/2ML IJ SOLN: 4.0000 mg | Freq: Once | INTRAMUSCULAR | Status: DC | PRN

## 2019-07-10 MED ORDER — ZOLPIDEM TARTRATE 5 MG PO TABS: 5.0000 mg | ORAL_TABLET | Freq: Every evening | ORAL | Status: DC | PRN

## 2019-07-10 MED ORDER — 0.9 % SODIUM CHLORIDE (POUR BTL) OPTIME
TOPICAL | Status: DC | PRN
  Administered 2019-07-10: 1000 mL

## 2019-07-10 MED ORDER — BELLADONNA ALKALOIDS-OPIUM 16.2-60 MG RE SUPP: 1.0000 | Freq: Four times a day (QID) | RECTAL | Status: DC | PRN

## 2019-07-10 MED ORDER — DIPHENHYDRAMINE HCL 12.5 MG/5ML PO ELIX: 12.5000 mg | ORAL_SOLUTION | Freq: Four times a day (QID) | ORAL | Status: DC | PRN

## 2019-07-10 MED ORDER — LACTATED RINGERS IV SOLN
INTRAVENOUS | Status: DC
  Administered 2019-07-10 (×2): via INTRAVENOUS

## 2019-07-10 MED ORDER — ORAL CARE MOUTH RINSE
15.0000 mL | Freq: Two times a day (BID) | OROMUCOSAL | Status: DC
  Administered 2019-07-10 – 2019-07-14 (×8): 15 mL via OROMUCOSAL

## 2019-07-10 MED ORDER — DEXAMETHASONE SODIUM PHOSPHATE 10 MG/ML IJ SOLN
INTRAMUSCULAR | Status: DC | PRN
  Administered 2019-07-10: 4 mg via INTRAVENOUS

## 2019-07-10 MED ORDER — SODIUM CHLORIDE 0.9 % IR SOLN
3000.0000 mL | Status: DC
  Administered 2019-07-10: 3000 mL

## 2019-07-10 MED ORDER — DONEPEZIL HCL 5 MG PO TABS
5.0000 mg | ORAL_TABLET | Freq: Every day | ORAL | Status: DC
  Administered 2019-07-10 – 2019-07-13 (×4): 5 mg via ORAL
  Filled 2019-07-10 (×4): qty 1

## 2019-07-10 MED ORDER — LIDOCAINE 2% (20 MG/ML) 5 ML SYRINGE
INTRAMUSCULAR | Status: DC | PRN
  Administered 2019-07-10: 60 mg via INTRAVENOUS

## 2019-07-10 MED ORDER — PHENYLEPHRINE 40 MCG/ML (10ML) SYRINGE FOR IV PUSH (FOR BLOOD PRESSURE SUPPORT)
PREFILLED_SYRINGE | INTRAVENOUS | Status: AC
  Filled 2019-07-10: qty 30

## 2019-07-10 MED ORDER — CEFAZOLIN SODIUM-DEXTROSE 2-4 GM/100ML-% IV SOLN
2.0000 g | INTRAVENOUS | Status: AC
  Administered 2019-07-10: 2 g via INTRAVENOUS
  Filled 2019-07-10: qty 100

## 2019-07-10 MED ORDER — ROCURONIUM BROMIDE 10 MG/ML (PF) SYRINGE
PREFILLED_SYRINGE | INTRAVENOUS | Status: DC | PRN
  Administered 2019-07-10: 30 mg via INTRAVENOUS
  Administered 2019-07-10: 10 mg via INTRAVENOUS

## 2019-07-10 MED ORDER — FENTANYL CITRATE (PF) 100 MCG/2ML IJ SOLN
INTRAMUSCULAR | Status: AC
  Filled 2019-07-10: qty 2

## 2019-07-10 MED ORDER — DULOXETINE HCL 30 MG PO CPEP
30.0000 mg | ORAL_CAPSULE | Freq: Two times a day (BID) | ORAL | Status: DC
  Administered 2019-07-10 – 2019-07-14 (×8): 30 mg via ORAL
  Filled 2019-07-10 (×8): qty 1

## 2019-07-10 MED ORDER — MORPHINE SULFATE (PF) 2 MG/ML IV SOLN: 2.0000 mg | INTRAVENOUS | Status: DC | PRN

## 2019-07-10 SURGICAL SUPPLY — 18 items
BAG URINE DRAIN 2000ML AR STRL (UROLOGICAL SUPPLIES) ×6 IMPLANT
BAG URO CATCHER STRL LF (MISCELLANEOUS) ×3 IMPLANT
CATH FOLEY 3WAY 30CC 24FR (CATHETERS) ×4
CATH URTH STD 24FR FL 3W 2 (CATHETERS) ×2 IMPLANT
CLOTH BEACON ORANGE TIMEOUT ST (SAFETY) ×3 IMPLANT
ELECT REM PT RETURN 15FT ADLT (MISCELLANEOUS) ×3 IMPLANT
GLOVE BIO SURGEON STRL SZ7.5 (GLOVE) ×3 IMPLANT
GOWN STRL REUS W/TWL XL LVL3 (GOWN DISPOSABLE) ×3 IMPLANT
HOLDER FOLEY CATH W/STRAP (MISCELLANEOUS) ×3 IMPLANT
KIT TURNOVER KIT A (KITS) IMPLANT
LOOP CUT BIPOLAR 24F LRG (ELECTROSURGICAL) ×3 IMPLANT
MANIFOLD NEPTUNE II (INSTRUMENTS) ×3 IMPLANT
PACK CYSTO (CUSTOM PROCEDURE TRAY) ×3 IMPLANT
PENCIL SMOKE EVACUATOR (MISCELLANEOUS) IMPLANT
SYRINGE IRR TOOMEY STRL 70CC (SYRINGE) ×3 IMPLANT
TUBING CONNECTING 10 (TUBING) ×2 IMPLANT
TUBING CONNECTING 10' (TUBING) ×1
TUBING UROLOGY SET (TUBING) ×3 IMPLANT

## 2019-07-10 NOTE — Progress Notes (Addendum)
Nurse from [facility] called Short stay to inform of BP 80/57.   Nurse states pt. In chair and does not have any symptoms of lower BP.  Other VSS.   Informed to contact Dr. Gloriann Loan who is surgeon today and inform him of hypotension.  Pt. Prior BP 94/66 to 134/68.  0930 called [facility] to get update.  Nurse states MD will access once at [facility].  Informed nurse that Short stay was expecting pt. At [place] and will need to be transported as soon as possible. Nurse states that transport is walking in the building to transport Tony [name] to [facility].

## 2019-07-10 NOTE — Anesthesia Preprocedure Evaluation (Addendum)
Anesthesia Evaluation  Patient identified by MRN, date of birth, ID band Patient awake    Reviewed: Allergy & Precautions, H&P , NPO status , Patient's Chart, lab work & pertinent test results  Airway Mallampati: II  TM Distance: >3 FB Neck ROM: full    Dental  (+) Poor Dentition, Dental Advisory Given,    Pulmonary    Pulmonary exam normal breath sounds clear to auscultation       Cardiovascular hypertension, Pt. on medications Normal cardiovascular exam Rhythm:regular Rate:Normal     Neuro/Psych Seizures -, Well Controlled,  PSYCHIATRIC DISORDERS Dementia Alzheimer's- very confused, answers "yes" to everything at baseline per caregivers. Lives in full time facility   GI/Hepatic negative GI ROS, Neg liver ROS,   Endo/Other  diabetes, Type 2  Renal/GU Renal InsufficiencyRenal diseaseLast Cr 1.13   BPH    Musculoskeletal negative musculoskeletal ROS (+)   Abdominal Normal abdominal exam  (+)   Peds  Hematology   Anesthesia Other Findings Very confused, refusing airway exam  Reproductive/Obstetrics negative OB ROS                           Anesthesia Physical  Anesthesia Plan  ASA: III  Anesthesia Plan: General   Post-op Pain Management:    Induction: Intravenous  PONV Risk Score and Plan: 2 and Ondansetron, Dexamethasone and Treatment may vary due to age or medical condition  Airway Management Planned: Oral ETT  Additional Equipment: None  Intra-op Plan:   Post-operative Plan: Extubation in OR  Informed Consent: I have reviewed the patients History and Physical, chart, labs and discussed the procedure including the risks, benefits and alternatives for the proposed anesthesia with the patient or authorized representative who has indicated his/her understanding and acceptance.     Dental advisory given and Consent reviewed with POA  Plan Discussed with: CRNA  Anesthesia  Plan Comments: (Anesthesia consent d/w brother/POA Joya Martyr. Explained that general anesthesia can often times exacerbate dementia for an unknown period of time postoperatively. )     Anesthesia Quick Evaluation

## 2019-07-10 NOTE — Progress Notes (Signed)
Patient arrived by transport agency. Pt. Confused and resistant for COVID test. patinet states his surgery next week.   Able to state name but no other questions.  Was able to complete with assistance from another staff member.   Brother- Sherle Poe was admitted in room; in which pt. Recognized him by name.  Pt. Attempted to make "x" for signature- signed by POA Sherle Poe.  Anesthesia able to discuss procedure and anesthesia for surgery with Brother [name].  Has understanding.   Called [facility] to verify any medications pt. Had today (07-09-2020) see preop sheet.  Nurse also verified nothing by mouth since last night except for the 2 medications this morning at 0800.

## 2019-07-10 NOTE — Discharge Instructions (Signed)

## 2019-07-10 NOTE — Transfer of Care (Signed)
Immediate Anesthesia Transfer of Care Note  Patient: Tony [name]  Procedure(s) Performed: TRANSURETHRAL RESECTION OF THE PROSTATE (TURP) (N/A Prostate)  Patient Location: PACU  Anesthesia Type:General  Level of Consciousness: sedated  Airway & Oxygen Therapy: Patient Spontanous Breathing and Patient connected to face mask oxygen  Post-op Assessment: Report given to RN and Post -op Vital signs reviewed and stable  Post vital signs: Reviewed and stable  Last Vitals:  Vitals Value Taken Time  BP    Temp    Pulse 88 07/10/19 1502  Resp    SpO2 100 % 07/10/19 1502  Vitals shown include unvalidated device data.  Last Pain:  Vitals:   07/10/19 1215  TempSrc: Oral  PainSc: 0-No pain         Complications: No apparent anesthesia complications

## 2019-07-10 NOTE — Op Note (Signed)
Preoperative diagnosis: 1. Bladder outlet obstruction secondary to BPH  Postoperative diagnosis:  1. Bladder outlet obstruction secondary to BPH  Procedure:  1. Cystoscopy 2. Transurethral resection of the prostate  Surgeon: Marton Redwood, III. M.D.  Anesthesia: General  Complications: None  EBL: Minimal  Specimens: 1. Prostate chips  Indication: MONTARIUS GRIBBINS is a patient with bladder outlet obstruction secondary to benign prostatic hyperplasia. After reviewing the management options for treatment, he elected to proceed with the above surgical procedure(s). We have discussed the potential benefits and risks of the procedure, side effects of the proposed treatment, the likelihood of the patient achieving the goals of the procedure, and any potential problems that might occur during the procedure or recuperation. Informed consent has been obtained.  Description of procedure:  The patient was taken to the operating room and general anesthesia was induced.  The patient was placed in the dorsal lithotomy position, prepped and draped in the usual sterile fashion, and preoperative antibiotics were administered. A preoperative time-out was performed.   Cystourethroscopy was performed.  The patient's urethra was examined and demonstrated bilobar prostatic hypertrophy with a very large ball-valve intravesical median lobe .   The bladder was then systematically examined in its entirety. There was no evidence of any bladder tumors, stones, or other mucosal pathology.  The ureteral orifices were identified and marked so as to be avoided during the procedure.  The prostate adenoma was then resected utilizing loop cautery resection with the bipolar cutting loop.  The prostate adenoma median lobe was first resected followed by the bladder neck back to the verumontanum was resected beginning at the six o'clock position and then extended to include the right and left lobes of the prostate and  anterior prostate. Care was taken not to resect distal to the verumontanum.   Hemostasis was then achieved with the cautery and the bladder was emptied and reinspected with no significant bleeding noted at the end of the procedure.    A 51F 3 way catheter was then placed into the bladder.  The patient appeared to tolerate the procedure well and without complications.  The patient was able to be awakened and transferred to the recovery unit in satisfactory condition.

## 2019-07-10 NOTE — Anesthesia Postprocedure Evaluation (Signed)
Anesthesia Post Note  Patient: Tony [name]  Procedure(s) Performed: TRANSURETHRAL RESECTION OF THE PROSTATE (TURP) (N/A Prostate)     Patient location during evaluation: PACU Anesthesia Type: General Level of consciousness: awake and alert, oriented and patient cooperative Pain management: pain level controlled Vital Signs Assessment: post-procedure vital signs reviewed and stable Respiratory status: spontaneous breathing, nonlabored ventilation and respiratory function stable Cardiovascular status: blood pressure returned to baseline and stable Postop Assessment: no apparent nausea or vomiting Anesthetic complications: no    Last Vitals:  Vitals:   07/10/19 1515 07/10/19 1530  BP: 130/78 109/66  Pulse: 88 93  Resp: 14 11  Temp:  36.8 C  SpO2: 100% 98%    Last Pain:  Vitals:   07/10/19 1215  TempSrc: Oral  PainSc: 0-No pain                 [name]

## 2019-07-10 NOTE — H&P (Signed)
CC/HPI: CC: Urinary retention  HPI:   05/01/19: 68 year old male recently admitted to the hospital for UTI sepsis. Urine culture was positive for enterococcus and providentia. He completed Levaquin. He states that prior to the urinary tract infection, he was voiding well with tamsulosin. He had no complaints that time. Two weeks ago, he was found to have altered mental status and urinary incontinence and he was transported to the hospital. He was found to have UTI sepsis and urinary retention. He has had a Foley catheter since then.    05/07/19: Patient with above noted history. He returns today for TOV. He states that he has been tolerating the catheter well. He denies painful urgency or leakage. He denies gross hematuria, fevers, or chills. He has been using 0.8 mg Tamsulosin and tolerating increased dosage well.   05/22/2019  Patient failed his voiding trial last visit. He presents for another voiding trial and possible cystoscopy.     ALLERGIES: No Allergies    MEDICATIONS: Tamsulosin Hcl 0.4 mg capsule 2 capsule PO Daily  Amlodipine Besylate 5 mg tablet  Donepezil Hcl 5 mg tablet  Rosuvastatin Calcium 5 mg tablet  Vitamin D2     GU PSH: No GU PSH    NON-GU PSH: No Non-GU PSH    GU PMH: Urinary Retention - 05/01/2019    NON-GU PMH: Hypercholesterolemia Hypertension Seizure disorder Stroke/TIA    FAMILY HISTORY: 2 sons - Other Kidney Failure - Mother   SOCIAL HISTORY: Marital Status: Single Preferred Language: English; Race: White Drinks 2 caffeinated drinks per day.    REVIEW OF SYSTEMS:    GU Review Male:   Patient denies frequent urination, hard to postpone urination, burning/ pain with urination, get up at night to urinate, leakage of urine, stream starts and stops, trouble starting your stream, have to strain to urinate , erection problems, and penile pain.  Gastrointestinal (Upper):   Patient denies nausea, vomiting, and indigestion/ heartburn.  Gastrointestinal  (Lower):   Patient denies diarrhea and constipation.  Constitutional:   Patient denies fever, night sweats, weight loss, and fatigue.  Skin:   Patient denies skin rash/ lesion and itching.  Eyes:   Patient denies blurred vision and double vision.  Ears/ Nose/ Throat:   Patient denies sore throat and sinus problems.  Hematologic/Lymphatic:   Patient denies swollen glands and easy bruising.  Cardiovascular:   Patient denies leg swelling and chest pains.  Respiratory:   Patient denies cough and shortness of breath.  Endocrine:   Patient denies excessive thirst.  Musculoskeletal:   Patient denies joint pain and back pain.  Neurological:   Patient denies headaches and dizziness.  Psychologic:   Patient denies depression and anxiety.   VITAL SIGNS:      05/22/2019 10:16 AM  BP 115/72 mmHg  Heart Rate 89 /min  Temperature 97.3 F / 36.2 C   MULTI-SYSTEM PHYSICAL EXAMINATION:    Constitutional: Well-nourished. No physical deformities. Normally developed. Good grooming. Ambulates with walker   Respiratory: No labored breathing, no use of accessory muscles.   Cardiovascular: Normal temperature, adequate perfusion of extremities  Skin: No paleness, no jaundice  Neurologic / Psychiatric: Oriented to time, oriented to place, oriented to person. No depression, no anxiety, no agitation.  Gastrointestinal: No mass, no tenderness, no rigidity, non obese abdomen.  Eyes: Normal conjunctivae. Normal eyelids.  Musculoskeletal: Normal gait and station of head and neck.     PAST DATA REVIEWED:  Source Of History:  Patient  Records Review:  Previous Patient Records   PROCEDURES:         Flexible Cystoscopy - 52000  Risks, benefits, and some of the potential complications of the procedure were discussed at length with the patient including infection, bleeding, voiding discomfort, urinary retention, fever, chills, sepsis, and others. All questions were answered. Informed consent was obtained. Antibiotic  prophylaxis was given. Sterile technique and intraurethral analgesia were used.  Meatus:  Normal size. Normal location. Normal condition.  Urethra:  No strictures.  External Sphincter:  Normal.  Verumontanum:  Normal.  Prostate:  Obstructing. Enlarged median lobe. Severe hyperplasia. About 4-5 cm  Bladder Neck:  Non-obstructing.  Ureteral Orifices:  Normal location. Normal size. Normal shape.   Bladder:  No trabeculation. No tumors. Normal mucosa. No stones.      The lower urinary tract was carefully examined. The procedure was well-tolerated and without complications. Antibiotic instructions were given. Instructions were given to call the office immediately for bloody urine, difficulty urinating, urinary retention, painful or frequent urination, fever, chills, nausea, vomiting or other illness. The patient stated that he understood these instructions and would comply with them.        Voiding Trial - 51700  Instilled Volume: 200 cc  Voided Volume: 0 cc   ASSESSMENT:      ICD-10 Details  1 GU:   BPH w/LUTS - N40.1   2   Urinary Retention - R33.8 Stable  3   Encounter for Prostate Cancer screening - Z12.5    PLAN:           Orders Labs PSA          Document Letter(s):  Created for Patient: Clinical Summary         Notes:   PSA was drawn prior to cystoscopy.   The patient has failed medical management for his lower urinary tract symptoms. He would like to proceed with surgical resection. I discussed bipolar transurethral resection of the prostate. I specifically discussed the risks including but not limited to bleeding which could require blood transfusion, infection, and injury to surrounding structures. Also discussed the possibility that the surgery would not improve symptoms though most men have a great improvement in their symptoms. Also discussed the low likelihood but possibility of development of new symptoms such as irritative voiding symptoms or urinary incontinence. Most  men will have some degree of urinary urgency and discomfort immediately following the surgery that resolves in a short amount of time. He understands that most often this is an outpatient procedure but occasionally patients require hospitalization for continuous bladder irrigation in the event of excess bleeding. He also understands the possibility of being sent home with a urethral catheter. The patient expressed understanding and is eager to proceed.   We will proceed as long as PSA is normal. If PSA is elevated, recommend biopsy prior to proceeding.   CC: Linda Hedges

## 2019-07-10 NOTE — Anesthesia Procedure Notes (Signed)
Date/Time: 07/10/2019 2:53 PM Performed by: Cynda Familia, CRNA Oxygen Delivery Method: Simple face mask Placement Confirmation: positive ETCO2 and breath sounds checked- equal and bilateral Dental Injury: Teeth and Oropharynx as per pre-operative assessment

## 2019-07-10 NOTE — Progress Notes (Signed)
Called [facility] again to see if pt. Has left facility.  Nurse Catalina Lunger states transport is at facility now and will be here before 11am.  If not will need to reschedule surgery.

## 2019-07-10 NOTE — Anesthesia Procedure Notes (Signed)
Procedure Name: Intubation Performed by: Philbert Ocallaghan J, CRNA Pre-anesthesia Checklist: Patient identified, Emergency Drugs available, Suction available, Patient being monitored and Timeout performed Patient Re-evaluated:Patient Re-evaluated prior to induction Oxygen Delivery Method: Circle system utilized Preoxygenation: Pre-oxygenation with 100% oxygen Induction Type: IV induction Ventilation: Mask ventilation without difficulty Laryngoscope Size: Mac and 4 Grade View: Grade I Tube type: Oral Tube size: 7.5 mm Number of attempts: 1 Airway Equipment and Method: Stylet Placement Confirmation: ETT inserted through vocal cords under direct vision,  positive ETCO2,  CO2 detector and breath sounds checked- equal and bilateral Secured at: 23 cm Tube secured with: Tape Dental Injury: Teeth and Oropharynx as per pre-operative assessment        

## 2019-07-11 DIAGNOSIS — C61 Malignant neoplasm of prostate: Secondary | ICD-10-CM | POA: Diagnosis not present

## 2019-07-11 MED ORDER — CHLORHEXIDINE GLUCONATE CLOTH 2 % EX PADS
6.0000 | MEDICATED_PAD | Freq: Every day | CUTANEOUS | Status: DC
  Administered 2019-07-11 – 2019-07-13 (×3): 6 via TOPICAL

## 2019-07-11 NOTE — Progress Notes (Signed)
PT MEWS yellow for RR and LOC. Primary RN made aware to initiate protocol.

## 2019-07-11 NOTE — Progress Notes (Signed)
Patient ID: Tony [name], male   DOB: [birth year], 68 y.o.   MRN: [id number]  1 Day Post-Op Subjective: No complaints.  Objective: Vital signs in last 24 hours: Temp:  [96.4 F (35.8 C)-98.2 F (36.8 C)] 98 F (36.7 C) (12/12 0456) Pulse Rate:  [80-96] 87 (12/12 0456) Resp:  [11-20] 20 (12/12 0456) BP: (109-145)/(66-111) 141/99 (12/12 0456) SpO2:  [98 %-100 %] 100 % (12/12 0456) Weight:  [86.3 kg] 86.3 kg (12/11 1215)  Intake/Output from previous day: 12/11 0701 - 12/12 0700 In: 9950.7 [P.O.:250; I.V.:2600.7; IV Piggyback:100] Out: 8501 [Urine:8400; Stool:1; Blood:100] Intake/Output this shift: No intake/output data recorded.  Physical Exam:  General: Alert and oriented Abd: Soft, NT GU: Urine clear with CBI off  Lab Results:  Studies/Results: No results found.  Assessment/Plan: POD # 1 s/p TURP - D/C back to SNF today with catheter   LOS: 0 days   Tony [name] 07/11/2019, 9:49 AM

## 2019-07-11 NOTE — TOC Progression Note (Addendum)
Transition of Care [facility]) - Progression Note    Patient Details  Name: Tony [name] MRN: [id number] Date of Birth: 1951-05-18  Transition of Care [facility]) CM/SW Contact  Servando Snare, [place] Phone Number: 07/11/2019, 1:05 PM  Clinical Narrative:   LCSW has attempted multiple times to reach someone at facility to confirm patients return. LCSW left message awaiting call back. Faxed dc summary and sent message in Epic.          Expected Discharge Plan and Services           Expected Discharge Date: 07/11/19                                     Social Determinants of Health (SDOH) Interventions    Readmission Risk Interventions No flowsheet data found.

## 2019-07-11 NOTE — Progress Notes (Signed)
Received handoff from Vera Williams, RN, during 7p shift change. 

## 2019-07-11 NOTE — Progress Notes (Signed)
Pt with orders for discharge. SW made aware re making arrangements for discharge. Call received from brother Chriss Czar) not wanting pt to return to [facility] as, according to brother, he has had issues with facility in respect to care pt had received at the facility. [name] reported that pt had lost a lot of weight and was not appropriately being cared for at facility. For these and other reasons, [name] did not want pt to return and asked to speak with the Education officer, museum. Bernette, clinical SW made aware.  VWilliams,RN.

## 2019-07-11 NOTE — Discharge Summary (Signed)
  Date of admission: 07/10/2019  Date of discharge: 07/11/2019  Admission diagnosis: Bladder outlet obstruction  Discharge diagnosis: Bladder outlet obstruction  Secondary diagnoses: Seizure disorder, dementia, hypertension  History and Physical: For full details, please see admission history and physical. Briefly, Tony [name] is a 68 y.o. year old patient with bladder outlet obstruction.   Hospital Course: He underwent TURP for BOO on 07/10/19.  His urine was clear off CBI on POD #1.  He was discharged with a catheter back to his chronic nursing facility.  Disposition: Home  Discharge instruction: The patient was instructed to be ambulatory but told to refrain from heavy lifting, strenuous activity, or driving.   Discharge medications:  Allergies as of 07/11/2019   No Known Allergies     Medication List    TAKE these medications   amLODipine 5 MG tablet Commonly known as: NORVASC Take 1 tablet (5 mg total) by mouth daily.   Decubi-Vite Caps Take 1 capsule by mouth daily.   divalproex 500 MG 24 hr tablet Commonly known as: DEPAKOTE ER Take 2 tablets (1,000 mg total) by mouth daily. What changed: how much to take   donepezil 5 MG tablet Commonly known as: ARICEPT Take 1 tablet (5 mg total) by mouth at bedtime.   DULoxetine 30 MG capsule Commonly known as: CYMBALTA Take 30 mg by mouth 2 (two) times daily.   feeding supplement (PRO-STAT SUGAR FREE 64) Liqd Take 30 mLs by mouth 2 (two) times daily.   HYDROcodone-acetaminophen 5-325 MG tablet Commonly known as: NORCO/VICODIN Take 1 tablet by mouth every 4 (four) hours as needed for moderate pain.   lisinopril 20 MG tablet Commonly known as: ZESTRIL Take 20 mg by mouth daily.   tamsulosin 0.4 MG Caps capsule Commonly known as: FLOMAX Take 1 capsule (0.4 mg total) by mouth daily after breakfast.   Vitamin D (Ergocalciferol) 1.25 MG (50000 UT) Caps capsule Commonly known as: DRISDOL Take 50,000 Units by mouth  every Friday.     ASK your doctor about these medications   levofloxacin 500 MG tablet Commonly known as: LEVAQUIN Take 1 tablet (500 mg total) by mouth daily for 13 days. Ask about: Should I take this medication?       Followup:  Follow-up Information    Lucas Mallow, MD Follow up.   Specialty: Urology Why: As scheduled Contact information: [address] [place]  [address] [phone]

## 2019-07-12 DIAGNOSIS — C61 Malignant neoplasm of prostate: Secondary | ICD-10-CM | POA: Diagnosis not present

## 2019-07-12 NOTE — TOC Progression Note (Signed)
Transition of Care [facility]) - Progression Note    Patient Details  Name: Tony [name] MRN: [id number] Date of Birth: Feb 25, 1951  Transition of Care [facility]) CM/SW Contact  Servando Snare, [place] Phone Number: 07/12/2019, 11:28 AM  Clinical Narrative:    LCSW spoke with patients brother, [name]. Brother does not want patient to return to [facility]. Brother states he has spoken with [facility] who is agreeable for patient to change facilities. LCSW started new work up to have patient at new facility for rehab.         Expected Discharge Plan and Services           Expected Discharge Date: 07/11/19                                     Social Determinants of Health (SDOH) Interventions    Readmission Risk Interventions No flowsheet data found.

## 2019-07-12 NOTE — Progress Notes (Signed)
Bp meds held for low blood pressure today. Pt also did not eat much of his meals as he kept refusing. Fluids pushed instead.  VWilliams,RN.

## 2019-07-12 NOTE — TOC Progression Note (Signed)
Transition of Care [facility]) - Progression Note    Patient Details  Name: Tony [name] MRN: [id number] Date of Birth: 05/15/1951  Transition of Care [facility]) CM/SW Contact  Servando Snare, [place] Phone Number: 07/12/2019, 9:00 AM  Clinical Narrative:   LCSW called main number at [facility]. Receptionist stated someone will be in the admissions office at 10. LCSW will follow up.          Expected Discharge Plan and Services           Expected Discharge Date: 07/11/19                                     Social Determinants of Health (SDOH) Interventions    Readmission Risk Interventions No flowsheet data found.

## 2019-07-12 NOTE — NC FL2 (Signed)
[place] LEVEL OF CARE SCREENING TOOL     IDENTIFICATION  Patient Name: Tony [name] Birthdate: [birth year] Sex: male Admission Date (Current Location): 07/10/2019  [facility] and [place] Number:  [place] [facility]([place]) Facility and Address:  [facility],  [place], [place]      Provider Number: [id number]  Attending Physician Name and Address:  Lucas Mallow, MD  Relative Name and Phone Number:  Giankarlo Shisler, [phone]    Current Level of Care: Hospital Recommended Level of Care: [place] Prior Approval Number:    Date Approved/Denied:   PASRR Number: [id number]  Discharge Plan: SNF    Current Diagnoses: Patient Active Problem List   Diagnosis Date Noted  . Urinary retention 07/10/2019  . Acute metabolic encephalopathy [id number]  . Sepsis ([place])   . Pressure injury of skin 06/13/2019  . Complicated UTI (urinary tract infection) 06/12/2019  . Sepsis secondary to UTI ([place]) 06/12/2019  . Acute renal failure superimposed on stage 3b chronic kidney disease ([place]) 06/12/2019  . BPH with obstruction/lower urinary tract symptoms 06/12/2019  . Sepsis with acute renal failure without septic shock ([place])   . Acute urinary retention   . UTI (urinary tract infection) 04/10/2019  . Acute UTI 04/10/2019  . Seizure ([place]) 12/15/2018  . CAP (community acquired pneumonia) 12/14/2018  . Dementia ([place]) 10/20/2018  . Enlarged prostate without lower urinary tract symptoms (luts) 06/30/2018  . Hyperprolactinemia ([place]) 11/20/2017  . HLD (hyperlipidemia) 09/02/2016  . Chronic kidney disease 03/19/2016  . Gynecomastia, male 04/25/2015  . Routine health maintenance 04/25/2015  . Erectile disorder due to medical condition in male patient 05/07/2014  . Hypertension 12/12/2011  . Prediabetes 12/12/2011  . Seizure disorder ([place]) 12/12/2011    Orientation RESPIRATION BLADDER Height & Weight     Self  Normal  Indwelling catheter Weight: 190 lb 4.1 oz (86.3 kg) Height:  5\' 11"  (180.3 cm)  BEHAVIORAL SYMPTOMS/MOOD NEUROLOGICAL BOWEL NUTRITION STATUS      Incontinent Diet(See dc summary)  AMBULATORY STATUS COMMUNICATION OF NEEDS Skin   Extensive Assist Verbally PU Stage and Appropriate Care(Stage II: scrotum, posterior; buttocks, right)                       Personal Care Assistance Level of Assistance  Bathing, Feeding, Dressing Bathing Assistance: Maximum assistance Feeding assistance: Limited assistance Dressing Assistance: Maximum assistance     Functional Limitations Info  Sight, Hearing, Speech Sight Info: Adequate Hearing Info: Adequate Speech Info: Adequate    SPECIAL CARE FACTORS FREQUENCY  PT (By licensed PT)     PT Frequency: 5x/week              Contractures Contractures Info: Not present    Additional Factors Info  Code Status, Allergies, Psychotropic Code Status Info: Full Allergies Info: NKA Psychotropic Info: Depakote         Current Medications (07/12/2019):  This is the current hospital active medication list Current Facility-Administered Medications  Medication Dose Route Frequency Provider Last Rate Last Admin  . acetaminophen (TYLENOL) tablet 650 mg  650 mg Oral Q4H PRN Marton Redwood III, MD      . amLODipine (NORVASC) tablet 5 mg  5 mg Oral Daily Marton Redwood III, MD   5 mg at 07/11/19 1141  . Chlorhexidine Gluconate Cloth 2 % PADS 6 each  6 each Topical Daily Lucas Mallow, MD   6 each at 07/12/19  1049  . diphenhydrAMINE (BENADRYL) injection 12.5 mg  12.5 mg Intravenous Q6H PRN Marton Redwood III, MD       Or  . diphenhydrAMINE (BENADRYL) 12.5 MG/5ML elixir 12.5 mg  12.5 mg Oral Q6H PRN Marton Redwood III, MD      . divalproex (DEPAKOTE ER) 24 hr tablet 1,500 mg  1,500 mg Oral Daily Marton Redwood III, MD   1,500 mg at 07/12/19 1048  . donepezil (ARICEPT) tablet 5 mg  5 mg Oral QHS Marton Redwood III, MD   5 mg at 07/11/19 2204  .  DULoxetine (CYMBALTA) DR capsule 30 mg  30 mg Oral BID Marton Redwood III, MD   30 mg at 07/12/19 1048  . lisinopril (ZESTRIL) tablet 20 mg  20 mg Oral Daily Marton Redwood III, MD   20 mg at 07/12/19 1048  . MEDLINE mouth rinse  15 mL Mouth Rinse BID Marton Redwood III, MD   15 mL at 07/12/19 1049  . morphine 2 MG/ML injection 2-4 mg  2-4 mg Intravenous Q2H PRN Marton Redwood III, MD      . ondansetron (ZOFRAN) injection 4 mg  4 mg Intravenous Q4H PRN Marton Redwood III, MD      . opium-belladonna (B&O SUPPRETTES) 16.2-60 MG suppository 1 suppository  1 suppository Rectal Q6H PRN Marton Redwood III, MD      . oxybutynin (DITROPAN) tablet 5 mg  5 mg Oral Q8H PRN Marton Redwood III, MD      . oxyCODONE (Oxy IR/ROXICODONE) immediate release tablet 5 mg  5 mg Oral Q4H PRN Marton Redwood III, MD      . senna-docusate (Senokot-S) tablet 2 tablet  2 tablet Oral QHS Lucas Mallow, MD   2 tablet at 07/11/19 2203  . sodium chloride irrigation 0.9 % 3,000 mL  3,000 mL Irrigation Continuous Marton Redwood III, MD   3,000 mL at 07/10/19 1608  . zolpidem (AMBIEN) tablet 5 mg  5 mg Oral QHS PRN Lucas Mallow, MD         Discharge Medications: Please see discharge summary for a list of discharge medications.  Relevant Imaging Results:  Relevant Lab Results:   Additional Information SSN [id number]. PATIENT IS ACTIVE WITH [facility]  Servando Snare, LCSW

## 2019-07-12 NOTE — Progress Notes (Signed)
Patient ID: Tony [name], male   DOB: 1951-07-07, 68 y.o.   MRN: [id number]  Patient was officially discharged yesterday and was simply awaiting placement.  He was apparently prepared for discharge to [facility], which is where he was residing prior to his hospitalization for his surgery.  His discharge was apparently held up when his brother refused transportation to [facility] and requested a different facility.  Discharge is pending transition of care for this patient.  He remained stable with clear urine today.  He will be discharged once he has placement.

## 2019-07-13 ENCOUNTER — Other Ambulatory Visit (HOSPITAL_COMMUNITY): Payer: Medicare (Managed Care)

## 2019-07-13 DIAGNOSIS — C61 Malignant neoplasm of prostate: Secondary | ICD-10-CM | POA: Diagnosis not present

## 2019-07-13 LAB — SARS CORONAVIRUS 2 (TAT 6-24 HRS): SARS Coronavirus 2: NEGATIVE

## 2019-07-13 NOTE — Discharge Summary (Addendum)
Physician Discharge Summary  Patient ID: Tony [name] MRN: [id number] DOB/AGE: November 26, 1950 68 y.o.  Admit date: 07/10/2019 Discharge date: 07/14/2019  Admission Diagnoses:  Discharge Diagnoses:  Active Problems:   Urinary retention   Discharged Condition: good  Hospital Course: Patient underwent a TURP on 07/10/2019.  He tolerated the procedure well and was stable postoperatively.  He remained overnight on continuous bladder irrigation which was waned.  Plan was for discharge the following day but this has been held up by trying to find a rehab facility for the patient.  He was discharged once a rehab facility was made.  Consults: None  Significant Diagnostic Studies: none  Treatments: surgery: TURP  Discharge Exam: Blood pressure 104/68, pulse 85, temperature (!) 97.5 F (36.4 C), temperature source Oral, resp. rate 14, height 5\' 11"  (1.803 m), weight 86.3 kg, SpO2 100 %. General appearance: alert no acute distress Adequate perfusion of extremities Nonlabored respiration Abdomen soft, nontender, nondistended Foley catheter draining clear yellow urine  Disposition: Discharge disposition: 03-Skilled Nursing Facility        Allergies as of 07/13/2019   No Known Allergies     Medication List    TAKE these medications   amLODipine 5 MG tablet Commonly known as: NORVASC Take 1 tablet (5 mg total) by mouth daily.   Decubi-Vite Caps Take 1 capsule by mouth daily.   divalproex 500 MG 24 hr tablet Commonly known as: DEPAKOTE ER Take 2 tablets (1,000 mg total) by mouth daily. What changed: how much to take   donepezil 5 MG tablet Commonly known as: ARICEPT Take 1 tablet (5 mg total) by mouth at bedtime.   DULoxetine 30 MG capsule Commonly known as: CYMBALTA Take 30 mg by mouth 2 (two) times daily.   feeding supplement (PRO-STAT SUGAR FREE 64) Liqd Take 30 mLs by mouth 2 (two) times daily.   HYDROcodone-acetaminophen 5-325 MG tablet Commonly known as:  NORCO/VICODIN Take 1 tablet by mouth every 4 (four) hours as needed for moderate pain.   lisinopril 20 MG tablet Commonly known as: ZESTRIL Take 20 mg by mouth daily.   tamsulosin 0.4 MG Caps capsule Commonly known as: FLOMAX Take 1 capsule (0.4 mg total) by mouth daily after breakfast.   Vitamin D (Ergocalciferol) 1.25 MG (50000 UT) Caps capsule Commonly known as: DRISDOL Take 50,000 Units by mouth every Friday.     ASK your doctor about these medications   levofloxacin 500 MG tablet Commonly known as: LEVAQUIN Take 1 tablet (500 mg total) by mouth daily for 13 days. Ask about: Should I take this medication?      Follow-up Information    Lucas Mallow, MD Follow up.   Specialty: Urology Why: As scheduled Contact information: [place] [address] [phone]           Signed: Marton Redwood, III 07/13/2019, 8:57 AM

## 2019-07-13 NOTE — TOC Progression Note (Signed)
Transition of Care [facility]) - Progression Note    Patient Details  Name: Tony [name] MRN: [id number] Date of Birth: 12-08-1950  [facility]) CM/SW [place], LCSW Phone Number: 07/13/2019, 12:07 PM  Clinical Narrative:   Patient has a bed at [facility] and rehab. Patients brother is agreeable to discharge plan. Per facility they will need patient to be tested for covid prior to him discharging from the hospital. CSW made nurse aware of facilities request for covid test. Per rn she will page MD to request an order for test         Expected Discharge Plan and Services           Expected Discharge Date: 07/11/19                                     Social Determinants of Health (SDOH) Interventions    Readmission Risk Interventions No flowsheet data found.

## 2019-07-14 DIAGNOSIS — C61 Malignant neoplasm of prostate: Secondary | ICD-10-CM | POA: Diagnosis not present

## 2019-07-14 LAB — SURGICAL PATHOLOGY

## 2019-07-14 NOTE — TOC Transition Note (Signed)
Transition of Care [facility]) - CM/SW Discharge Note   Patient Details  Name: Tony [name] MRN: [id number] Date of Birth: [birth year]  Transition [facility]) CM/SW Contact:  Wende Neighbors, LCSW Phone Number: 07/14/2019, 11:34 AM   Clinical Narrative:   Patient to discharge to [facility] via [place]. Family agreeable to discharge plan. RN to call [phone] (rm# [phone]) for report    Final next level of care: Skilled Nursing Facility Barriers to Discharge: No Barriers Identified   Patient Goals and CMS Choice        Discharge Placement              Patient chooses bed at: [facility] Patient to be transferred to facility by: ptar Name of family member notified: spoke with brother Patient and family notified of of transfer: 07/14/19  Discharge Plan and Services                                     Social Determinants of Health (SDOH) Interventions     Readmission Risk Interventions No flowsheet data found.

## 2019-07-14 NOTE — Progress Notes (Signed)
[facility] called x2 to give report to RN with no answer.

## 2019-07-14 NOTE — Progress Notes (Signed)
Spoke with Dr. Gloriann Loan. Order for norco no longer needed since patient is not having pain.  Patient can discharge without hard copy prescription.

## 2019-07-14 NOTE — Discharge Summary (Signed)
Physician Discharge Summary  Patient ID: Tony [name] MRN: [id number] DOB/AGE: 1950/12/17 68 y.o.  Admit date: 07/10/2019 Discharge date: 07/14/2019  Admission Diagnoses:  Discharge Diagnoses:  Active Problems:   Urinary retention   Discharged Condition: good  Hospital Course: Patient underwent a TURP on 07/10/2019.  He tolerated the procedure well and was stable postoperatively.  He remained overnight on continuous bladder irrigation which was waned.  Plan was for discharge the following day but this has been held up by trying to find a rehab facility for the patient.  He was discharged once a rehab facility was made.  Consults: None  Significant Diagnostic Studies: none  Treatments: surgery: TURP  Discharge Exam: Blood pressure 107/66, pulse 92, temperature 98.1 F (36.7 C), temperature source Oral, resp. rate 20, height 5\' 11"  (1.803 m), weight 86.3 kg, SpO2 96 %. General appearance: alert no acute distress Adequate perfusion of extremities Nonlabored respiration Abdomen soft, nontender, nondistended Foley catheter draining clear yellow urine  Disposition: Discharge disposition: 03-Skilled Nursing Facility        Allergies as of 07/14/2019   No Known Allergies     Medication List    TAKE these medications   amLODipine 5 MG tablet Commonly known as: NORVASC Take 1 tablet (5 mg total) by mouth daily.   Decubi-Vite Caps Take 1 capsule by mouth daily.   divalproex 500 MG 24 hr tablet Commonly known as: DEPAKOTE ER Take 2 tablets (1,000 mg total) by mouth daily. What changed: how much to take   donepezil 5 MG tablet Commonly known as: ARICEPT Take 1 tablet (5 mg total) by mouth at bedtime.   DULoxetine 30 MG capsule Commonly known as: CYMBALTA Take 30 mg by mouth 2 (two) times daily.   feeding supplement (PRO-STAT SUGAR FREE 64) Liqd Take 30 mLs by mouth 2 (two) times daily.   HYDROcodone-acetaminophen 5-325 MG tablet Commonly known as:  NORCO/VICODIN Take 1 tablet by mouth every 4 (four) hours as needed for moderate pain.   lisinopril 20 MG tablet Commonly known as: ZESTRIL Take 20 mg by mouth daily.   tamsulosin 0.4 MG Caps capsule Commonly known as: FLOMAX Take 1 capsule (0.4 mg total) by mouth daily after breakfast.   Vitamin D (Ergocalciferol) 1.25 MG (50000 UT) Caps capsule Commonly known as: DRISDOL Take 50,000 Units by mouth every Friday.     ASK your doctor about these medications   levofloxacin 500 MG tablet Commonly known as: LEVAQUIN Take 1 tablet (500 mg total) by mouth daily for 13 days. Ask about: Should I take this medication?       Contact information for follow-up providers    Lucas Mallow, MD Follow up.   Specialty: Urology Why: As scheduled Contact information: [place] [phone]            Contact information for after-discharge care    Destination    [facility] Preferred SNF .   Service: Skilled Nursing Contact information: [address] [place] [phone]                  Signed: Marton Redwood, III 07/14/2019, 12:59 PM

## 2019-10-12 ENCOUNTER — Encounter (HOSPITAL_BASED_OUTPATIENT_CLINIC_OR_DEPARTMENT_OTHER): Payer: Medicare (Managed Care) | Attending: Internal Medicine | Admitting: Internal Medicine

## 2019-10-12 ENCOUNTER — Other Ambulatory Visit: Payer: Self-pay

## 2019-10-12 DIAGNOSIS — Z841 Family history of disorders of kidney and ureter: Secondary | ICD-10-CM | POA: Diagnosis not present

## 2019-10-12 DIAGNOSIS — N189 Chronic kidney disease, unspecified: Secondary | ICD-10-CM | POA: Insufficient documentation

## 2019-10-12 DIAGNOSIS — E1122 Type 2 diabetes mellitus with diabetic chronic kidney disease: Secondary | ICD-10-CM | POA: Insufficient documentation

## 2019-10-12 DIAGNOSIS — L89614 Pressure ulcer of right heel, stage 4: Secondary | ICD-10-CM | POA: Diagnosis not present

## 2019-10-12 DIAGNOSIS — I129 Hypertensive chronic kidney disease with stage 1 through stage 4 chronic kidney disease, or unspecified chronic kidney disease: Secondary | ICD-10-CM | POA: Diagnosis not present

## 2019-10-12 DIAGNOSIS — E11621 Type 2 diabetes mellitus with foot ulcer: Secondary | ICD-10-CM | POA: Diagnosis not present

## 2019-10-12 DIAGNOSIS — Z809 Family history of malignant neoplasm, unspecified: Secondary | ICD-10-CM | POA: Diagnosis not present

## 2019-10-12 DIAGNOSIS — I252 Old myocardial infarction: Secondary | ICD-10-CM | POA: Insufficient documentation

## 2019-10-12 DIAGNOSIS — F0390 Unspecified dementia without behavioral disturbance: Secondary | ICD-10-CM | POA: Insufficient documentation

## 2019-10-12 DIAGNOSIS — E1151 Type 2 diabetes mellitus with diabetic peripheral angiopathy without gangrene: Secondary | ICD-10-CM | POA: Insufficient documentation

## 2019-10-12 DIAGNOSIS — G40909 Epilepsy, unspecified, not intractable, without status epilepticus: Secondary | ICD-10-CM | POA: Insufficient documentation

## 2019-10-12 DIAGNOSIS — Z8249 Family history of ischemic heart disease and other diseases of the circulatory system: Secondary | ICD-10-CM | POA: Diagnosis not present

## 2019-10-12 DIAGNOSIS — Z833 Family history of diabetes mellitus: Secondary | ICD-10-CM | POA: Insufficient documentation

## 2019-10-12 NOTE — Progress Notes (Signed)
BIAGGIO, KOSTURA ([id number]) Visit Report for 10/12/2019 Abuse/Suicide Risk Screen Details Patient Name: Date of Service: Tony [name], Tony [name] 10/12/2019 1:15 PM Medical Record [id number] Patient Account Number: [id number] Date of Birth/Sex: Treating RN: 02-Apr-1951 (68 y.o. [name]) Carlene Coria Primary Care Anny Sayler: Other Clinician: Referring Coulter Oldaker: Treating Macon Lesesne/Extender:Robson, Esperanza Richters in Treatment: 0 Abuse/Suicide Risk Screen Items Answer ABUSE RISK SCREEN: Has anyone close to you tried to hurt or harm you recentlyo No Do you feel uncomfortable with anyone in your familyo No Has anyone forced you do things that you didnt want to doo No Electronic Signature(s) Signed: 10/12/2019 5:26:01 PM By: Carlene Coria RN Entered By: Carlene Coria on 10/12/2019 14:06:12 -------------------------------------------------------------------------------- Activities of Daily Living Details Patient Name: Date of Service: Tony [name], Tony [name] 10/12/2019 1:15 PM Medical Record [id number] Patient Account Number: [id number] Date of Birth/Sex: Treating RN: Oct 18, 1950 (68 y.o. [name]) Carlene Coria Primary Care Amadeo Coke: Other Clinician: Referring Janai Maudlin: Treating Breelyn Icard/Extender:Robson, Esperanza Richters in Treatment: 0 Activities of Daily Living Items Answer Activities of Daily Living (Please select one for each item) Drive Automobile Not Able Take Medications Not Able Use Telephone Not Able Care for Appearance Need Assistance Use Toilet Need Assistance Bath / Shower Need Assistance Dress Self Need Assistance Feed Self Completely Able Walk Need Assistance Get In / Out Bed Need Assistance Housework Not Able Prepare Meals Not Able Handle Money Not Able Shop for Self Not Able Electronic Signature(s) Signed: 10/12/2019 5:26:01 PM By: Carlene Coria RN Entered By: Carlene Coria on 10/12/2019 14:07:46 -------------------------------------------------------------------------------- Education  Screening Details Patient Name: Date of Service: Tony [name] 10/12/2019 1:15 PM Medical Record [id number] Patient Account Number: [id number] Date of Birth/Sex: Treating RN: 1951-02-26 (68 y.o. [name]) Carlene Coria Primary Care Ronasia Isola: Other Clinician: Referring Khayden Herzberg: Treating Joakim Huesman/Extender:Robson, Esperanza Richters in Treatment: 0 Primary Learner Assessed: Patient Learning Preferences/Education Level/Primary Language Learning Preference: Explanation Highest Education Level: College or Above Preferred Language: English Cognitive Barrier Language Barrier: No Translator Needed: No Memory Deficit: No Emotional Barrier: No Cultural/Religious Beliefs Affecting Medical Care: No Physical Barrier Impaired Vision: No Impaired Hearing: No Decreased Hand dexterity: No Knowledge/Comprehension Knowledge Level: Medium Comprehension Level: High Ability to understand written High instructions: Ability to understand verbal High instructions: Motivation Anxiety Level: Calm Cooperation: Cooperative Education Importance: Acknowledges Need Interest in Health Problems: Asks Questions Perception: Coherent Willingness to Engage in Self- High Management Activities: Readiness to Engage in Self- High Management Activities: Electronic Signature(s) Signed: 10/12/2019 5:26:01 PM By: Carlene Coria RN Entered By: Carlene Coria on 10/12/2019 14:08:15 -------------------------------------------------------------------------------- Fall Risk Assessment Details Patient Name: Date of Service: Tony [name] 10/12/2019 1:15 PM Medical Record [id number] Patient Account Number: [id number] Date of Birth/Sex: Treating RN: 03-14-1951 (68 y.o. [name]) Carlene Coria Primary Care Ahmya Bernick: Other Clinician: Referring Suzana Sohail: Treating Esteven Overfelt/Extender:Robson, Esperanza Richters in Treatment: 0 Fall Risk Assessment Items Have you had 2 or more falls in the last 12 monthso 0 No Have you had any fall  that resulted in injury in the last 12 monthso 0 No FALLS RISK SCREEN History of falling - immediate or within 3 months 0 No Secondary diagnosis (Do you have 2 or more medical diagnoseso) 0 No Ambulatory aid None/bed rest/wheelchair/nurse 0 No Crutches/cane/walker 0 No Furniture 0 No Intravenous therapy Access/Saline/Heparin Lock 0 No Weak (short steps with or without shuffle, stooped but able to lift head 0 No while walking, may seek support from furniture) Impaired (short steps with shuffle, may have difficulty arising from chair, 0 No head down, impaired balance) Mental Status Oriented to own ability  0 No Overestimates or forgets limitations 0 No Risk Level: Low Risk Score: 0 Electronic Signature(s) Signed: 10/12/2019 5:26:01 PM By: Carlene Coria RN Entered By: Carlene Coria on 10/12/2019 14:08:22 -------------------------------------------------------------------------------- Foot Assessment Details Patient Name: Date of Service: Tony [name] 10/12/2019 1:15 PM Medical Record [id number] Patient Account Number: [id number] Date of Birth/Sex: Treating RN: 18-Jun-1951 (68 y.o. [name]) Carlene Coria Primary Care Edrie Ehrich: Other Clinician: Referring Patches Mcdonnell: Treating Rosanna Bickle/Extender:Robson, Esperanza Richters in Treatment: 0 Foot Assessment Items Site Locations + = Sensation present, - = Sensation absent, C = Callus, U = Ulcer R = Redness, W = Warmth, M = Maceration, PU = Pre-ulcerative lesion F = Fissure, S = Swelling, D = Dryness Assessment Right: Left: Other Deformity: No No Prior Foot Ulcer: No No Prior Amputation: No No Charcot Joint: No No Ambulatory Status: Ambulatory Without Help Gait: Steady Electronic Signature(s) Signed: 10/12/2019 5:26:01 PM By: Carlene Coria RN Entered By: Carlene Coria on 10/12/2019 14:15:53 -------------------------------------------------------------------------------- Nutrition Risk Screening Details Patient Name: Date of Service: Tony [name], Tony [name] 10/12/2019 1:15 PM Medical Record [id number] Patient Account Number: [id number] Date of Birth/Sex: Treating RN: 04-Aug-1950 (68 y.o. [name]) Carlene Coria Primary Care Mercy Leppla: Other Clinician: Referring Rocky Gladden: Treating Louella Medaglia/Extender:Robson, Esperanza Richters in Treatment: 0 Height (in): 71 Weight (lbs): 220 Body Mass Index (BMI): 30.7 Nutrition Risk Screening Items Score Screening NUTRITION RISK SCREEN: I have an illness or condition that made me change the kind and/or 0 No amount of food I eat I eat fewer than two meals per day 0 No I eat few fruits and vegetables, or milk products 0 No I have three or more drinks of beer, liquor or wine almost every day 0 No I have tooth or mouth problems that make it hard for me to eat 0 No I don't always have enough money to buy the food I need 0 No I eat alone most of the time 0 No I take three or more different prescribed or over-the-counter drugs a day 1 Yes 0 No Without wanting to, I have lost or gained 10 pounds in the last six months I am not always physically able to shop, cook and/or feed myself 2 Yes Nutrition Protocols Good Risk Protocol Provide education on Moderate Risk Protocol 0 nutrition High Risk Proctocol Risk Level: Moderate Risk Score: 3 Electronic Signature(s) Signed: 10/12/2019 5:26:01 PM By: Carlene Coria RN Entered By: Carlene Coria on 10/12/2019 14:08:40

## 2019-10-13 NOTE — Progress Notes (Signed)
Tony, [name] ([id number]) Visit Report for 10/12/2019 Chief Complaint Document Details Patient Name: Date of Service: Tony [name], Tony [name] 10/12/2019 1:15 PM Medical Record [id number] Patient Account Number: [id number] Date of Birth/Sex: Treating RN: 1951/06/12 (69 y.o. Male) Levan Hurst Primary Care Provider: Other Clinician: Referring Provider: Treating Provider/Extender:Jared Cahn, Esperanza Richters in Treatment: 0 Information Obtained from: Patient Chief Complaint 10/12/2019; patient is here for review of the wound on his right Achilles heel area. Electronic Signature(s) Signed: 10/13/2019 10:32:20 AM By: Linton Ham MD Entered By: Linton Ham on 10/12/2019 15:00:35 -------------------------------------------------------------------------------- HPI Details Patient Name: Date of Service: Tony [name] 10/12/2019 1:15 PM Medical Record [id number] Patient Account Number: [id number] Date of Birth/Sex: Treating RN: 22-Dec-1950 (69 y.o. Male) Levan Hurst Primary Care Provider: Other Clinician: Referring Provider: Treating Provider/Extender:Treasure Ingrum, Esperanza Richters in Treatment: 0 History of Present Illness HPI Description: ADMISSION 10/12/2019 This is a 69 year old man who has some degree of dementia. Per discussion with his brother apparently he was functional living somewhere outside of [place] until November. He became acutely ill and required admission to hospital from 11/13 through 06/27/2019. He was treated for prostatitis with urosepsis and urinary retention. He required readmission to hospital from 12/11 through 12/15 for a TURP. At some point during this time he was discovered to have a ulcer on his heel although I do not see anything about it in [facility] link. His brother is not aware exactly when this happened. However he is here for our review of a small but deep wound on the Achilles aspect of his right heel. We are not clear what [facility] is currently  dressing this with as we did not get any notes. They apparently did not know that he had an appointment with [place] today. Past medical history includes seizure disorder, hypertension, dementia, hyperprolactinemia, chronic renal failure stage IIIb. Apparently a recent diagnosis of type 2 diabetes although is not on any treatment. ABIs in our clinic were noncompressible on the right Electronic Signature(s) Signed: 10/13/2019 10:32:20 AM By: Linton Ham MD Entered By: Linton Ham on 10/12/2019 15:05:13 -------------------------------------------------------------------------------- Physical Exam Details Patient Name: Date of Service: Tony, Hanish R. 10/12/2019 1:15 PM Medical Record [id number] Patient Account Number: [id number] Date of Birth/Sex: Treating RN: 08/22/1950 (69 y.o. Male) Levan Hurst Primary Care Provider: Other Clinician: Referring Provider: Treating Provider/Extender:Sharina Petre, Esperanza Richters in Treatment: 0 Constitutional Sitting or standing Blood Pressure is within target range for patient.. Pulse regular and within target range for patient.Marland Kitchen Respirations regular, non-labored and within target range.. Temperature is normal and within the target range for the patient.Marland Kitchen Appears in no distress. Cardiovascular Femoral and popliteal pulses easily palpable on the right. Pedal pulses are easily palpable. Integumentary (Hair, Skin) Surrounding erythema is noted around the wound and tenderness.Marland Kitchen Psychiatric appears at normal baseline. Notes Wound exam; this is a small wound on the Achilles at tendon insertion site of his right heel. Relative to its size there is a fair amount of depth here which goes precariously close to underlying bone. No debridement was done. No cultures were done. Notable for surrounding erythema and marked tenderness Electronic Signature(s) Signed: 10/13/2019 10:32:20 AM By: Linton Ham MD Entered By: Linton Ham on 10/12/2019  15:06:37 -------------------------------------------------------------------------------- Physician Orders Details Patient Name: Date of Service: Tony [name] 10/12/2019 1:15 PM Medical Record [id number] Patient Account Number: [id number] Date of Birth/Sex: Treating RN: Nov 25, 1950 (69 y.o. Male) Levan Hurst Primary Care Provider: Other Clinician: Referring Provider: Treating Provider/Extender:Oliana Gowens, Esperanza Richters in Treatment: 0 Verbal / Phone Orders: No Diagnosis Coding  Follow-up Appointments Return Appointment in 1 week. Dressing Change Frequency Wound #1 Right Calcaneus Change Dressing every other day. Wound Cleansing Wound #1 Right Calcaneus Clean wound with Normal Saline. - or wound cleanser Primary Wound Dressing Wound #1 Right Calcaneus Silver Collagen - moisten with saline Secondary Dressing Wound #1 Right Calcaneus Kerlix/Rolled Gauze Dry Gauze Heel Cup Off-Loading Turn and reposition every 2 hours Other: - float heels off of chair/bed with pillow under calves [facility] for wound care. - [facility] X-ray, right heel - Non healing ulcer on right heel Patient Medications Allergies: No Known Allergies Notifications Medication Indication Start End doxycycline hyclate 10/12/2019 DOSE 1 - oral 100 mg capsule - 1 capsule oral 2 times a day for 7 days Electronic Signature(s) Signed: 10/12/2019 5:44:18 PM By: Levan Hurst RN, BSN Signed: 10/13/2019 10:32:20 AM By: Linton Ham MD Entered By: Levan Hurst on 10/12/2019 15:11:24 -------------------------------------------------------------------------------- Prescription 10/12/2019 Patient Name: Tony [name] Provider: Linton Ham MD Date of Birth: 1951-01-19 NPI#: [id number] Sex: Male DEA#: [id number] Phone #: [phone] License #: [id number] Patient Address: [address] [place], [place] [address] [place], [place] [address] [phone] Allergies No Known Allergies Provider's Orders X-ray, right heel - Non healing ulcer on right heel Signature(s): Date(s): Prescription 10/12/2019 Patient Name: Tony [name] Provider: Linton Ham MD Date of Birth: [birth year] NPI#: [id number] Sex: Male DEA#: [id number] Phone #: [phone] License #: [id number] Patient Address: [address] [place], [place] [address] [place], [place] [address] [phone] Allergies No Known Allergies Medication Medication: Route: Strength: Form: doxycycline hyclate oral 100 mg capsule Class: PERIODONTAL COLLAGENASE INHIBITORS Dose: Frequency / Time: Indication: 1 1 capsule oral 2 times a day for 7 days Number of Refills: Number of Units: 0 Generic Substitution: Start Date: End Date: Administered at Substitution Permitted [id number] Facility: No Note to Pharmacy: Signature(s): Date(s): Electronic Signature(s) Signed: 10/12/2019 5:44:18 PM By: Levan Hurst RN, BSN Signed: 10/13/2019 10:32:20 AM By: Linton Ham MD Entered By: Levan Hurst on 10/12/2019 15:11:25 --------------------------------------------------------------------------------  Problem List Details Patient Name: Date of Service: Tony [name] 10/12/2019 1:15 PM Medical Record [id number] Patient Account Number: [id number] Date of Birth/Sex: Treating RN: Dec 05, 1950 (68 y.o. Male) Levan Hurst Primary Care Provider: Other Clinician: Referring Provider: Treating Provider/Extender:Haig Gerardo, Esperanza Richters in Treatment: 0 Active Problems ICD-10 Evaluated Encounter Code Description Active Date Today Diagnosis L89.614 Pressure ulcer of right heel, stage 4 10/12/2019 No Yes E11.621 Type 2 diabetes mellitus with foot ulcer 10/12/2019 No Yes Inactive Problems Resolved Problems Electronic  Signature(s) Signed: 10/13/2019 10:32:20 AM By: Linton Ham MD Entered By: Linton Ham on 10/12/2019 14:59:10 -------------------------------------------------------------------------------- Progress Note Details Patient Name: Date of Service: Tony [name] 10/12/2019 1:15 PM Medical Record [id number] Patient Account Number: [id number] Date of Birth/Sex: Treating RN: 1951-03-27 (68 y.o. Male) Levan Hurst Primary Care Provider: Other Clinician: Referring Provider: Treating Provider/Extender:Tywanda Rice, Esperanza Richters in Treatment: 0 Subjective Chief Complaint Information obtained from Patient 10/12/2019; patient is here for review of the wound on his right Achilles heel area. History of Present Illness (HPI) ADMISSION 10/12/2019 This is a 68 year old man who has some degree of dementia. Per discussion with his brother apparently he was functional living somewhere outside of [place] until November. He became acutely ill and required admission to hospital from 11/13 through 06/27/2019. He was treated for prostatitis with urosepsis  and urinary retention. He required readmission to hospital from 12/11 through 12/15 for a TURP. At some point during this time he was discovered to have a ulcer on his heel although I do not see anything about it in [facility] link. His brother is not aware exactly when this happened. However he is here for our review of a small but deep wound on the Achilles aspect of his right heel. We are not clear what [facility] is currently dressing this with as we did not get any notes. They apparently did not know that he had an appointment with [place] today. Past medical history includes seizure disorder, hypertension, dementia, hyperprolactinemia, chronic renal failure stage IIIb. Apparently a recent diagnosis of type 2 diabetes although is not on any treatment. ABIs in our clinic were noncompressible on the right Patient History Information obtained from  Patient. Allergies No Known Allergies Family History Cancer - Siblings, Diabetes - Mother, Heart Disease - Siblings, Hypertension - Mother,Siblings, Kidney Disease - Mother, No family history of Hereditary Spherocytosis, Lung Disease, Seizures, Stroke, Thyroid Problems, Tuberculosis. Social History Never smoker, Marital Status - Widowed, Alcohol Use - Never, Drug Use - No History, Caffeine Use - Daily. Medical History Eyes Denies history of Cataracts, Glaucoma, Optic Neuritis Ear/Nose/Mouth/Throat Denies history of Chronic sinus problems/congestion, Middle ear problems Hematologic/Lymphatic Denies history of Anemia, Hemophilia, Human Immunodeficiency Virus, Lymphedema, Sickle Cell Disease Respiratory Denies history of Aspiration, Asthma, Chronic Obstructive Pulmonary Disease (COPD), Pneumothorax, Sleep Apnea, Tuberculosis Cardiovascular Patient has history of Hypertension Denies history of Angina, Arrhythmia, Congestive Heart Failure, Coronary Artery Disease, Deep Vein Thrombosis, Hypotension, Myocardial Infarction, Peripheral Arterial Disease, Peripheral Venous Disease, Phlebitis, Vasculitis Gastrointestinal Denies history of Cirrhosis , Colitis, Crohnoos, Hepatitis A, Hepatitis B, Hepatitis C Endocrine Patient has history of Type II Diabetes Denies history of Type I Diabetes Genitourinary Denies history of End Stage Renal Disease Immunological Denies history of Lupus Erythematosus, Raynaudoos, Scleroderma Integumentary (Skin) Denies history of History of Burn Musculoskeletal Denies history of Gout, Rheumatoid Arthritis, Osteoarthritis, Osteomyelitis Neurologic Patient has history of Dementia, Seizure Disorder Denies history of Neuropathy, Quadriplegia, Paraplegia Oncologic Denies history of Received Chemotherapy, Received Radiation Psychiatric Denies history of Anorexia/bulimia, Confinement Anxiety Patient is treated with Controlled Diet. Review of Systems  (ROS) Constitutional Symptoms (General Health) Denies complaints or symptoms of Fatigue, Fever, Chills, Marked Weight Change. Eyes Denies complaints or symptoms of Dry Eyes, Vision Changes, Glasses / Contacts. Ear/Nose/Mouth/Throat Denies complaints or symptoms of Chronic sinus problems or rhinitis. Respiratory Denies complaints or symptoms of Chronic or frequent coughs, Shortness of Breath. Cardiovascular Denies complaints or symptoms of Chest pain. Gastrointestinal Denies complaints or symptoms of Frequent diarrhea, Nausea, Vomiting. Endocrine Denies complaints or symptoms of Heat/cold intolerance. Genitourinary Denies complaints or symptoms of Frequent urination. Integumentary (Skin) Denies complaints or symptoms of Wounds. Musculoskeletal Denies complaints or symptoms of Muscle Pain, Muscle Weakness. Neurologic Denies complaints or symptoms of Numbness/parasthesias. Psychiatric Denies complaints or symptoms of Claustrophobia, Suicidal. Objective Constitutional Sitting or standing Blood Pressure is within target range for patient.. Pulse regular and within target range for patient.Marland Kitchen Respirations regular, non-labored and within target range.. Temperature is normal and within the target range for the patient.Marland Kitchen Appears in no distress. Vitals Time Taken: 1:51 PM, Height: 71 in, Source: Stated, Weight: 220 lbs, Source: Stated, BMI: 30.7, Temperature: 98.6 F, Pulse: 78 bpm, Respiratory Rate: 18 breaths/min, Blood Pressure: 131/74 mmHg. Cardiovascular Femoral and popliteal pulses easily palpable on the right. Pedal pulses are easily palpable. Psychiatric appears at normal baseline. General  Notes: Wound exam; this is a small wound on the Achilles at tendon insertion site of his right heel. Relative to its size there is a fair amount of depth here which goes precariously close to underlying bone. No debridement was done. No cultures were done. Notable for surrounding erythema and  marked tenderness Integumentary (Hair, Skin) Surrounding erythema is noted around the wound and tenderness.. Wound #1 status is Open. Original cause of wound was Pressure Injury. The wound is located on the Right Calcaneus. The wound measures 0.8cm length x 2cm width x 1.6cm depth; 1.257cm^2 area and 2.011cm^3 volume. There is Fat Layer (Subcutaneous Tissue) Exposed exposed. There is no tunneling or undermining noted. There is a medium amount of serosanguineous drainage noted. The wound margin is well defined and not attached to the wound base. There is medium (34-66%) pink granulation within the wound bed. There is a medium (34-66%) amount of necrotic tissue within the wound bed including Adherent Slough. Assessment Active Problems ICD-10 Pressure ulcer of right heel, stage 4 Type 2 diabetes mellitus with foot ulcer Plan Follow-up Appointments: Return Appointment in 1 week. Dressing Change Frequency: Wound #1 Right Calcaneus: Change Dressing every other day. Wound Cleansing: Wound #1 Right Calcaneus: Clean wound with Normal Saline. - or wound cleanser Primary Wound Dressing: Wound #1 Right Calcaneus: Silver Collagen - moisten with saline Secondary Dressing: Wound #1 Right Calcaneus: Kerlix/Rolled Gauze Dry Gauze Heel Cup Off-Loading: Turn and reposition every 2 hours Other: - float heels off of chair/bed with pillow under calves Home Health: [facility] skilled nursing for wound care. - [facility] Rad[facility] ordered were: X-ray, right heel - Non healing ulcer on right heel The following medication(s) was prescribed: doxycycline hyclate oral 100 mg capsule 1 1 capsule oral 2 times a day for 7 days starting 10/12/2019 1. Small but deep wound on the right heel as described. 2. I gave him empiric doxycycline for what I feel is likely a wound infection around the wound with erythema and significant tenderness 3. The wound is precariously close to the underlying bone  itself. 4. I did not feel there was a vascular issue here. The wound bled fairly profusely his pedal pulses were robust. 5. Apparently a recent diagnosis of diabetes although he is not currently on any treatment. I did not research this today. 6. Dressing will be moistened silver collagen, heel cup, kerlix and roll gauze. Obviously offloading this will be of importance. Especially while he is in bed or in footwear. A surgical sandal might be beneficial as well. 7. X-ray of the right heel will be ordered through [facility] Electronic Signature(s) Signed: 10/12/2019 5:44:18 PM By: Levan Hurst RN, BSN Signed: 10/13/2019 10:32:20 AM By: Linton Ham MD Entered By: Levan Hurst on 10/12/2019 15:12:02 -------------------------------------------------------------------------------- HxROS Details Patient Name: Date of Service: Tony [name] 10/12/2019 1:15 PM Medical Record [id number] Patient Account Number: [id number] Date of Birth/Sex: Treating RN: February 13, 1951 (68 y.o. Male) Carlene Coria Primary Care Provider: Other Clinician: Referring Provider: Treating Provider/Extender:Everleigh Colclasure, Esperanza Richters in Treatment: 0 Information Obtained From Patient Constitutional Symptoms ([place]) Complaints and Symptoms: Negative for: Fatigue; Fever; Chills; Marked Weight Change Eyes Complaints and Symptoms: Negative for: Dry Eyes; Vision Changes; Glasses / Contacts Medical History: Negative for: Cataracts; Glaucoma; Optic Neuritis Ear/Nose/Mouth/Throat Complaints and Symptoms: Negative for: Chronic sinus problems or rhinitis Medical History: Negative for: Chronic sinus problems/congestion; Middle ear problems Respiratory Complaints and Symptoms: Negative for: Chronic or frequent coughs; Shortness of Breath Medical History: Negative for: Aspiration; Asthma; Chronic Obstructive  Pulmonary Disease (COPD); Pneumothorax; Sleep Apnea; Tuberculosis Cardiovascular Complaints and  Symptoms: Negative for: Chest pain Medical History: Positive for: Hypertension Negative for: Angina; Arrhythmia; Congestive Heart Failure; Coronary Artery Disease; Deep Vein Thrombosis; Hypotension; Myocardial Infarction; Peripheral Arterial Disease; Peripheral Venous Disease; Phlebitis; Vasculitis Gastrointestinal Complaints and Symptoms: Negative for: Frequent diarrhea; Nausea; Vomiting Medical History: Negative for: Cirrhosis ; Colitis; Crohns; Hepatitis A; Hepatitis B; Hepatitis C Endocrine Complaints and Symptoms: Negative for: Heat/cold intolerance Medical History: Positive for: Type II Diabetes Negative for: Type I Diabetes Treated with: Diet Genitourinary Complaints and Symptoms: Negative for: Frequent urination Medical History: Negative for: End Stage Renal Disease Integumentary (Skin) Complaints and Symptoms: Negative for: Wounds Medical History: Negative for: History of Burn Musculoskeletal Complaints and Symptoms: Negative for: Muscle Pain; Muscle Weakness Medical History: Negative for: Gout; Rheumatoid Arthritis; Osteoarthritis; Osteomyelitis Neurologic Complaints and Symptoms: Negative for: Numbness/parasthesias Medical History: Positive for: Dementia; Seizure Disorder Negative for: Neuropathy; Quadriplegia; Paraplegia Psychiatric Complaints and Symptoms: Negative for: Claustrophobia; Suicidal Medical History: Negative for: Anorexia/bulimia; Confinement Anxiety Hematologic/Lymphatic Medical History: Negative for: Anemia; Hemophilia; Human Immunodeficiency Virus; Lymphedema; Sickle Cell Disease Immunological Medical History: Negative for: Lupus Erythematosus; Raynauds; Scleroderma Oncologic Medical History: Negative for: Received Chemotherapy; Received Radiation Immunizations Pneumococcal Vaccine: Received Pneumococcal Vaccination: No Implantable Devices None Family and Social History Cancer: Yes - Siblings; Diabetes: Yes - Mother; Heart  Disease: Yes - Siblings; Hereditary Spherocytosis: No; Hypertension: Yes - Mother,Siblings; Kidney Disease: Yes - Mother; Lung Disease: No; Seizures: No; Stroke: No; Thyroid Problems: No; Tuberculosis: No; Never smoker; Marital Status - Widowed; Alcohol Use: Never; Drug Use: No History; Caffeine Use: Daily; Financial Concerns: No; Food, Clothing or Shelter Needs: No; Support System Lacking: No; Transportation Concerns: No Electronic Signature(s) Signed: 10/12/2019 5:26:01 PM By: Carlene Coria RN Signed: 10/13/2019 10:32:20 AM By: Linton Ham MD Entered By: Carlene Coria on 10/12/2019 14:06:01 -------------------------------------------------------------------------------- SuperBill Details Patient Name: Date of Service: Tony [name] 10/12/2019 Medical Record [id number] Patient Account Number: [id number] Date of Birth/Sex: Treating RN: March 31, 1951 (68 y.o. Male) Levan Hurst Primary Care Provider: Other Clinician: Referring Provider: Treating Provider/Extender:Stefanny Pieri, Esperanza Richters in Treatment: 0 Diagnosis Coding ICD-10 Codes Code Description L89.614 Pressure ulcer of right heel, stage 4 E11.621 Type 2 diabetes mellitus with foot ulcer Facility Procedures CPT4 Code: [id number] Description: 99213 - WOUND CARE VISIT-LEV 3 EST PT Modifier: Quantity: 1 Physician Procedures CPT4 Code: [id number] Description: WC PHYS LEVEL 3 NEW PT ICD-10 Diagnosis Description L89.614 Pressure ulcer of right heel, stage 4 E11.621 Type 2 diabetes mellitus with foot ulcer Modifier: Quantity: 1 Electronic Signature(s) Signed: 10/12/2019 5:44:18 PM By: Levan Hurst RN, BSN Signed: 10/13/2019 10:32:20 AM By: Linton Ham MD Entered By: Levan Hurst on 10/12/2019 15:44:08

## 2019-10-20 ENCOUNTER — Encounter (HOSPITAL_BASED_OUTPATIENT_CLINIC_OR_DEPARTMENT_OTHER): Payer: Medicare (Managed Care) | Admitting: Internal Medicine

## 2019-10-20 ENCOUNTER — Other Ambulatory Visit: Payer: Self-pay

## 2019-10-20 DIAGNOSIS — E11621 Type 2 diabetes mellitus with foot ulcer: Secondary | ICD-10-CM | POA: Diagnosis not present

## 2019-10-20 NOTE — Progress Notes (Signed)
Tony [name], Tony [name] ([id number]) Visit Report for 10/20/2019 Debridement Details Patient Name: Date of Service: Tony [name] 10/20/2019 11:00 AM Medical Record [id number] Patient Account Number: [id number] Date of Birth/Sex: 1951/07/17 (68 y.o. M) Treating RN: Carlene Coria Primary Care Provider: Other Clinician: Referring Provider: Treating Provider/Extender:Yaslin Kirtley, Esperanza Richters in Treatment: 1 Debridement Performed for Wound #1 Right Calcaneus Assessment: Performed By: Physician Ricard Dillon., MD Debridement Type: Debridement Level of Consciousness (Pre- Awake and Alert procedure): Pre-procedure Yes - 12:20 Verification/Time Out Taken: Start Time: 12:20 Pain Control: Lidocaine 5% topical ointment Total Area Debrided (L x W): 0.4 (cm) x 1.6 (cm) = 0.64 (cm) Tissue and other material Viable, Non-Viable, Slough, Subcutaneous, Skin: Dermis , Skin: Epidermis, Slough debrided: Level: Skin/Subcutaneous Tissue Debridement Description: Excisional Instrument: Curette Bleeding: Minimum Hemostasis Achieved: Pressure End Time: 12:25 Procedural Pain: 0 Post Procedural Pain: 0 Response to Treatment: Procedure was tolerated well Level of Consciousness Awake and Alert (Post-procedure): Post Debridement Measurements of Total Wound Length: (cm) 0.4 Stage: Category/Stage IV Width: (cm) 1.6 Depth: (cm) 1 Volume: (cm) 0.503 Character of Wound/Ulcer Post Improved Debridement: Post Procedure Diagnosis Same as Pre-procedure Electronic Signature(s) Signed: 10/20/2019 5:53:09 PM By: Linton Ham MD Signed: 10/20/2019 5:59:23 PM By: Carlene Coria RN Entered By: Carlene Coria on 10/20/2019 12:37:47 -------------------------------------------------------------------------------- HPI Details Patient Name: Date of Service: Tony [name] 10/20/2019 11:00 AM Medical Record [id number] Patient Account Number: [id number] Date of Birth/Sex: Treating RN: October 24, 1950 (68 y.o. [name]) Carlene Coria Primary Care Provider: Other Clinician: Referring Provider: Treating Provider/Extender:Kiyanna Biegler, Esperanza Richters in Treatment: 1 History of Present Illness HPI Description: ADMISSION 10/12/2019 This is a 69 year old man who has some degree of dementia. Per discussion with his brother apparently he was functional living somewhere outside of [place] until November. He became acutely ill and required admission to hospital from 11/13 through 06/27/2019. He was treated for prostatitis with urosepsis and urinary retention. He required readmission to hospital from 12/11 through 12/15 for a TURP. At some point during this time he was discovered to have a ulcer on his heel although I do not see anything about it in [place] link. His brother is not aware exactly when this happened. However he is here for our review of a small but deep wound on the Achilles aspect of his right heel. We are not clear what [facility] is currently dressing this with as we did not get any notes. They apparently did not know that he had an appointment with [place] today. Past medical history includes seizure disorder, hypertension, dementia, hyperprolactinemia, chronic renal failure stage IIIb. Apparently a recent diagnosis of type 2 diabetes although is not on any treatment. ABIs in our clinic were noncompressible on the right 3/23; deep pressure injury on the Achilles aspect of the right heel. X-ray we did of the area was negative. We have been using silver collagen Electronic Signature(s) Signed: 10/20/2019 5:53:09 PM By: Linton Ham MD Entered By: Linton Ham on 10/20/2019 12:49:53 -------------------------------------------------------------------------------- Physical Exam Details Patient Name: Date of Service: Tony [name] 10/20/2019 11:00 AM Medical Record [id number] Patient Account Number: [id number] Date of Birth/Sex: Treating RN: Aug 19, 1950 (68 y.o. [name]) Carlene Coria Primary Care  Provider: Other Clinician: Referring Provider: Treating Provider/Extender:Golda Zavalza, Esperanza Richters in Treatment: 1 Constitutional Sitting or standing Blood Pressure is within target range for patient.. Pulse regular and within target range for patient.Marland Kitchen Respirations regular, non-labored and within target range.. Temperature is normal and within the target range for the patient.Marland Kitchen Appears in no distress. Notes Wound exam this  is a small wound on the Achilles tendon insertion site on the right heel. It however it does however have a considerable depth roughly 1 cm. Debrided with a #3 curette of a necrotic surface. There is no palpable bone but this wound is deep. No surrounding infection Electronic Signature(s) Signed: 10/20/2019 5:53:09 PM By: Linton Ham MD Entered By: Linton Ham on 10/20/2019 12:50:45 -------------------------------------------------------------------------------- Physician Orders Details Patient Name: Date of Service: Tony [name] 10/20/2019 11:00 AM Medical Record [id number] Patient Account Number: [id number] Date of Birth/Sex: Treating RN: [birth year] (68 y.o. [name]) Carlene Coria Primary Care Provider: Other Clinician: Referring Provider: Treating Provider/Extender:Tanji Storrs, Esperanza Richters in Treatment: 1 Verbal / Phone Orders: No Diagnosis Coding ICD-10 Coding Code Description L89.614 Pressure ulcer of right heel, stage 4 E11.621 Type 2 diabetes mellitus with foot ulcer Follow-up Appointments Return Appointment in 2 weeks. Dressing Change Frequency Wound #1 Right Calcaneus Change Dressing every other day. Wound Cleansing Wound #1 Right Calcaneus Clean wound with Normal Saline. - or wound cleanser Primary Wound Dressing Wound #1 Right Calcaneus Silver Collagen - moisten with saline Secondary Dressing Wound #1 Right Calcaneus Kerlix/Rolled Gauze Dry Gauze Heel Cup Off-Loading Turn and reposition every 2 hours Other: - float heels off of  chair/bed with pillow under calves [facility] for wound care. - [facility] Electronic Signature(s) Signed: 10/20/2019 5:53:09 PM By: Linton Ham MD Signed: 10/20/2019 5:59:23 PM By: Carlene Coria RN Entered By: Carlene Coria on 10/20/2019 12:22:47 -------------------------------------------------------------------------------- Problem List Details Patient Name: Date of Service: Tony [name] 10/20/2019 11:00 AM Medical Record [id number] Patient Account Number: [id number] Date of Birth/Sex: Treating RN: 1951-04-15 (68 y.o. [name]) Carlene Coria Primary Care Provider: Other Clinician: Referring Provider: Treating Provider/Extender:Hira Trent, Esperanza Richters in Treatment: 1 Active Problems ICD-10 Evaluated Encounter Code Description Active Date Today Diagnosis L89.614 Pressure ulcer of right heel, stage 4 10/12/2019 No Yes E11.621 Type 2 diabetes mellitus with foot ulcer 10/12/2019 No Yes Inactive Problems Resolved Problems Electronic Signature(s) Signed: 10/20/2019 5:53:09 PM By: Linton Ham MD Entered By: Linton Ham on 10/20/2019 12:47:39 -------------------------------------------------------------------------------- Progress Note Details Patient Name: Date of Service: Tony [name] 10/20/2019 11:00 AM Medical Record [id number] Patient Account Number: [id number] Date of Birth/Sex: Treating RN: May 12, 1951 (68 y.o. [name]) Carlene Coria Primary Care Provider: Other Clinician: Referring Provider: Treating Provider/Extender:Keyleen Cerrato, Esperanza Richters in Treatment: 1 Subjective History of Present Illness (HPI) ADMISSION 10/12/2019 This is a 69 year old man who has some degree of dementia. Per discussion with his brother apparently he was functional living somewhere outside of [place] until November. He became acutely ill and required admission to hospital from 11/13 through 06/27/2019. He was treated for prostatitis with urosepsis and urinary  retention. He required readmission to hospital from 12/11 through 12/15 for a TURP. At some point during this time he was discovered to have a ulcer on his heel although I do not see anything about it in [place] link. His brother is not aware exactly when this happened. However he is here for our review of a small but deep wound on the Achilles aspect of his right heel. We are not clear what [facility] is currently dressing this with as we did not get any notes. They apparently did not know that he had an appointment with [place] today. Past medical history includes seizure disorder, hypertension, dementia, hyperprolactinemia, chronic renal failure stage IIIb. Apparently a recent diagnosis of type 2 diabetes although is not on any treatment. ABIs in our clinic were noncompressible on the right  3/23; deep pressure injury on the Achilles aspect of the right heel. X-ray we did of the area was negative. We have been using silver collagen Objective Constitutional Sitting or standing Blood Pressure is within target range for patient.. Pulse regular and within target range for patient.Marland Kitchen Respirations regular, non-labored and within target range.. Temperature is normal and within the target range for the patient.Marland Kitchen Appears in no distress. Vitals Time Taken: 11:29 AM, Height: 71 in, Weight: 220 lbs, BMI: 30.7, Temperature: 97.5 F, Pulse: 70 bpm, Respiratory Rate: 18 breaths/min, Blood Pressure: 117/65 mmHg. General Notes: Wound exam this is a small wound on the Achilles tendon insertion site on the right heel. It however it does however have a considerable depth roughly 1 cm. Debrided with a #3 curette of a necrotic surface. There is no palpable bone but this wound is deep. No surrounding infection Integumentary (Hair, Skin) Wound #1 status is Open. Original cause of wound was Pressure Injury. The wound is located on the Right Calcaneus. The wound measures 0.4cm length x 1.6cm width x 0.7cm depth;  0.503cm^2 area and 0.352cm^3 volume. There is Fat Layer (Subcutaneous Tissue) Exposed exposed. There is no tunneling or undermining noted. There is a medium amount of serosanguineous drainage noted. The wound margin is thickened. There is medium (34-66%) pink, pale granulation within the wound bed. There is a medium (34-66%) amount of necrotic tissue within the wound bed including Adherent Slough. Assessment Active Problems ICD-10 Pressure ulcer of right heel, stage 4 Type 2 diabetes mellitus with foot ulcer Procedures Wound #1 Pre-procedure diagnosis of Wound #1 is a Pressure Ulcer located on the Right Calcaneus . There was a Excisional Skin/Subcutaneous Tissue Debridement with a total area of 0.64 sq cm performed by Ricard Dillon., MD. With the following instrument(s): Curette to remove Viable and Non-Viable tissue/material. Material removed includes Subcutaneous Tissue, Slough, Skin: Dermis, and Skin: Epidermis after achieving pain control using Lidocaine 5% topical ointment. No specimens were taken. A time out was conducted at 12:20, prior to the start of the procedure. A Minimum amount of bleeding was controlled with Pressure. The procedure was tolerated well with a pain level of 0 throughout and a pain level of 0 following the procedure. Post Debridement Measurements: 0.4cm length x 1.6cm width x 1cm depth; 0.503cm^3 volume. Post debridement Stage noted as Category/Stage IV. Character of Wound/Ulcer Post Debridement is improved. Post procedure Diagnosis Wound #1: Same as Pre-Procedure Plan Follow-up Appointments: Return Appointment in 2 weeks. Dressing Change Frequency: Wound #1 Right Calcaneus: Change Dressing every other day. Wound Cleansing: Wound #1 Right Calcaneus: Clean wound with Normal Saline. - or wound cleanser Primary Wound Dressing: Wound #1 Right Calcaneus: Silver Collagen - moisten with saline Secondary Dressing: Wound #1 Right Calcaneus: Kerlix/Rolled  Gauze Dry Gauze Heel Cup Off-Loading: Turn and reposition every 2 hours Other: - float heels off of chair/bed with pillow under calves Home Health: [facility] for wound care. - [facility] 1. I am continuing with silver collagen to this area. 2. X-ray did not show infection and there is no obvious infection around the wound no purulent drainage. 3. The patient is [facility] change this area every second day Engineer, maintenance) Signed: 10/20/2019 5:53:09 PM By: Linton Ham MD Entered By: Linton Ham on 10/20/2019 12:52:10 -------------------------------------------------------------------------------- SuperBill Details Patient Name: Date of Service: Tony [name] 10/20/2019 Medical Record [id number] Patient Account Number: [id number] Date of Birth/Sex: Treating RN: 1951-06-05 (68 y.o. [name]) Carlene Coria Primary Care Provider: Other Clinician: Referring  Provider: Treating Provider/Extender:Johneisha Broaden, Esperanza Richters in Treatment: 1 Diagnosis Coding ICD-10 Codes Code Description L89.614 Pressure ulcer of right heel, stage 4 E11.621 Type 2 diabetes mellitus with foot ulcer Facility Procedures CPT4 Code: [id number] Description: [id number] - DEB SUBQ TISSUE 20 SQ CM/< ICD-10 Diagnosis Description L89.614 Pressure ulcer of right heel, stage 4 E11.621 Type 2 diabetes mellitus with foot ulcer Modifier: Quantity: 1 Physician Procedures CPT4 Code: [id number] Description: [id number] - WC PHYS SUBQ TISS 20 SQ CM ICD-10 Diagnosis Description L89.614 Pressure ulcer of right heel, stage 4 E11.621 Type 2 diabetes mellitus with foot ulcer Modifier: Quantity: 1 Electronic Signature(s) Signed: 10/20/2019 5:53:09 PM By: Linton Ham MD Entered By: Linton Ham on 10/20/2019 12:52:38

## 2019-11-03 ENCOUNTER — Other Ambulatory Visit: Payer: Self-pay

## 2019-11-03 ENCOUNTER — Encounter (HOSPITAL_BASED_OUTPATIENT_CLINIC_OR_DEPARTMENT_OTHER): Payer: Medicare (Managed Care) | Attending: Internal Medicine | Admitting: Internal Medicine

## 2019-11-03 ENCOUNTER — Other Ambulatory Visit (HOSPITAL_COMMUNITY)
Admission: RE | Admit: 2019-11-03 | Discharge: 2019-11-03 | Disposition: A | Discharge status: Home / Self Care | Payer: Medicare (Managed Care) | Source: Other Acute Inpatient Hospital | Attending: Internal Medicine | Admitting: Internal Medicine

## 2019-11-03 DIAGNOSIS — N189 Chronic kidney disease, unspecified: Secondary | ICD-10-CM | POA: Insufficient documentation

## 2019-11-03 DIAGNOSIS — L89614 Pressure ulcer of right heel, stage 4: Secondary | ICD-10-CM | POA: Insufficient documentation

## 2019-11-03 DIAGNOSIS — E1122 Type 2 diabetes mellitus with diabetic chronic kidney disease: Secondary | ICD-10-CM | POA: Insufficient documentation

## 2019-11-03 DIAGNOSIS — L03115 Cellulitis of right lower limb: Secondary | ICD-10-CM | POA: Insufficient documentation

## 2019-11-03 DIAGNOSIS — B9562 Methicillin resistant Staphylococcus aureus infection as the cause of diseases classified elsewhere: Secondary | ICD-10-CM | POA: Insufficient documentation

## 2019-11-03 DIAGNOSIS — F0390 Unspecified dementia without behavioral disturbance: Secondary | ICD-10-CM | POA: Insufficient documentation

## 2019-11-03 DIAGNOSIS — I129 Hypertensive chronic kidney disease with stage 1 through stage 4 chronic kidney disease, or unspecified chronic kidney disease: Secondary | ICD-10-CM | POA: Insufficient documentation

## 2019-11-04 NOTE — Progress Notes (Addendum)
TARELL, MCNICHOL ([id number]) Visit Report for 11/03/2019 HPI Details Patient Name: Date of Service: Hal Hope 11/03/2019 11:00 A M Medical Record Number: [id number] Patient Account Number: [id number] Date of Birth/Sex: Treating RN: Dec 20, 1950 (69 y.o. [name]) Carlene Coria Primary Care Provider: Other Clinician: Referring Provider: Treating Provider/Extender: Cheree Ditto in Treatment: 3 History of Present Illness HPI Description: ADMISSION 10/12/2019 This is a 69 year old man who has some degree of dementia. Per discussion with his brother apparently he was functional living somewhere outside of [place] until November. He became acutely ill and required admission to hospital from 11/13 through 06/27/2019. He was treated for prostatitis with urosepsis and urinary retention. He required readmission to hospital from 12/11 through 12/15 for a TURP. At some point during this time he was discovered to have a ulcer on his heel although I do not see anything about it in [place] link. His brother is not aware exactly when this happened. However he is here for our review of a small but deep wound on the Achilles aspect of his right heel. We are not clear what [facility] is currently dressing this with as we did not get any notes. They apparently did not know that he had an appointment with [place] today. Past medical history includes seizure disorder, hypertension, dementia, hyperprolactinemia, chronic renal failure stage IIIb. Apparently a recent diagnosis of type 2 diabetes although is not on any treatment. ABIs in our clinic were noncompressible on the right 3/23; deep pressure injury on the Achilles aspect of the right heel. X-ray we did of the area was negative. We have been using silver collagen 4/6; deep injury on the Achilles aspect of the right heel just above the plantar surface. Previous x-ray was negative we have been using silver collagen. Arrives in clinic today with erythema and  tenderness around the wound. We really do not have any information of duration of this. He is at [place] under the Herbal[facility]) Signed: 11/03/2019 6:13:52 PM By: Linton Ham MD Entered By: Linton Ham on 11/03/2019 13:13:00 -------------------------------------------------------------------------------- Physical Exam Details Patient Name: Date of Service: [name], RO Y R. 11/03/2019 11:00 A M Medical Record Number: [id number] Patient Account Number: [id number] Date of Birth/Sex: Treating RN: 1951-04-22 (69 y.o. [name]) Carlene Coria Primary Care Provider: Other Clinician: Referring Provider: Treating Provider/Extender: Cheree Ditto in Treatment: 3 Constitutional Sitting or standing Blood Pressure is within target range for patient.. Pulse regular and within target range for patient.Marland Kitchen Respirations regular, non-labored and within target range.. Temperature is normal and within the target range for the patient.Marland Kitchen Appears in no distress. Respiratory work of breathing is normal. Cardiovascular Pedal pulses are palpable. Integumentary (Hair, Skin) Erythema and tenderness around the wound. Notes Wound exam; small wound at the base of the Achilles tendon however it has considerable depth. No debridement was necessary however because of the surrounding erythema and tenderness I did a swab culture of the base of this wound. There is no palpable bone but there was quite a bit of bleeding that required a pressure dressing to get hemostasis even after a swab culture Electronic Signature(s) Signed: 11/03/2019 6:13:52 PM By: Linton Ham MD Entered By: Linton Ham on 11/03/2019 13:14:21 -------------------------------------------------------------------------------- Physician Orders Details Patient Name: Date of Service: [name], RO Y R. 11/03/2019 11:00 A M Medical Record Number: [id number] Patient Account Number: [id number] Date of Birth/Sex: Treating  RN: [birth year] (69 y.o. [name]) Carlene Coria Primary Care Provider: Other Clinician: Referring Provider: Treating Provider/Extender:  Cheree Ditto in Treatment: 3 Verbal / Phone Orders: No Diagnosis Coding ICD-10 Coding Code Description L89.614 Pressure ulcer of right heel, stage 4 E11.621 Type 2 diabetes mellitus with foot ulcer Follow-up Appointments Return Appointment in 2 weeks. Dressing Change Frequency Wound #1 Right Calcaneus Change Dressing every other day. Wound Cleansing Wound #1 Right Calcaneus Clean wound with Normal Saline. - or wound cleanser Primary Wound Dressing Wound #1 Right Calcaneus Silver Collagen - moisten with saline Secondary Dressing Wound #1 Right Calcaneus Kerlix/Rolled Gauze Dry Gauze Heel Cup Off-Loading Turn and reposition every 2 hours Other: - float heels off of chair/bed with pillow under calves [facility] skilled nursing for wound care. - [facility] Laboratory naerobe culture (MICRO) - non healing wound right heel - (ICD10 L89.614 - Pressure ulcer of Bacteria identified in Unspecified specimen by A right heel, stage 4) LOINC Code: [id number] Convenience Name: Anerobic culture Electronic Signature(s) Signed: 11/17/2019 5:47:38 PM By: Linton Ham MD Signed: 01/26/2020 5:24:59 PM By: Carlene Coria RN Previous Signature: 11/03/2019 6:13:52 PM Version By: Linton Ham MD Previous Signature: 11/03/2019 6:43:44 PM Version By: Carlene Coria RN Entered By: Carlene Coria on 11/17/2019 12:01:42 -------------------------------------------------------------------------------- Problem List Details Patient Name: Date of Service: [name], RO Y R. 11/03/2019 11:00 A M Medical Record Number: [id number] Patient Account Number: [id number] Date of Birth/Sex: Treating RN: [birth year] (69 y.o. Oval Linsey Primary Care Provider: Other Clinician: Referring Provider: Treating Provider/Extender: Cheree Ditto in Treatment: 3 Active  Problems ICD-10 Evaluated Encounter Code Description Active Date Today Diagnosis L89.614 Pressure ulcer of right heel, stage 4 10/12/2019 No Yes E11.621 Type 2 diabetes mellitus with foot ulcer 10/12/2019 No Yes L03.115 Cellulitis of right lower limb 11/03/2019 No Yes Inactive Problems Resolved Problems Electronic Signature(s) Signed: 11/03/2019 6:13:52 PM By: Linton Ham MD Entered By: Linton Ham on 11/03/2019 13:11:52 -------------------------------------------------------------------------------- Progress Note Details Patient Name: Date of Service: [name], RO Y R. 11/03/2019 11:00 A M Medical Record Number: [id number] Patient Account Number: [id number] Date of Birth/Sex: Treating RN: 03/01/1951 (69 y.o. [name]) Carlene Coria Primary Care Provider: Other Clinician: Referring Provider: Treating Provider/Extender: Cheree Ditto in Treatment: 3 Subjective History of Present Illness (HPI) ADMISSION 10/12/2019 This is a 69 year old man who has some degree of dementia. Per discussion with his brother apparently he was functional living somewhere outside of [place] until November. He became acutely ill and required admission to hospital from 11/13 through 06/27/2019. He was treated for prostatitis with urosepsis and urinary retention. He required readmission to hospital from 12/11 through 12/15 for a TURP. At some point during this time he was discovered to have a ulcer on his heel although I do not see anything about it in [place] link. His brother is not aware exactly when this happened. However he is here for our review of a small but deep wound on the Achilles aspect of his right heel. We are not clear what [facility] is currently dressing this with as we did not get any notes. They apparently did not know that he had an appointment with [place] today. Past medical history includes seizure disorder, hypertension, dementia, hyperprolactinemia, chronic renal failure stage IIIb.  Apparently a recent diagnosis of type 2 diabetes although is not on any treatment. ABIs in our clinic were noncompressible on the right 3/23; deep pressure injury on the Achilles aspect of the right heel. X-ray we did of the area was negative. We have been using silver collagen 4/6; deep injury on the Achilles  aspect of the right heel just above the plantar surface. Previous x-ray was negative we have been using silver collagen. Arrives in clinic today with erythema and tenderness around the wound. We really do not have any information of duration of this. He is at [facility] under the pace program Objective Constitutional Sitting or standing Blood Pressure is within target range for patient.. Pulse regular and within target range for patient.Marland Kitchen Respirations regular, non-labored and within target range.. Temperature is normal and within the target range for the patient.Marland Kitchen Appears in no distress. Vitals Time Taken: 11:44 AM, Height: 71 in, Weight: 220 lbs, BMI: 30.7, Temperature: 98.0 F, Pulse: 81 bpm, Respiratory Rate: 18 breaths/min, Blood Pressure: 112/71 mmHg. Respiratory work of breathing is normal. Cardiovascular Pedal pulses are palpable. General Notes: Wound exam; small wound at the base of the Achilles tendon however it has considerable depth. No debridement was necessary however because of the surrounding erythema and tenderness I did a swab culture of the base of this wound. There is no palpable bone but there was quite a bit of bleeding that required a pressure dressing to get hemostasis even after a swab culture Integumentary (Hair, Skin) Erythema and tenderness around the wound. Wound #1 status is Open. Original cause of wound was Pressure Injury. The wound is located on the Right Calcaneus. The wound measures 0.2cm length x 1.5cm width x 0.8cm depth; 0.236cm^2 area and 0.188cm^3 volume. There is Fat Layer (Subcutaneous Tissue) Exposed exposed. There is no tunneling  or undermining noted. There is a medium amount of serosanguineous drainage noted. The wound margin is thickened. There is large (67-100%) pink, pale granulation within the wound bed. There is a small (1-33%) amount of necrotic tissue within the wound bed including Adherent Slough. Assessment Active Problems ICD-10 Pressure ulcer of right heel, stage 4 Type 2 diabetes mellitus with foot ulcer Cellulitis of right lower limb Plan Follow-up Appointments: Return Appointment in 2 weeks. Dressing Change Frequency: Wound #1 Right Calcaneus: Change Dressing every other day. Wound Cleansing: Wound #1 Right Calcaneus: Clean wound with Normal Saline. - or wound cleanser Primary Wound Dressing: Wound #1 Right Calcaneus: Silver Collagen - moisten with saline Secondary Dressing: Wound #1 Right Calcaneus: Kerlix/Rolled Gauze Dry Gauze Heel Cup Off-Loading: Turn and reposition every 2 hours Other: - float heels off of chair/bed with pillow under calves Home Health: [facility] skilled nursing for wound care. - [facility] 1. Empiric doxycycline 100 twice daily for 7 days while we await the culture of this 2. Continue silver collagen 3. Very unusual looking wound almost looks like a laceration injury rather than a pressure ulcer. 4. Swab culture done with empiric doxycycline as noted. Electronic Signature(s) Signed: 11/03/2019 6:13:52 PM By: Linton Ham MD Entered By: Linton Ham on 11/03/2019 13:15:40 -------------------------------------------------------------------------------- SuperBill Details Patient Name: Date of Service: [name], RO Y R. 11/03/2019 Medical Record Number: [id number] Patient Account Number: [id number] Date of Birth/Sex: Treating RN: 1951/03/10 (69 y.o. [name]) Carlene Coria Primary Care Provider: Other Clinician: Referring Provider: Treating Provider/Extender: Cheree Ditto in Treatment: 3 Diagnosis Coding ICD-10 Codes Code Description [phone] Pressure  ulcer of right heel, stage 4 E11.621 Type 2 diabetes mellitus with foot ulcer L03.115 Cellulitis of right lower limb Physician Procedures : CPT4 Code Description Modifier [id number] - WC PHYS LEVEL 4 - EST PT ICD-10 Diagnosis Description L89.614 Pressure ulcer of right heel, stage 4 E11.621 Type 2 diabetes mellitus with foot ulcer L03.115 Cellulitis of right lower limb Quantity: 1 Electronic Signature(s) Signed:  11/03/2019 6:13:52 PM By: Linton Ham MD Entered By: Linton Ham on 11/03/2019 13:16:15

## 2019-11-06 LAB — AEROBIC CULTURE W GRAM STAIN (SUPERFICIAL SPECIMEN)

## 2019-11-11 NOTE — Progress Notes (Signed)
SEGER, GOUKER ([id number]) Visit Report for 10/12/2019 Allergy List Details Patient Name: Date of Service: Tony [name], Tony [name] 10/12/2019 1:15 PM Medical Record [id number] Patient Account Number: [id number] Date of Birth/Sex: Treating RN: [birth year] (68 y.o. [name]) Carlene Coria Primary Care Azula Zappia: Other Clinician: Referring Keanan Melander: Treating Antoinette Borgwardt/Extender:Robson, Esperanza Richters in Treatment: 0 Allergies Active Allergies No Known Allergies Allergy Notes Electronic Signature(s) Signed: 10/12/2019 5:26:01 PM By: Carlene Coria RN Entered By: Carlene Coria on 10/12/2019 13:53:22 -------------------------------------------------------------------------------- Arrival Information Details Patient Name: Date of Service: Tony [name] 10/12/2019 1:15 PM Medical Record [id number] Patient Account Number: [id number] Date of Birth/Sex: Treating RN: 08-24-1950 (68 y.o. [name]) Carlene Coria Primary Care Kaemon Barnett: Other Clinician: Referring Lynlee Stratton: Treating Taurean Ju/Extender:Robson, Esperanza Richters in Treatment: 0 Visit Information Patient Arrived: Wheel Chair Arrival Time: 13:48 Accompanied By: self Transfer Assistance: None Patient Identification Verified: Yes Secondary Verification Process Completed: Yes Patient Requires Transmission-Based No Precautions: Patient Has Alerts: No Electronic Signature(s) Signed: 10/12/2019 5:26:01 PM By: Carlene Coria RN Entered By: Carlene Coria on 10/12/2019 13:51:30 -------------------------------------------------------------------------------- Clinic Level of Care Assessment Details Patient Name: Date of Service: Tony [name], Tony [name] 10/12/2019 1:15 PM Medical Record [id number] Patient Account Number: [id number] Date of Birth/Sex: Treating RN: 1950/09/14 (69 y.o. [name] Primary Care Jameon Deller: Other Clinician: Referring Earnest Mcgillis: Treating Broderick Fonseca/Extender:Robson, Esperanza Richters in Treatment: 0 Clinic Level of Care Assessment  Items TOOL 2 Quantity Score X - Use when only an EandM is performed on the INITIAL visit 1 0 ASSESSMENTS - Nursing Assessment / Reassessment X - General Physical Exam (combine w/ comprehensive assessment (listed just below) 1 20 when performed on new pt. evals) X - Comprehensive Assessment (HX, ROS, Risk Assessments, Wounds Hx, etc.) 1 25 ASSESSMENTS - Wound and Skin Assessment / Reassessment X - Simple Wound Assessment / Reassessment - one wound 1 5 []  - Complex Wound Assessment / Reassessment - multiple wounds 0 []  - Dermatologic / Skin Assessment (not related to wound area) 0 ASSESSMENTS - Ostomy and/or Continence Assessment and Care []  - Incontinence Assessment and Management 0 []  - Ostomy Care Assessment and Management (repouching, etc.) 0 PROCESS - Coordination of Care X - Simple Patient / Family Education for ongoing care 1 15 []  - Complex (extensive) Patient / Family Education for ongoing care 0 X - Staff obtains Programmer, systems, Records, Test Results / Process Orders 1 10 X - Staff telephones HHA, Nursing Homes / Clarify orders / etc 1 10 []  - Routine Transfer to another Facility (non-emergent condition) 0 []  - Routine Hospital Admission (non-emergent condition) 0 X - New Admissions / Biomedical engineer / Ordering NPWT, Apligraf, etc. 1 15 []  - Emergency Hospital Admission (emergent condition) 0 X - Simple Discharge Coordination 1 10 []  - Complex (extensive) Discharge Coordination 0 PROCESS - Special Needs []  - Pediatric / Minor Patient Management 0 []  - Isolation Patient Management 0 []  - Hearing / Language / Visual special needs 0 []  - Assessment of Community assistance (transportation, D/C planning, etc.) 0 []  - Additional assistance / Altered mentation 0 []  - Support Surface(s) Assessment (bed, cushion, seat, etc.) 0 INTERVENTIONS - Wound Cleansing / Measurement X - Wound Imaging (photographs - any number of wounds) 1 5 []  - Wound Tracing (instead of photographs) 0 X -  Simple Wound Measurement - one wound 1 5 []  - Complex Wound Measurement - multiple wounds 0 X - Simple Wound Cleansing - one wound 1 5 []  - Complex Wound Cleansing - multiple wounds 0 INTERVENTIONS - Wound Dressings []  - Small Wound Dressing  one or multiple wounds 0 X - Medium Wound Dressing one or multiple wounds 1 15 []  - Large Wound Dressing one or multiple wounds 0 []  - Application of Medications - injection 0 INTERVENTIONS - Miscellaneous []  - External ear exam 0 []  - Specimen Collection (cultures, biopsies, blood, body fluids, etc.) 0 []  - Specimen(s) / Culture(s) sent or taken to Lab for analysis 0 []  - Patient Transfer (multiple staff / Harrel Lemon Lift / Similar devices) 0 []  - Simple Staple / Suture removal (25 or less) 0 []  - Complex Staple / Suture removal (26 or more) 0 []  - Hypo / Hyperglycemic Management (close monitor of Blood Glucose) 0 X - Ankle / Brachial Index (ABI) - do not check if billed separately 1 15 Has the patient been seen at the hospital within the last three years: Yes Total Score: 155 Level Of Care: New/Established - Level 4 Electronic Signature(s) Signed: 10/12/2019 5:44:18 PM By: Levan Hurst RN, BSN Entered By: Levan Hurst on 10/12/2019 15:42:58 -------------------------------------------------------------------------------- Encounter Discharge Information Details Patient Name: Date of Service: Tony [name] 10/12/2019 1:15 PM Medical Record [id number] Patient Account Number: [id number] Date of Birth/Sex: Treating RN: 11-02-1950 (69 y.o. Hessie Diener Primary Care Juwan Vences: Other Clinician: Referring Tyrek Lawhorn: Treating Alisea Matte/Extender:Robson, Esperanza Richters in Treatment: 0 Encounter Discharge Information Items Discharge Condition: Stable Ambulatory Status: Wheelchair Discharge Destination: Skilled Nursing Facility Telephoned: No Orders Sent: Yes Transportation: Private Auto Accompanied By: self Schedule Follow-up Appointment:  Yes Clinical Summary of Care: Electronic Signature(s) Signed: 10/12/2019 5:25:09 PM By: Deon Pilling Entered By: Deon Pilling on 10/12/2019 15:07:15 -------------------------------------------------------------------------------- Lower Extremity Assessment Details Patient Name: Date of Service: Tony [name], Tony [name] 10/12/2019 1:15 PM Medical Record [id number] Patient Account Number: [id number] Date of Birth/Sex: Treating RN: 05/17/1951 (68 y.o. [name]) Carlene Coria Primary Care Tyechia Allmendinger: Other Clinician: Referring Lovie Agresta: Treating Kase Shughart/Extender:Robson, Esperanza Richters in Treatment: 0 Edema Assessment Assessed: [Left: No] [Right: No] E[Left: dema] [Right: :] Calf Left: Right: Point of Measurement: 42 cm From Medial Instep cm 32 cm Ankle Left: Right: Point of Measurement: 10 cm From Medial Instep cm 20 cm Notes non compressable Electronic Signature(s) Signed: 10/12/2019 5:26:01 PM By: Carlene Coria RN Entered By: Carlene Coria on 10/12/2019 14:17:25 -------------------------------------------------------------------------------- Multi Wound Chart Details Patient Name: Date of Service: Tony [name] 10/12/2019 1:15 PM Medical Record [id number] Patient Account Number: [id number] Date of Birth/Sex: Treating RN: 1951/05/11 (69 y.o. [name] Primary Care Olina Melfi: Other Clinician: Referring Malu Pellegrini: Treating Nicolette Gieske/Extender:Robson, Esperanza Richters in Treatment: 0 Vital Signs Height(in): 71 Pulse(bpm): 44 Weight(lbs): 220 Blood Pressure(mmHg): 131/74 Body Mass Index(BMI): 31 Temperature(F): 98.6 Respiratory 18 Rate(breaths/min): Photos: [1:No Photos] [N/A:N/A] Wound Location: [1:Right Calcaneus] [N/A:N/A] Wounding Event: [1:Pressure Injury] [N/A:N/A] Primary Etiology: [1:Pressure Ulcer] [N/A:N/A] Comorbid History: [1:Hypertension, Type II Diabetes, Dementia, Seizure Disorder] [N/A:N/A] Date Acquired: [1:08/31/2019] [N/A:N/A] Weeks of Treatment: [1:0]  [N/A:N/A] Wound Status: [1:Open] [N/A:N/A] Measurements L x W x D 0.8x2x1.6 [N/A:N/A] (cm) Area (cm) : [1:1.257] [N/A:N/A] Volume (cm) : [1:2.011] [N/A:N/A] % Reduction in Area: [1:0.00%] [N/A:N/A] % Reduction in Volume: 0.00% [N/A:N/A] Classification: [1:Category/Stage IV] [N/A:N/A] Exudate Amount: [1:Medium] [N/A:N/A] Exudate Type: [1:Serosanguineous] [N/A:N/A] Exudate Color: [1:red, brown] [N/A:N/A] Wound Margin: [1:Well defined, not attached] [N/A:N/A] Granulation Amount: [1:Medium (34-66%)] [N/A:N/A] Granulation Quality: [1:Pink] [N/A:N/A] Necrotic Amount: [1:Medium (34-66%)] [N/A:N/A] Exposed Structures: [1:Fat Layer (Subcutaneous Tissue) Exposed: Yes Fascia: No Tendon: No Muscle: No Joint: No Bone: No None] [N/A:N/A N/A] Treatment Notes Electronic Signature(s) Signed: 10/12/2019 5:44:18 PM By: Levan Hurst RN, BSN Signed: 10/13/2019 10:32:20 AM By: Linton Ham MD  Entered By: Linton Ham on 10/12/2019 14:59:43 -------------------------------------------------------------------------------- Multi-Disciplinary Care Plan Details Patient Name: Date of Service: Tony [name], Tony [name] 10/12/2019 1:15 PM Medical Record [id number] Patient Account Number: [id number] Date of Birth/Sex: Treating RN: 1950-12-18 (69 y.o. [name] Primary Care Gwenetta Devos: Other Clinician: Referring Malikah Lakey: Treating Deysha Cartier/Extender:Robson, Esperanza Richters in Treatment: 0 Active Inactive Abuse / Safety / Falls / Self Care Management Nursing Diagnoses: Potential for falls Potential for injury related to falls Goals: Patient will not experience any injury related to falls Date Initiated: 10/12/2019 Target Resolution Date: 11/06/2019 Goal Status: Active Patient/caregiver will verbalize/demonstrate measures taken to improve the patient's personal safety Date Initiated: 10/12/2019 Target Resolution Date: 11/06/2019 Goal Status: Active Patient/caregiver will verbalize/demonstrate measures  taken to prevent injury and/or falls Date Initiated: 10/12/2019 Target Resolution Date: 11/06/2019 Goal Status: Active Interventions: Assess Activities of Daily Living upon admission and as needed Assess fall risk on admission and as needed Assess: immobility, friction, shearing, incontinence upon admission and as needed Assess impairment of mobility on admission and as needed per policy Assess personal safety and home safety (as indicated) on admission and as needed Assess self care needs on admission and as needed Provide education on fall prevention Provide education on personal and home safety Notes: Nutrition Nursing Diagnoses: Impaired glucose control: actual or potential Potential for alteratiion in Nutrition/Potential for imbalanced nutrition Goals: Patient/caregiver agrees to and verbalizes understanding of need to use nutritional supplements and/or vitamins as prescribed Date Initiated: 10/12/2019 Target Resolution Date: 11/06/2019 Goal Status: Active Patient/caregiver will maintain therapeutic glucose control Date Initiated: 10/12/2019 Target Resolution Date: 11/06/2019 Goal Status: Active Interventions: Assess HgA1c results as ordered upon admission and as needed Assess patient nutrition upon admission and as needed per policy Provide education on elevated blood sugars and impact on wound healing Provide education on nutrition Notes: Pressure Nursing Diagnoses: Knowledge deficit related to causes and risk factors for pressure ulcer development Knowledge deficit related to management of pressures ulcers Potential for impaired tissue integrity related to pressure, friction, moisture, and shear Goals: Patient/caregiver will verbalize risk factors for pressure ulcer development Date Initiated: 10/12/2019 Target Resolution Date: 11/06/2019 Goal Status: Active Patient/caregiver will verbalize understanding of pressure ulcer management Date Initiated: 10/12/2019 Target  Resolution Date: 11/06/2019 Goal Status: Active Interventions: Assess: immobility, friction, shearing, incontinence upon admission and as needed Assess offloading mechanisms upon admission and as needed Assess potential for pressure ulcer upon admission and as needed Provide education on pressure ulcers Notes: Wound/Skin Impairment Nursing Diagnoses: Impaired tissue integrity Knowledge deficit related to ulceration/compromised skin integrity Goals: Patient/caregiver will verbalize understanding of skin care regimen Date Initiated: 10/12/2019 Target Resolution Date: 11/06/2019 Goal Status: Active Ulcer/skin breakdown will have a volume reduction of 30% by week 4 Date Initiated: 10/12/2019 Target Resolution Date: 11/06/2019 Goal Status: Active Interventions: Assess patient/caregiver ability to obtain necessary supplies Assess patient/caregiver ability to perform ulcer/skin care regimen upon admission and as needed Assess ulceration(s) every visit Provide education on ulcer and skin care Notes: Electronic Signature(s) Signed: 10/12/2019 5:44:18 PM By: Levan Hurst RN, BSN Entered By: Levan Hurst on 10/12/2019 14:46:47 -------------------------------------------------------------------------------- Pain Assessment Details Patient Name: Date of Service: Tony [name] 10/12/2019 1:15 PM Medical Record [id number] Patient Account Number: [id number] Date of Birth/Sex: Treating RN: April 08, 1951 (68 y.o. [name]) Carlene Coria Primary Care Maribel Hadley: Other Clinician: Referring Yassine Brunsman: Treating Nayan Proch/Extender:Robson, Esperanza Richters in Treatment: 0 Active Problems Location of Pain Severity and Description of Pain Patient Has Paino No Site Locations Pain Management and Medication Current Pain Management: Electronic Signature(s) Signed: 10/12/2019  5:26:01 PM By: Carlene Coria RN Entered By: Carlene Coria on 10/12/2019  14:19:03 -------------------------------------------------------------------------------- Patient/Caregiver Education Details Patient Name: Date of Service: Tony [name] 3/15/2021andnbsp1:15 PM Medical Record [id number] Patient Account Number: [id number] Date of Birth/Gender: 09-Jun-1951 (68 y.o. M) Treating RN: Levan Hurst Primary Care Physician: Other Clinician: Referring Physician: Treating Physician/Extender:Robson, Esperanza Richters in Treatment: 0 Education Assessment Education Provided To: Patient Education Topics Provided Elevated Blood Sugar/ Impact on Healing: Methods: Explain/Verbal Responses: State content correctly Nutrition: Methods: Explain/Verbal Responses: State content correctly Pressure: Methods: Explain/Verbal Responses: State content correctly Safety: Methods: Explain/Verbal Responses: State content correctly Wound/Skin Impairment: Methods: Explain/Verbal Responses: State content correctly Electronic Signature(s) Signed: 10/12/2019 5:44:18 PM By: Levan Hurst RN, BSN Entered By: Levan Hurst on 10/12/2019 14:47:14 -------------------------------------------------------------------------------- Wound Assessment Details Patient Name: Date of Service: Tony [name], Tony R. 10/12/2019 1:15 PM Medical Record [id number] Patient Account Number: [id number] Date of Birth/Sex: Treating RN: 10-Dec-1950 (69 y.o. [name] Primary Care Severa Jeremiah: Other Clinician: Referring Jayceon Troy: Treating Yaphet Smethurst/Extender:Robson, Esperanza Richters in Treatment: 0 Wound Status Wound Number: 1 Primary Pressure Ulcer Etiology: Wound Location: Right Calcaneus Wound Open Wounding Event: Pressure Injury Status: Date Acquired: 08/31/2019 Comorbid Hypertension, Type II Diabetes, Dementia, Weeks Of Treatment: 0 History: Seizure Disorder Clustered Wound: No Photos Wound Measurements Length: (cm) 0.8 Width: (cm) 2 Depth: (cm) 1.6 Area: (cm) 1.257 Volume: (cm)  2.011 Wound Description Classification: Category/Stage IV Wound Margin: Well defined, not attached Exudate Amount: Medium Exudate Type: Serosanguineous Exudate Color: red, brown Wound Bed Granulation Amount: Medium (34-66%) Granulation Quality: Pink Necrotic Amount: Medium (34-66%) Necrotic Quality: Adherent Slough Slough/Fibrino Yes Exposed Structure Fascia Exposed: No Fat Layer (Subcutaneous Tissue) Exposed: Ye Tendon Exposed: No Muscle Exposed: No Joint Exposed: No Bone Exposed: No % Reduction in Area: 0% % Reduction in Volume: 0% Epithelialization: None Tunneling: No Undermining: No s Electronic Signature(s) Signed: 10/13/2019 4:45:30 PM By: Mikeal Hawthorne EMT/HBOT Signed: 11/11/2019 9:03:51 AM By: Levan Hurst RN, BSN Previous Signature: 10/12/2019 5:44:18 PM Version By: Levan Hurst RN, BSN Entered By: Mikeal Hawthorne on 10/13/2019 10:01:01 -------------------------------------------------------------------------------- Vitals Details Patient Name: Date of Service: Tony [name] 10/12/2019 1:15 PM Medical Record [id number] Patient Account Number: [id number] Date of Birth/Sex: Treating RN: [birth year] (68 y.o. [name]) Carlene Coria Primary Care Toivo Bordon: Other Clinician: Referring Raymond Azure: Treating Sehar Sedano/Extender:Robson, Esperanza Richters in Treatment: 0 Vital Signs Time Taken: 13:51 Temperature (F): 98.6 Height (in): 71 Pulse (bpm): 78 Source: Stated Respiratory Rate (breaths/min): 18 Weight (lbs): 220 Blood Pressure (mmHg): 131/74 Source: Stated Reference Range: 80 - 120 mg / dl Body Mass Index (BMI): 30.7 Electronic Signature(s) Signed: 10/12/2019 5:26:01 PM By: Carlene Coria RN Entered By: Carlene Coria on 10/12/2019 13:52:46

## 2019-11-11 NOTE — Progress Notes (Signed)
Tony [name] ([id number]) Visit Report for 10/20/2019 Arrival Information Details Patient Name: Date of Service: Tony [name], Tony [name] 10/20/2019 11:00 AM Medical Record [id number] Patient Account Number: [id number] Date of Birth/Sex: Treating RN: 08-06-1950 (68 y.o. [name]) Carlene Coria Primary Care Baraka Klatt: Other Clinician: Referring Janiel Crisostomo: Treating Marceil Welp/Extender:Robson, Esperanza Richters in Treatment: 1 Visit Information History Since Last Visit Added or deleted any medications: No Patient Arrived: Wheel Chair Any new allergies or adverse reactions: No Arrival Time: 11:29 Had a fall or experienced change in No activities of daily living that may affect Accompanied By: self risk of falls: Transfer Assistance: None Signs or symptoms of abuse/neglect since last No Patient Identification Verified: Yes visito Secondary Verification Process Completed: Yes Hospitalized since last visit: No Patient Requires Transmission-Based No Implantable device outside of the clinic excluding No Precautions: cellular tissue based products placed in the center Patient Has Alerts: No since last visit: Has Dressing in Place as Prescribed: Yes Pain Present Now: No Electronic Signature(s) Signed: 11/11/2019 9:21:08 AM By: Sandre Kitty Entered By: Sandre Kitty on 10/20/2019 11:29:23 -------------------------------------------------------------------------------- Encounter Discharge Information Details Patient Name: Date of Service: Tony [name] 10/20/2019 11:00 AM Medical Record [id number] Patient Account Number: [id number] Date of Birth/Sex: Treating RN: 01-15-1951 (69 y.o. [name] Primary Care Mame Twombly: Other Clinician: Referring Jeanise Durfey: Treating Shermika Balthaser/Extender:Robson, Esperanza Richters in Treatment: 1 Encounter Discharge Information Items Post Procedure Vitals Discharge Condition: Stable Temperature (F): 97.5 Ambulatory Status: Ambulatory Pulse (bpm): 70 Discharge  Destination: Home Respiratory Rate (breaths/min): 18 Transportation: Private Auto Blood Pressure (mmHg): 117/65 Accompanied By: alone Schedule Follow-up Appointment: Yes Clinical Summary of Care: Patient Declined Electronic Signature(s) Signed: 11/11/2019 9:02:47 AM By: Levan Hurst RN, BSN Entered By: Levan Hurst on 10/20/2019 15:23:34 -------------------------------------------------------------------------------- Lower Extremity Assessment Details Patient Name: Date of Service: Tony [name] 10/20/2019 11:00 AM Medical Record [id number] Patient Account Number: [id number] Date of Birth/Sex: Treating RN: 03-03-1951 (68 y.o. [name] Primary Care Daiveon Markman: Other Clinician: Referring Emberlin Verner: Treating Clio Gerhart/Extender:Robson, Esperanza Richters in Treatment: 1 Edema Assessment Assessed: [Left: No] [Right: No] Edema: [Left: N] [Right: o] Calf Left: Right: Point of Measurement: 42 cm From Medial Instep cm 32 cm Ankle Left: Right: Point of Measurement: 10 cm From Medial Instep cm 20 cm Vascular Assessment Pulses: Dorsalis Pedis Palpable: [Right:No] Electronic Signature(s) Signed: 11/11/2019 9:02:47 AM By: Levan Hurst RN, BSN Entered By: Levan Hurst on 10/20/2019 11:46:02 -------------------------------------------------------------------------------- Multi Wound Chart Details Patient Name: Date of Service: Tony [name] 10/20/2019 11:00 AM Medical Record [id number] Patient Account Number: [id number] Date of Birth/Sex: Treating RN: [birth year] (68 y.o. [name]) Carlene Coria Primary Care Trayton Szabo: Other Clinician: Referring Christl Fessenden: Treating Raven Furnas/Extender:Robson, Esperanza Richters in Treatment: 1 Vital Signs Height(in): 71 Pulse(bpm): 42 Weight(lbs): 220 Blood Pressure(mmHg): 117/65 Body Mass Index(BMI): 31 Temperature(F): 97.5 Respiratory 18 Rate(breaths/min): Photos: [1:No Photos] [N/A:N/A] Wound Location: [1:Right Calcaneus]  [N/A:N/A] Wounding Event: [1:Pressure Injury] [N/A:N/A] Primary Etiology: [1:Pressure Ulcer] [N/A:N/A] Comorbid History: [1:Hypertension, Type II Diabetes, Dementia, Seizure Disorder] [N/A:N/A] Date Acquired: [1:08/31/2019] [N/A:N/A] Weeks of Treatment: [1:1] [N/A:N/A] Wound Status: [1:Open] [N/A:N/A] Measurements L x W x D 0.4x1.6x0.7 [N/A:N/A] (cm) Area (cm) : [1:0.503] [N/A:N/A] Volume (cm) : [1:0.352] [N/A:N/A] % Reduction in Area: [1:60.00%] [N/A:N/A] % Reduction in Volume: 82.50% [N/A:N/A] Classification: [1:Category/Stage IV] [N/A:N/A] Exudate Amount: [1:Medium] [N/A:N/A] Exudate Type: [1:Serosanguineous] [N/A:N/A] Exudate Color: [1:red, brown] [N/A:N/A] Wound Margin: [1:Thickened] [N/A:N/A] Granulation Amount: [1:Medium (34-66%)] [N/A:N/A] Granulation Quality: [1:Pink, Pale] [N/A:N/A] Necrotic Amount: [1:Medium (34-66%)] [N/A:N/A] Exposed Structures: [1:Fat Layer (Subcutaneous Tissue) Exposed: Yes Fascia: No Tendon: No Muscle: No  Joint: No Bone: No] [N/A:N/A] Epithelialization: [1:Small (1-33%)] [N/A:N/A] Debridement: [1:Debridement - Excisional] [N/A:N/A] Pre-procedure [1:12:20] [N/A:N/A] Verification/Time Out Taken: Pain Control: [1:Lidocaine 5% topical ointment] [N/A:N/A] Tissue Debrided: [1:Subcutaneous, Slough] [N/A:N/A] Level: [1:Skin/Subcutaneous Tissue] [N/A:N/A] Debridement Area (sq cm):0.64 [N/A:N/A] Instrument: [1:Curette] [N/A:N/A] Bleeding: [1:Minimum] [N/A:N/A] Hemostasis Achieved: [1:Pressure] [N/A:N/A] Procedural Pain: [1:0] [N/A:N/A] Post Procedural Pain: [1:0] [N/A:N/A] Debridement Treatment [1:Procedure was tolerated] [N/A:N/A] Response: [1:well] Post Debridement [1:0.4x1.6x1] [N/A:N/A] Measurements L x W x D (cm) Post Debridement [1:0.503] [N/A:N/A] Volume: (cm) Post Debridement Stage: [1:Category/Stage IV Debridement] [N/A:N/A N/A] Treatment Notes Electronic Signature(s) Signed: 10/20/2019 5:53:09 PM By: Linton Ham MD Signed: 10/20/2019  5:59:23 PM By: Carlene Coria RN Entered By: Linton Ham on 10/20/2019 12:49:08 -------------------------------------------------------------------------------- Multi-Disciplinary Care Plan Details Patient Name: Date of Service: Tony [name]. 10/20/2019 11:00 AM Medical Record [id number] Patient Account Number: [id number] Date of Birth/Sex: Treating RN: [birth year] (68 y.o. [name]) Carlene Coria Primary Care Dellia Donnelly: Other Clinician: Referring Icholas Irby: Treating Nema Oatley/Extender:Robson, Esperanza Richters in Treatment: 1 Active Inactive Abuse / Safety / Falls / Self Care Management Nursing Diagnoses: Potential for falls Potential for injury related to falls Goals: Patient will not experience any injury related to falls Date Initiated: 10/12/2019 Target Resolution Date: 11/06/2019 Goal Status: Active Patient/caregiver will verbalize/demonstrate measures taken to improve the patient's personal safety Date Initiated: 10/12/2019 Target Resolution Date: 11/06/2019 Goal Status: Active Patient/caregiver will verbalize/demonstrate measures taken to prevent injury and/or falls Date Initiated: 10/12/2019 Target Resolution Date: 11/06/2019 Goal Status: Active Interventions: Assess Activities of Daily Living upon admission and as needed Assess fall risk on admission and as needed Assess: immobility, friction, shearing, incontinence upon admission and as needed Assess impairment of mobility on admission and as needed per policy Assess personal safety and home safety (as indicated) on admission and as needed Assess self care needs on admission and as needed Provide education on fall prevention Provide education on personal and home safety Notes: Nutrition Nursing Diagnoses: Impaired glucose control: actual or potential Potential for alteratiion in Nutrition/Potential for imbalanced nutrition Goals: Patient/caregiver agrees to and verbalizes understanding of need to use nutritional supplements  and/or vitamins as prescribed Date Initiated: 10/12/2019 Target Resolution Date: 11/06/2019 Goal Status: Active Patient/caregiver will maintain therapeutic glucose control Date Initiated: 10/12/2019 Target Resolution Date: 11/06/2019 Goal Status: Active Interventions: Assess HgA1c results as ordered upon admission and as needed Assess patient nutrition upon admission and as needed per policy Provide education on elevated blood sugars and impact on wound healing Provide education on nutrition Treatment Activities: Education provided on Nutrition : 10/12/2019 Notes: Pressure Nursing Diagnoses: Knowledge deficit related to causes and risk factors for pressure ulcer development Knowledge deficit related to management of pressures ulcers Potential for impaired tissue integrity related to pressure, friction, moisture, and shear Goals: Patient/caregiver will verbalize risk factors for pressure ulcer development Date Initiated: 10/12/2019 Target Resolution Date: 11/06/2019 Goal Status: Active Patient/caregiver will verbalize understanding of pressure ulcer management Date Initiated: 10/12/2019 Target Resolution Date: 11/06/2019 Goal Status: Active Interventions: Assess: immobility, friction, shearing, incontinence upon admission and as needed Assess offloading mechanisms upon admission and as needed Assess potential for pressure ulcer upon admission and as needed Provide education on pressure ulcers Notes: Wound/Skin Impairment Nursing Diagnoses: Impaired tissue integrity Knowledge deficit related to ulceration/compromised skin integrity Goals: Patient/caregiver will verbalize understanding of skin care regimen Date Initiated: 10/12/2019 Target Resolution Date: 11/06/2019 Goal Status: Active Ulcer/skin breakdown will have a volume reduction of 30% by week 4 Date Initiated: 10/12/2019 Target Resolution Date: 11/06/2019 Goal Status: Active Interventions: Assess patient/caregiver ability  to  obtain necessary supplies Assess patient/caregiver ability to perform ulcer/skin care regimen upon admission and as needed Assess ulceration(s) every visit Provide education on ulcer and skin care Notes: Electronic Signature(s) Signed: 10/20/2019 5:59:23 PM By: Carlene Coria RN Entered By: Carlene Coria on 10/20/2019 11:04:31 -------------------------------------------------------------------------------- Pain Assessment Details Patient Name: Date of Service: Tony [name], Tony [name] 10/20/2019 11:00 AM Medical Record [id number] Patient Account Number: [id number] Date of Birth/Sex: Treating RN: April 13, 1951 (68 y.o. [name]) Carlene Coria Primary Care Simmie Garin: Other Clinician: Referring Fathima Bartl: Treating Odie Rauen/Extender:Robson, Esperanza Richters in Treatment: 1 Active Problems Location of Pain Severity and Description of Pain Patient Has Paino No Site Locations Pain Management and Medication Current Pain Management: Electronic Signature(s) Signed: 10/20/2019 5:59:23 PM By: Carlene Coria RN Signed: 11/11/2019 9:21:08 AM By: Sandre Kitty Entered By: Sandre Kitty on 10/20/2019 11:29:43 -------------------------------------------------------------------------------- Patient/Caregiver Education Details Patient Name: Date of Service: Tony [name], Tony R. 3/23/2021andnbsp11:00 AM Medical Record [id number] Patient Account Number: [id number] Date of Birth/Gender: 07-30-1951 (68 y.o. M) Treating RN: Carlene Coria Primary Care Physician: Other Clinician: Referring Physician: Treating Physician/Extender:Robson, Esperanza Richters in Treatment: 1 Education Assessment Education Provided To: Patient Education Topics Provided Elevated Blood Sugar/ Impact on Healing: Methods: Explain/Verbal Responses: State content correctly Nutrition: Methods: Explain/Verbal Responses: State content correctly Electronic Signature(s) Signed: 10/20/2019 5:59:23 PM By: Carlene Coria RN Entered By: Carlene Coria on  10/20/2019 11:04:53 -------------------------------------------------------------------------------- Wound Assessment Details Patient Name: Date of Service: Tony [name], Tony [name] 10/20/2019 11:00 AM Medical Record [id number] Patient Account Number: [id number] Date of Birth/Sex: Treating RN: 1950-10-08 (68 y.o. [name]) Carlene Coria Primary Care Ola Raap: Other Clinician: Referring Lyndol Vanderheiden: Treating Youlanda Tomassetti/Extender:Robson, Esperanza Richters in Treatment: 1 Wound Status Wound Number: 1 Primary Pressure Ulcer Etiology: Wound Location: Right Calcaneus Wound Open Wounding Event: Pressure Injury Status: Date Acquired: 08/31/2019 Comorbid Hypertension, Type II Diabetes, Dementia, Weeks Of Treatment: 1 History: Seizure Disorder Clustered Wound: No Photos Photo Uploaded By: Mikeal Hawthorne on 10/23/2019 14:32:40 Wound Measurements Length: (cm) 0.4 Width: (cm) 1.6 Depth: (cm) 0.7 Area: (cm) 0.503 Volume: (cm) 0.352 Wound Description Classification: Category/Stage IV Wound Margin: Thickened Exudate Amount: Medium Exudate Type: Serosanguineous Exudate Color: red, brown Wound Bed Granulation Amount: Medium (34-66%) Granulation Quality: Pink, Pale Necrotic Amount: Medium (34-66%) Necrotic Quality: Adherent Slough After Cleansing: No rino Yes Exposed Structure sed: No Subcutaneous Tissue) Exposed: Yes sed: No sed: No ed: No d: No % Reduction in Area: 60% % Reduction in Volume: 82.5% Epithelialization: Small (1-33%) Tunneling: No Undermining: No Foul Odor Slough/Fib Fascia Expo Fat Layer ( Tendon Expo Muscle Expo Joint Expos Bone Expose Electronic Signature(s) Signed: 10/20/2019 5:59:23 PM By: Carlene Coria RN Signed: 11/11/2019 9:02:47 AM By: Levan Hurst RN, BSN Entered By: Levan Hurst on 10/20/2019 11:46:25 -------------------------------------------------------------------------------- Vitals Details Patient Name: Date of Service: Tony [name]. 10/20/2019 11:00  AM Medical Record [id number] Patient Account Number: [id number] Date of Birth/Sex: Treating RN: 1950-11-01 (68 y.o. [name]) Carlene Coria Primary Care Calem Cocozza: Other Clinician: Referring Allena Pietila: Treating Shereece Wellborn/Extender:Robson, Esperanza Richters in Treatment: 1 Vital Signs Time Taken: 11:29 Temperature (F): 97.5 Height (in): 71 Pulse (bpm): 70 Weight (lbs): 220 Respiratory Rate (breaths/min): 18 Body Mass Index (BMI): 30.7 Blood Pressure (mmHg): 117/65 Reference Range: 80 - 120 mg / dl Electronic Signature(s) Signed: 11/11/2019 9:21:08 AM By: Sandre Kitty Entered By: Sandre Kitty on 10/20/2019 11:29:38

## 2019-11-11 NOTE — Progress Notes (Signed)
Tony [name], Tony [name] ([id number]) Visit Report for 11/03/2019 Arrival Information Details Patient Name: Date of Service: Tony [name], Tony [name] 11/03/2019 11:00 AM Medical Record [id number] Patient Account Number: [id number] Date of Birth/Sex: Treating RN: 1950-12-03 (69 y.o. Tony [name]) Carlene Coria Primary Care Nikky Duba: Other Clinician: Referring Jennifer Holland: Treating Veer Elamin/Extender:Robson, Esperanza Richters in Treatment: 3 Visit Information History Since Last Visit Added or deleted any medications: No Patient Arrived: Wheel Chair Any new allergies or adverse reactions: No Arrival Time: 11:42 Had a fall or experienced change in No activities of daily living that may affect Accompanied By: self risk of falls: Transfer Assistance: None Signs or symptoms of abuse/neglect since last No Patient Identification Verified: Yes visito Secondary Verification Process Completed: Yes Hospitalized since last visit: No Patient Requires Transmission-Based No Implantable device outside of the clinic excluding No Precautions: cellular tissue based products placed in the center Patient Has Alerts: No since last visit: Has Dressing in Place as Prescribed: Yes Pain Present Now: Yes Electronic Signature(s) Signed: 11/11/2019 9:20:10 AM By: Sandre Kitty Entered By: Sandre Kitty on 11/03/2019 11:42:49 -------------------------------------------------------------------------------- Encounter Discharge Information Details Patient Name: Date of Service: Tony [name] 11/03/2019 11:00 AM Medical Record [id number] Patient Account Number: [id number] Date of Birth/Sex: Treating RN: 04/04/1951 (69 y.o. Tony [name] Primary Care Krisann Mckenna: Other Clinician: Referring Kaidance Pantoja: Treating Shanterria Franta/Extender:Robson, Esperanza Richters in Treatment: 3 Encounter Discharge Information Items Discharge Condition: Stable Ambulatory Status: Wheelchair Discharge Destination: Home Transportation: Other Accompanied By:  self Schedule Follow-up Appointment: Yes Clinical Summary of Care: Patient Declined Electronic Signature(s) Signed: 11/03/2019 6:07:04 PM By: Kela Millin Entered By: Kela Millin on 11/03/2019 16:40:35 -------------------------------------------------------------------------------- Lower Extremity Assessment Details Patient Name: Date of Service: Tony [name], Tony [name] 11/03/2019 11:00 AM Medical Record [id number] Patient Account Number: [id number] Date of Birth/Sex: Treating RN: 01-25-1951 (69 y.o. Tony [name] Primary Care Jeoffrey Eleazer: Other Clinician: Referring Terika Pillard: Treating Ardyce Heyer/Extender:Robson, Esperanza Richters in Treatment: 3 Edema Assessment Assessed: [Left: No] [Right: No] Edema: [Left: N] [Right: o] Calf Left: Right: Point of Measurement: 42 cm From Medial Instep cm 32 cm Ankle Left: Right: Point of Measurement: 10 cm From Medial Instep cm 21 cm Vascular Assessment Pulses: Dorsalis Pedis Palpable: [Right:Yes] Electronic Signature(s) Signed: 11/03/2019 6:07:04 PM By: Kela Millin Entered By: Kela Millin on 11/03/2019 12:00:17 -------------------------------------------------------------------------------- Multi Wound Chart Details Patient Name: Date of Service: Tony [name] 11/03/2019 11:00 AM Medical Record [id number] Patient Account Number: [id number] Date of Birth/Sex: Treating RN: 1950-10-07 (69 y.o. Tony [name]) Carlene Coria Primary Care Mechille Varghese: Other Clinician: Referring Scarlett Portlock: Treating Viviane Semidey/Extender:Robson, Esperanza Richters in Treatment: 3 Vital Signs Height(in): 71 Pulse(bpm): 21 Weight(lbs): 220 Blood Pressure(mmHg): 112/71 Body Mass Index(BMI): 31 Temperature(F): 98.0 Respiratory 18 Rate(breaths/min): Photos: [1:No Photos] [N/A:N/A] Wound Location: [1:Right Calcaneus] [N/A:N/A] Wounding Event: [1:Pressure Injury] [N/A:N/A] Primary Etiology: [1:Pressure Ulcer] [N/A:N/A] Comorbid History: [1:Hypertension, Type II  Diabetes, Dementia, Seizure Disorder] [N/A:N/A] Date Acquired: [1:08/31/2019] [N/A:N/A] Weeks of Treatment: [1:3] [N/A:N/A] Wound Status: [1:Open] [N/A:N/A] Measurements L x W x D 0.2x1.5x0.8 [N/A:N/A] (cm) Area (cm) : [1:0.236] [N/A:N/A] Volume (cm) : [1:0.188] [N/A:N/A] % Reduction in Area: [1:81.20%] [N/A:N/A] % Reduction in Volume: 90.70% [N/A:N/A] Classification: [1:Category/Stage IV] [N/A:N/A] Exudate Amount: [1:Medium] [N/A:N/A] Exudate Type: [1:Serosanguineous] [N/A:N/A] Exudate Color: [1:red, brown] [N/A:N/A] Wound Margin: [1:Thickened] [N/A:N/A] Granulation Amount: [1:Large (67-100%)] [N/A:N/A] Granulation Quality: [1:Pink, Pale] [N/A:N/A] Necrotic Amount: [1:Small (1-33%)] [N/A:N/A] Exposed Structures: [1:Fat Layer (Subcutaneous Tissue) Exposed: Yes Fascia: No Tendon: No Muscle: No Joint: No Bone: No Small (1-33%)] [N/A:N/A N/A] Treatment Notes Electronic Signature(s) Signed: 11/03/2019 6:13:52 PM By: Linton Ham MD Signed: 11/03/2019  6:43:44 PM By: Carlene Coria RN Entered By: Linton Ham on 11/03/2019 13:11:59 -------------------------------------------------------------------------------- Multi-Disciplinary Care Plan Details Patient Name: Date of Service: Tony [name]. 11/03/2019 11:00 AM Medical Record [id number] Patient Account Number: [id number] Date of Birth/Sex: Treating RN: 1950-08-22 (69 y.o. Tony [name]) Carlene Coria Primary Care Mckenlee Mangham: Other Clinician: Referring Treyon Wymore: Treating Lincy Belles/Extender:Robson, Esperanza Richters in Treatment: 3 Active Inactive Abuse / Safety / Falls / Self Care Management Nursing Diagnoses: Potential for falls Potential for injury related to falls Goals: Patient will not experience any injury related to falls Date Initiated: 10/12/2019 Target Resolution Date: 11/06/2019 Goal Status: Active Patient/caregiver will verbalize/demonstrate measures taken to improve the patient's personal safety Date Initiated: 10/12/2019 Target  Resolution Date: 11/06/2019 Goal Status: Active Patient/caregiver will verbalize/demonstrate measures taken to prevent injury and/or falls Date Initiated: 10/12/2019 Target Resolution Date: 11/06/2019 Goal Status: Active Interventions: Assess Activities of Daily Living upon admission and as needed Assess fall risk on admission and as needed Assess: immobility, friction, shearing, incontinence upon admission and as needed Assess impairment of mobility on admission and as needed per policy Assess personal safety and home safety (as indicated) on admission and as needed Assess self care needs on admission and as needed Provide education on fall prevention Provide education on personal and home safety Notes: Nutrition Nursing Diagnoses: Impaired glucose control: actual or potential Potential for alteratiion in Nutrition/Potential for imbalanced nutrition Goals: Patient/caregiver agrees to and verbalizes understanding of need to use nutritional supplements and/or vitamins as prescribed Date Initiated: 10/12/2019 Target Resolution Date: 11/06/2019 Goal Status: Active Patient/caregiver will maintain therapeutic glucose control Date Initiated: 10/12/2019 Target Resolution Date: 11/06/2019 Goal Status: Active Interventions: Assess HgA1c results as ordered upon admission and as needed Assess patient nutrition upon admission and as needed per policy Provide education on elevated blood sugars and impact on wound healing Provide education on nutrition Treatment Activities: Education provided on Nutrition : 10/20/2019 Notes: Pressure Nursing Diagnoses: Knowledge deficit related to causes and risk factors for pressure ulcer development Knowledge deficit related to management of pressures ulcers Potential for impaired tissue integrity related to pressure, friction, moisture, and shear Goals: Patient/caregiver will verbalize risk factors for pressure ulcer development Date Initiated:  10/12/2019 Target Resolution Date: 11/06/2019 Goal Status: Active Patient/caregiver will verbalize understanding of pressure ulcer management Date Initiated: 10/12/2019 Target Resolution Date: 11/06/2019 Goal Status: Active Interventions: Assess: immobility, friction, shearing, incontinence upon admission and as needed Assess offloading mechanisms upon admission and as needed Assess potential for pressure ulcer upon admission and as needed Provide education on pressure ulcers Notes: Wound/Skin Impairment Nursing Diagnoses: Impaired tissue integrity Knowledge deficit related to ulceration/compromised skin integrity Goals: Patient/caregiver will verbalize understanding of skin care regimen Date Initiated: 10/12/2019 Target Resolution Date: 11/06/2019 Goal Status: Active Ulcer/skin breakdown will have a volume reduction of 30% by week 4 Date Initiated: 10/12/2019 Target Resolution Date: 11/06/2019 Goal Status: Active Interventions: Assess patient/caregiver ability to obtain necessary supplies Assess patient/caregiver ability to perform ulcer/skin care regimen upon admission and as needed Assess ulceration(s) every visit Provide education on ulcer and skin care Notes: Electronic Signature(s) Signed: 11/03/2019 6:43:44 PM By: Carlene Coria RN Entered By: Carlene Coria on 11/03/2019 11:55:52 -------------------------------------------------------------------------------- Pain Assessment Details Patient Name: Date of Service: Tony [name], Tony [name] 11/03/2019 11:00 AM Medical Record [id number] Patient Account Number: [id number] Date of Birth/Sex: Treating RN: 08/13/1950 (69 y.o. Tony [name]) Carlene Coria Primary Care Garlon Tuggle: Other Clinician: Referring Aqsa Sensabaugh: Treating Marilyn Nihiser/Extender:Robson, Esperanza Richters in Treatment: 3 Active Problems Location of Pain Severity and Description of Pain Patient Has Paino Yes  Site Locations Rate the pain. Current Pain Level: 4 Pain Management and  Medication Current Pain Management: Electronic Signature(s) Signed: 11/03/2019 6:43:44 PM By: Carlene Coria RN Signed: 11/11/2019 9:20:10 AM By: Sandre Kitty Entered By: Sandre Kitty on 11/03/2019 11:43:01 -------------------------------------------------------------------------------- Patient/Caregiver Education Details Patient Name: Date of Service: [name], Tony R. 4/6/2021andnbsp11:00 AM Medical Record [id number] Patient Account Number: [id number] Date of Birth/Gender: [birth year] (69 y.o. M) Treating RN: Carlene Coria Primary Care Physician: Other Clinician: Referring Physician: Treating Physician/Extender:Robson, Esperanza Richters in Treatment: 3 Education Assessment Education Provided To: Patient Education Topics Provided Pressure: Methods: Explain/Verbal Responses: State content correctly Safety: Methods: Explain/Verbal Responses: State content correctly Electronic Signature(s) Signed: 11/03/2019 6:43:44 PM By: Carlene Coria RN Entered By: Carlene Coria on 11/03/2019 11:56:14 -------------------------------------------------------------------------------- Wound Assessment Details Patient Name: Date of Service: Tony [name], Tony [name] 11/03/2019 11:00 AM Medical Record [id number] Patient Account Number: [id number] Date of Birth/Sex: Treating RN: 23-Nov-1950 (69 y.o. Tony [name]) Carlene Coria Primary Care Adaysha Dubinsky: Other Clinician: Referring Dynasty Holquin: Treating Danine Hor/Extender:Robson, Esperanza Richters in Treatment: 3 Wound Status Wound Number: 1 Primary Pressure Ulcer Etiology: Wound Location: Right Calcaneus Wound Open Wounding Event: Pressure Injury Status: Date Acquired: 08/31/2019 Comorbid Hypertension, Type II Diabetes, Dementia, Weeks Of Treatment: 3 History: Seizure Disorder Clustered Wound: No Wound Measurements Length: (cm) 0.2 Width: (cm) 1.5 Depth: (cm) 0.8 Area: (cm) 0.236 Volume: (cm) 0.188 Wound Description Classification: Category/Stage IV Wound Margin:  Thickened Exudate Amount: Medium Exudate Type: Serosanguineous Exudate Color: red, brown Wound Bed Granulation Amount: Large (67-100%) Granulation Quality: Pink, Pale Necrotic Amount: Small (1-33%) Necrotic Quality: Adherent Slough r After Cleansing: No ino Yes Exposed Structure sed: No Subcutaneous Tissue) Exposed: Yes sed: No sed: No ed: No d: No % Reduction in Area: 81.2% % Reduction in Volume: 90.7% Epithelialization: Small (1-33%) Tunneling: No Undermining: No Foul Odo Slough/Fibr Fascia Expo Fat Layer ( Tendon Expo Muscle Expo Joint Expos Bone Expose Treatment Notes Wound #1 (Right Calcaneus) 1. Cleanse With Wound Cleanser 3. Primary Dressing Applied Collegen AG Hydrogel or K-Y Jelly 4. Secondary Dressing Dry Gauze Heel Cup 5. Secured With Recruitment consultant) Signed: 11/03/2019 6:07:04 PM By: Kela Millin Signed: 11/03/2019 6:43:44 PM By: Carlene Coria RN Entered By: Kela Millin on 11/03/2019 12:03:54 -------------------------------------------------------------------------------- Vitals Details Patient Name: Date of Service: Tony [name]. 11/03/2019 11:00 AM Medical Record [id number] Patient Account Number: [id number] Date of Birth/Sex: Treating RN: Jul 29, 1951 (69 y.o. Tony [name]) Carlene Coria Primary Care Dniya Neuhaus: Other Clinician: Referring Leyana Whidden: Treating Piera Downs/Extender:Robson, Esperanza Richters in Treatment: 3 Vital Signs Time Taken: 11:44 Temperature (F): 98.0 Height (in): 71 Pulse (bpm): 81 Weight (lbs): 220 Respiratory Rate (breaths/min): 18 Body Mass Index (BMI): 30.7 Blood Pressure (mmHg): 112/71 Reference Range: 80 - 120 mg / dl Electronic Signature(s) Signed: 11/11/2019 9:20:10 AM By: Sandre Kitty Entered By: Sandre Kitty on 11/03/2019 11:45:00

## 2019-11-17 ENCOUNTER — Other Ambulatory Visit: Payer: Self-pay

## 2019-11-17 ENCOUNTER — Encounter (HOSPITAL_BASED_OUTPATIENT_CLINIC_OR_DEPARTMENT_OTHER): Payer: Medicare (Managed Care) | Admitting: Internal Medicine

## 2019-11-17 DIAGNOSIS — N189 Chronic kidney disease, unspecified: Secondary | ICD-10-CM | POA: Diagnosis not present

## 2019-11-17 DIAGNOSIS — L89614 Pressure ulcer of right heel, stage 4: Secondary | ICD-10-CM | POA: Diagnosis not present

## 2019-11-17 DIAGNOSIS — I129 Hypertensive chronic kidney disease with stage 1 through stage 4 chronic kidney disease, or unspecified chronic kidney disease: Secondary | ICD-10-CM | POA: Diagnosis not present

## 2019-11-17 DIAGNOSIS — L03115 Cellulitis of right lower limb: Secondary | ICD-10-CM | POA: Diagnosis not present

## 2019-11-17 DIAGNOSIS — B9562 Methicillin resistant Staphylococcus aureus infection as the cause of diseases classified elsewhere: Secondary | ICD-10-CM | POA: Diagnosis not present

## 2019-11-17 DIAGNOSIS — F0390 Unspecified dementia without behavioral disturbance: Secondary | ICD-10-CM | POA: Diagnosis not present

## 2019-11-17 DIAGNOSIS — E1122 Type 2 diabetes mellitus with diabetic chronic kidney disease: Secondary | ICD-10-CM | POA: Diagnosis not present

## 2019-11-17 NOTE — Progress Notes (Signed)
Tony [name] ([id number]) Visit Report for 11/17/2019 HPI Details Patient Name: Date of Service: Tony [name] 11/17/2019 10:30 AM Medical Record [id number] Patient Account Number: [id number] Date of Birth/Sex: Treating RN: [birth year] (69 y.o. Tony [name]) Carlene Coria Primary Care Provider: Other Clinician: Referring Provider: Treating Provider/Extender:Kaithlyn Teagle, Esperanza Richters in Treatment: 5 History of Present Illness HPI Description: ADMISSION 10/12/2019 This is a 69 year old man who has some degree of dementia. Per discussion with his brother apparently he was functional living somewhere outside of [place] until November. He became acutely ill and required admission to hospital from 11/13 through 06/27/2019. He was treated for prostatitis with urosepsis and urinary retention. He required readmission to hospital from 12/11 through 12/15 for a TURP. At some point during this time he was discovered to have a ulcer on his heel although I do not see anything about it in [facility]. His brother is not aware exactly when this happened. However he is here for our review of a small but deep wound on the Achilles aspect of his right heel. We are not clear what [facility] is currently dressing this with as we did not get any notes. They apparently did not know that he had an appointment with [place] today. Past medical history includes seizure disorder, hypertension, dementia, hyperprolactinemia, chronic renal failure stage IIIb. Apparently a recent diagnosis of type 2 diabetes although is not on any treatment. ABIs in our clinic were noncompressible on the right 3/23; deep pressure injury on the Achilles aspect of the right heel. X-ray we did of the area was negative. We have been using silver collagen 4/6; deep injury on the Achilles aspect of the right heel just above the plantar surface. Previous x-ray was negative we have been using silver collagen. Arrives in clinic today with erythema and  tenderness around the wound. We really do not have any information of duration of this. He is at [facility] under the pace program 4/20; 2-week follow-up. Culture I did last time showed abundant MRSA according to my note I put him on empiric doxycycline although I cannot find the culture results for communication with pace. He has had [facility]. We have been using silver collagen on the wound. Electronic Signature(s) Signed: 11/17/2019 5:47:38 PM By: Linton Ham MD Entered By: Linton Ham on 11/17/2019 13:04:07 -------------------------------------------------------------------------------- Physical Exam Details Patient Name: Date of Service: Tony [name] 11/17/2019 10:30 AM Medical Record [id number] Patient Account Number: [id number] Date of Birth/Sex: Treating RN: 30-Sep-1950 (69 y.o. Tony [name]) Carlene Coria Primary Care Provider: Other Clinician: Referring Provider: Treating Provider/Extender:Bertie Simien, Esperanza Richters in Treatment: 5 Constitutional Patient is hypertensive.. Pulse regular and within target range for patient.Marland Kitchen Respirations regular, non-labored and within target range.. Temperature is normal and within the target range for the patient.Marland Kitchen Appears in no distress. Cardiovascular Pedal pulses are palpable on the right. Notes Wound exam; the wound is contracted somewhat but it still has considerable relative depth. There is less tenderness around the wound. There is palpable bone here. The soft tissue around does not look to be infected Electronic Signature(s) Signed: 11/17/2019 5:47:38 PM By: Linton Ham MD Entered By: Linton Ham on 11/17/2019 13:06:23 -------------------------------------------------------------------------------- Physician Orders Details Patient Name: Date of Service: Tony [name] 11/17/2019 10:30 AM Medical Record [id number] Patient Account Number: [id number] Date of Birth/Sex: Treating RN: Dec 31, 1950 (69 y.o. Tony [name]) Carlene Coria Primary Care Provider: Other Clinician: Referring Provider: Treating Provider/Extender:Mathew Postiglione, Esperanza Richters in Treatment: 5 Verbal / Phone Orders: No Diagnosis Coding ICD-10 Coding Code Description [phone]  Pressure ulcer of right heel, stage 4 E11.621 Type 2 diabetes mellitus with foot ulcer L03.115 Cellulitis of right lower limb Follow-up Appointments Return Appointment in 2 weeks. Dressing Change Frequency Wound #1 Right Calcaneus Change dressing every day. Wound Cleansing Wound #1 Right Calcaneus Clean wound with Normal Saline. - or wound cleanser Primary Wound Dressing Wound #1 Right Calcaneus Calcium Alginate with Silver Secondary Dressing Wound #1 Right Calcaneus Kerlix/Rolled Gauze Dry Gauze Heel Cup Off-Loading Turn and reposition every 2 hours Other: - float heels off of chair/bed with pillow under calves [facility] for wound care. - [facility] Patient Medications Allergies: No Known Allergies Notifications Medication Indication Start End doxycycline hyclate 11/17/2019 DOSE 1 - oral 100 mg tablet - 1 tablet oral, 2 times per day Electronic Signature(s) Signed: 11/17/2019 5:39:56 PM By: Carlene Coria RN Signed: 11/17/2019 5:47:38 PM By: Linton Ham MD Entered By: Carlene Coria on 11/17/2019 12:05:25 -------------------------------------------------------------------------------- Prescription 11/17/2019 Patient Name: Tony [name] Provider: Linton Ham MD Date of Birth: March 14, 1951 NPI#: [id number] Sex: M DEA#: [id number] Phone #: [phone] License #: [id number] Patient Address: [facility] [address] [place], [place] [address] [place], [place] [address] [phone] Allergies No Known Allergies Medication Medication: Route: Strength: Form: doxycycline hyclate oral 100 mg tablet Class: PERIODONTAL COLLAGENASE  INHIBITORS Dose: Frequency / Time: Indication: 1 1 tablet oral, 2 times per day Number of Refills: Number of Units: 0 Generic Substitution: Start Date: End Date: Administered at Substitution Permitted [id number] Facility: No Note to Pharmacy: Signature(s): Date(s): Electronic Signature(s) Signed: 11/17/2019 5:39:56 PM By: Carlene Coria RN Signed: 11/17/2019 5:47:38 PM By: Linton Ham MD Entered By: Carlene Coria on 11/17/2019 12:05:25 --------------------------------------------------------------------------------  Problem List Details Patient Name: Date of Service: Tony [name] 11/17/2019 10:30 AM Medical Record [id number] Patient Account Number: [id number] Date of Birth/Sex: Treating RN: [birth year] (69 y.o. Tony [name]) Carlene Coria Primary Care Provider: Other Clinician: Referring Provider: Treating Provider/Extender:Tavone Caesar, Esperanza Richters in Treatment: 5 Active Problems ICD-10 Evaluated Encounter Code Description Active Date Today Diagnosis L89.614 Pressure ulcer of right heel, stage 4 10/12/2019 No Yes E11.621 Type 2 diabetes mellitus with foot ulcer 10/12/2019 No Yes L03.115 Cellulitis of right lower limb 11/03/2019 No Yes Inactive Problems Resolved Problems Electronic Signature(s) Signed: 11/17/2019 5:47:38 PM By: Linton Ham MD Entered By: Linton Ham on 11/17/2019 13:03:07 -------------------------------------------------------------------------------- Progress Note Details Patient Name: Date of Service: Tony [name] 11/17/2019 10:30 AM Medical Record [id number] Patient Account Number: [id number] Date of Birth/Sex: Treating RN: [birth year] (69 y.o. Tony [name]) Carlene Coria Primary Care Provider: Other Clinician: Referring Provider: Treating Provider/Extender:Karthik Whittinghill, Esperanza Richters in Treatment: 5 Subjective History of Present Illness (HPI) ADMISSION 10/12/2019 This is a 69 year old man who has some degree of dementia. Per discussion with his brother  apparently he was functional living somewhere outside of [place] until November. He became acutely ill and required admission to hospital from 11/13 through 06/27/2019. He was treated for prostatitis with urosepsis and urinary retention. He required readmission to hospital from 12/11 through 12/15 for a TURP. At some point during this time he was discovered to have a ulcer on his heel although I do not see anything about it in [facility] link. His brother is not aware exactly when this happened. However he is here for our review of a small but deep wound on the Achilles aspect of his right heel. We are not clear what [facility] is currently dressing this with as  we did not get any notes. They apparently did not know that he had an appointment with [place] today. Past medical history includes seizure disorder, hypertension, dementia, hyperprolactinemia, chronic renal failure stage IIIb. Apparently a recent diagnosis of type 2 diabetes although is not on any treatment. ABIs in our clinic were noncompressible on the right 3/23; deep pressure injury on the Achilles aspect of the right heel. X-ray we did of the area was negative. We have been using silver collagen 4/6; deep injury on the Achilles aspect of the right heel just above the plantar surface. Previous x-ray was negative we have been using silver collagen. Arrives in clinic today with erythema and tenderness around the wound. We really do not have any information of duration of this. He is at [facility] under the pace program 4/20; 2-week follow-up. Culture I did last time showed abundant MRSA according to my note I put him on empiric doxycycline although I cannot find the culture results for communication with [facility]. He has had [facility]. We have been using silver collagen on the wound. Objective Constitutional Patient is hypertensive.. Pulse regular and within target range for patient.Marland Kitchen Respirations regular, non-labored and within target  range.. Temperature is normal and within the target range for the patient.Marland Kitchen Appears in no distress. Vitals Time Taken: 11:05 AM, Height: 71 in, Weight: 220 lbs, BMI: 30.7, Temperature: 98.0 F, Pulse: 71 bpm, Respiratory Rate: 18 breaths/min, Blood Pressure: 156/77 mmHg. Cardiovascular Pedal pulses are palpable on the right. General Notes: Wound exam; the wound is contracted somewhat but it still has considerable relative depth. There is less tenderness around the wound. There is palpable bone here. The soft tissue around does not look to be infected Integumentary (Hair, Skin) Wound #1 status is Open. Original cause of wound was Pressure Injury. The wound is located on the Right Calcaneus. The wound measures 0.4cm length x 1.2cm width x 0.8cm depth; 0.377cm^2 area and 0.302cm^3 volume. There is Fat Layer (Subcutaneous Tissue) Exposed exposed. There is no tunneling or undermining noted. There is a medium amount of serous drainage noted. The wound margin is thickened. There is large (67-100%) pink, pale granulation within the wound bed. There is a small (1-33%) amount of necrotic tissue within the wound bed including Adherent Slough. Assessment Active Problems ICD-10 Pressure ulcer of right heel, stage 4 Type 2 diabetes mellitus with foot ulcer Cellulitis of right lower limb Plan Follow-up Appointments: Return Appointment in 2 weeks. Dressing Change Frequency: Wound #1 Right Calcaneus: Change dressing every day. Wound Cleansing: Wound #1 Right Calcaneus: Clean wound with Normal Saline. - or wound cleanser Primary Wound Dressing: Wound #1 Right Calcaneus: Calcium Alginate with Silver Secondary Dressing: Wound #1 Right Calcaneus: Kerlix/Rolled Gauze Dry Gauze Heel Cup Off-Loading: Turn and reposition every 2 hours Other: - float heels off of chair/bed with pillow under calves Home Health: [facility] for wound care. - [facility] The following medication(s)  was prescribed: doxycycline hyclate oral 100 mg tablet 1 1 tablet oral, 2 times per day starting 11/17/2019 1. Change the primary dressing to silver alginate 2. In view of the difficulties with the culture result from 4/6 I have represcribed doxycycline 100 twice daily for 10 days. We have previously had a plain x-ray of the heel that was negative. I cannot find the exact proof that we treated this adequately the last time although my note did say I gave him an empiric doxycycline 3. Hopefully they are adequately offloading this at [facility]  Signature(s) Signed: 11/17/2019 5:47:38 PM By: Linton Ham MD Entered By: Linton Ham on 11/17/2019 13:07:29 -------------------------------------------------------------------------------- SuperBill Details Patient Name: Date of Service: Tony [name] 11/17/2019 Medical Record [id number] Patient Account Number: [id number] Date of Birth/Sex: Treating RN: May 30, 1951 (69 y.o. Tony [name]) Carlene Coria Primary Care Provider: Other Clinician: Referring Provider: Treating Provider/Extender:Beya Tipps, Esperanza Richters in Treatment: 5 Diagnosis Coding ICD-10 Codes Code Description L89.614 Pressure ulcer of right heel, stage 4 E11.621 Type 2 diabetes mellitus with foot ulcer L03.115 Cellulitis of right lower limb Facility Procedures CPT4 Code: [id number] Description: [id number] - WOUND CARE VISIT-LEV 3 EST PT Modifier: Quantity: 1 Physician Procedures Electronic Signature(s) Signed: 11/17/2019 5:47:38 PM By: Linton Ham MD Entered By: Linton Ham on 11/17/2019 13:07:47

## 2019-11-17 NOTE — Progress Notes (Signed)
Tony [name], Tony [name] ([id number]) Visit Report for 11/17/2019 Arrival Information Details Patient Name: Date of Service: Tony [name], Tony [name] 11/17/2019 10:30 AM Medical Record [id number] Patient Account Number: [id number] Date of Birth/Sex: Treating RN: 02-Aug-1950 (69 y.o. Tony [name] Primary Care Tony [name]: Other Clinician: Referring Tony [name]: Treating Tony [name] Visit Information History Since Last Visit Added or deleted any medications: No Patient Arrived: Wheel Chair Any new allergies or adverse reactions: No Arrival Time: 11:05 Had a fall or experienced change in No Accompanied By: self activities of daily living that may affect Transfer Assistance: EasyPivot risk of falls: Patient Lift Signs or symptoms of abuse/neglect since last No Patient Identification Verified: Yes visito Secondary Verification Process Yes Hospitalized since last visit: No Completed: Implantable device outside of the clinic excluding No Patient Requires Transmission-Based No cellular tissue based products placed in the center Precautions: since last visit: Patient Has Alerts: No Has Dressing in Place as Prescribed: Yes Pain Present Now: No Electronic Signature(s) Signed: 11/17/2019 6:13:22 PM By: Baruch Gouty RN, BSN Entered By: Baruch Gouty on 11/17/2019 11:08:18 -------------------------------------------------------------------------------- Clinic Level of Care Assessment Details Patient Name: Date of Service: Tony [name] 11/17/2019 10:30 AM Medical Record [id number] Patient Account Number: [id number] Date of Birth/Sex: Treating RN: 06-09-1951 (69 y.o. Tony [name]) Carlene Coria Primary Care Tony [name]: Other Clinician: Referring Tony [name]: Treating [name]/Extender:Tony [name] in Treatment: [name] Clinic Level of Care Assessment Items TOOL 4 Quantity Score X - Use when only an EandM is performed on FOLLOW-UP visit 1 0 ASSESSMENTS -  Nursing Assessment / Reassessment X - Reassessment of Co-morbidities (includes updates in patient status) 1 10 X - Reassessment of Adherence to Treatment Plan 1 [name] ASSESSMENTS - Wound and Skin Assessment / Reassessment X - Simple Wound Assessment / Reassessment - one wound 1 [name] []  - Complex Wound Assessment / Reassessment - multiple wounds 0 []  - Dermatologic / Skin Assessment (not related to wound area) 0 ASSESSMENTS - Focused Assessment []  - Circumferential Edema Measurements - multi extremities 0 []  - Nutritional Assessment / Counseling / Intervention 0 []  - Lower Extremity Assessment (monofilament, tuning fork, pulses) 0 []  - Peripheral Arterial Disease Assessment (using hand held doppler) 0 ASSESSMENTS - Ostomy and/or Continence Assessment and Care []  - Incontinence Assessment and Management 0 []  - Ostomy Care Assessment and Management (repouching, etc.) 0 PROCESS - Coordination of Care X - Simple Patient / Family Education for ongoing care 1 15 []  - Complex (extensive) Patient / Family Education for ongoing care 0 X - Staff obtains Programmer, systems, Records, Test Results / Process Orders 1 10 []  - Staff telephones HHA, Nursing Homes / Clarify orders / etc 0 []  - Routine Transfer to another Facility (non-emergent condition) 0 []  - Routine Hospital Admission (non-emergent condition) 0 []  - New Admissions / Biomedical engineer / Ordering NPWT, Apligraf, etc. 0 []  - Emergency Hospital Admission (emergent condition) 0 X - Simple Discharge Coordination 1 10 []  - Complex (extensive) Discharge Coordination 0 PROCESS - Special Needs []  - Pediatric / Minor Patient Management 0 []  - Isolation Patient Management 0 []  - Hearing / Language / Visual special needs 0 []  - Assessment of Community assistance (transportation, D/C planning, etc.) 0 []  - Additional assistance / Altered mentation 0 []  - Support Surface(s) Assessment (bed, cushion, seat, etc.) 0 INTERVENTIONS - Wound Cleansing /  Measurement X - Simple Wound Cleansing - one wound 1 [name] []  - Complex Wound Cleansing - multiple wounds 0 X - Wound Imaging (photographs - any number of  wounds) 1 [name] []  - Wound Tracing (instead of photographs) 0 X - Simple Wound Measurement - one wound 1 [name] []  - Complex Wound Measurement - multiple wounds 0 INTERVENTIONS - Wound Dressings []  - Small Wound Dressing one or multiple wounds 0 X - Medium Wound Dressing one or multiple wounds 1 15 []  - Large Wound Dressing one or multiple wounds 0 X - Application of Medications - topical 1 [name] []  - Application of Medications - injection 0 INTERVENTIONS - Miscellaneous []  - External ear exam 0 []  - Specimen Collection (cultures, biopsies, blood, body fluids, etc.) 0 []  - Specimen(s) / Culture(s) sent or taken to Lab for analysis 0 []  - Patient Transfer (multiple staff / Civil Service fast streamer / Similar devices) 0 []  - Simple Staple / Suture removal (25 or less) 0 []  - Complex Staple / Suture removal (26 or more) 0 []  - Hypo / Hyperglycemic Management (close monitor of Blood Glucose) 0 []  - Ankle / Brachial Index (ABI) - do not check if billed separately 0 X - Vital Signs 1 [name] Has the patient been seen at the hospital within the last three years: Yes Total Score: 95 Level Of Care: New/Established - Level 3 Electronic Signature(s) Signed: 11/17/2019 [name]:39:56 PM By: Carlene Coria RN Entered By: Carlene Coria on 11/17/2019 12:41:57 -------------------------------------------------------------------------------- Encounter Discharge Information Details Patient Name: Date of Service: Tony [name] 11/17/2019 10:30 AM Medical Record [id number] Patient Account Number: [id number] Date of Birth/Sex: Treating RN: [birth year] (69 y.o. Tony [name] Primary Care Tony [name]: Other Clinician: Referring Tony [name]: Treating Tony [name] in Treatment: [name] Encounter Discharge Information Items Discharge Condition: Stable Ambulatory Status:  Wheelchair Discharge Destination: [place] Telephoned: No Orders Sent: Yes Transportation: Other Accompanied By: self Schedule Follow-up Yes Appointment: Clinical Summary of Care: Patient Declined Electronic Signature(s) Signed: 11/17/2019 [name]:57:12 PM By: Kela Millin Entered By: Kela Millin on 11/17/2019 12:11:43 -------------------------------------------------------------------------------- Lower Extremity Assessment Details Patient Name: Date of Service: Tony [name], Tony [name] 11/17/2019 10:30 AM Medical Record [id number] Patient Account Number: [id number] Date of Birth/Sex: Treating RN: [birth year] (69 y.o. Tony [name] Primary Care Airianna Kreischer: Other Clinician: Referring Jeyli Zwicker: Treating Carlous Olivares/Extender:Tony [name] in Treatment: [name] Edema Assessment Assessed: [Left: No] [Right: No] Edema: [Left: N] [Right: o] Calf Left: Right: Point of Measurement: 42 cm From Medial Instep cm 35 cm Ankle Left: Right: Point of Measurement: 10 cm From Medial Instep cm 20.[name] cm Vascular Assessment Pulses: Dorsalis Pedis Palpable: [Right:Yes] Electronic Signature(s) Signed: 11/17/2019 [name]:57:12 PM By: Kela Millin Entered By: Kela Millin on 11/17/2019 11:13:14 -------------------------------------------------------------------------------- Multi-Disciplinary Care Plan Details Patient Name: Date of Service: Tony [name] 11/17/2019 10:30 AM Medical Record [id number] Patient Account Number: [id number] Date of Birth/Sex: Treating RN: Mar 05, 1951 (69 y.o. Tony [name]) Carlene Coria Primary Care Kynzi Levay: Other Clinician: Referring Glena Pharris: Treating Hilari Wethington/Extender:Tony [name] in Treatment: [name] Active Inactive Wound/Skin Impairment Nursing Diagnoses: Impaired tissue integrity Knowledge deficit related to ulceration/compromised skin integrity Goals: Patient/caregiver will verbalize understanding of skin care regimen Date Initiated:  10/12/2019 Target Resolution Date: [name]/05/2020 Goal Status: Active Ulcer/skin breakdown will have a volume reduction of 30% by week 4 Date Initiated: 10/12/2019 Target Resolution Date: 11/06/2019 Goal Status: Active Interventions: Assess patient/caregiver ability to obtain necessary supplies Assess patient/caregiver ability to perform ulcer/skin care regimen upon admission and as needed Assess ulceration(s) every visit Provide education on ulcer and skin care Notes: Electronic Signature(s) Signed: 11/17/2019 [name]:39:56 PM By: Carlene Coria RN Entered By: Carlene Coria on 11/17/2019 11:01:07 -------------------------------------------------------------------------------- Pain Assessment Details Patient Name: Date of Service:  Tony [name], Tony [name] 11/17/2019 10:30 AM Medical Record [id number] Patient Account Number: [id number] Date of Birth/Sex: Treating RN: November 19, 1950 (69 y.o. Tony [name] Primary Care Mariene Dickerman: Other Clinician: Referring Znya Albino: Treating Littie Chiem/Extender:Tony [name] in Treatment: [name] Active Problems Location of Pain Severity and Description of Pain Patient Has Paino No Site Locations With Dressing Change: Yes Duration of the Pain. Constant / Intermittento Intermittent Rate the pain. Current Pain Level: 0 Worst Pain Level: 3 Least Pain Level: 0 Character of Pain Describe the Pain: Tender Pain Management and Medication Current Pain Management: Electronic Signature(s) Signed: 11/17/2019 6:13:22 PM By: Baruch Gouty RN, BSN Entered By: Baruch Gouty on 11/17/2019 11:10:26 -------------------------------------------------------------------------------- Patient/Caregiver Education Details Patient Name: Tony [name] 4/20/2021andnbsp10:30 Date of Service: AM Medical Record [id number] Number: Patient Account Number: [id number] Treating RN: 1950/10/13 (69 y.o. Carlene Coria Date of Birth/Gender: M) Other Clinician: Primary Care Physician:  Treating Linton Ham Referring Physician: Physician/Extender: Suella Grove in Treatment: [name] Education Assessment Education Provided To: Patient Education Topics Provided Wound/Skin Impairment: Methods: Explain/Verbal Responses: State content correctly Electronic Signature(s) Signed: 11/17/2019 [name]:39:56 PM By: Carlene Coria RN Entered By: Carlene Coria on 11/17/2019 11:01:24 -------------------------------------------------------------------------------- Wound Assessment Details Patient Name: Date of Service: Tony [name], Tony [name] 11/17/2019 10:30 AM Medical Record [id number] Patient Account Number: [id number] Date of Birth/Sex: Treating RN: 01-26-1951 (69 y.o. Tony [name] Primary Care Preeti Winegardner: Other Clinician: Referring Kevontay Burks: Treating Marissia Blackham/Extender:Tony [name] in Treatment: [name] Wound Status Wound Number: 1 Primary Pressure Ulcer Etiology: Wound Location: Right Calcaneus Wound Open Wounding Event: Pressure Injury Status: Date Acquired: 08/31/2019 Comorbid Hypertension, Type II Diabetes, Dementia, Weeks Of Treatment: [name] History: Seizure Disorder Clustered Wound: No Wound Measurements Length: (cm) 0.4 Width: (cm) 1.2 Depth: (cm) 0.8 Area: (cm) 0.377 Volume: (cm) 0.302 Wound Description Classification: Category/Stage IV Wound Margin: Thickened Exudate Amount: Medium Exudate Type: Serous Exudate Color: amber Wound Bed Granulation Amount: Large (67-100%) Granulation Quality: Pink, Pale Necrotic Amount: Small (1-33%) Necrotic Quality: Adherent Slough After Cleansing: No brino Yes Exposed Structure osed: No (Subcutaneous Tissue) Exposed: Yes osed: No sed: No ed: No d: No % Reduction in Area: 70% % Reduction in Volume: 85% Epithelialization: Small (1-33%) Tunneling: No Undermining: No Foul Odor Slough/Fi Fascia Exp Fat Layer Tendon Exp Muscle Expo Joint Expos Bone Expose Treatment Notes Wound #1 (Right Calcaneus) 1. Cleanse  With Wound Cleanser 2. Periwound Care Skin Prep 3. Primary Dressing Applied Calcium Alginate Ag 4. Secondary Dressing Dry Gauze Roll Gauze Heel Cup [name]. Secured With Recruitment consultant) Signed: 11/17/2019 [name]:57:12 PM By: Kela Millin Entered By: Kela Millin on 11/17/2019 11:14:42 -------------------------------------------------------------------------------- Vitals Details Patient Name: Date of Service: Tony [name] 11/17/2019 10:30 AM Medical Record [id number] Patient Account Number: [id number] Date of Birth/Sex: Treating RN: 05-24-1951 (69 y.o. Tony [name] Primary Care Tavon Magnussen: Other Clinician: Referring Taquilla Downum: Treating Masa Lubin/Extender:Tony [name] in Treatment: [name] Vital Signs Time Taken: 11:05 Temperature (F): 98.0 Height (in): 71 Pulse (bpm): 71 Weight (lbs): 220 Respiratory Rate (breaths/min): 18 Body Mass Index (BMI): 30.7 Blood Pressure (mmHg): 156/77 Reference Range: 80 - 120 mg / dl Electronic Signature(s) Signed: 11/17/2019 6:13:22 PM By: Baruch Gouty RN, BSN Entered By: Baruch Gouty on 11/17/2019 11:09:07

## 2019-11-30 IMAGING — CT CT CHEST W/O CM
2 of 4 series · 15 of 36 positions shown, 18 images · non-contrast
Comparison: None.

CLINICAL DATA: Per EMS patient from home. Family states patient had
seizure and was altered when he came to. Patient brother called and
stated that patient has history of seizures and was postictal.
Abnormal cxrPneumonia, unresolved or complicated

EXAM:
CT CHEST WITHOUT CONTRAST
TECHNIQUE: Multidetector CT imaging of the chest was performed following the
standard protocol without IV contrast.

[Series 2: routine chest without · axial · non-contrast · 0.66mm/px · z∈[-206,+36]mm · 12 of 143 slices shown, 15 images]
[im 11/143  mediastinal]
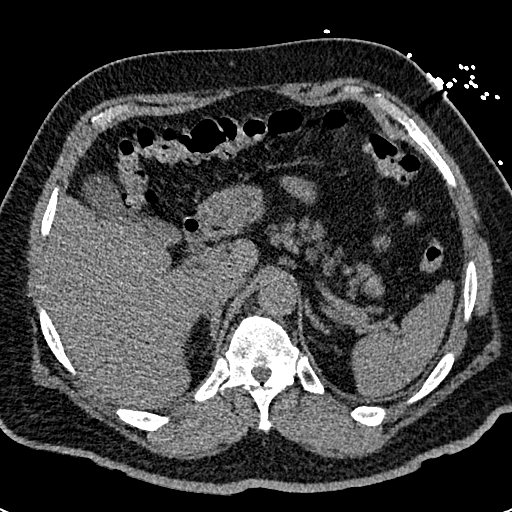
[im 11/143  lung]
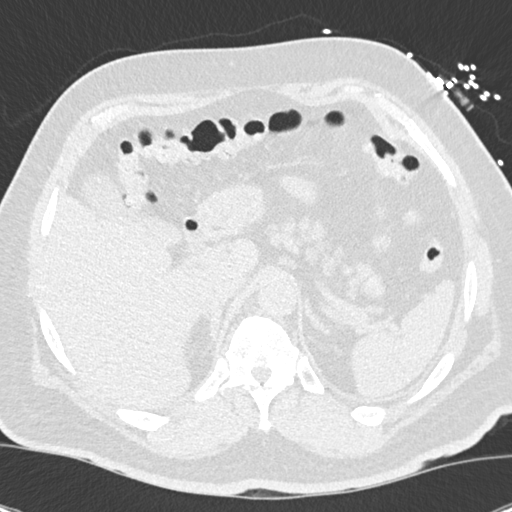
[im 22/143  lung]
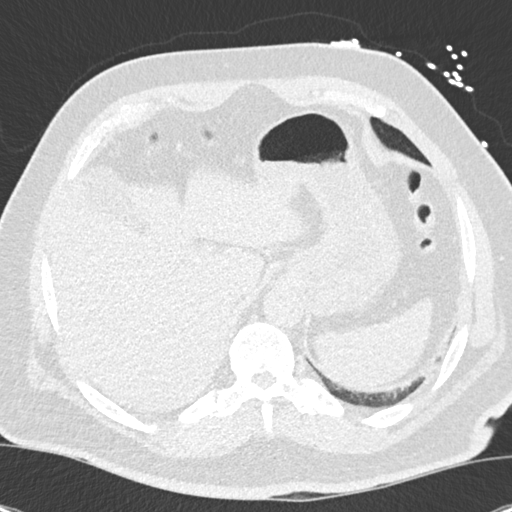
[im 33/143  lung]
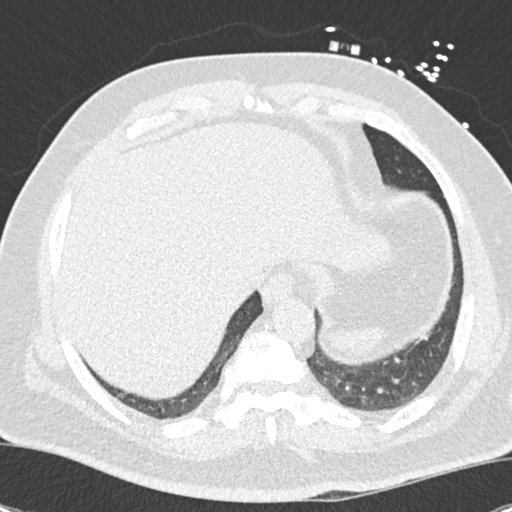
[im 44/143  lung]
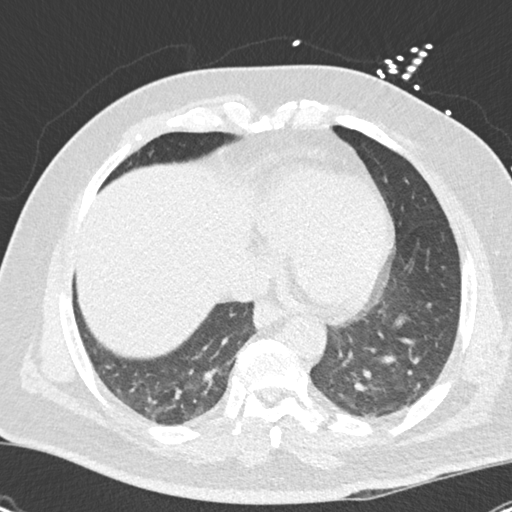
[im 55/143  mediastinal]
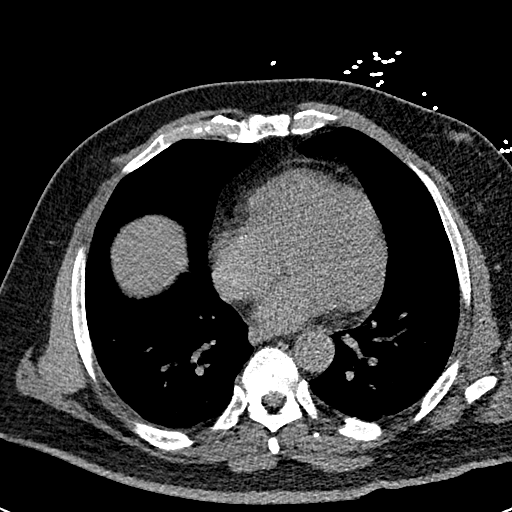
[im 55/143  lung]
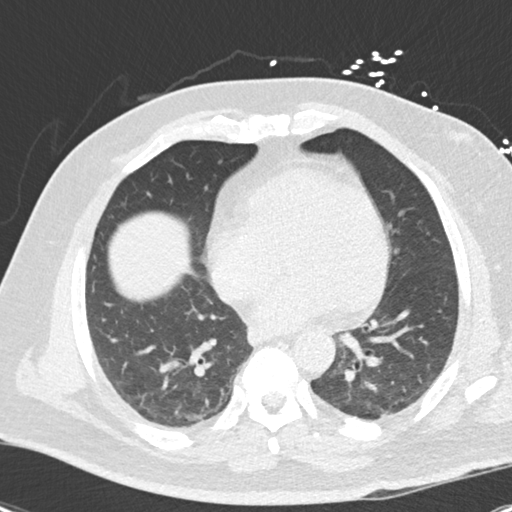
[im 66/143  lung]
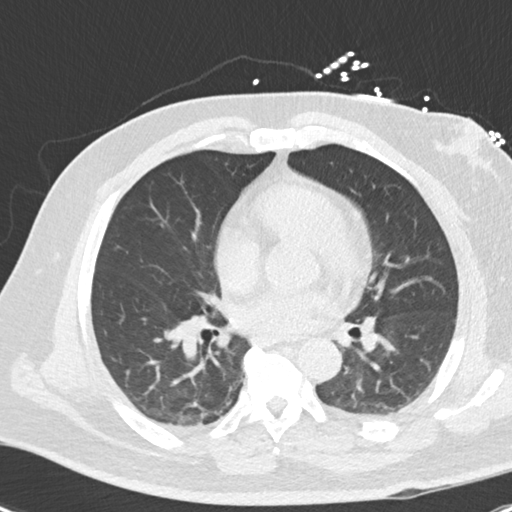
[im 77/143  lung]
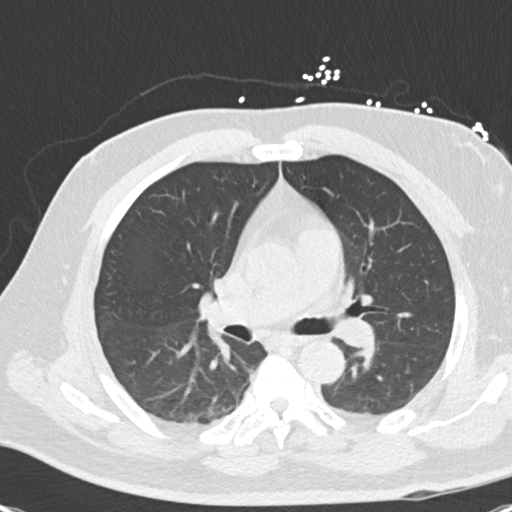
[im 88/143  lung]
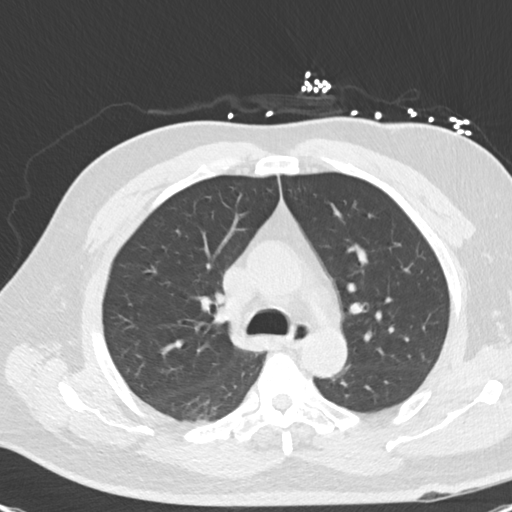
[im 99/143  mediastinal]
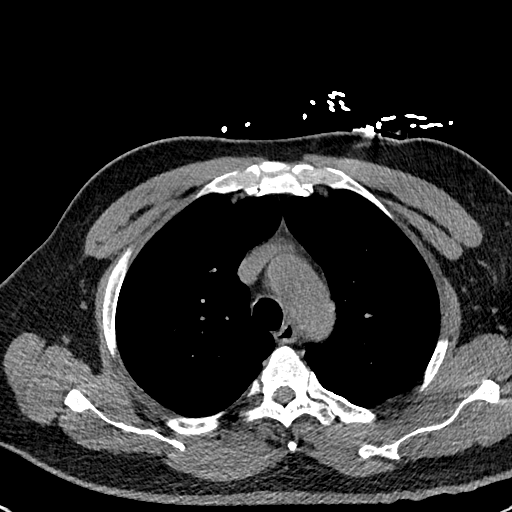
[im 99/143  lung]
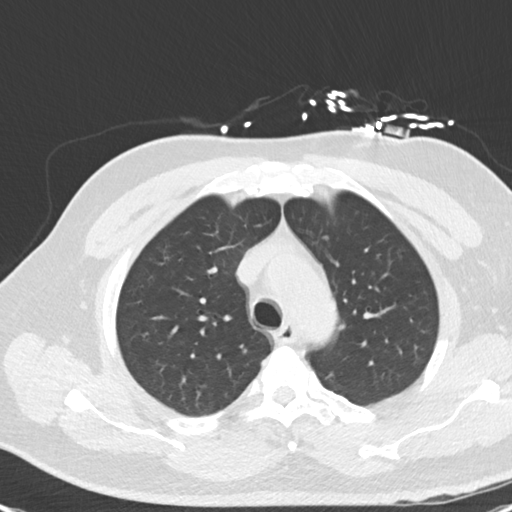
[im 110/143  lung]
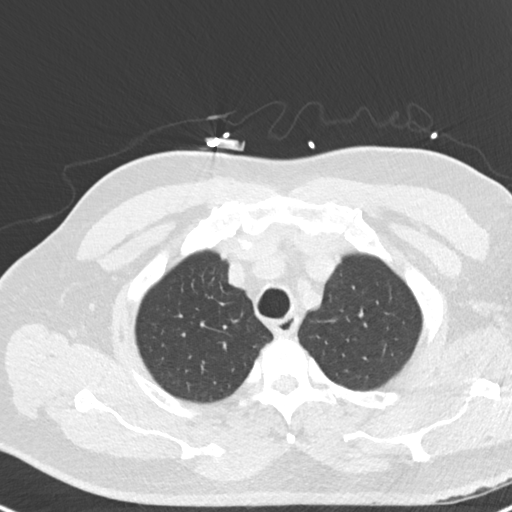
[im 121/143  lung]
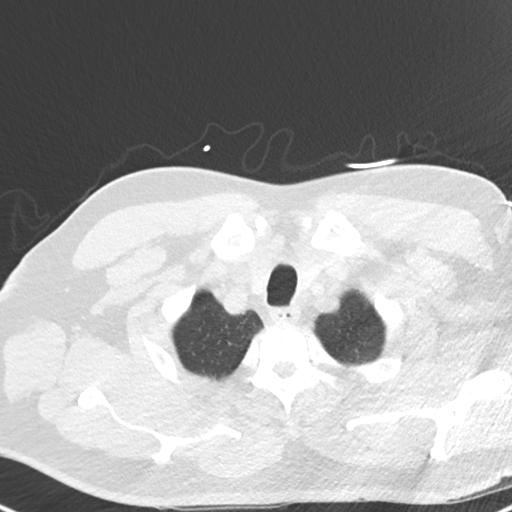
[im 132/143  lung]
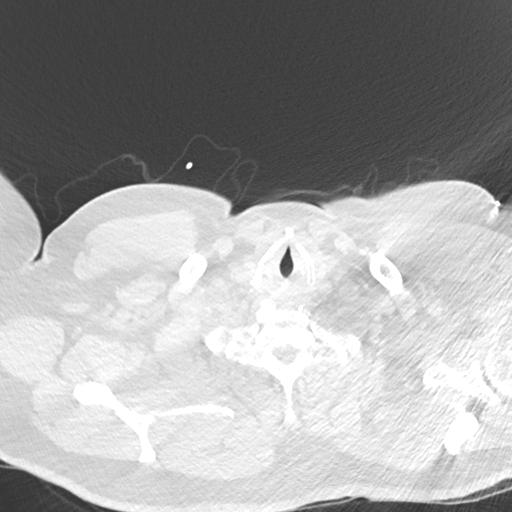

[Series 5: coronal · coronal · 0.62mm/px · 3 of 151 slices shown]
[im 31/151  lung]
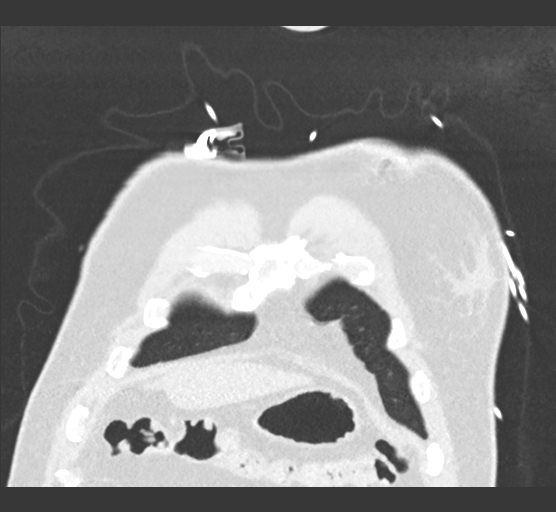
[im 61/151  lung]
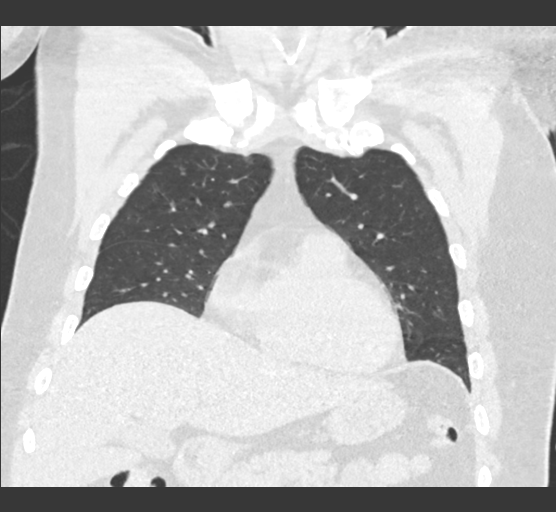
[im 91/151  lung]
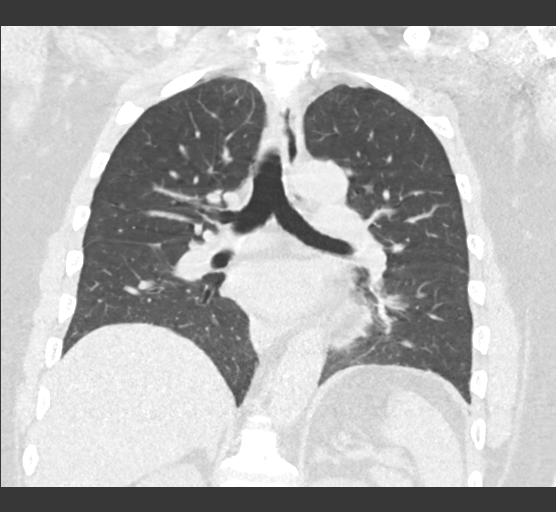

[15 of 36 positions shown; findings below may reference images not displayed]

FINDINGS: Cardiovascular: No significant vascular findings. Normal heart size.
No pericardial effusion.

Mediastinum/Nodes: No axillary supraclavicular adenopathy. No
mediastinal hilar adenopathy no pericardial effusion. Esophagus
normal.

Lungs/Pleura: Mild basilar atelectasis. No infiltrate. Suspicious
nodularity. Airways normal.

Upper Abdomen: Limited view of the liver, kidneys, pancreas are
unremarkable. Normal adrenal glands.

Musculoskeletal: No aggressive osseous lesion.
IMPRESSION: 1. No acute cardiopulmonary findings.
2. No aspiration or pneumonia.
3. No fracture.

## 2019-12-01 ENCOUNTER — Encounter (HOSPITAL_BASED_OUTPATIENT_CLINIC_OR_DEPARTMENT_OTHER): Payer: Medicare (Managed Care) | Attending: Internal Medicine | Admitting: Internal Medicine

## 2019-12-01 DIAGNOSIS — L03115 Cellulitis of right lower limb: Secondary | ICD-10-CM | POA: Diagnosis not present

## 2019-12-01 DIAGNOSIS — N1832 Chronic kidney disease, stage 3b: Secondary | ICD-10-CM | POA: Insufficient documentation

## 2019-12-01 DIAGNOSIS — E1122 Type 2 diabetes mellitus with diabetic chronic kidney disease: Secondary | ICD-10-CM | POA: Diagnosis not present

## 2019-12-01 DIAGNOSIS — L89614 Pressure ulcer of right heel, stage 4: Secondary | ICD-10-CM | POA: Insufficient documentation

## 2019-12-01 DIAGNOSIS — E11621 Type 2 diabetes mellitus with foot ulcer: Secondary | ICD-10-CM | POA: Diagnosis present

## 2019-12-01 DIAGNOSIS — F0390 Unspecified dementia without behavioral disturbance: Secondary | ICD-10-CM | POA: Insufficient documentation

## 2019-12-01 DIAGNOSIS — G40909 Epilepsy, unspecified, not intractable, without status epilepticus: Secondary | ICD-10-CM | POA: Diagnosis not present

## 2019-12-01 DIAGNOSIS — I129 Hypertensive chronic kidney disease with stage 1 through stage 4 chronic kidney disease, or unspecified chronic kidney disease: Secondary | ICD-10-CM | POA: Diagnosis not present

## 2019-12-02 NOTE — Progress Notes (Signed)
Tony [name], Tony [name] ([id number]) Visit Report for 12/01/2019 HPI Details Patient Name: Date of Service: Tony [name] 12/01/2019 10:30 A M Medical Record Number: [id number] Patient Account Number: [id number] Date of Birth/Sex: Treating RN: 09-Feb-1951 (69 y.o. Tony [name]) Tony [name] Primary Care Provider: Other Clinician: Referring Provider: Treating Provider/Extender: Tony [name] in Treatment: 7 History of Present Illness HPI Description: ADMISSION 10/12/2019 This is a 69 year old man who has some degree of dementia. Per discussion with his brother apparently he was functional living somewhere outside of [place] until November. He became acutely ill and required admission to hospital from 11/13 through 06/27/2019. He was treated for prostatitis with urosepsis and urinary retention. He required readmission to hospital from 12/11 through 12/15 for a TURP. At some point during this time he was discovered to have a ulcer on his heel although I do not see anything about it in [place] link. His brother is not aware exactly when this happened. However he is here for our review of a small but deep wound on the Achilles aspect of his right heel. We are not clear what Tony [name] is currently dressing this with as we did not get any notes. They apparently did not know that he had an appointment with [place] today. Past medical history includes seizure disorder, hypertension, dementia, hyperprolactinemia, chronic renal failure stage IIIb. Apparently a recent diagnosis of type 2 diabetes although is not on any treatment. ABIs in our clinic were noncompressible on the right 3/23; deep pressure injury on the Achilles aspect of the right heel. X-ray we did of the area was negative. We have been using silver collagen 4/6; deep injury on the Achilles aspect of the right heel just above the plantar surface. Previous x-ray was negative we have been using silver collagen. Arrives in clinic today with erythema and  tenderness around the wound. We really do not have any information of duration of this. He is at [facility] under the Tony [name] program 4/20; 2-week follow-up. Culture I did last time showed abundant MRSA according to my note I put him on empiric doxycycline although I cannot find the culture results for communication with Tony [name]. He has had Tony [name]. We have been using silver collagen on the wound. 5/4; 2-week follow-up miraculously this area has closed over. He completed the antibiotics I gave him as far as I know. He is at [facility] under the care of the Tony [name] program. Engineer, maintenance) Signed: 12/01/2019 5:25:34 PM By: Tony Ham MD Entered By: Tony [name] on 12/01/2019 12:26:37 -------------------------------------------------------------------------------- Physical Exam Details Patient Name: Date of Service: Tony [name], Tony Y R. 12/01/2019 10:30 A M Medical Record Number: [id number] Patient Account Number: [id number] Date of Birth/Sex: Treating RN: 08/18/1950 (69 y.o. Tony [name]) Tony [name] Primary Care Provider: Other Clinician: Referring Provider: Treating Provider/Extender: Tony [name] in Treatment: 7 Constitutional Sitting or standing Blood Pressure is within target range for patient.. Pulse regular and within target range for patient.Marland Kitchen Respirations regular, non-labored and within target range.. Temperature is normal and within the target range for the patient.Marland Kitchen Appears in no distress. Notes Wound exam; the wound has a epithelialized surface. I found myself really quite surprised by this. Although this was smaller last time it still had considerable relative depth and I pleasantly this surprised to see this close. There is no surrounding erythema Electronic Signature(s) Signed: 12/01/2019 5:25:34 PM By: Tony Ham MD Entered By: Tony [name] on 12/01/2019 12:27:34 -------------------------------------------------------------------------------- Physician Orders  Details Patient Name: Date of Service: Tony [name]  Tony [name] 12/01/2019 10:30 A M Medical Record Number: [id number] Patient Account Number: [id number] Date of Birth/Sex: Treating RN: [birth year] (69 y.o. Tony [name]) Tony [name] Primary Care Provider: Other Clinician: Referring Provider: Treating Provider/Extender: Tony [name] in Treatment: 7 Verbal / Phone Orders: No Diagnosis Coding ICD-10 Coding Code Description L89.614 Pressure ulcer of right heel, stage 4 E11.621 Type 2 diabetes mellitus with foot ulcer L03.115 Cellulitis of right lower limb Discharge From [facility] Services Discharge from [place] - heel protectors Off-Loading Turn and reposition every 2 hours Other: - float heels off of chair/bed with pillow under calves [place] [facility] skilled nursing for wound care. - Tony [name] Electronic Signature(s) Signed: 12/01/2019 5:25:34 PM By: Tony Ham MD Signed: 12/02/2019 4:37:47 PM By: Tony Coria RN Entered By: Tony [name] on 12/01/2019 11:30:59 -------------------------------------------------------------------------------- Problem List Details Patient Name: Date of Service: Tony [name], Tony Y R. 12/01/2019 10:30 A M Medical Record Number: [id number] Patient Account Number: [id number] Date of Birth/Sex: Treating RN: [birth year] (69 y.o. Tony [name]) Tony [name] Primary Care Provider: Other Clinician: Referring Provider: Treating Provider/Extender: Tony [name] in Treatment: 7 Active Problems ICD-10 Encounter Code Description Active Date MDM Diagnosis L89.614 Pressure ulcer of right heel, stage 4 10/12/2019 No Yes E11.621 Type 2 diabetes mellitus with foot ulcer 10/12/2019 No Yes L03.115 Cellulitis of right lower limb 11/03/2019 No Yes Inactive Problems Resolved Problems Electronic Signature(s) Signed: 12/01/2019 5:25:34 PM By: Tony Ham MD Entered By: Tony [name] on 12/01/2019  12:25:46 -------------------------------------------------------------------------------- Progress Note Details Patient Name: Date of Service: Tony [name], Tony Y R. 12/01/2019 10:30 A M Medical Record Number: [id number] Patient Account Number: [id number] Date of Birth/Sex: Treating RN: [birth year] (69 y.o. Tony [name]) Tony [name] Primary Care Provider: Other Clinician: Referring Provider: Treating Provider/Extender: Tony [name] in Treatment: 7 Subjective History of Present Illness (HPI) ADMISSION 10/12/2019 This is a 69 year old man who has some degree of dementia. Per discussion with his brother apparently he was functional living somewhere outside of [place] until November. He became acutely ill and required admission to hospital from 11/13 through 06/27/2019. He was treated for prostatitis with urosepsis and urinary retention. He required readmission to hospital from 12/11 through 12/15 for a TURP. At some point during this time he was discovered to have a ulcer on his heel although I do not see anything about it in [place] link. His brother is not aware exactly when this happened. However he is here for our review of a small but deep wound on the Achilles aspect of his right heel. We are not clear what Tony [name] is currently dressing this with as we did not get any notes. They apparently did not know that he had an appointment with [place] today. Past medical history includes seizure disorder, hypertension, dementia, hyperprolactinemia, chronic renal failure stage IIIb. Apparently a recent diagnosis of type 2 diabetes although is not on any treatment. ABIs in our clinic were noncompressible on the right 3/23; deep pressure injury on the Achilles aspect of the right heel. X-ray we did of the area was negative. We have been using silver collagen 4/6; deep injury on the Achilles aspect of the right heel just above the plantar surface. Previous x-ray was negative we have been using silver  collagen. Arrives in clinic today with erythema and tenderness around the wound. We really do not have any information of duration of this. He is at [facility] under the Tony [name] program 4/20; 2-week follow-up. Culture I did last time showed abundant  MRSA according to my note I put him on empiric doxycycline although I cannot find the culture results for communication with Tony [name]. He has had Tony [name]. We have been using silver collagen on the wound. 5/4; 2-week follow-up miraculously this area has closed over. He completed the antibiotics I gave him as far as I know. He is at [facility] under the care of the Tony [name] program. Objective Constitutional Sitting or standing Blood Pressure is within target range for patient.. Pulse regular and within target range for patient.Marland Kitchen Respirations regular, non-labored and within target range.. Temperature is normal and within the target range for the patient.Marland Kitchen Appears in no distress. Vitals Time Taken: 10:48 AM, Height: 71 in, Weight: 220 lbs, BMI: 30.7, Temperature: 97.7 F, Pulse: 69 bpm, Respiratory Rate: 18 breaths/min, Blood Pressure: 115/62 mmHg. General Notes: Wound exam; the wound has a epithelialized surface. I found myself really quite surprised by this. Although this was smaller last time it still had considerable relative depth and I pleasantly this surprised to see this close. There is no surrounding erythema Integumentary (Hair, Skin) Wound #1 status is Open. Original cause of wound was Pressure Injury. The wound is located on the Right Calcaneus. The wound measures 0cm length x 0cm width x 0cm depth; 0cm^2 area and 0cm^3 volume. There is no tunneling or undermining noted. There is a none present amount of drainage noted. The wound margin is thickened. There is no granulation within the wound bed. There is no necrotic tissue within the wound bed. Assessment Active Problems ICD-10 Pressure ulcer of right heel, stage 4 Type 2 diabetes mellitus with  foot ulcer Cellulitis of right lower limb Plan [facility]charge From [name]: Discharge from [place] - heel protectors Off-Loading: Turn and reposition every 2 hours Other: - float heels off of chair/bed with pillow under calves Home Health: [facility] skilled nursing for wound care. - Tony [name] 1. Surprising this wound is closed over so quickly but nevertheless gratifying 2. I recommended keeping a foam cover on this and heel protectors indefinitely. 3. The patient can be discharged from the clinic Electronic Signature(s) Signed: 12/01/2019 5:25:34 PM By: Tony Ham MD Entered By: Tony [name] on 12/01/2019 12:29:17 -------------------------------------------------------------------------------- SuperBill Details Patient Name: Date of Service: Tony [name], Tony Y R. 12/01/2019 Medical Record Number: [id number] Patient Account Number: [id number] Date of Birth/Sex: Treating RN: 04-05-1951 (69 y.o. Tony [name]) Tony [name] Primary Care Provider: Other Clinician: Referring Provider: Treating Provider/Extender: Tony [name] in Treatment: 7 Diagnosis Coding ICD-10 Codes Code Description [id number] Pressure ulcer of right heel, stage 4 E11.621 Type 2 diabetes mellitus with foot ulcer L03.115 Cellulitis of right lower limb Facility Procedures CPT4 Code: [id number] Description: [phone] - WOUND CARE VISIT-LEV 2 EST PT Modifier: Quantity: 1 Physician Procedures : CPT4 Code Description Modifier [id number] - WC PHYS LEVEL 2 - EST PT ICD-10 Diagnosis Description L89.614 Pressure ulcer of right heel, stage 4 E11.621 Type 2 diabetes mellitus with foot ulcer Quantity: 1 Electronic Signature(s) Signed: 12/01/2019 5:25:34 PM By: Tony Ham MD Entered By: Tony [name] on 12/01/2019 12:29:37

## 2019-12-24 ENCOUNTER — Emergency Department (HOSPITAL_COMMUNITY): Payer: Medicare (Managed Care)

## 2019-12-24 ENCOUNTER — Encounter (HOSPITAL_COMMUNITY): Payer: Self-pay

## 2019-12-24 ENCOUNTER — Inpatient Hospital Stay (HOSPITAL_COMMUNITY)
Admission: EM | Admit: 2019-12-24 | Discharge: 2019-12-28 | DRG: 871 | Disposition: A | Discharge status: Skilled Nursing Facility | Payer: Medicare (Managed Care) | Source: Skilled Nursing Facility | Attending: Internal Medicine | Admitting: Internal Medicine

## 2019-12-24 ENCOUNTER — Other Ambulatory Visit: Payer: Self-pay

## 2019-12-24 DIAGNOSIS — F0390 Unspecified dementia without behavioral disturbance: Secondary | ICD-10-CM | POA: Diagnosis present

## 2019-12-24 DIAGNOSIS — G40909 Epilepsy, unspecified, not intractable, without status epilepticus: Secondary | ICD-10-CM | POA: Diagnosis present

## 2019-12-24 DIAGNOSIS — Z8249 Family history of ischemic heart disease and other diseases of the circulatory system: Secondary | ICD-10-CM

## 2019-12-24 DIAGNOSIS — Z79899 Other long term (current) drug therapy: Secondary | ICD-10-CM

## 2019-12-24 DIAGNOSIS — E860 Dehydration: Secondary | ICD-10-CM | POA: Diagnosis present

## 2019-12-24 DIAGNOSIS — E785 Hyperlipidemia, unspecified: Secondary | ICD-10-CM | POA: Diagnosis present

## 2019-12-24 DIAGNOSIS — A4181 Sepsis due to Enterococcus: Secondary | ICD-10-CM | POA: Diagnosis present

## 2019-12-24 DIAGNOSIS — E875 Hyperkalemia: Secondary | ICD-10-CM | POA: Diagnosis present

## 2019-12-24 DIAGNOSIS — I129 Hypertensive chronic kidney disease with stage 1 through stage 4 chronic kidney disease, or unspecified chronic kidney disease: Secondary | ICD-10-CM | POA: Diagnosis present

## 2019-12-24 DIAGNOSIS — N179 Acute kidney failure, unspecified: Secondary | ICD-10-CM | POA: Diagnosis present

## 2019-12-24 DIAGNOSIS — R5381 Other malaise: Secondary | ICD-10-CM | POA: Diagnosis not present

## 2019-12-24 DIAGNOSIS — I10 Essential (primary) hypertension: Secondary | ICD-10-CM | POA: Diagnosis present

## 2019-12-24 DIAGNOSIS — E872 Acidosis: Secondary | ICD-10-CM | POA: Diagnosis present

## 2019-12-24 DIAGNOSIS — F0280 Dementia in other diseases classified elsewhere without behavioral disturbance: Secondary | ICD-10-CM | POA: Diagnosis present

## 2019-12-24 DIAGNOSIS — R6520 Severe sepsis without septic shock: Secondary | ICD-10-CM | POA: Diagnosis present

## 2019-12-24 DIAGNOSIS — G309 Alzheimer's disease, unspecified: Secondary | ICD-10-CM | POA: Diagnosis present

## 2019-12-24 DIAGNOSIS — R4182 Altered mental status, unspecified: Secondary | ICD-10-CM

## 2019-12-24 DIAGNOSIS — N400 Enlarged prostate without lower urinary tract symptoms: Secondary | ICD-10-CM | POA: Diagnosis present

## 2019-12-24 DIAGNOSIS — A419 Sepsis, unspecified organism: Secondary | ICD-10-CM | POA: Diagnosis not present

## 2019-12-24 DIAGNOSIS — N390 Urinary tract infection, site not specified: Secondary | ICD-10-CM | POA: Diagnosis present

## 2019-12-24 DIAGNOSIS — Z833 Family history of diabetes mellitus: Secondary | ICD-10-CM

## 2019-12-24 DIAGNOSIS — E1122 Type 2 diabetes mellitus with diabetic chronic kidney disease: Secondary | ICD-10-CM | POA: Diagnosis present

## 2019-12-24 DIAGNOSIS — N189 Chronic kidney disease, unspecified: Secondary | ICD-10-CM | POA: Diagnosis present

## 2019-12-24 DIAGNOSIS — G9341 Metabolic encephalopathy: Secondary | ICD-10-CM | POA: Diagnosis present

## 2019-12-24 DIAGNOSIS — Z20822 Contact with and (suspected) exposure to covid-19: Secondary | ICD-10-CM | POA: Diagnosis present

## 2019-12-24 DIAGNOSIS — N182 Chronic kidney disease, stage 2 (mild): Secondary | ICD-10-CM | POA: Diagnosis present

## 2019-12-24 LAB — URINALYSIS, ROUTINE W REFLEX MICROSCOPIC
Bilirubin Urine: NEGATIVE
Glucose, UA: NEGATIVE mg/dL
Hgb urine dipstick: NEGATIVE
Ketones, ur: NEGATIVE mg/dL
Nitrite: NEGATIVE
Protein, ur: NEGATIVE mg/dL
Specific Gravity, Urine: 1.017 (ref 1.005–1.030)
pH: 6 (ref 5.0–8.0)

## 2019-12-24 LAB — COMPREHENSIVE METABOLIC PANEL
ALT: 13 U/L (ref 0–44)
AST: 23 U/L (ref 15–41)
Albumin: 3.7 g/dL (ref 3.5–5.0)
Alkaline Phosphatase: 64 U/L (ref 38–126)
Anion gap: 15 (ref 5–15)
BUN: 23 mg/dL (ref 8–23)
CO2: 26 mmol/L (ref 22–32)
Calcium: 9.9 mg/dL (ref 8.9–10.3)
Chloride: 100 mmol/L (ref 98–111)
Creatinine, Ser: 1.52 mg/dL — ABNORMAL HIGH (ref 0.61–1.24)
GFR calc Af Amer: 53 mL/min — ABNORMAL LOW (ref >60)
GFR calc non Af Amer: 46 mL/min — ABNORMAL LOW (ref >60)
Glucose, Bld: 110 mg/dL — ABNORMAL HIGH (ref 70–99)
Potassium: 5.4 mmol/L — ABNORMAL HIGH (ref 3.5–5.1)
Sodium: 141 mmol/L (ref 135–145)
Total Bilirubin: 0.8 mg/dL (ref 0.3–1.2)
Total Protein: 7.3 g/dL (ref 6.5–8.1)

## 2019-12-24 LAB — CBC
HCT: 41.1 % (ref 39.0–52.0)
Hemoglobin: 13.5 g/dL (ref 13.0–17.0)
MCH: 31.8 pg (ref 26.0–34.0)
MCHC: 32.8 g/dL (ref 30.0–36.0)
MCV: 96.7 fL (ref 80.0–100.0)
Platelets: 250 10*3/uL (ref 150–400)
RBC: 4.25 MIL/uL (ref 4.22–5.81)
RDW: 11.7 % (ref 11.5–15.5)
WBC: 16.5 10*3/uL — ABNORMAL HIGH (ref 4.0–10.5)
nRBC: 0 % (ref 0.0–0.2)

## 2019-12-24 LAB — CBC WITH DIFFERENTIAL/PLATELET
Abs Immature Granulocytes: 0.08 10*3/uL — ABNORMAL HIGH (ref 0.00–0.07)
Basophils Absolute: 0.1 10*3/uL (ref 0.0–0.1)
Basophils Relative: 0 %
Eosinophils Absolute: 0.1 10*3/uL (ref 0.0–0.5)
Eosinophils Relative: 1 %
HCT: 44.2 % (ref 39.0–52.0)
Hemoglobin: 14.4 g/dL (ref 13.0–17.0)
Immature Granulocytes: 1 %
Lymphocytes Relative: 15 %
Lymphs Abs: 2.6 10*3/uL (ref 0.7–4.0)
MCH: 31.3 pg (ref 26.0–34.0)
MCHC: 32.6 g/dL (ref 30.0–36.0)
MCV: 96.1 fL (ref 80.0–100.0)
Monocytes Absolute: 1 10*3/uL (ref 0.1–1.0)
Monocytes Relative: 6 %
Neutro Abs: 13.6 10*3/uL — ABNORMAL HIGH (ref 1.7–7.7)
Neutrophils Relative %: 77 %
Platelets: 237 10*3/uL (ref 150–400)
RBC: 4.6 MIL/uL (ref 4.22–5.81)
RDW: 11.6 % (ref 11.5–15.5)
WBC: 17.5 10*3/uL — ABNORMAL HIGH (ref 4.0–10.5)
nRBC: 0 % (ref 0.0–0.2)

## 2019-12-24 LAB — LACTIC ACID, PLASMA
Lactic Acid, Venous: 2.1 mmol/L (ref 0.5–1.9)
Lactic Acid, Venous: 2.3 mmol/L (ref 0.5–1.9)
Lactic Acid, Venous: 2.1 mmol/L (ref 0.5–1.9)

## 2019-12-24 LAB — SARS CORONAVIRUS 2 BY RT PCR (HOSPITAL ORDER, PERFORMED IN ~~LOC~~ HOSPITAL LAB): SARS Coronavirus 2: NEGATIVE

## 2019-12-24 LAB — MRSA PCR SCREENING: MRSA by PCR: NEGATIVE

## 2019-12-24 LAB — PROTIME-INR
INR: 1 (ref 0.8–1.2)
Prothrombin Time: 12.5 seconds (ref 11.4–15.2)

## 2019-12-24 LAB — CREATININE, SERUM
Creatinine, Ser: 1.51 mg/dL — ABNORMAL HIGH (ref 0.61–1.24)
GFR calc Af Amer: 53 mL/min — ABNORMAL LOW (ref >60)
GFR calc non Af Amer: 46 mL/min — ABNORMAL LOW (ref >60)

## 2019-12-24 LAB — APTT: aPTT: 29 seconds (ref 24–36)

## 2019-12-24 LAB — HIV ANTIBODY (ROUTINE TESTING W REFLEX): HIV Screen 4th Generation wRfx: NONREACTIVE

## 2019-12-24 MED ORDER — DULOXETINE HCL 30 MG PO CPEP
30.0000 mg | ORAL_CAPSULE | Freq: Every day | ORAL | Status: DC
  Administered 2019-12-25 – 2019-12-28 (×4): 30 mg via ORAL
  Filled 2019-12-24 (×5): qty 1

## 2019-12-24 MED ORDER — SODIUM CHLORIDE 0.9 % IV SOLN: INTRAVENOUS | Status: DC

## 2019-12-24 MED ORDER — ENOXAPARIN SODIUM 40 MG/0.4ML ~~LOC~~ SOLN
40.0000 mg | SUBCUTANEOUS | Status: DC
  Administered 2019-12-24 – 2019-12-27 (×4): 40 mg via SUBCUTANEOUS
  Filled 2019-12-24 (×4): qty 0.4

## 2019-12-24 MED ORDER — LACTATED RINGERS IV BOLUS (SEPSIS)
1000.0000 mL | Freq: Once | INTRAVENOUS | Status: AC
  Administered 2019-12-24: 1000 mL via INTRAVENOUS

## 2019-12-24 MED ORDER — SODIUM ZIRCONIUM CYCLOSILICATE 5 G PO PACK
5.0000 g | PACK | Freq: Three times a day (TID) | ORAL | Status: DC
  Administered 2019-12-24: 5 g via ORAL
  Filled 2019-12-24 (×2): qty 1

## 2019-12-24 MED ORDER — DONEPEZIL HCL 10 MG PO TABS
10.0000 mg | ORAL_TABLET | Freq: Every day | ORAL | Status: DC
  Administered 2019-12-24 – 2019-12-27 (×4): 10 mg via ORAL
  Filled 2019-12-24 (×4): qty 1

## 2019-12-24 MED ORDER — PAROXETINE HCL ER 12.5 MG PO TB24
12.5000 mg | ORAL_TABLET | Freq: Every day | ORAL | Status: DC
  Administered 2019-12-25 – 2019-12-28 (×4): 12.5 mg via ORAL
  Filled 2019-12-24 (×4): qty 1

## 2019-12-24 MED ORDER — ONDANSETRON HCL 4 MG PO TABS: 4.0000 mg | ORAL_TABLET | Freq: Four times a day (QID) | ORAL | Status: DC | PRN

## 2019-12-24 MED ORDER — DIVALPROEX SODIUM 125 MG PO CSDR
500.0000 mg | DELAYED_RELEASE_CAPSULE | Freq: Two times a day (BID) | ORAL | Status: DC
  Administered 2019-12-24 – 2019-12-28 (×8): 500 mg via ORAL
  Filled 2019-12-24 (×8): qty 4

## 2019-12-24 MED ORDER — MEMANTINE HCL 10 MG PO TABS
10.0000 mg | ORAL_TABLET | Freq: Two times a day (BID) | ORAL | Status: DC
  Administered 2019-12-24 – 2019-12-28 (×8): 10 mg via ORAL
  Filled 2019-12-24 (×8): qty 1

## 2019-12-24 MED ORDER — ACETAMINOPHEN 650 MG RE SUPP: 650.0000 mg | Freq: Four times a day (QID) | RECTAL | Status: DC | PRN

## 2019-12-24 MED ORDER — SODIUM CHLORIDE 0.9 % IV SOLN
1.0000 g | INTRAVENOUS | Status: DC
  Administered 2019-12-25 – 2019-12-26 (×2): 1 g via INTRAVENOUS
  Filled 2019-12-24 (×2): qty 10

## 2019-12-24 MED ORDER — ACETAMINOPHEN 325 MG PO TABS
650.0000 mg | ORAL_TABLET | Freq: Four times a day (QID) | ORAL | Status: DC | PRN
  Filled 2019-12-24: qty 2

## 2019-12-24 MED ORDER — TAMSULOSIN HCL 0.4 MG PO CAPS
0.4000 mg | ORAL_CAPSULE | Freq: Every day | ORAL | Status: DC
  Administered 2019-12-25 – 2019-12-28 (×4): 0.4 mg via ORAL
  Filled 2019-12-24 (×4): qty 1

## 2019-12-24 MED ORDER — SODIUM CHLORIDE 0.9 % IV SOLN
1.0000 g | INTRAVENOUS | Status: DC
  Administered 2019-12-24: 1 g via INTRAVENOUS
  Filled 2019-12-24: qty 10

## 2019-12-24 MED ORDER — MELATONIN 3 MG PO TABS
3.0000 mg | ORAL_TABLET | Freq: Every day | ORAL | Status: DC
  Administered 2019-12-24 – 2019-12-27 (×4): 3 mg via ORAL
  Filled 2019-12-24 (×4): qty 1

## 2019-12-24 MED ORDER — ONDANSETRON HCL 4 MG/2ML IJ SOLN: 4.0000 mg | Freq: Four times a day (QID) | INTRAMUSCULAR | Status: DC | PRN

## 2019-12-24 NOTE — ED Notes (Signed)
Got patient undress into a gown on the monitor did ekg shown to Dr Kathrynn Humble patient is resting with call bell in reach

## 2019-12-24 NOTE — Progress Notes (Signed)
Notified bedside nurse of need to draw repeat lactic acid @ 1844.

## 2019-12-24 NOTE — ED Notes (Signed)
Attempted report 

## 2019-12-24 NOTE — ED Triage Notes (Signed)
Pt arrives by EMS from [facility] for AMS and diaphoresis. Pt LSN last night. Pt arrives to ED alert, oriented to self. Pt very hot to touch.   BP 142/84 HR 100 RR 20 Capnography 45 CBG 104

## 2019-12-24 NOTE — Progress Notes (Signed)
Notified provider (admitting MD)of need to draw repeat lactic acid.

## 2019-12-24 NOTE — ED Provider Notes (Signed)
  Provider Note MRN:  [id number]  Arrival date & time: 12/24/19    ED Course and Medical Decision Making  Assumed care from Dr. Kathrynn Humble at shift change.  Fever, altered mental status, suspect UTI and sepsis, awaiting urinalysis, will need admission.  On reassessment patient is resting peacefully, urinalysis is consistent with infection, will admit to hospital service  .Critical Care Performed by: Maudie Flakes, MD Authorized by: Maudie Flakes, MD   Critical care provider statement:    Critical care time (minutes):  32   Critical care was necessary to treat or prevent imminent or life-threatening deterioration of the following conditions:  Sepsis   Critical care was time spent personally by me on the following activities:  Discussions with consultants, evaluation of patient's response to treatment, examination of patient, ordering and performing treatments and interventions, ordering and review of laboratory studies, ordering and review of radiographic studies, pulse oximetry, re-evaluation of patient's condition, obtaining history from patient or surrogate and review of old charts   I assumed direction of critical care for this patient from another provider in my specialty: yes      Final Clinical Impressions(s) / ED Diagnoses     ICD-10-CM   1. Altered mental status, unspecified altered mental status type  R41.82   2. Severe sepsis ([place])  A41.9    R65.20     ED Discharge Orders    None      Discharge Instructions   None     Barth Kirks. Sedonia Small, [place] [email or web address]    Maudie Flakes, MD 12/24/19 [phone]

## 2019-12-24 NOTE — ED Provider Notes (Signed)
[facility] EMERGENCY DEPARTMENT Provider Note   CSN: [id number] Arrival date & time: 12/24/19  1410     History Chief Complaint  Patient presents with  . Altered Mental Status    Tony [name] is a 69 y.o. male.  HPI     69 year old male comes in a chief complaint of altered mental status. Patient has history of diabetes, CKD, seizure disorder and hypertension.  Patient resides at [facility] facility, where he has been since last December after he had a bout of pneumonia and urosepsis.  Patient's husband is one of the [facility] is in the hospital and has medical POA.  Level 5 caveat for altered mental status.  Patient is answering direct questions and following commands but appears sluggish.  His brother reports that at baseline patient is more alert and able to hold conversation.  He does have dementia and memory deficits.  Patient had similar symptoms with urosepsis.  Patient is full code.  Past Medical History:  Diagnosis Date  . Alzheimer's disease ([place])   . BPH (benign prostatic hyperplasia)   . CKD (chronic kidney disease)   . Diabetes mellitus    Type II, diagnosed 2011, not on insulin; admitted in 2011 for hyperglycemia  . HTN (hypertension)   . Seizure disorder ([place])    diagnosed in childhood, last seizure was years ago    Patient Active Problem List   Diagnosis Date Noted  . Urinary retention 07/10/2019  . Acute metabolic encephalopathy [id number]  . Sepsis ([place])   . Pressure injury of skin 06/13/2019  . Complicated UTI (urinary tract infection) 06/12/2019  . Sepsis secondary to UTI ([place]) 06/12/2019  . Acute renal failure superimposed on stage 3b chronic kidney disease ([place]) 06/12/2019  . BPH with obstruction/lower urinary tract symptoms 06/12/2019  . Sepsis with acute renal failure without septic shock ([place])   . Acute urinary retention   . UTI (urinary tract infection) 04/10/2019  . Acute UTI 04/10/2019  . Seizure ([place]) 12/15/2018    . CAP (community acquired pneumonia) 12/14/2018  . Dementia ([place]) 10/20/2018  . Enlarged prostate without lower urinary tract symptoms ([place]) 06/30/2018  . Hyperprolactinemia ([place]) 11/20/2017  . HLD (hyperlipidemia) 09/02/2016  . Chronic kidney disease 03/19/2016  . Gynecomastia, male 04/25/2015  . Routine health maintenance 04/25/2015  . Erectile disorder due to medical condition in male patient 05/07/2014  . Hypertension 12/12/2011  . Prediabetes 12/12/2011  . Seizure disorder ([place]) 12/12/2011    Past Surgical History:  Procedure Laterality Date  . NO PAST SURGERIES    . TRANSURETHRAL RESECTION OF PROSTATE N/A 07/10/2019   Procedure: TRANSURETHRAL RESECTION OF THE PROSTATE (TURP);  Surgeon: Lucas Mallow, MD;  Location: [facility] ORS;  Service: Urology;  Laterality: N/A;       Family History  Problem Relation Age of Onset  . Diabetes Mother   . Hypertension Mother     Social History   Tobacco Use  . Smoking status: Never Smoker  . Smokeless tobacco: Never Used  Substance Use Topics  . Alcohol use: No    Alcohol/week: 0.0 standard drinks  . Drug use: No    Home Medications Prior to Admission medications   Medication Sig Start Date End Date Taking? Authorizing Provider  acetaminophen (TYLENOL) 500 MG tablet Take 500 mg by mouth every 8 (eight) hours.   Yes [provider]  acetaminophen (TYLENOL) 650 MG CR tablet Take 650 mg by mouth every 8 (eight) hours as needed for  pain.   Yes [provider]  amLODipine (NORVASC) 2.5 MG tablet Take 2.5 mg by mouth daily. Hold for BP less than 90/70 in AM.   Yes [provider]  cabergoline (DOSTINEX) 0.5 MG tablet Take 0.25 mg by mouth once a week.   Yes [provider]  DULoxetine (CYMBALTA) 30 MG capsule Take 30 mg by mouth daily.    Yes [provider]  memantine (NAMENDA) 10 MG tablet Take 10 mg by mouth 2 (two) times daily.   Yes [provider]  Menthol, Topical  Analgesic, (BIOFREEZE) 4 % GEL Apply 1 application topically 2 (two) times daily as needed (for back pain).   Yes [provider]  Multiple Vitamin (MULTIVITAMIN WITH MINERALS) TABS tablet Take 1 tablet by mouth daily.   Yes [provider]  PARoxetine (PAXIL-CR) 12.5 MG 24 hr tablet Take 12.5 mg by mouth daily.   Yes [provider]  Amino Acids-Protein Hydrolys (FEEDING SUPPLEMENT, PRO-STAT SUGAR FREE 64,) LIQD Take 30 mLs by mouth 2 (two) times daily.    [provider]  amLODipine (NORVASC) 5 MG tablet Take 1 tablet (5 mg total) by mouth daily. Patient not taking: Reported on 12/24/2019 04/15/19 07/08/19  Heath Lark D, DO  divalproex (DEPAKOTE ER) 500 MG 24 hr tablet Take 2 tablets (1,000 mg total) by mouth daily. Patient taking differently: Take 1,500 mg by mouth daily.  12/16/18   Masoudi, Elhamalsadat, MD  donepezil (ARICEPT) 5 MG tablet Take 1 tablet (5 mg total) by mouth at bedtime. 12/16/18   Masoudi, Dorthula Rue, MD  HYDROcodone-acetaminophen (NORCO/VICODIN) 5-325 MG tablet Take 1 tablet by mouth every 4 (four) hours as needed for moderate pain. 07/10/19 07/09/20  Marton Redwood III, MD  lisinopril (ZESTRIL) 20 MG tablet Take 20 mg by mouth daily.    [provider]  tamsulosin (FLOMAX) 0.4 MG CAPS capsule Take 1 capsule (0.4 mg total) by mouth daily after breakfast. 12/16/18   Masoudi, Dorthula Rue, MD  Vitamin D, Ergocalciferol, (DRISDOL) 1.25 MG (50000 UT) CAPS capsule Take 50,000 Units by mouth every Friday.     [provider]    Allergies    Patient has no known allergies.  Review of Systems   Review of Systems  Unable to perform ROS: Mental status change    Physical Exam Updated Vital Signs BP 131/74   Pulse 90   Temp 98.8 F (37.1 C) (Oral)   Resp 16   Ht 5\' 11"  (1.803 m)   Wt 86.3 kg   SpO2 100%   BMI 26.54 kg/m   Physical Exam Vitals and nursing note reviewed.  Constitutional:      Appearance: He is  well-developed. He is diaphoretic.  HENT:     Head: Atraumatic.  Cardiovascular:     Rate and Rhythm: Tachycardia present.  Pulmonary:     Effort: Pulmonary effort is normal.  Musculoskeletal:     Cervical back: Neck supple.  Skin:    General: Skin is warm.  Neurological:     Mental Status: He is disoriented.     Comments: Moving all 4 extremities and gross sensory exam appears normal for all 4 extremities.     ED Results / Procedures / Treatments   Labs (all labs ordered are listed, but only abnormal results are displayed) Labs Reviewed  LACTIC ACID, PLASMA - Abnormal; Notable for the following components:      Result Value   Lactic Acid, Venous 2.1 (*)    All  other components within normal limits  COMPREHENSIVE METABOLIC PANEL - Abnormal; Notable for the following components:   Potassium 5.4 (*)    Glucose, Bld 110 (*)    Creatinine, Ser 1.52 (*)    GFR calc non Af Amer 46 (*)    GFR calc Af Amer 53 (*)    All other components within normal limits  CBC WITH DIFFERENTIAL/PLATELET - Abnormal; Notable for the following components:   WBC 17.5 (*)    Neutro Abs 13.6 (*)    Abs Immature Granulocytes 0.08 (*)    All other components within normal limits  CULTURE, BLOOD (ROUTINE X 2)  CULTURE, BLOOD (ROUTINE X 2)  URINE CULTURE  SARS CORONAVIRUS 2 BY RT PCR (HOSPITAL ORDER, [facility])  LACTIC ACID, PLASMA  APTT  PROTIME-INR  URINALYSIS, ROUTINE W REFLEX MICROSCOPIC    EKG EKG Interpretation  Date/Time:  Thursday Dec 24 2019 14:22:06 EDT Ventricular Rate:  92 PR Interval:    QRS Duration: 81 QT Interval:  328 QTC Calculation: 406 R Axis:   6 Text Interpretation: Sinus rhythm Abnormal R-wave progression, early transition Nonspecific T abnormalities, lateral leads No acute changes No significant change since last tracing Confirmed by Varney Biles [phone]) on 12/24/2019 2:30:19 PM   Radiology DG Chest Port 1 View  Result Date:  12/24/2019 CLINICAL DATA:  Pneumonia, history of Alzheimer's disease, hypertension, type II diabetes mellitus EXAM: PORTABLE CHEST 1 VIEW COMPARISON:  Portable exam 1443 hours compared to 06/18/2019 FINDINGS: Normal heart size, mediastinal contours, and pulmonary vascularity. Lungs clear. No pleural effusion or pneumothorax. Bones unremarkable. IMPRESSION: No acute abnormalities. Electronically Signed   By: Lavonia Dana M.D.   On: 12/24/2019 15:08    Procedures .Critical Care Performed by: Varney Biles, MD Authorized by: Varney Biles, MD   Critical care provider statement:    Critical care time (minutes):  32   Critical care was necessary to treat or prevent imminent or life-threatening deterioration of the following conditions:  Sepsis   Critical care was time spent personally by me on the following activities:  Discussions with consultants, evaluation of patient's response to treatment, examination of patient, ordering and performing treatments and interventions, ordering and review of laboratory studies, ordering and review of radiographic studies, pulse oximetry, re-evaluation of patient's condition, obtaining history from patient or surrogate and review of old charts   (including critical care time)  Medications Ordered in ED Medications  cefTRIAXone (ROCEPHIN) 1 g in sodium chloride 0.9 % 100 mL IVPB (0 g Intravenous Stopped 12/24/19 1537)  lactated ringers bolus 1,000 mL (0 mLs Intravenous Stopped 12/24/19 1544)    ED Course  I have reviewed the triage vital signs and the nursing notes.  Pertinent labs & imaging results that were available during my care of the patient were reviewed by me and considered in my medical decision making (see chart for details).    MDM Rules/Calculators/A&P                      69 year old male comes in a chief complaint of altered mental status.  He is noted to be febrile, tachycardic along with his confusion.  Last known normal was last night.   Family at the bedside, provides meaningful history.  Based on the initial history and exam it appears that patient likely is septic again.  We will start antibiotics and activate code sepsis.  The source is likely urine.  CT scan of the brain will  be ordered to ensure there is no intracranial bleed.  Patient has known history of tumors in the past.  Other possibilities include electrolyte abnormalities, seizure and stroke.  Dr. Sedonia Small to f/u on results. Pt will need admission.   Final Clinical Impression(s) / ED Diagnoses Final diagnoses:  Altered mental status, unspecified altered mental status type  Severe sepsis [facility])    Rx / DC Orders ED Discharge Orders    None       Varney Biles, MD 12/24/19 1558

## 2019-12-24 NOTE — ED Notes (Signed)
Pt transported to CT ?

## 2019-12-24 NOTE — Progress Notes (Signed)
Notified provider of need to draw repeat lactic acid @ 1844.

## 2019-12-24 NOTE — H&P (Signed)
History and Physical    GABRIELE GOWER [id number] DOB: 1950-10-27 DOA: 12/24/2019  PCP: No primary care provider on file.   Patient coming from: SNF   Chief Complaint:  Altered mental status and diaphoresis.   HPI: Tony [name] is a 69 y.o. male with medical history significant of Alzheimer's dementia, chronic kidney disease, type 2 diabetes mellitus, hypertension, seizures, and BPH.  He has been in a nursing home since last December, apparently had pneumonia and sepsis due to urinary tract infection. Apparently today patient was found to have worsening mentation and diaphoresis.  Patient unable to give any detailed history due to his advanced cognitive impairment.  All information obtained from medical record, apparently his brother gave the information that at his baseline Mr. Lohith is able to hold conversation.  Unable to get further information at this point.  ED Course: Patient was found altered, and dehydrated, positive leukocytosis and worsening kidney function.  Elevated lactic acid 2.1 He received 1 L of lactated Ringer's IV and IV ceftriaxone.  Referred for further admission and evaluation.  Review of Systems: Unable to obtain due to cognitive impairment   Past Medical History:  Diagnosis Date  . Alzheimer's disease ([place])   . BPH (benign prostatic hyperplasia)   . CKD (chronic kidney disease)   . Diabetes mellitus    Type II, diagnosed 2011, not on insulin; admitted in 2011 for hyperglycemia  . HTN (hypertension)   . Seizure disorder ([place])    diagnosed in childhood, last seizure was years ago    Past Surgical History:  Procedure Laterality Date  . NO PAST SURGERIES    . TRANSURETHRAL RESECTION OF PROSTATE N/A 07/10/2019   Procedure: TRANSURETHRAL RESECTION OF THE PROSTATE (TURP);  Surgeon: Lucas Mallow, MD;  Location: [facility];  Service: Urology;  Laterality: N/A;     reports that he has never smoked. He has never used smokeless tobacco. He reports that he does  not drink alcohol or use drugs.  No Known Allergies  Family History  Problem Relation Age of Onset  . Diabetes Mother   . Hypertension Mother      Prior to Admission medications   Medication Sig Start Date End Date Taking? Authorizing Provider  acetaminophen (TYLENOL) 500 MG tablet Take 500 mg by mouth every 8 (eight) hours.   Yes [provider]  acetaminophen (TYLENOL) 650 MG CR tablet Take 650 mg by mouth every 8 (eight) hours as needed for pain.   Yes [provider]  Amino Acids-Protein Hydrolys (FEEDING SUPPLEMENT, PRO-STAT SUGAR FREE 64,) LIQD Take 30 mLs by mouth 2 (two) times daily.   Yes [provider]  amLODipine (NORVASC) 2.5 MG tablet Take 2.5 mg by mouth daily. Hold for BP less than 90/70 in AM.   Yes [provider]  cabergoline (DOSTINEX) 0.5 MG tablet Take 0.25 mg by mouth once a week.   Yes [provider]  divalproex (DEPAKOTE SPRINKLE) 125 MG capsule Take 500 mg by mouth in the morning, at noon, and at bedtime.   Yes [provider]  donepezil (ARICEPT) 10 MG tablet Take 10 mg by mouth at bedtime.   Yes [provider]  DULoxetine (CYMBALTA) 30 MG capsule Take 30 mg by mouth daily.    Yes [provider]  melatonin 3 MG TABS tablet Take 3 mg by mouth at bedtime.   Yes [provider]  memantine (NAMENDA) 10 MG tablet Take 10 mg by mouth 2 (two) times  daily.   Yes [provider]  Menthol, Topical Analgesic, (BIOFREEZE) 4 % GEL Apply 1 application topically 2 (two) times daily as needed (for back pain).   Yes [provider]  Multiple Vitamin (MULTIVITAMIN WITH MINERALS) TABS tablet Take 1 tablet by mouth daily.   Yes [provider]  PARoxetine (PAXIL-CR) 12.5 MG 24 hr tablet Take 12.5 mg by mouth daily.   Yes [provider]  tamsulosin (FLOMAX) 0.4 MG CAPS capsule Take 1 capsule (0.4 mg total) by mouth daily after breakfast. 12/16/18  Yes Masoudi,  Elhamalsadat, MD  amLODipine (NORVASC) 5 MG tablet Take 1 tablet (5 mg total) by mouth daily. Patient not taking: Reported on 12/24/2019 04/15/19 07/08/19  Heath Lark D, DO  divalproex (DEPAKOTE ER) 500 MG 24 hr tablet Take 2 tablets (1,000 mg total) by mouth daily. Patient not taking: Reported on 12/24/2019 12/16/18   Masoudi, Dorthula Rue, MD  donepezil (ARICEPT) 5 MG tablet Take 1 tablet (5 mg total) by mouth at bedtime. Patient not taking: Reported on 12/24/2019 12/16/18   Dewayne Hatch, MD  HYDROcodone-acetaminophen (NORCO/VICODIN) 5-325 MG tablet Take 1 tablet by mouth every 4 (four) hours as needed for moderate pain. Patient not taking: Reported on 12/24/2019 07/10/19 07/09/20  Lucas Mallow, MD  Vitamin D, Ergocalciferol, (DRISDOL) 1.25 MG (50000 UT) CAPS capsule Take 50,000 Units by mouth every Friday.     [provider]    Physical Exam: Vitals:   12/24/19 1544 12/24/19 1630 12/24/19 1700 12/24/19 1800  BP:  119/62 136/69 124/72  Pulse:  77 73 72  Resp:  14 12 13   Temp: 98.8 F (37.1 C)     TempSrc: Oral     SpO2:  94% 96% 95%  Weight:      Height:        Vitals:   12/24/19 1544 12/24/19 1630 12/24/19 1700 12/24/19 1800  BP:  119/62 136/69 124/72  Pulse:  77 73 72  Resp:  14 12 13   Temp: 98.8 F (37.1 C)     TempSrc: Oral     SpO2:  94% 96% 95%  Weight:      Height:       General: deconditioned and ill looking appearing  Neurology: Awake and alert, non focal, disorientated time place, time and circumstance.  Head and Neck. Head normocephalic. Neck supple with no adenopathy or thyromegaly.   E ENT: mild pallor, no icterus, oral mucosa moist Cardiovascular: No JVD. S1-S2 present, rhythmic, no gallops, rubs, or murmurs. No lower extremity edema. Pulmonary: positive breath sounds bilaterally, no wheezing, rhonchi or rales. Gastrointestinal. Abdomen with no organomegaly, non tender, no rebound or guarding Skin. No rashes Musculoskeletal: no joint  deformities    Labs on Admission: I have personally reviewed following labs and imaging studies  CBC: Recent Labs  Lab 12/24/19 1424  WBC 17.5*  NEUTROABS 13.6*  HGB 14.4  HCT 44.2  MCV 96.1  PLT 123XX123   Basic Metabolic Panel: Recent Labs  Lab 12/24/19 1424  NA 141  K 5.4*  CL 100  CO2 26  GLUCOSE 110*  BUN 23  CREATININE 1.52*  CALCIUM 9.9   GFR: Estimated Creatinine Clearance: 48.2 mL/min (A) (by C-G formula based on SCr of 1.52 mg/dL (H)). Liver Function Tests: Recent Labs  Lab 12/24/19 1424  AST 23  ALT 13  ALKPHOS 64  BILITOT 0.8  PROT 7.3  ALBUMIN 3.7   No results for input(s): LIPASE, AMYLASE in the last 168 hours.  No results for input(s): AMMONIA in the last 168 hours. Coagulation Profile: Recent Labs  Lab 12/24/19 1424  INR 1.0   Cardiac Enzymes: No results for input(s): CKTOTAL, CKMB, CKMBINDEX, TROPONINI in the last 168 hours. BNP (last 3 results) No results for input(s): PROBNP in the last 8760 hours. HbA1C: No results for input(s): HGBA1C in the last 72 hours. CBG: No results for input(s): GLUCAP in the last 168 hours. Lipid Profile: No results for input(s): CHOL, HDL, LDLCALC, TRIG, CHOLHDL, LDLDIRECT in the last 72 hours. Thyroid Function Tests: No results for input(s): TSH, T4TOTAL, FREET4, T3FREE, THYROIDAB in the last 72 hours. Anemia Panel: No results for input(s): VITAMINB12, FOLATE, FERRITIN, TIBC, IRON, RETICCTPCT in the last 72 hours. Urine analysis:    Component Value Date/Time   COLORURINE YELLOW 12/24/2019 1427   APPEARANCEUR CLEAR 12/24/2019 1427   LABSPEC 1.017 12/24/2019 1427   PHURINE 6.0 12/24/2019 1427   GLUCOSEU NEGATIVE 12/24/2019 1427   HGBUR NEGATIVE 12/24/2019 1427   BILIRUBINUR NEGATIVE 12/24/2019 1427   KETONESUR NEGATIVE 12/24/2019 1427   PROTEINUR NEGATIVE 12/24/2019 1427   UROBILINOGEN 0.2 02/07/2010 1547   NITRITE NEGATIVE 12/24/2019 1427   LEUKOCYTESUR SMALL (A) 12/24/2019 1427    Radiological  Exams on Admission: CT Head Wo Contrast  Result Date: 12/24/2019 CLINICAL DATA:  Altered level of consciousness, diaphoresis EXAM: CT HEAD WITHOUT CONTRAST TECHNIQUE: Contiguous axial images were obtained from the base of the skull through the vertex without intravenous contrast. COMPARISON:  04/10/2019 FINDINGS: Brain: No acute infarct or hemorrhage. Chronic ischemic changes bilateral cerebellar hemispheres, stable. Lateral ventricles and midline structures are unremarkable. No acute extra-axial fluid collections. No mass effect. Vascular: No hyperdense vessel or unexpected calcification. Skull: Normal. Negative for fracture or focal lesion. Sinuses/Orbits: No acute finding. Other: None. IMPRESSION: 1. Stable head CT, no acute process. Electronically Signed   By: Randa Ngo M.D.   On: 12/24/2019 16:30   DG Chest Port 1 View  Result Date: 12/24/2019 CLINICAL DATA:  Pneumonia, history of Alzheimer's disease, hypertension, type II diabetes mellitus EXAM: PORTABLE CHEST 1 VIEW COMPARISON:  Portable exam 1443 hours compared to 06/18/2019 FINDINGS: Normal heart size, mediastinal contours, and pulmonary vascularity. Lungs clear. No pleural effusion or pneumothorax. Bones unremarkable. IMPRESSION: No acute abnormalities. Electronically Signed   By: Lavonia Dana M.D.   On: 12/24/2019 15:08    EKG: Independently reviewed.  82 bpm, normal axis, normal intervals, sinus rhythm, no ST segment T wave changes.  Assessment/Plan Principal Problem:   Sepsis secondary to UTI [facility]) Active Problems:   Hypertension   Seizure disorder ([place])   Chronic kidney disease   HLD (hyperlipidemia)   Dementia (H[facility])   Metabolic encephalopathy   69 year old male with advanced dementia, nursing home resident, who was noted to have worsening mentation and diaphoresis.  He is known to have urinary tract infections in the past.  On his initial physical examination his temperature is 98.8, blood pressure 131/74, heart rate 90,  respiratory rate 16, oxygen saturation 100%.  Is awake and alert but disoriented, unable to give any detailed history, his lungs are clear to auscultation, heart S1-S2, present rhythmic, soft abdomen, no lower extremity edema. Sodium 141, potassium 5.4, chloride 100, bicarb 26, glucose 110, BUN 23, creatinine 1.52, anion gap 15, lactic acid 2.1-2.3, white count 17.5, hemoglobin 14.4, hematocrit 44.2, platelets 237.  SARS COVID-19 negative.  Urinalysis 21-50 white cells, specific gravity 1.017.  Chest radiograph negative for infiltrates.  Patient will be admitted to the hospital with  working diagnosis of sepsis due to urinary tract infection, endorgan damage, acute kidney injury, lactic acidosis, and metabolic encephalopathy  1.  Sepsis due to urinary tract infection, endorgan damage acute kidney injury, lactic acidosis and metabolic encephalopathy.  Patient will be admitted to the telemetry ward.  Continue antibiotic therapy with intravenous ceftriaxone, follow-up on cell count, cultures and temperature curve.  Continue hydration with isotonic saline at 75 cc/h.  Follow-up on lactic acid.  2.  Metabolic encephalopathy.  Continue neurochecks per unit protocol.  Supportive medical therapy with IV fluids and IV antibiotics.  3.  Acute kidney injury chronic kidney disease stage II with hyperkalemia and anion gap metabolic acidosis.  Based creatinine 1.3, will continue hydration with isotonic saline at 75 ml per H.  Will give 3 dose of sodium zirconium, for hyperkalemia.  Follow-up kidney function in the morning, avoid hypotension and nephrotoxic agents.  4.  Hypertension.  Will continue holding amlodipine, due to risk of hypotension.  5.  Seizures.  Continue divalproex.   6.  Alzheimer's disease/ depression.  Continue paroxetine, memantine and donepezil.  7. BPH. Continue with flomax.   Status is: Inpatient  Remains inpatient appropriate because:IV treatments appropriate due to intensity of  illness or inability to take PO   Dispo: The patient is from: Home              Anticipated d/c is to: Home              Anticipated d/c date is: 3 days              Patient currently is not medically stable to d/c.    DVT prophylaxis: Enoxaparin   Code Status:   full  Family Communication:  No family at the bedside     Consults called:  None   Admission status:  Inpatient.    Paulita Licklider Gerome Apley MD [facility]   12/24/2019, 6:32 PM

## 2019-12-24 NOTE — Progress Notes (Signed)
Notified bedside nurse of need to draw repeat lactic acid. 

## 2019-12-25 DIAGNOSIS — N390 Urinary tract infection, site not specified: Secondary | ICD-10-CM

## 2019-12-25 DIAGNOSIS — A419 Sepsis, unspecified organism: Secondary | ICD-10-CM

## 2019-12-25 LAB — LACTIC ACID, PLASMA: Lactic Acid, Venous: 2.1 mmol/L (ref 0.5–1.9)

## 2019-12-25 LAB — CBC
HCT: 40.4 % (ref 39.0–52.0)
Hemoglobin: 12.9 g/dL — ABNORMAL LOW (ref 13.0–17.0)
MCH: 31.3 pg (ref 26.0–34.0)
MCHC: 31.9 g/dL (ref 30.0–36.0)
MCV: 98.1 fL (ref 80.0–100.0)
Platelets: 185 10*3/uL (ref 150–400)
RBC: 4.12 MIL/uL — ABNORMAL LOW (ref 4.22–5.81)
RDW: 11.5 % (ref 11.5–15.5)
WBC: 13 10*3/uL — ABNORMAL HIGH (ref 4.0–10.5)
nRBC: 0 % (ref 0.0–0.2)

## 2019-12-25 LAB — BASIC METABOLIC PANEL
Anion gap: 7 (ref 5–15)
BUN: 19 mg/dL (ref 8–23)
CO2: 30 mmol/L (ref 22–32)
Calcium: 9.4 mg/dL (ref 8.9–10.3)
Chloride: 102 mmol/L (ref 98–111)
Creatinine, Ser: 1.29 mg/dL — ABNORMAL HIGH (ref 0.61–1.24)
GFR calc Af Amer: >60 mL/min (ref >60)
GFR calc non Af Amer: 56 mL/min — ABNORMAL LOW (ref >60)
Glucose, Bld: 94 mg/dL (ref 70–99)
Potassium: 4.5 mmol/L (ref 3.5–5.1)
Sodium: 139 mmol/L (ref 135–145)

## 2019-12-25 MED ORDER — SODIUM CHLORIDE 0.9 % IV SOLN: INTRAVENOUS | Status: AC

## 2019-12-25 NOTE — Progress Notes (Signed)
MD, according to the NH paperwork he came from, pt already completed his Cymbalta for 7 days prior to admission, did u want him to continue taking again?  Please address, thanks Arvella Nigh RN.

## 2019-12-25 NOTE — TOC Initial Note (Signed)
Transition of Care [facility]) - Initial/Assessment Note    Patient Details  Name: Tony [name] MRN: [id number] Date of Birth: [birth year]  Transition of [facility]) CM/SW Contact:    Amador Cunas, [place] Phone Number: 12/25/2019, 4:33 PM  Clinical Narrative:   Pt admitted from [facility] where he is a LTC resident, PACE benefits. Voicemail left for [place], [place] with update. VM left for [facility] in [place] admissions notifying her of possible dc over weekend. SW will follow.   Wandra Feinstein, MSW, LCSW [phone] (coverage)                   Expected Discharge Plan: [place] Barriers to Discharge: Continued Medical Work up   Patient Goals and CMS Choice        Expected Discharge Plan and Services Expected Discharge Plan: [place] arrangements for the past 2 months: [place]                                      Prior Living Arrangements/Services Living arrangements for the past 2 months: [place] Lives with:: Facility Resident Patient language and need for interpreter reviewed:: No          Care giver support system in place?: Yes (comment)   Criminal Activity/Legal Involvement Pertinent to Current Situation/Hospitalization: No - Comment as needed  Activities of Daily Living      Permission Sought/Granted Permission sought to share information with : Customer service manager                Emotional Assessment       Orientation: : Fluctuating Orientation (Suspected and/or reported [place])   Psych Involvement: No (comment)  Admission diagnosis:  Metabolic encephalopathy 99991111 Severe sepsis ([place]) [A41.9, R65.20] Altered mental status, unspecified altered mental status type [R41.82] Patient Active Problem List   Diagnosis Date Noted  . Metabolic encephalopathy [id number]  . Urinary retention 07/10/2019  . Acute metabolic  encephalopathy 123XX123  . Sepsis ([place])   . Pressure injury of skin 06/13/2019  . Complicated UTI (urinary tract infection) 06/12/2019  . Sepsis secondary to UTI ([place]) 06/12/2019  . Acute renal failure superimposed on stage 3b chronic kidney disease ([place]) 06/12/2019  . BPH with obstruction/lower urinary tract symptoms 06/12/2019  . Sepsis with acute renal failure without septic shock ([place])   . Acute urinary retention   . UTI (urinary tract infection) 04/10/2019  . Acute UTI 04/10/2019  . Seizure ([place]) 12/15/2018  . CAP (community acquired pneumonia) 12/14/2018  . Dementia ([place]) 10/20/2018  . Enlarged prostate without lower urinary tract symptoms ([place]) 06/30/2018  . Hyperprolactinemia ([place]) 11/20/2017  . HLD (hyperlipidemia) 09/02/2016  . Chronic kidney disease 03/19/2016  . Gynecomastia, male 04/25/2015  . Routine health maintenance 04/25/2015  . Erectile disorder due to medical condition in male patient 05/07/2014  . Hypertension 12/12/2011  . Prediabetes 12/12/2011  . Seizure disorder ([place]) 12/12/2011   PCP:  No primary care provider on file. Pharmacy:   [place], [place] - [place] [id number] [place] [address] Phone: [phone] Fax: [phone]     Social Determinants of Health (SDOH) Interventions    Readmission Risk Interventions No flowsheet data found.

## 2019-12-25 NOTE — Progress Notes (Signed)
PROGRESS NOTE    Tony [name]  [id number] DOB: 1950/10/23 DOA: 12/24/2019 PCP: No primary care provider on file.  Brief Narrative: Tony [name] is a 69 y.o. male with medical history significant of Alzheimer's dementia, chronic kidney disease, type 2 diabetes mellitus, hypertension, seizures, and BPH.  He has been in a nursing home since last December, apparently had pneumonia and sepsis due to urinary tract infection. Apparently 5/27 patient was found to have worsening mentation and diaphoresis.  Patient unable to give any detailed history due to his advanced cognitive impairment.  All information obtained from medical record, apparently his brother gave the information that at his baseline Tony [name] is able to hold conversation. ED Course: Patient was found altered, and dehydrated, positive leukocytosis and worsening kidney function.  Elevated lactic acid 2.1 He received 1 L of lactated Ringer's IV and IV ceftriaxone.  Assessment & Plan:  Sepsis due to urinary tract infection Metabolic encephalopathy -With acute kidney injury, lactic acidosis -mental status improving -Continue IV fluids today, cut down rate  -Continue IV ceftriaxone, follow-up urine cultures  -PT OT, ambulate  -Continue Flomax, monitor for urinary retention   Acute kidney injury chronic kidney disease stage II with hyperkalemia and anion gap metabolic acidosis.   -Based creatinine 1.3,  -Creatinine improving with hydration, cut down IV fluids today,  Hypertension.   -BP stable, amlodipine on hold  History of seizure disorder  -Continue Depakote    Alzheimer's disease/ depression.   -Continue paroxetine, memantine and donepezil.  BPH.  -Continue Flomax, monitor for urinary retention   Debility History of arthritis, chronic leg left leg weakness -Reports using a wheelchair most of the time, walker sometimes, PT OT  DVT prophylaxis: Lovenox CODE STATUS: Full code Disposition  Status is:  Inpatient  Remains inpatient appropriate because: Sepsis, metabolic encephalopathy, UTI  Dispo: The patient is from:  SNF  Anticipated d/c is to: SNF  Anticipated d/c date is: 1 to 2 days  Patient currently is not medically stable to d/c.  Procedures:   Antimicrobials:    Subjective: -feels better, slightly confused, no events overnight  Objective: Vitals:   12/24/19 2244 12/25/19 0030 12/25/19 0418 12/25/19 0835  BP: 123/66 113/68 (!) 143/91 129/69  Pulse: 74 70 63 68  Resp: 18 18 14 18   Temp: 98.8 F (37.1 C) 99.4 F (37.4 C) 97.8 F (36.6 C) 98.3 F (36.8 C)  TempSrc: Oral Oral Oral Oral  SpO2: 99% 97% 100% 99%  Weight:   85.7 kg   Height:        Intake/Output Summary (Last 24 hours) at 12/25/2019 1055 Last data filed at 12/25/2019 [id number] Gross per 24 hour  Intake 2194.21 ml  Output 0 ml  Net 2194.21 ml   Filed Weights   12/24/19 1415 12/24/19 2000 12/25/19 0418  Weight: 86.3 kg 84.6 kg 85.7 kg    Examination:  General exam: Chronically ill elderly male laying in bed, awake alert, disoriented, no distress Respiratory system: Clear  cardiovascular system: S1 & S2 heard, RRR. Gastrointestinal system: Abdomen is nondistended, soft and nontender.Normal bowel sounds heard. Extremities: No edema, mild left leg weakness per patient this is chronic Skin: No rashes on exposed skin Psychiatry: Poor insight and judgment    Data Reviewed:   CBC: Recent Labs  Lab 12/24/19 1424 12/24/19 2030 12/25/19 0359  WBC 17.5* 16.5* 13.0*  NEUTROABS 13.6*  --   --   HGB 14.4 13.5 12.9*  HCT 44.2 41.1 40.4  MCV 96.1 96.7  98.1  PLT 237 250 [id number]   Basic Metabolic Panel: Recent Labs  Lab 12/24/19 1424 12/24/19 2030 12/25/19 0359  NA 141  --  139  K 5.4*  --  4.5  CL 100  --  102  CO2 26  --  30  GLUCOSE 110*  --  94  BUN 23  --  19  CREATININE 1.52* 1.51* 1.29*  CALCIUM 9.9  --  9.4   GFR: Estimated Creatinine Clearance:  56.8 mL/min (A) (by C-G formula based on SCr of 1.29 mg/dL (H)). Liver Function Tests: Recent Labs  Lab 12/24/19 1424  AST 23  ALT 13  ALKPHOS 64  BILITOT 0.8  PROT 7.3  ALBUMIN 3.7   No results for input(s): LIPASE, AMYLASE in the last 168 hours. No results for input(s): AMMONIA in the last 168 hours. Coagulation Profile: Recent Labs  Lab 12/24/19 1424  INR 1.0   Cardiac Enzymes: No results for input(s): CKTOTAL, CKMB, CKMBINDEX, TROPONINI in the last 168 hours. BNP (last 3 results) No results for input(s): PROBNP in the last 8760 hours. HbA1C: No results for input(s): HGBA1C in the last 72 hours. CBG: No results for input(s): GLUCAP in the last 168 hours. Lipid Profile: No results for input(s): CHOL, HDL, LDLCALC, TRIG, CHOLHDL, LDLDIRECT in the last 72 hours. Thyroid Function Tests: No results for input(s): TSH, T4TOTAL, FREET4, T3FREE, THYROIDAB in the last 72 hours. Anemia Panel: No results for input(s): VITAMINB12, FOLATE, FERRITIN, TIBC, IRON, RETICCTPCT in the last 72 hours. Urine analysis:    Component Value Date/Time   COLORURINE YELLOW 12/24/2019 1427   APPEARANCEUR CLEAR 12/24/2019 1427   LABSPEC 1.017 12/24/2019 1427   PHURINE 6.0 12/24/2019 1427   GLUCOSEU NEGATIVE 12/24/2019 1427   HGBUR NEGATIVE 12/24/2019 1427   [place] 12/24/2019 1427   KETONESUR NEGATIVE 12/24/2019 1427   PROTEINUR NEGATIVE 12/24/2019 1427   UROBILINOGEN 0.2 02/07/2010 1547   NITRITE NEGATIVE 12/24/2019 1427   LEUKOCYTESUR SMALL (A) 12/24/2019 1427   Sepsis Labs: @LABRCNTIP (procalcitonin:4,lacticidven:4)  ) Recent Results (from the past 240 hour(s))  Blood Culture (routine x 2)     Status: None (Preliminary result)   Collection Time: 12/24/19  2:24 PM   Specimen: BLOOD  Result Value Ref Range Status   Specimen Description BLOOD BLOOD RIGHT FOREARM  Final   Special Requests   Final    BOTTLES DRAWN AEROBIC AND ANAEROBIC Blood Culture adequate volume    Culture   Final    NO GROWTH < 12 HOURS Performed at [facility], [place] [address] [place], [place] [address]    Report Status PENDING  Incomplete  SARS Coronavirus 2 by RT PCR (hospital order, performed in [facility] hospital lab) Nasopharyngeal Nasopharyngeal Swab     Status: None   Collection Time: 12/24/19  2:25 PM   Specimen: Nasopharyngeal Swab  Result Value Ref Range Status   SARS Coronavirus 2 NEGATIVE NEGATIVE Final    Comment: (NOTE) SARS-CoV-2 target nucleic acids are NOT DETECTED. The SARS-CoV-2 RNA is generally detectable in upper and lower respiratory specimens during the acute phase of infection. The lowest concentration of SARS-CoV-2 viral copies this assay can detect is 250 copies / mL. A negative result does not preclude SARS-CoV-2 infection and should not be used as the sole basis for treatment or other patient management decisions.  A negative result may occur with improper specimen collection / handling, submission of specimen other than nasopharyngeal swab, presence of viral mutation(s) within the areas targeted  by this assay, and inadequate number of viral copies (<250 copies / mL). A negative result must be combined with clinical observations, patient history, and epidemiological information. Fact Sheet for Patients:   StrictlyIdeas.no Fact Sheet for Healthcare Providers: BankingDealers.co.za This test is not yet approved or cleared  by the [place] FDA and has been authorized for detection and/or diagnosis of SARS-CoV-2 by FDA under an Emergency Use Authorization (EUA).  This EUA will remain in effect (meaning this test can be used) for the duration of the COVID-19 declaration under Section 564(b)(1) of the Act, 21 U.S.C. section 360bbb-3(b)(1), unless the authorization is terminated or revoked sooner. Performed at [facility], [place] [address], [place], [place] [address]   Blood Culture  (routine x 2)     Status: None (Preliminary result)   Collection Time: 12/24/19  2:29 PM   Specimen: BLOOD  Result Value Ref Range Status   Specimen Description BLOOD LEFT ANTECUBITAL  Final   Special Requests   Final    BOTTLES DRAWN AEROBIC AND ANAEROBIC Blood Culture adequate volume   Culture   Final    NO GROWTH < 12 HOURS Performed at [facility], [place] [address], [place], [place] [address]    Report Status PENDING  Incomplete  MRSA PCR Screening     Status: None   Collection Time: 12/24/19  8:32 PM   Specimen: Nasal Mucosa; Nasopharyngeal  Result Value Ref Range Status   MRSA by PCR NEGATIVE NEGATIVE Final    Comment:        The GeneXpert MRSA Assay (FDA approved for NASAL specimens only), is one component of a comprehensive MRSA colonization surveillance program. It is not intended to diagnose MRSA infection nor to guide or monitor treatment for MRSA infections. Performed at [facility], [place] [address] [place], [place] [address]          Radiology Studies: CT Head Wo Contrast  Result Date: 12/24/2019 CLINICAL DATA:  Altered level of consciousness, diaphoresis EXAM: CT HEAD WITHOUT CONTRAST TECHNIQUE: Contiguous axial images were obtained from the base of the skull through the vertex without intravenous contrast. COMPARISON:  04/10/2019 FINDINGS: Brain: No acute infarct or hemorrhage. Chronic ischemic changes bilateral cerebellar hemispheres, stable. Lateral ventricles and midline structures are unremarkable. No acute extra-axial fluid collections. No mass effect. Vascular: No hyperdense vessel or unexpected calcification. Skull: Normal. Negative for fracture or focal lesion. Sinuses/Orbits: No acute finding. Other: None. IMPRESSION: 1. Stable head CT, no acute process. Electronically Signed   By: Randa Ngo M.D.   On: 12/24/2019 16:30   DG Chest Port 1 View  Result Date: 12/24/2019 CLINICAL DATA:  Pneumonia, history of Alzheimer's disease,  hypertension, type II diabetes mellitus EXAM: PORTABLE CHEST 1 VIEW COMPARISON:  Portable exam 1443 hours compared to 06/18/2019 FINDINGS: Normal heart size, mediastinal contours, and pulmonary vascularity. Lungs clear. No pleural effusion or pneumothorax. Bones unremarkable. IMPRESSION: No acute abnormalities. Electronically Signed   By: Lavonia Dana M.D.   On: 12/24/2019 15:08        Scheduled Meds: . divalproex  500 mg Oral Q12H  . donepezil  10 mg Oral QHS  . DULoxetine  30 mg Oral Daily  . enoxaparin (LOVENOX) injection  40 mg Subcutaneous Q24H  . melatonin  3 mg Oral QHS  . memantine  10 mg Oral BID  . PARoxetine  12.5 mg Oral Daily  . sodium zirconium cyclosilicate  5 g Oral TID  . tamsulosin  0.4  mg Oral QPC breakfast   Continuous Infusions: . sodium chloride 75 mL/hr at 12/25/19 0901  . cefTRIAXone (ROCEPHIN)  IV       LOS: 1 day    Time spent: 75min  Domenic Polite, MD [facility]  12/25/2019, 10:55 AM

## 2019-12-25 NOTE — Progress Notes (Signed)
Physical Therapy Evaluation Patient Details Name: Tony [name] MRN: [id number] DOB: Dec 07, 1950 Today's Date: 12/25/2019   History of Present Illness   Tony [name] is a 69 y.o. male with medical history significant of Alzheimer's dementia, chronic kidney disease, type 2 diabetes mellitus, hypertension, seizures, and BPH.  He has been in a nursing home since last December, apparently had pneumonia and sepsis due to urinary tract infection.  Clinical Impression  Pt admitted with/for sepsis due to UTI.  Pt has stiff painful knees/legs making it difficult to mobilize him, needing mod/max assist.  Pt currently limited functionally due to the problems listed. ( See problems list.)   Pt will benefit from PT to maximize function and safety in order to get ready for next venue listed below.     Follow Up Recommendations SNF    Equipment Recommendations  Other (comment)(TBA at SNF)    Recommendations for Other Services       Precautions / Restrictions Precautions Precautions: Fall      Mobility  Bed Mobility Overal bed mobility: Needs Assistance Bed Mobility: Supine to Sit     Supine to sit: Mod assist     General bed mobility comments: cues v/t for direction.  truncal assist to come forward.  Pt assists with scooting,   Transfers Overall transfer level: Needs assistance   Transfers: Sit to/from Stand;Stand Pivot Transfers Sit to Stand: Max assist Stand pivot transfers: Max assist       General transfer comment: pt is painful and anxious standing with heavy posterior lean.  Ambulation/Gait                Stairs            Wheelchair Mobility    Modified Rankin (Stroke Patients Only)       Balance Overall balance assessment: Needs assistance Sitting-balance support: Bilateral upper extremity supported;Feet supported Sitting balance-Leahy Scale: Poor Sitting balance - Comments: heavy list posteriorly, can sit for a short time with bil UE's before falling  slowly backward.   Standing balance support: During functional activity Standing balance-Leahy Scale: Poor Standing balance comment: still lists posteriorly needing significant foward assist  x 4 standing trials.                             Pertinent Vitals/Pain Pain Assessment: Faces Faces Pain Scale: Hurts even more Pain Location: bil knees Pain Descriptors / Indicators: Aching;Discomfort;Grimacing;Guarding Pain Intervention(s): Monitored during session;Limited activity within patient's tolerance    Home Living Family/patient expects to be discharged to:: Skilled nursing facility                      Prior Function Level of Independence: Needs assistance   Gait / Transfers Assistance Needed: pt's responses are suspect, but he states he is mostly in a w/c at the nursing facility.  He states he can walk with a walker and assist, but again this is suspect.  ADL's / Homemaking Assistance Needed: pt unable to tell me more than he gets help.        Hand Dominance        Extremity/Trunk Assessment   Upper Extremity Assessment Upper Extremity Assessment: Overall WFL for tasks assessed    Lower Extremity Assessment Lower Extremity Assessment: Generalized weakness(R>L stiffness and pain with MMT/movement of LE's)       Communication   Communication: No difficulties  Cognition Arousal/Alertness: Awake/alert Behavior During Therapy: [facility]  for tasks assessed/performed;Anxious Overall Cognitive Status: History of cognitive impairments - at baseline                                        General Comments      Exercises Other Exercises Other Exercises: warm up HIP/KNEE AAROM to decrease stiffness/pain prior to mobility   Assessment/Plan    PT Assessment Patient needs continued PT services  PT Problem List Decreased strength;Decreased activity tolerance;Decreased balance;Decreased mobility;Decreased cognition;Pain       PT Treatment  Interventions DME instruction;Gait training;Functional mobility training;Therapeutic activities;Balance training;Patient/family education;Therapeutic exercise    PT Goals (Current goals can be found in the Care Plan section)  Acute Rehab PT Goals PT Goal Formulation: Patient unable to participate in goal setting Time For Goal Achievement: 01/08/20 Potential to Achieve Goals: Fair    Frequency Min 2X/week   Barriers to discharge        Co-evaluation               AM-PAC PT "6 Clicks" Mobility  Outcome Measure Help needed turning from your back to your side while in a flat bed without using bedrails?: A Lot Help needed moving from lying on your back to sitting on the side of a flat bed without using bedrails?: A Lot Help needed moving to and from a bed to a chair (including a wheelchair)?: Total Help needed standing up from a chair using your arms (e.g., wheelchair or bedside chair)?: Total Help needed to walk in hospital room?: Total Help needed climbing 3-5 steps with a railing? : Total 6 Click Score: 8    End of Session   Activity Tolerance: Patient limited by pain(and anxiety) Patient left: in chair;with call bell/phone within reach;with chair alarm set;Other (comment)(on lift pad)   PT Visit Diagnosis: Unsteadiness on feet (R26.81);Muscle weakness (generalized) (M62.81);Difficulty in walking, not elsewhere classified (R26.2)    Time: [id number] PT Time Calculation (min) (ACUTE ONLY): 34 min   Charges:   PT Evaluation $PT Eval Moderate Complexity: 1 Mod PT Treatments $Therapeutic Activity: 8-22 mins        12/25/2019  Ginger Carne., PT Acute Rehabilitation Services [phone]  (pager) [phone]  (office)  Tony [name] 12/25/2019, 10:13 AM

## 2019-12-26 LAB — CBC
HCT: 38.6 % — ABNORMAL LOW (ref 39.0–52.0)
Hemoglobin: 12.7 g/dL — ABNORMAL LOW (ref 13.0–17.0)
MCH: 31.4 pg (ref 26.0–34.0)
MCHC: 32.9 g/dL (ref 30.0–36.0)
MCV: 95.3 fL (ref 80.0–100.0)
Platelets: 199 10*3/uL (ref 150–400)
RBC: 4.05 MIL/uL — ABNORMAL LOW (ref 4.22–5.81)
RDW: 11.4 % — ABNORMAL LOW (ref 11.5–15.5)
WBC: 5.5 10*3/uL (ref 4.0–10.5)
nRBC: 0 % (ref 0.0–0.2)

## 2019-12-26 LAB — BASIC METABOLIC PANEL
Anion gap: 11 (ref 5–15)
BUN: 14 mg/dL (ref 8–23)
CO2: 28 mmol/L (ref 22–32)
Calcium: 9.4 mg/dL (ref 8.9–10.3)
Chloride: 99 mmol/L (ref 98–111)
Creatinine, Ser: 1.18 mg/dL (ref 0.61–1.24)
GFR calc Af Amer: >60 mL/min (ref >60)
GFR calc non Af Amer: >60 mL/min (ref >60)
Glucose, Bld: 176 mg/dL — ABNORMAL HIGH (ref 70–99)
Potassium: 4.3 mmol/L (ref 3.5–5.1)
Sodium: 138 mmol/L (ref 135–145)

## 2019-12-26 LAB — HEMOGLOBIN A1C
Hgb A1c MFr Bld: 5.3 % (ref 4.8–5.6)
Mean Plasma Glucose: 105.41 mg/dL

## 2019-12-26 MED ORDER — AMLODIPINE BESYLATE 5 MG PO TABS
5.0000 mg | ORAL_TABLET | Freq: Every day | ORAL | Status: DC
  Administered 2019-12-26 – 2019-12-28 (×3): 5 mg via ORAL
  Filled 2019-12-26 (×3): qty 1

## 2019-12-26 MED ORDER — ADULT MULTIVITAMIN W/MINERALS CH
1.0000 | ORAL_TABLET | Freq: Every day | ORAL | Status: DC
  Administered 2019-12-26 – 2019-12-28 (×3): 1 via ORAL
  Filled 2019-12-26 (×3): qty 1

## 2019-12-26 MED ORDER — ENSURE ENLIVE PO LIQD
237.0000 mL | Freq: Two times a day (BID) | ORAL | Status: DC
  Administered 2019-12-26 – 2019-12-28 (×6): 237 mL via ORAL

## 2019-12-26 NOTE — Progress Notes (Signed)
Initial Nutrition Assessment  RD working remotely.  DOCUMENTATION CODES:   Not applicable  INTERVENTION:   -Ensure Enlive po BID, each supplement provides 350 kcal and 20 grams of protein -Magic cup BID with meals, each supplement provides 290 kcal and 9 grams of protein -MVI with minerals daily  NUTRITION DIAGNOSIS:   Increased nutrient needs related to chronic illness(CKD) as evidenced by estimated needs.  GOAL:   Patient will meet greater than or equal to 90% of their needs  MONITOR:   PO intake, Supplement acceptance, Labs, Weight trends, Skin, I & O's  REASON FOR ASSESSMENT:   Malnutrition Screening Tool    ASSESSMENT:   Tony [name] is a 69 y.o. male with medical history significant of Alzheimer's dementia, chronic kidney disease, type 2 diabetes mellitus, hypertension, seizures, and BPH.  He has been in a nursing home since last December, apparently had pneumonia and sepsis due to urinary tract infection.Apparently today patient was found to have worsening mentation and diaphoresis.  Patient unable to give any detailed history due to his advanced cognitive impairment  Pt admitted with sepsis secondary to UTI, AKI, and metabolic encephalopathy.   Reviewed I/O's: +449 ml x 24 hours and +1.5 L since admission  UOP: 1.3 L x 24 hours  Attempted to speak with pt via phone, however, no answer. Per chart review, pt unable to provide accurate history secondary to cognitive impairment.   Reviewed meal completion records; PO 90%.   Reviewed wt hx; pt has experienced a 6% wt loss over the past 6 months. While this is not significant for time frame, it is concerning given pt's cognitive impairment and multiple co-morbidities.   Medications reviewed and include lovenox and melatonin.  Labs reviewed.     Diet Order:   Diet Order            Diet Heart Room service appropriate? Yes; Fluid consistency: Thin  Diet effective now              EDUCATION NEEDS:   No  education needs have been identified at this time  Skin:  Skin Assessment: Skin Integrity Issues: Skin Integrity Issues:: Other (Comment) Other: MASD rt and lt groin  Last BM:  12/25/19  Height:   Ht Readings from Last 1 Encounters:  12/24/19 5\' 11"  (1.803 m)    Weight:   Wt Readings from Last 1 Encounters:  12/26/19 86.8 kg    Ideal Body Weight:  78.2 kg  BMI:  Body mass index is 26.69 kg/m.  Estimated Nutritional Needs:   Kcal:  2000-2200  Protein:  105-120 grams  Fluid:  > 2 L    Loistine Chance, RD, LDN, [place] Registered Dietitian II Certified Diabetes Care and Education Specialist Please refer to [facility] for RD and/or RD on-call/weekend/after hours pager

## 2019-12-26 NOTE — Evaluation (Signed)
Occupational Therapy Evaluation Patient Details Name: Tony [name] MRN: [id number] DOB: 08-27-1950 Today's Date: 12/26/2019    History of Present Illness Tony [name] is a 69 y.o. male with medical history significant of Alzheimer's dementia, chronic kidney disease, type 2 diabetes mellitus, hypertension, seizures, and BPH.  He has been in a nursing home since last December, apparently had pneumonia and sepsis due to urinary tract infection. Presenting with worsening mentation and diaphoresis, admitted for sepssis due to UTI.    Clinical Impression   Patient admitted for above, limited by problem list below including impaired cognition, decreased activity tolerance, and decreased safety awareness. Pt reports PTA requires some assist for ADLs and mobility, but limited historian.  Per chart from SNF [facility] (long term resident).  Patient currently requires setup to total assist for ADLs from bed level, rolling in bed with mod assist given multimodal cueing.  He demonstrates decreased safety awareness, attention to task, and overall awareness (as pt incontinent of BM without knowing so).  Believe he will benefit from further OT services while admitted and after dc at SNF level to return to PLOF and decrease burden of care with ADLs, mobility.  Will follow.     Follow Up Recommendations  SNF;Supervision/Assistance - 24 hour    Equipment Recommendations  Other (comment)(TBD at next venue of care )    Recommendations for Other Services       Precautions / Restrictions Precautions Precautions: Fall Restrictions Weight Bearing Restrictions: No      Mobility Bed Mobility Overal bed mobility: Needs Assistance Bed Mobility: Rolling Rolling: Mod assist(multimodal cues for hand placement and sequencing)            Transfers                 General transfer comment: deferred     Balance                                           ADL either performed or  assessed with clinical judgement   ADL Overall ADL's : Needs assistance/impaired Eating/Feeding: Supervision/ safety;Bed level   Grooming: Set up;Bed level;Supervision/safety;Wash/dry face Grooming Details (indicate cue type and reason): pt wash/dry face with supervision and set up Upper Body Bathing: Supervision/ safety;Bed level;Set up;Cueing for sequencing Upper Body Bathing Details (indicate cue type and reason): set up and supervision/verbal cues for assistance Lower Body Bathing: Bed level;Cueing for sequencing;Total assistance Lower Body Bathing Details (indicate cue type and reason): pt able to roll side to side to clean bottom after BM. multimodal cues for hand placement on rails and which side to roll to Upper Body Dressing : Set up;Supervision/safety;Cueing for sequencing;Bed level Upper Body Dressing Details (indicate cue type and reason): set up to don gown verbal cues for sequencing Lower Body Dressing: Bed level;Cueing for sequencing;Total assistance     Toilet Transfer Details (indicate cue type and reason): deferred  Toileting- Clothing Manipulation and Hygiene: +2 for physical assistance;Total assistance;Bed level Toileting - Clothing Manipulation Details (indicate cue type and reason): incontient of bowel with no awareness upon entry      Functional mobility during ADLs: Moderate assistance;Cueing for sequencing;Cueing for safety(limited to bed mobility) General ADL Comments: Pt able to complete ADLs at bed level with total to supervision and multimodal cues for safety and sequencing     Vision  Perception     Praxis      Pertinent Vitals/Pain Pain Assessment: No/denies pain     Hand Dominance Right   Extremity/Trunk Assessment Upper Extremity Assessment Upper Extremity Assessment: Overall WFL for tasks assessed   Lower Extremity Assessment Lower Extremity Assessment: Defer to PT evaluation   Cervical / Trunk Assessment Cervical / Trunk  Assessment: Normal   Communication Communication Communication: No difficulties   Cognition Arousal/Alertness: Awake/alert Behavior During Therapy: WFL for tasks assessed/performed Overall Cognitive Status: History of cognitive impairments - at baseline                                 General Comments: Hx of Alzheimer's dementia; Pt able to follow one step commands and answer questions with extended time for response.    General Comments  Pt had BM in bed and was unaware, RN notified     Exercises     Shoulder Instructions      Home Living Family/patient expects to be discharged to:: Skilled nursing facility                                        Prior Functioning/Environment Level of Independence: Needs assistance  Gait / Transfers Assistance Needed: limited historian but he needed assistance ADL's / Homemaking Assistance Needed: limited historian but he needed some assistance with all ADLs            OT Problem List: Decreased cognition;Decreased safety awareness;Decreased knowledge of use of DME or AE;Decreased knowledge of precautions;Decreased activity tolerance      OT Treatment/Interventions: Self-care/ADL training;Therapeutic exercise;DME and/or AE instruction;Therapeutic activities;Cognitive remediation/compensation;Patient/family education    OT Goals(Current goals can be found in the care plan section) Acute Rehab OT Goals Patient Stated Goal: none stated  Time For Goal Achievement: 01/09/20 Potential to Achieve Goals: Good ADL Goals Pt Will Perform Grooming: with set-up;sitting Pt Will Perform Upper Body Bathing: with set-up;sitting Pt Will Perform Lower Body Dressing: with mod assist;sit to/from stand;sitting/lateral leans Pt Will Transfer to Toilet: with mod assist;bedside commode;stand pivot transfer Additional ADL Goal #1: Patient will complete bed mobility with supervision and maintain sitting balance with close  supervision for 10 minutes as precursor to ADLS.  OT Frequency: Min 2X/week   Barriers to D/C:            Co-evaluation              AM-PAC OT "6 Clicks" Daily Activity     Outcome Measure Help from another person eating meals?: None Help from another person taking care of personal grooming?: A Little Help from another person toileting, which includes using toliet, bedpan, or urinal?: Total Help from another person bathing (including washing, rinsing, drying)?: A Lot Help from another person to put on and taking off regular upper body clothing?: A Little Help from another person to put on and taking off regular lower body clothing?: A Lot 6 Click Score: 15   End of Session Nurse Communication: Other (comment)(notified nurse about cleaning up pt)  Activity Tolerance: Patient tolerated treatment well Patient left: in bed;with bed alarm set;with call bell/phone within reach  OT Visit Diagnosis: Other symptoms and signs involving cognitive function                Time: [id number] OT Time Calculation (min): 28 min Charges:  OT  General Charges $OT Visit: 1 Visit OT Evaluation $OT Eval Moderate Complexity: 1 Mod OT Treatments $Self Care/Home Management : 8-22 mins  Jolaine Artist, OT Acute Rehabilitation Services Pager [phone] Office [phone]   Delight Stare 12/26/2019, 2:59 PM

## 2019-12-26 NOTE — Progress Notes (Signed)
PROGRESS NOTE    Tony [name]  [id number] DOB: 05/14/1951 DOA: 12/24/2019 PCP: No primary care provider on file.  Brief Narrative: Tony [name] is a 69 y.o. male with medical history significant of Alzheimer's dementia, chronic kidney disease, type 2 diabetes mellitus, hypertension, seizures, and BPH.  H/o TURP in 05/2019, has been in a nursing home since last NOv/December, apparently had pneumonia and sepsis due to urinary tract infection. On 5/27 patient was found to have worsening mentation and diaphoresis.  Patient unable to give any detailed history due to his advanced cognitive impairment.  All information obtained from medical record, apparently his brother gave the information that at his baseline [name] is able to hold conversation. ED Course: Patient was found altered, and dehydrated, positive leukocytosis and worsening kidney function.  Elevated lactic acid 2.1 He received 1 L of lactated Ringer's IV and IV ceftriaxone.  Assessment & Plan:  Sepsis due to urinary tract infection Metabolic encephalopathy -With acute kidney injury, lactic acidosis -mental status improving and back to baseline -stop IVF -Continue IV ceftriaxone, urine culture still pending -No issues with urinary retention at this time, continue Flomax -PT OT, DC to SNF tomorrow if stable   Acute kidney injury chronic kidney disease stage II with hyperkalemia and anion gap metabolic acidosis.   -Based creatinine 1.3,  -Creatinine improving with hydration, stop IV fluids today  Hypertension.   -BP stable, restart amlodipine  History of seizure disorder  -Continue Depakote    Alzheimer's disease/ depression.   -Continue paroxetine, memantine and donepezil per home regimen  BPH.  -Continue Flomax, no evidence of retention at this time, monitor  Debility History of arthritis, chronic leg left leg weakness -Reports using a wheelchair most of the time, walker sometimes, PT OT eval completed, recommended  discharge back to SNF  DVT prophylaxis: Lovenox CODE STATUS: Full code Family communication: Updated brother Trauis Gatchell today Disposition  Status is: Inpatient  Remains inpatient appropriate because: Sepsis, metabolic encephalopathy, UTI  Dispo: The patient is from:  SNF  Anticipated d/c is to: SNF  Anticipated d/c date is: 1 to 2 days  Patient currently is not medically stable to d/c.  Procedures:   Antimicrobials:    Subjective: -Feeling better overall, no events overnight, mild cognitive impairment  Objective: Vitals:   12/25/19 2004 12/25/19 2330 12/26/19 0532 12/26/19 0745  BP: (!) 150/82 (!) 125/103 130/68 134/78  Pulse: 67 64 60 72  Resp: 14 18 15 18   Temp: 98.5 F (36.9 C) 98.4 F (36.9 C) 98.3 F (36.8 C) 98.6 F (37 C)  TempSrc: Oral Oral Oral Oral  SpO2: 98% 96% 99% 98%  Weight:   86.8 kg   Height:        Intake/Output Summary (Last 24 hours) at 12/26/2019 1059 Last data filed at 12/26/2019 1000 Gross per 24 hour  Intake 1000 ml  Output 1325 ml  Net -325 ml   Filed Weights   12/24/19 2000 12/25/19 0418 12/26/19 0532  Weight: 84.6 kg 85.7 kg 86.8 kg    Examination:  General exam: Chronically ill elderly male, laying in bed, awake alert, oriented to self and partly to place only, cognitive deficits noted, no distress HEENT: No JVD CVS S1-S2 regular rhythm Lungs are clear Abdomen is soft, obese, nondistended, bowel sounds present Extremities with trace edema, mild left leg weakness and pain with flexion at the knee  Skin: No rashes on exposed skin Psychiatry: Poor insight and judgment    Data Reviewed:  CBC: Recent Labs  Lab 12/24/19 1424 12/24/19 2030 12/25/19 0359 12/26/19 0906  WBC 17.5* 16.5* 13.0* 5.5  NEUTROABS 13.6*  --   --   --   HGB 14.4 13.5 12.9* 12.7*  HCT 44.2 41.1 40.4 38.6*  MCV 96.1 96.7 98.1 95.3  PLT 237 250 185 [id number]   Basic Metabolic Panel: Recent Labs  Lab  12/24/19 1424 12/24/19 2030 12/25/19 0359 12/26/19 0906  NA 141  --  139 138  K 5.4*  --  4.5 4.3  CL 100  --  102 99  CO2 26  --  30 28  GLUCOSE 110*  --  94 176*  BUN 23  --  19 14  CREATININE 1.52* 1.51* 1.29* 1.18  CALCIUM 9.9  --  9.4 9.4   GFR: Estimated Creatinine Clearance: 62 mL/min (by C-G formula based on SCr of 1.18 mg/dL). Liver Function Tests: Recent Labs  Lab 12/24/19 1424  AST 23  ALT 13  ALKPHOS 64  BILITOT 0.8  PROT 7.3  ALBUMIN 3.7   No results for input(s): LIPASE, AMYLASE in the last 168 hours. No results for input(s): AMMONIA in the last 168 hours. Coagulation Profile: Recent Labs  Lab 12/24/19 1424  INR 1.0   Cardiac Enzymes: No results for input(s): CKTOTAL, CKMB, CKMBINDEX, TROPONINI in the last 168 hours. BNP (last 3 results) No results for input(s): PROBNP in the last 8760 hours. HbA1C: No results for input(s): HGBA1C in the last 72 hours. CBG: No results for input(s): GLUCAP in the last 168 hours. Lipid Profile: No results for input(s): CHOL, HDL, LDLCALC, TRIG, CHOLHDL, LDLDIRECT in the last 72 hours. Thyroid Function Tests: No results for input(s): TSH, T4TOTAL, FREET4, T3FREE, THYROIDAB in the last 72 hours. Anemia Panel: No results for input(s): VITAMINB12, FOLATE, FERRITIN, TIBC, IRON, RETICCTPCT in the last 72 hours. Urine analysis:    Component Value Date/Time   COLORURINE YELLOW 12/24/2019 1427   APPEARANCEUR CLEAR 12/24/2019 1427   LABSPEC 1.017 12/24/2019 1427   PHURINE 6.0 12/24/2019 1427   GLUCOSEU NEGATIVE 12/24/2019 1427   HGBUR NEGATIVE 12/24/2019 1427   [place] 12/24/2019 1427   KETONESUR NEGATIVE 12/24/2019 1427   PROTEINUR NEGATIVE 12/24/2019 1427   UROBILINOGEN 0.2 02/07/2010 1547   NITRITE NEGATIVE 12/24/2019 1427   LEUKOCYTESUR SMALL (A) 12/24/2019 1427   Sepsis Labs: @LABRCNTIP (procalcitonin:4,lacticidven:4)  ) Recent Results (from the past 240 hour(s))  Blood Culture (routine x 2)      Status: None (Preliminary result)   Collection Time: 12/24/19  2:24 PM   Specimen: BLOOD  Result Value Ref Range Status   Specimen Description BLOOD BLOOD RIGHT FOREARM  Final   Special Requests   Final    BOTTLES DRAWN AEROBIC AND ANAEROBIC Blood Culture adequate volume   Culture   Final    NO GROWTH 2 DAYS Performed at [facility], [place] [address] [place], [place] [address]    Report Status PENDING  Incomplete  SARS Coronavirus 2 by RT PCR (hospital order, performed in [facility] hospital lab) Nasopharyngeal Nasopharyngeal Swab     Status: None   Collection Time: 12/24/19  2:25 PM   Specimen: Nasopharyngeal Swab  Result Value Ref Range Status   SARS Coronavirus 2 NEGATIVE NEGATIVE Final    Comment: (NOTE) SARS-CoV-2 target nucleic acids are NOT DETECTED. The SARS-CoV-2 RNA is generally detectable in upper and lower respiratory specimens during the acute phase of infection. The lowest concentration of SARS-CoV-2 viral copies this assay can detect  is 250 copies / mL. A negative result does not preclude SARS-CoV-2 infection and should not be used as the sole basis for treatment or other patient management decisions.  A negative result may occur with improper specimen collection / handling, submission of specimen other than nasopharyngeal swab, presence of viral mutation(s) within the areas targeted by this assay, and inadequate number of viral copies (<250 copies / mL). A negative result must be combined with clinical observations, patient history, and epidemiological information. Fact Sheet for Patients:   StrictlyIdeas.no Fact Sheet for Healthcare Providers: BankingDealers.co.za This test is not yet approved or cleared  by the [place] FDA and has been authorized for detection and/or diagnosis of SARS-CoV-2 by FDA under an Emergency Use Authorization (EUA).  This EUA will remain in effect (meaning this test can be used)  for the duration of the COVID-19 declaration under Section 564(b)(1) of the Act, 21 U.S.C. section 360bbb-3(b)(1), unless the authorization is terminated or revoked sooner. Performed at [facility], [place] [address] [place], [place] [address]   Blood Culture (routine x 2)     Status: None (Preliminary result)   Collection Time: 12/24/19  2:29 PM   Specimen: BLOOD  Result Value Ref Range Status   Specimen Description BLOOD LEFT ANTECUBITAL  Final   Special Requests   Final    BOTTLES DRAWN AEROBIC AND ANAEROBIC Blood Culture adequate volume   Culture   Final    NO GROWTH 2 DAYS Performed at [facility], [place] [address], [place], [place] [address]    Report Status PENDING  Incomplete  Urine culture     Status: None (Preliminary result)   Collection Time: 12/24/19  3:00 PM   Specimen: In/Out Cath Urine  Result Value Ref Range Status   Specimen Description IN/OUT CATH URINE  Final   Special Requests NONE  Final   Culture   Final    CULTURE REINCUBATED FOR BETTER GROWTH Performed at [facility], [place] [address], [place], [place] [address]    Report Status PENDING  Incomplete  MRSA PCR Screening     Status: None   Collection Time: 12/24/19  8:32 PM   Specimen: Nasal Mucosa; Nasopharyngeal  Result Value Ref Range Status   MRSA by PCR NEGATIVE NEGATIVE Final    Comment:        The GeneXpert MRSA Assay (FDA approved for NASAL specimens only), is one component of a comprehensive MRSA colonization surveillance program. It is not intended to diagnose MRSA infection nor to guide or monitor treatment for MRSA infections. Performed at [facility], [place] [address], [place], [place] [address]          Radiology Studies: CT Head Wo Contrast  Result Date: 12/24/2019 CLINICAL DATA:  Altered level of consciousness, diaphoresis EXAM: CT HEAD WITHOUT CONTRAST TECHNIQUE: Contiguous axial images were obtained from the base of the skull through the vertex without  intravenous contrast. COMPARISON:  04/10/2019 FINDINGS: Brain: No acute infarct or hemorrhage. Chronic ischemic changes bilateral cerebellar hemispheres, stable. Lateral ventricles and midline structures are unremarkable. No acute extra-axial fluid collections. No mass effect. Vascular: No hyperdense vessel or unexpected calcification. Skull: Normal. Negative for fracture or focal lesion. Sinuses/Orbits: No acute finding. Other: None. IMPRESSION: 1. Stable head CT, no acute process. Electronically Signed   By: Randa Ngo M.D.   On: 12/24/2019 16:30   DG Chest Port 1 View  Result Date: 12/24/2019 CLINICAL DATA:  Pneumonia, history of Alzheimer's  disease, hypertension, type II diabetes mellitus EXAM: PORTABLE CHEST 1 VIEW COMPARISON:  Portable exam 1443 hours compared to 06/18/2019 FINDINGS: Normal heart size, mediastinal contours, and pulmonary vascularity. Lungs clear. No pleural effusion or pneumothorax. Bones unremarkable. IMPRESSION: No acute abnormalities. Electronically Signed   By: Lavonia Dana M.D.   On: 12/24/2019 15:08        Scheduled Meds: . divalproex  500 mg Oral Q12H  . donepezil  10 mg Oral QHS  . DULoxetine  30 mg Oral Daily  . enoxaparin (LOVENOX) injection  40 mg Subcutaneous Q24H  . feeding supplement (ENSURE ENLIVE)  237 mL Oral BID BM  . melatonin  3 mg Oral QHS  . memantine  10 mg Oral BID  . multivitamin with minerals  1 tablet Oral Daily  . PARoxetine  12.5 mg Oral Daily  . tamsulosin  0.4 mg Oral QPC breakfast   Continuous Infusions: . cefTRIAXone (ROCEPHIN)  IV 1 g (12/25/19 1437)     LOS: 2 days   Time spent: 62min  Domenic Polite, MD [facility]  12/26/2019, 10:59 AM

## 2019-12-27 LAB — SARS CORONAVIRUS 2 (TAT 6-24 HRS): SARS Coronavirus 2: NEGATIVE

## 2019-12-27 LAB — URINE CULTURE: Culture: >=100000 — AB

## 2019-12-27 MED ORDER — AMPICILLIN 500 MG PO CAPS
500.0000 mg | ORAL_CAPSULE | Freq: Three times a day (TID) | ORAL | Status: DC
  Administered 2019-12-27 – 2019-12-28 (×4): 500 mg via ORAL
  Filled 2019-12-27 (×5): qty 1

## 2019-12-27 MED ORDER — AMPICILLIN 250 MG PO CAPS: 250.0000 mg | ORAL_CAPSULE | Freq: Four times a day (QID) | ORAL | 0 refills | Status: DC

## 2019-12-27 MED ORDER — AMPICILLIN 500 MG PO CAPS: 500.0000 mg | ORAL_CAPSULE | Freq: Three times a day (TID) | ORAL | 0 refills | Status: AC

## 2019-12-27 MED ORDER — AMPICILLIN 250 MG PO CAPS
250.0000 mg | ORAL_CAPSULE | Freq: Four times a day (QID) | ORAL | Status: DC
  Filled 2019-12-27: qty 1

## 2019-12-27 NOTE — Discharge Summary (Addendum)
Physician Discharge Summary  Tony [name] [id number] DOB: 09-27-1950 DOA: 12/24/2019  PCP: No primary care provider on file.  Admit date: 12/24/2019 Discharge date: 12/28/2019  Time spent: 35 minutes  Recommendations for Outpatient Follow-up:  1. PCP in 1 week 2. SNF for rehabilitation/long-term care   Discharge Diagnoses:  Principal Problem:   Sepsis secondary to UTI [facility]) Active Problems:   Hypertension   Seizure disorder ([place])   Chronic kidney disease stage III   HLD (hyperlipidemia)   Dementia ([place])   Metabolic encephalopathy   Discharge Condition: Stable  Diet recommendation: Heart healthy  Filed Weights   12/25/19 0418 12/26/19 0532 12/27/19 0022  Weight: 85.7 kg 86.8 kg 85.8 kg    History of present illness:  Tony [name] a 69 y.o.malewith medical history significant ofAlzheimer's dementia, chronic kidney disease, type 2 diabetes mellitus, hypertension, seizures,andBPH.H/o TURP in 05/2019, has been in a nursing home since last Nov/December,apparently had pneumonia and sepsis due to urinary tract infection. On 5/27patient was found to have worsening mentation and diaphoresis. Patient unable to give any detailed history due to his advanced cognitive impairment. Patient was found altered, and dehydrated, positive leukocytosis and worsening kidney function. Elevated lactic acid 2.1 He received 1 L of lactated Ringer'sIVand IV ceftriaxone  Hospital Course:   Sepsis due to urinary tract infection Metabolic encephalopathy -Associated with acute kidney injury, lactic acidosis -mental status improving and back to baseline -Urine culture grew Enterococcus faecalis which was sensitive to ampicillin, transition to oral ampicillin today -No issues with urinary retention at this time, continue Flomax -Clinically improved and stable, he will be discharged back to [facility] with 4 more days of antibiotics to complete a 7-day course  Acute kidney injury  chronic kidney disease stage II with hyperkalemia and anion gap metabolic acidosis.  -Based creatinine 1.3,  -Creatinine improving with hydration -IV fluids discontinued  Hypertension. -BP stable, restarted amlodipine  History of seizure disorder  -Continue Depakote   Alzheimer's disease/ depression.  -Continue paroxetine, memantine and donepezil per home regimen  BPH.  -Continue Flomax, no evidence of retention at this time, monitor  Debility History of arthritis, chronic leg left leg weakness and pain especially in the knees -Reports using a wheelchair most of the time, walker sometimes, PT OT eval completed, recommended discharge back to SNF   Discharge Exam: Vitals:   12/27/19 0700 12/27/19 1100  BP: (!) 144/76 124/65  Pulse: 71 66  Resp: 18 16  Temp: 98.2 F (36.8 C) 97.8 F (36.6 C)  SpO2: 100% 100%    General: Awake alert, oriented to self and partly to place, cognitive, attention and memory deficits noted Cardiovascular: S1-S2, regular rate rhythm Respiratory: Clear bilaterally  Discharge Instructions    Allergies as of 12/27/2019   No Known Allergies     Medication List    STOP taking these medications   HYDROcodone-acetaminophen 5-325 MG tablet Commonly known as: NORCO/VICODIN     TAKE these medications   acetaminophen 650 MG CR tablet Commonly known as: TYLENOL Take 650 mg by mouth every 8 (eight) hours as needed for pain. What changed: Another medication with the same name was removed. Continue taking this medication, and follow the directions you see here.   amLODipine 5 MG tablet Commonly known as: NORVASC Take 1 tablet (5 mg total) by mouth daily. What changed: Another medication with the same name was removed. Continue taking this medication, and follow the directions you see here.   ampicillin 500 MG capsule Commonly  known as: PRINCIPEN Take 1 capsule (500 mg total) by mouth every 8 (eight) hours for 4 days.   Biofreeze 4  % Gel Generic drug: Menthol (Topical Analgesic) Apply 1 application topically 2 (two) times daily as needed (for back pain).   cabergoline 0.5 MG tablet Commonly known as: DOSTINEX Take 0.25 mg by mouth once a week.   divalproex 125 MG capsule Commonly known as: DEPAKOTE SPRINKLE Take 500 mg by mouth in the morning, at noon, and at bedtime. What changed: Another medication with the same name was removed. Continue taking this medication, and follow the directions you see here.   donepezil 10 MG tablet Commonly known as: ARICEPT Take 10 mg by mouth at bedtime. What changed: Another medication with the same name was removed. Continue taking this medication, and follow the directions you see here.   DULoxetine 30 MG capsule Commonly known as: CYMBALTA Take 30 mg by mouth daily.   feeding supplement (PRO-STAT SUGAR FREE 64) Liqd Take 30 mLs by mouth 2 (two) times daily.   melatonin 3 MG Tabs tablet Take 3 mg by mouth at bedtime.   memantine 10 MG tablet Commonly known as: NAMENDA Take 10 mg by mouth 2 (two) times daily.   multivitamin with minerals Tabs tablet Take 1 tablet by mouth daily.   PARoxetine 12.5 MG 24 hr tablet Commonly known as: PAXIL-CR Take 12.5 mg by mouth daily.   tamsulosin 0.4 MG Caps capsule Commonly known as: FLOMAX Take 1 capsule (0.4 mg total) by mouth daily after breakfast.   Vitamin D (Ergocalciferol) 1.25 MG (50000 UNIT) Caps capsule Commonly known as: DRISDOL Take 50,000 Units by mouth every Friday.      No Known Allergies    The results of significant diagnostics from this hospitalization (including imaging, microbiology, ancillary and laboratory) are listed below for reference.    Significant Diagnostic Studies: CT Head Wo Contrast  Result Date: 12/24/2019 CLINICAL DATA:  Altered level of consciousness, diaphoresis EXAM: CT HEAD WITHOUT CONTRAST TECHNIQUE: Contiguous axial images were obtained from the base of the skull through the  vertex without intravenous contrast. COMPARISON:  04/10/2019 FINDINGS: Brain: No acute infarct or hemorrhage. Chronic ischemic changes bilateral cerebellar hemispheres, stable. Lateral ventricles and midline structures are unremarkable. No acute extra-axial fluid collections. No mass effect. Vascular: No hyperdense vessel or unexpected calcification. Skull: Normal. Negative for fracture or focal lesion. Sinuses/Orbits: No acute finding. Other: None. IMPRESSION: 1. Stable head CT, no acute process. Electronically Signed   By: Randa Ngo M.D.   On: 12/24/2019 16:30   DG Chest Port 1 View  Result Date: 12/24/2019 CLINICAL DATA:  Pneumonia, history of Alzheimer's disease, hypertension, type II diabetes mellitus EXAM: PORTABLE CHEST 1 VIEW COMPARISON:  Portable exam 1443 hours compared to 06/18/2019 FINDINGS: Normal heart size, mediastinal contours, and pulmonary vascularity. Lungs clear. No pleural effusion or pneumothorax. Bones unremarkable. IMPRESSION: No acute abnormalities. Electronically Signed   By: Lavonia Dana M.D.   On: 12/24/2019 15:08    Microbiology: Recent Results (from the past 240 hour(s))  Blood Culture (routine x 2)     Status: None (Preliminary result)   Collection Time: 12/24/19  2:24 PM   Specimen: BLOOD  Result Value Ref Range Status   Specimen Description BLOOD BLOOD RIGHT FOREARM  Final   Special Requests   Final    BOTTLES DRAWN AEROBIC AND ANAEROBIC Blood Culture adequate volume   Culture   Final    NO GROWTH 3 DAYS Performed at [facility]  Hospital Lab, [place] [address] [place], [place] [address]    Report Status PENDING  Incomplete  SARS Coronavirus 2 by RT PCR (hospital order, performed in [facility] hospital lab) Nasopharyngeal Nasopharyngeal Swab     Status: None   Collection Time: 12/24/19  2:25 PM   Specimen: Nasopharyngeal Swab  Result Value Ref Range Status   SARS Coronavirus 2 NEGATIVE NEGATIVE Final    Comment: (NOTE) SARS-CoV-2 target nucleic acids are NOT  DETECTED. The SARS-CoV-2 RNA is generally detectable in upper and lower respiratory specimens during the acute phase of infection. The lowest concentration of SARS-CoV-2 viral copies this assay can detect is 250 copies / mL. A negative result does not preclude SARS-CoV-2 infection and should not be used as the sole basis for treatment or other patient management decisions.  A negative result may occur with improper specimen collection / handling, submission of specimen other than nasopharyngeal swab, presence of viral mutation(s) within the areas targeted by this assay, and inadequate number of viral copies (<250 copies / mL). A negative result must be combined with clinical observations, patient history, and epidemiological information. Fact Sheet for Patients:   StrictlyIdeas.no Fact Sheet for Healthcare Providers: BankingDealers.co.za This test is not yet approved or cleared  by the [place] FDA and has been authorized for detection and/or diagnosis of SARS-CoV-2 by FDA under an Emergency Use Authorization (EUA).  This EUA will remain in effect (meaning this test can be used) for the duration of the COVID-19 declaration under Section 564(b)(1) of the Act, 21 U.S.C. section 360bbb-3(b)(1), unless the authorization is terminated or revoked sooner. Performed at [facility], [place] [address], [place], [place] [address]   Blood Culture (routine x 2)     Status: None (Preliminary result)   Collection Time: 12/24/19  2:29 PM   Specimen: BLOOD  Result Value Ref Range Status   Specimen Description BLOOD LEFT ANTECUBITAL  Final   Special Requests   Final    BOTTLES DRAWN AEROBIC AND ANAEROBIC Blood Culture adequate volume   Culture   Final    NO GROWTH 3 DAYS Performed at [facility], [place] [address] [place], [place] [address]    Report Status PENDING  Incomplete  Urine culture     Status: Abnormal   Collection Time:  12/24/19  3:00 PM   Specimen: In/Out Cath Urine  Result Value Ref Range Status   Specimen Description IN/OUT CATH URINE  Final   Special Requests   Final    NONE Performed at [facility], [place] [address] [place], [place] [address]    Culture >=100,000 COLONIES/mL ENTEROCOCCUS FAECALIS (A)  Final   Report Status 12/27/2019 FINAL  Final   Organism ID, Bacteria ENTEROCOCCUS FAECALIS (A)  Final      Susceptibility   Enterococcus faecalis - MIC*    AMPICILLIN <=2 SENSITIVE Sensitive     NITROFURANTOIN <=16 SENSITIVE Sensitive     VANCOMYCIN 1 SENSITIVE Sensitive     * >=100,000 COLONIES/mL ENTEROCOCCUS FAECALIS  MRSA PCR Screening     Status: None   Collection Time: 12/24/19  8:32 PM   Specimen: Nasal Mucosa; Nasopharyngeal  Result Value Ref Range Status   MRSA by PCR NEGATIVE NEGATIVE Final    Comment:        The GeneXpert MRSA Assay (FDA approved for NASAL specimens only), is one component of a comprehensive MRSA colonization surveillance program. It is not intended to diagnose MRSA infection nor to guide  or monitor treatment for MRSA infections. Performed at [facility], [place] [address], [place], [place] [address]      Labs: Basic Metabolic Panel: Recent Labs  Lab 12/24/19 1424 12/24/19 2030 12/25/19 0359 12/26/19 0906  NA 141  --  139 138  K 5.4*  --  4.5 4.3  CL 100  --  102 99  CO2 26  --  30 28  GLUCOSE 110*  --  94 176*  BUN 23  --  19 14  CREATININE 1.52* 1.51* 1.29* 1.18  CALCIUM 9.9  --  9.4 9.4   Liver Function Tests: Recent Labs  Lab 12/24/19 1424  AST 23  ALT 13  ALKPHOS 64  BILITOT 0.8  PROT 7.3  ALBUMIN 3.7   No results for input(s): LIPASE, AMYLASE in the last 168 hours. No results for input(s): AMMONIA in the last 168 hours. CBC: Recent Labs  Lab 12/24/19 1424 12/24/19 2030 12/25/19 0359 12/26/19 0906  WBC 17.5* 16.5* 13.0* 5.5  NEUTROABS 13.6*  --   --   --   HGB 14.4 13.5 12.9* 12.7*  HCT 44.2 41.1 40.4 38.6*   MCV 96.1 96.7 98.1 95.3  PLT 237 250 185 199   Cardiac Enzymes: No results for input(s): CKTOTAL, CKMB, CKMBINDEX, TROPONINI in the last 168 hours. BNP: BNP (last 3 results) No results for input(s): BNP in the last 8760 hours.  ProBNP (last 3 results) No results for input(s): PROBNP in the last 8760 hours.  CBG: No results for input(s): GLUCAP in the last 168 hours.   Signed:  Domenic Polite MD.  [facility] 12/27/2019, 11:48 AM

## 2019-12-27 NOTE — TOC Progression Note (Signed)
Transition of Care [facility]) - Progression Note    Patient Details  Name: Tony [name] MRN: [id number] Date of Birth: [birth year]  Transition of Care [facility]) CM/SW [place], [address] Phone Number: [phone] 12/27/2019, 2:31 PM  Clinical Narrative:     CSW reached out to [facility] to alert facility that patient was medically stable for discharge. Facility stated that they could accept patient tomorrow morning and he will need a new COVID. CSW reached out to [facility] about transportation for tomorrow and awaiting a return call.  CSW updated MD. Broadus John about patient's discharge status.  TOC team will continue to follow for discharge planning needs.  Expected Discharge Plan: [place] Barriers to Discharge: Continued Medical Work up  Expected Discharge Plan and Services Expected Discharge Plan: [place] arrangements for the past 2 months: [place] Expected Discharge Date: 12/27/19                                     Social Determinants of Health (SDOH) Interventions    Readmission Risk Interventions No flowsheet data found.

## 2019-12-28 NOTE — TOC Transition Note (Addendum)
Transition of Care [facility]) - CM/SW Discharge Note   Patient Details  Name: Tony [name] MRN: [id number] Date of Birth: September 04, 1950  Transition of Care [facility]) CM/SW Contact:  Vinie Sill, [place] Phone Number: 12/28/2019, 1:39 PM   Clinical Narrative:     Spoke with [facility]/Nicole [phone] verified [facility] is transporting patient. CSW gave her main floor number to call. CSW confirmed w/[facility] patient is ready for admission today.  Patient will DC to: [place] Date: 12/28/2019 Family Notified: [name],brother  Transport By: [facility]    Per MD patient is ready for discharge. RN, patient, and facility notified of DC. Discharge Summary sent to facility. RN given number for [phone], Room 416-D.  Clinical Social Worker signing off.  Thurmond Butts, MSW, [place] Clinical Social Worker   Final next level of care: Skilled Nursing Facility Barriers to Discharge: Barriers Resolved   Patient Goals and CMS Choice        Discharge Placement                Patient to be transferred to facility by: [facility] Name of family member notified: [name],brother Patient and family notified of of transfer: 12/28/19  Discharge Plan and Services                                     Social Determinants of Health (SDOH) Interventions     Readmission Risk Interventions No flowsheet data found.

## 2019-12-29 LAB — CULTURE, BLOOD (ROUTINE X 2)
Culture: NO GROWTH
Culture: NO GROWTH
Special Requests: ADEQUATE
Special Requests: ADEQUATE

## 2020-07-01 ENCOUNTER — Ambulatory Visit: Payer: Self-pay | Admitting: Podiatry

## 2020-07-15 ENCOUNTER — Ambulatory Visit: Payer: Self-pay | Admitting: Podiatry

## 2020-07-21 ENCOUNTER — Ambulatory Visit (INDEPENDENT_AMBULATORY_CARE_PROVIDER_SITE_OTHER): Payer: Medicare (Managed Care)

## 2020-07-21 ENCOUNTER — Other Ambulatory Visit: Payer: Self-pay

## 2020-07-21 ENCOUNTER — Ambulatory Visit (INDEPENDENT_AMBULATORY_CARE_PROVIDER_SITE_OTHER): Payer: Medicare (Managed Care) | Admitting: Podiatry

## 2020-07-21 DIAGNOSIS — M869 Osteomyelitis, unspecified: Secondary | ICD-10-CM

## 2020-07-21 DIAGNOSIS — L97514 Non-pressure chronic ulcer of other part of right foot with necrosis of bone: Secondary | ICD-10-CM | POA: Diagnosis not present

## 2020-07-21 DIAGNOSIS — B351 Tinea unguium: Secondary | ICD-10-CM

## 2020-07-21 DIAGNOSIS — L601 Onycholysis: Secondary | ICD-10-CM | POA: Diagnosis not present

## 2020-07-21 DIAGNOSIS — I739 Peripheral vascular disease, unspecified: Secondary | ICD-10-CM | POA: Diagnosis not present

## 2020-07-21 MED ORDER — MUPIROCIN 2 % EX OINT
1.0000 | TOPICAL_OINTMENT | Freq: Two times a day (BID) | CUTANEOUS | 2 refills | Status: DC
Start: 2020-07-21 — End: 2020-07-21

## 2020-07-21 MED ORDER — MUPIROCIN 2 % EX OINT: 1.0000 "application " | TOPICAL_OINTMENT | Freq: Two times a day (BID) | CUTANEOUS | 2 refills | Status: AC

## 2020-07-21 NOTE — Progress Notes (Signed)
  Subjective:  Patient ID: Tony [name], male    DOB: 1951/02/03,  MRN: [id number]  Chief Complaint  Patient presents with  . Nail Problem    Pt is a resident at [facility] [name] has stated that he had some type of procedure done 2 months ago and has been having someone come out and do skin care to his right hallux but stated his toe is unhealthy looking.    69 y.o. male presents with Tony above complaint. History confirmed with patient.  Here with [name] today who is a Marine scientist at [employer].  Tony toe is painful  Objective:  Physical Exam: warm, good capillary refill, nonpalpable but dopplerable and triphasic DP and PT pulses and normal sensory exam.  Right hallux nail is loose, beneath this is a full-thickness ulceration that probes to bone   Radiographs: X-ray of Tony right foot: Osteolysis of Tony distal medial tuft of Tony distal phalanx Assessment:   1. Chronic ulcer of great toe of right foot with necrosis of bone ([place])   2. Peripheral arterial disease ([place])   3. Onychomycosis   4. Osteomyelitis of great toe of right foot ([place])      Plan:  Patient was evaluated and treated and all questions answered.   Following digital block with 1.5 cc each of 0.5% Marcaine plain and 2% Xylocaine plain I performed a total nail avulsion after sterile prep.  This revealed a full-thickness ulceration which probes directly to Tony distal tuft of Tony distal phalanx.  This is highly suspicious for osteomyelitis.  I would like to order an MRI to evaluate this.  I discussed with him and [name] that there is a good chance that he may require partial toe amputation.  His pulses were also nonpalpable although he has good triphasic waveforms.  I think we should order ABI and TBI to evaluate this as a baseline.  In Tony interim, continue local wound care with mupirocin ointment and bandaging  Return in about 2 weeks (around 08/04/2020).

## 2020-07-21 NOTE — Patient Instructions (Signed)
Soak Instructions    THE DAY AFTER THE PROCEDURE  Place 1/4 cup of epsom salts (or betadine, or white vinegar) in a quart of warm tap water.  Submerge your foot or feet with outer bandage intact for the initial soak; this will allow the bandage to become moist and wet for easy lift off.  Once you remove your bandage, continue to soak in the solution for 20 minutes.  This soak should be done twice a day.  Next, remove your foot or feet from solution, blot dry the affected area and cover.  You may use a band aid large enough to cover the area or use gauze and tape.  Apply other medications to the area as directed by the doctor such as polysporin neosporin.  IF YOUR SKIN BECOMES IRRITATED WHILE USING THESE INSTRUCTIONS, IT IS OKAY TO SWITCH TO  WHITE VINEGAR AND WATER. Or you may use antibacterial soap and water to keep the toe clean  Monitor for any signs/symptoms of infection. Call the office immediately if any occur or go directly to the emergency room. Call with any questions/concerns.    Apply mupirocin ointment daily to the ulcer with a bandage    Call to schedule an appointment for vascular testing at:  [phone] [address] [place], [place] [address]

## 2020-07-28 ENCOUNTER — Encounter (HOSPITAL_COMMUNITY): Payer: Medicare (Managed Care)

## 2020-08-03 ENCOUNTER — Encounter (HOSPITAL_COMMUNITY): Payer: Medicare (Managed Care)

## 2020-08-05 ENCOUNTER — Other Ambulatory Visit: Payer: Self-pay

## 2020-08-05 ENCOUNTER — Ambulatory Visit (INDEPENDENT_AMBULATORY_CARE_PROVIDER_SITE_OTHER): Payer: Medicare (Managed Care) | Admitting: Podiatry

## 2020-08-05 DIAGNOSIS — L97514 Non-pressure chronic ulcer of other part of right foot with necrosis of bone: Secondary | ICD-10-CM | POA: Diagnosis not present

## 2020-08-05 DIAGNOSIS — I739 Peripheral vascular disease, unspecified: Secondary | ICD-10-CM | POA: Diagnosis not present

## 2020-08-09 ENCOUNTER — Ambulatory Visit (HOSPITAL_COMMUNITY)
Admission: RE | Admit: 2020-08-09 | Discharge: 2020-08-09 | Disposition: A | Discharge status: Home / Self Care | Payer: Medicare (Managed Care) | Source: Ambulatory Visit | Attending: Podiatry | Admitting: Podiatry

## 2020-08-09 ENCOUNTER — Encounter: Payer: Self-pay | Admitting: Podiatry

## 2020-08-09 ENCOUNTER — Other Ambulatory Visit: Payer: Self-pay

## 2020-08-09 DIAGNOSIS — I739 Peripheral vascular disease, unspecified: Secondary | ICD-10-CM | POA: Diagnosis not present

## 2020-08-09 NOTE — Progress Notes (Signed)
  Subjective:  Patient ID: Tony [name], male    DOB: 11/16/1950,  MRN: [id number]  Chief Complaint  Patient presents with  . Foot Ulcer    PT stated that he is doing well    70 y.o. male presents with the above complaint. History confirmed with patient.  Denies any other acute complaints.  He has been doing dressing changes he is still awaiting his MRI and ABI PVR testing.  Objective:  Physical Exam: warm, good capillary refill, nonpalpable but dopplerable and triphasic DP and PT pulses and normal sensory exam.  Nailbed has healed.  No further ulceration noted.  No probing to bone noted.   Radiographs: X-ray of the right foot: Osteolysis of the distal medial tuft of the distal phalanx Assessment:   1. Peripheral arterial disease ([place])   2. Chronic ulcer of great toe of right foot with necrosis of bone ([place])      Plan:  Patient was evaluated and treated and all questions answered.  Right hallux ulceration that has clinically healed. -Given that ulceration at the distal tip of the hallux has healed clinically we will still await the further clinical evaluation to make sure that there is no osteomyelitis in the distal tuft of the distal phalanx. -Awaiting ABIs PVRs and MRI for further evaluation of the great toe -Clinically healing well.  Patient can keep it covered with a Betadine wet-to-dry dry dressing Follow-up with Dr. Sherryle Lis for after the imaging  No follow-ups on file.

## 2020-08-11 ENCOUNTER — Other Ambulatory Visit: Payer: Medicare (Managed Care)

## 2020-08-25 ENCOUNTER — Ambulatory Visit: Payer: Medicare (Managed Care) | Admitting: Podiatry

## 2020-10-03 ENCOUNTER — Emergency Department (HOSPITAL_COMMUNITY): Payer: Medicare (Managed Care)

## 2020-10-03 ENCOUNTER — Other Ambulatory Visit: Payer: Self-pay

## 2020-10-03 ENCOUNTER — Encounter (HOSPITAL_COMMUNITY): Payer: Self-pay | Admitting: *Deleted

## 2020-10-03 ENCOUNTER — Inpatient Hospital Stay (HOSPITAL_COMMUNITY)
Admission: EM | Admit: 2020-10-03 | Discharge: 2020-10-28 | DRG: 853 | Disposition: E | Payer: Medicare (Managed Care) | Attending: Critical Care Medicine | Admitting: Critical Care Medicine

## 2020-10-03 DIAGNOSIS — F05 Delirium due to known physiological condition: Secondary | ICD-10-CM | POA: Diagnosis present

## 2020-10-03 DIAGNOSIS — J9601 Acute respiratory failure with hypoxia: Secondary | ICD-10-CM | POA: Diagnosis not present

## 2020-10-03 DIAGNOSIS — N170 Acute kidney failure with tubular necrosis: Secondary | ICD-10-CM | POA: Diagnosis not present

## 2020-10-03 DIAGNOSIS — M869 Osteomyelitis, unspecified: Secondary | ICD-10-CM

## 2020-10-03 DIAGNOSIS — R6521 Severe sepsis with septic shock: Secondary | ICD-10-CM | POA: Diagnosis not present

## 2020-10-03 DIAGNOSIS — A419 Sepsis, unspecified organism: Secondary | ICD-10-CM | POA: Diagnosis present

## 2020-10-03 DIAGNOSIS — M60073 Infective myositis, right foot: Secondary | ICD-10-CM | POA: Diagnosis present

## 2020-10-03 DIAGNOSIS — R401 Stupor: Secondary | ICD-10-CM | POA: Diagnosis not present

## 2020-10-03 DIAGNOSIS — R4182 Altered mental status, unspecified: Secondary | ICD-10-CM | POA: Diagnosis present

## 2020-10-03 DIAGNOSIS — Z66 Do not resuscitate: Secondary | ICD-10-CM | POA: Diagnosis not present

## 2020-10-03 DIAGNOSIS — J690 Pneumonitis due to inhalation of food and vomit: Secondary | ICD-10-CM | POA: Diagnosis present

## 2020-10-03 DIAGNOSIS — I959 Hypotension, unspecified: Secondary | ICD-10-CM | POA: Diagnosis not present

## 2020-10-03 DIAGNOSIS — R52 Pain, unspecified: Secondary | ICD-10-CM | POA: Diagnosis not present

## 2020-10-03 DIAGNOSIS — J9621 Acute and chronic respiratory failure with hypoxia: Secondary | ICD-10-CM

## 2020-10-03 DIAGNOSIS — Z20822 Contact with and (suspected) exposure to covid-19: Secondary | ICD-10-CM | POA: Diagnosis present

## 2020-10-03 DIAGNOSIS — E1169 Type 2 diabetes mellitus with other specified complication: Secondary | ICD-10-CM | POA: Diagnosis present

## 2020-10-03 DIAGNOSIS — I10 Essential (primary) hypertension: Secondary | ICD-10-CM | POA: Diagnosis present

## 2020-10-03 DIAGNOSIS — N401 Enlarged prostate with lower urinary tract symptoms: Secondary | ICD-10-CM | POA: Diagnosis not present

## 2020-10-03 DIAGNOSIS — N1832 Chronic kidney disease, stage 3b: Secondary | ICD-10-CM | POA: Diagnosis present

## 2020-10-03 DIAGNOSIS — R54 Age-related physical debility: Secondary | ICD-10-CM | POA: Diagnosis present

## 2020-10-03 DIAGNOSIS — N179 Acute kidney failure, unspecified: Secondary | ICD-10-CM | POA: Diagnosis not present

## 2020-10-03 DIAGNOSIS — R569 Unspecified convulsions: Secondary | ICD-10-CM | POA: Diagnosis not present

## 2020-10-03 DIAGNOSIS — Z8673 Personal history of transient ischemic attack (TIA), and cerebral infarction without residual deficits: Secondary | ICD-10-CM

## 2020-10-03 DIAGNOSIS — E11621 Type 2 diabetes mellitus with foot ulcer: Secondary | ICD-10-CM | POA: Diagnosis present

## 2020-10-03 DIAGNOSIS — M86671 Other chronic osteomyelitis, right ankle and foot: Secondary | ICD-10-CM | POA: Diagnosis present

## 2020-10-03 DIAGNOSIS — L97519 Non-pressure chronic ulcer of other part of right foot with unspecified severity: Secondary | ICD-10-CM

## 2020-10-03 DIAGNOSIS — R4189 Other symptoms and signs involving cognitive functions and awareness: Secondary | ICD-10-CM

## 2020-10-03 DIAGNOSIS — Z515 Encounter for palliative care: Secondary | ICD-10-CM

## 2020-10-03 DIAGNOSIS — B351 Tinea unguium: Secondary | ICD-10-CM | POA: Diagnosis present

## 2020-10-03 DIAGNOSIS — F0390 Unspecified dementia without behavioral disturbance: Secondary | ICD-10-CM | POA: Diagnosis present

## 2020-10-03 DIAGNOSIS — Z833 Family history of diabetes mellitus: Secondary | ICD-10-CM

## 2020-10-03 DIAGNOSIS — E11649 Type 2 diabetes mellitus with hypoglycemia without coma: Secondary | ICD-10-CM | POA: Diagnosis not present

## 2020-10-03 DIAGNOSIS — G40909 Epilepsy, unspecified, not intractable, without status epilepticus: Secondary | ICD-10-CM | POA: Diagnosis present

## 2020-10-03 DIAGNOSIS — I129 Hypertensive chronic kidney disease with stage 1 through stage 4 chronic kidney disease, or unspecified chronic kidney disease: Secondary | ICD-10-CM | POA: Diagnosis present

## 2020-10-03 DIAGNOSIS — E785 Hyperlipidemia, unspecified: Secondary | ICD-10-CM | POA: Diagnosis present

## 2020-10-03 DIAGNOSIS — M86171 Other acute osteomyelitis, right ankle and foot: Secondary | ICD-10-CM

## 2020-10-03 DIAGNOSIS — E1122 Type 2 diabetes mellitus with diabetic chronic kidney disease: Secondary | ICD-10-CM | POA: Diagnosis present

## 2020-10-03 DIAGNOSIS — G309 Alzheimer's disease, unspecified: Secondary | ICD-10-CM | POA: Diagnosis present

## 2020-10-03 DIAGNOSIS — N1831 Chronic kidney disease, stage 3a: Secondary | ICD-10-CM | POA: Diagnosis present

## 2020-10-03 DIAGNOSIS — R0609 Other forms of dyspnea: Secondary | ICD-10-CM | POA: Diagnosis not present

## 2020-10-03 DIAGNOSIS — F0280 Dementia in other diseases classified elsewhere without behavioral disturbance: Secondary | ICD-10-CM | POA: Diagnosis present

## 2020-10-03 DIAGNOSIS — R339 Retention of urine, unspecified: Secondary | ICD-10-CM | POA: Diagnosis not present

## 2020-10-03 DIAGNOSIS — R627 Adult failure to thrive: Secondary | ICD-10-CM | POA: Diagnosis not present

## 2020-10-03 DIAGNOSIS — D75839 Thrombocytosis, unspecified: Secondary | ICD-10-CM | POA: Diagnosis not present

## 2020-10-03 DIAGNOSIS — R6520 Severe sepsis without septic shock: Secondary | ICD-10-CM | POA: Diagnosis not present

## 2020-10-03 DIAGNOSIS — Z781 Physical restraint status: Secondary | ICD-10-CM

## 2020-10-03 DIAGNOSIS — T17990A Other foreign object in respiratory tract, part unspecified in causing asphyxiation, initial encounter: Secondary | ICD-10-CM | POA: Diagnosis not present

## 2020-10-03 DIAGNOSIS — E870 Hyperosmolality and hypernatremia: Secondary | ICD-10-CM | POA: Diagnosis present

## 2020-10-03 DIAGNOSIS — R400 Somnolence: Secondary | ICD-10-CM | POA: Diagnosis not present

## 2020-10-03 DIAGNOSIS — R451 Restlessness and agitation: Secondary | ICD-10-CM | POA: Diagnosis not present

## 2020-10-03 DIAGNOSIS — Z978 Presence of other specified devices: Secondary | ICD-10-CM

## 2020-10-03 DIAGNOSIS — M436 Torticollis: Secondary | ICD-10-CM | POA: Diagnosis not present

## 2020-10-03 DIAGNOSIS — E1159 Type 2 diabetes mellitus with other circulatory complications: Secondary | ICD-10-CM | POA: Diagnosis not present

## 2020-10-03 DIAGNOSIS — G9340 Encephalopathy, unspecified: Secondary | ICD-10-CM | POA: Diagnosis not present

## 2020-10-03 DIAGNOSIS — G9341 Metabolic encephalopathy: Secondary | ICD-10-CM | POA: Diagnosis present

## 2020-10-03 DIAGNOSIS — Z8249 Family history of ischemic heart disease and other diseases of the circulatory system: Secondary | ICD-10-CM

## 2020-10-03 DIAGNOSIS — L89152 Pressure ulcer of sacral region, stage 2: Secondary | ICD-10-CM | POA: Diagnosis present

## 2020-10-03 DIAGNOSIS — E1110 Type 2 diabetes mellitus with ketoacidosis without coma: Secondary | ICD-10-CM

## 2020-10-03 DIAGNOSIS — D631 Anemia in chronic kidney disease: Secondary | ICD-10-CM | POA: Diagnosis present

## 2020-10-03 DIAGNOSIS — R509 Fever, unspecified: Secondary | ICD-10-CM

## 2020-10-03 DIAGNOSIS — Z87440 Personal history of urinary (tract) infections: Secondary | ICD-10-CM

## 2020-10-03 DIAGNOSIS — J9690 Respiratory failure, unspecified, unspecified whether with hypoxia or hypercapnia: Secondary | ICD-10-CM

## 2020-10-03 DIAGNOSIS — Z9079 Acquired absence of other genital organ(s): Secondary | ICD-10-CM

## 2020-10-03 DIAGNOSIS — K559 Vascular disorder of intestine, unspecified: Secondary | ICD-10-CM | POA: Diagnosis not present

## 2020-10-03 DIAGNOSIS — E878 Other disorders of electrolyte and fluid balance, not elsewhere classified: Secondary | ICD-10-CM | POA: Diagnosis present

## 2020-10-03 DIAGNOSIS — Z9911 Dependence on respirator [ventilator] status: Secondary | ICD-10-CM | POA: Diagnosis not present

## 2020-10-03 DIAGNOSIS — Z79899 Other long term (current) drug therapy: Secondary | ICD-10-CM

## 2020-10-03 LAB — URINALYSIS, ROUTINE W REFLEX MICROSCOPIC
Bacteria, UA: NONE SEEN
Bilirubin Urine: NEGATIVE
Glucose, UA: >=500 mg/dL — AB
Ketones, ur: 5 mg/dL — AB
Nitrite: NEGATIVE
Protein, ur: 100 mg/dL — AB
Specific Gravity, Urine: 1.018 (ref 1.005–1.030)
WBC, UA: >50 WBC/hpf — ABNORMAL HIGH (ref 0–5)
pH: 5 (ref 5.0–8.0)

## 2020-10-03 LAB — CBG MONITORING, ED
Glucose-Capillary: 529 mg/dL (ref 70–99)
Glucose-Capillary: 441 mg/dL — ABNORMAL HIGH (ref 70–99)
Glucose-Capillary: 451 mg/dL — ABNORMAL HIGH (ref 70–99)
Glucose-Capillary: 344 mg/dL — ABNORMAL HIGH (ref 70–99)
Glucose-Capillary: 332 mg/dL — ABNORMAL HIGH (ref 70–99)

## 2020-10-03 LAB — BASIC METABOLIC PANEL
Anion gap: 15 (ref 5–15)
BUN: 47 mg/dL — ABNORMAL HIGH (ref 8–23)
CO2: 25 mmol/L (ref 22–32)
Calcium: 9.9 mg/dL (ref 8.9–10.3)
Chloride: 109 mmol/L (ref 98–111)
Creatinine, Ser: 2.55 mg/dL — ABNORMAL HIGH (ref 0.61–1.24)
GFR, Estimated: 26 mL/min — ABNORMAL LOW (ref >60)
Glucose, Bld: 532 mg/dL (ref 70–99)
Potassium: 5 mmol/L (ref 3.5–5.1)
Sodium: 149 mmol/L — ABNORMAL HIGH (ref 135–145)

## 2020-10-03 LAB — COMPREHENSIVE METABOLIC PANEL
ALT: 12 U/L (ref 0–44)
AST: 23 U/L (ref 15–41)
Albumin: 3.1 g/dL — ABNORMAL LOW (ref 3.5–5.0)
Alkaline Phosphatase: 72 U/L (ref 38–126)
Anion gap: 20 — ABNORMAL HIGH (ref 5–15)
BUN: 52 mg/dL — ABNORMAL HIGH (ref 8–23)
CO2: 22 mmol/L (ref 22–32)
Calcium: 10.5 mg/dL — ABNORMAL HIGH (ref 8.9–10.3)
Chloride: 106 mmol/L (ref 98–111)
Creatinine, Ser: 2.93 mg/dL — ABNORMAL HIGH (ref 0.61–1.24)
GFR, Estimated: 22 mL/min — ABNORMAL LOW (ref >60)
Glucose, Bld: 605 mg/dL (ref 70–99)
Potassium: 5 mmol/L (ref 3.5–5.1)
Sodium: 148 mmol/L — ABNORMAL HIGH (ref 135–145)
Total Bilirubin: 1 mg/dL (ref 0.3–1.2)
Total Protein: 8.2 g/dL — ABNORMAL HIGH (ref 6.5–8.1)

## 2020-10-03 LAB — CBC WITH DIFFERENTIAL/PLATELET
Abs Immature Granulocytes: 0.19 10*3/uL — ABNORMAL HIGH (ref 0.00–0.07)
Basophils Absolute: 0.1 10*3/uL (ref 0.0–0.1)
Basophils Relative: 0 %
Eosinophils Absolute: 0 10*3/uL (ref 0.0–0.5)
Eosinophils Relative: 0 %
HCT: 46.2 % (ref 39.0–52.0)
Hemoglobin: 14.7 g/dL (ref 13.0–17.0)
Immature Granulocytes: 1 %
Lymphocytes Relative: 6 %
Lymphs Abs: 1.3 10*3/uL (ref 0.7–4.0)
MCH: 31.3 pg (ref 26.0–34.0)
MCHC: 31.8 g/dL (ref 30.0–36.0)
MCV: 98.3 fL (ref 80.0–100.0)
Monocytes Absolute: 1.2 10*3/uL — ABNORMAL HIGH (ref 0.1–1.0)
Monocytes Relative: 6 %
Neutro Abs: 18.3 10*3/uL — ABNORMAL HIGH (ref 1.7–7.7)
Neutrophils Relative %: 87 %
Platelets: 313 10*3/uL (ref 150–400)
RBC: 4.7 MIL/uL (ref 4.22–5.81)
RDW: 12.3 % (ref 11.5–15.5)
WBC: 21.1 10*3/uL — ABNORMAL HIGH (ref 4.0–10.5)
nRBC: 0 % (ref 0.0–0.2)

## 2020-10-03 LAB — GLUCOSE, CAPILLARY: Glucose-Capillary: 303 mg/dL — ABNORMAL HIGH (ref 70–99)

## 2020-10-03 LAB — BETA-HYDROXYBUTYRIC ACID: Beta-Hydroxybutyric Acid: 1.2 mmol/L — ABNORMAL HIGH (ref 0.05–0.27)

## 2020-10-03 LAB — LACTIC ACID, PLASMA
Lactic Acid, Venous: 5.3 mmol/L (ref 0.5–1.9)
Lactic Acid, Venous: 5.8 mmol/L (ref 0.5–1.9)

## 2020-10-03 LAB — RESP PANEL BY RT-PCR (FLU A&B, COVID) ARPGX2
Influenza A by PCR: NEGATIVE
Influenza B by PCR: NEGATIVE
SARS Coronavirus 2 by RT PCR: NEGATIVE

## 2020-10-03 LAB — HEMOGLOBIN A1C
Hgb A1c MFr Bld: 8.8 % — ABNORMAL HIGH (ref 4.8–5.6)
Mean Plasma Glucose: 205.86 mg/dL

## 2020-10-03 LAB — TROPONIN I (HIGH SENSITIVITY): Troponin I (High Sensitivity): 22 ng/L — ABNORMAL HIGH (ref <18)

## 2020-10-03 MED ORDER — DEXTROSE IN LACTATED RINGERS 5 % IV SOLN: INTRAVENOUS | Status: DC

## 2020-10-03 MED ORDER — AMLODIPINE BESYLATE 5 MG PO TABS: 2.5000 mg | ORAL_TABLET | Freq: Every day | ORAL | Status: DC

## 2020-10-03 MED ORDER — ADULT MULTIVITAMIN W/MINERALS CH: 1.0000 | ORAL_TABLET | Freq: Every day | ORAL | Status: DC

## 2020-10-03 MED ORDER — MUPIROCIN 2 % EX OINT
1.0000 "application " | TOPICAL_OINTMENT | Freq: Two times a day (BID) | CUTANEOUS | Status: DC
  Administered 2020-10-04 – 2020-10-18 (×28): 1 via TOPICAL
  Filled 2020-10-03 (×7): qty 22

## 2020-10-03 MED ORDER — SODIUM CHLORIDE 0.9 % IV SOLN: 2.0000 g | INTRAVENOUS | Status: DC

## 2020-10-03 MED ORDER — LACTATED RINGERS IV BOLUS
1000.0000 mL | Freq: Once | INTRAVENOUS | Status: AC
  Administered 2020-10-03: 1000 mL via INTRAVENOUS

## 2020-10-03 MED ORDER — LACTATED RINGERS IV BOLUS: 20.0000 mL/kg | Freq: Once | INTRAVENOUS | Status: DC

## 2020-10-03 MED ORDER — SODIUM CHLORIDE 0.9 % IV SOLN
2.0000 g | Freq: Once | INTRAVENOUS | Status: AC
  Administered 2020-10-03: 2 g via INTRAVENOUS
  Filled 2020-10-03: qty 2

## 2020-10-03 MED ORDER — DEXTROSE 50 % IV SOLN
0.0000 mL | INTRAVENOUS | Status: DC | PRN
  Administered 2020-10-15: 50 mL via INTRAVENOUS
  Administered 2020-10-15: 25 mL via INTRAVENOUS
  Filled 2020-10-03 (×2): qty 50

## 2020-10-03 MED ORDER — METRONIDAZOLE IN NACL 5-0.79 MG/ML-% IV SOLN
500.0000 mg | Freq: Once | INTRAVENOUS | Status: AC
  Administered 2020-10-03: 500 mg via INTRAVENOUS
  Filled 2020-10-03: qty 100

## 2020-10-03 MED ORDER — LACTATED RINGERS IV BOLUS
500.0000 mL | Freq: Once | INTRAVENOUS | Status: AC
  Administered 2020-10-03: 500 mL via INTRAVENOUS

## 2020-10-03 MED ORDER — MENTHOL (TOPICAL ANALGESIC) 4 % EX GEL: 1.0000 "application " | Freq: Two times a day (BID) | CUTANEOUS | Status: DC | PRN

## 2020-10-03 MED ORDER — ACETAMINOPHEN 650 MG RE SUPP
650.0000 mg | Freq: Once | RECTAL | Status: AC
  Administered 2020-10-03: 650 mg via RECTAL
  Filled 2020-10-03: qty 1

## 2020-10-03 MED ORDER — TAMSULOSIN HCL 0.4 MG PO CAPS: 0.4000 mg | ORAL_CAPSULE | Freq: Every day | ORAL | Status: DC

## 2020-10-03 MED ORDER — DIVALPROEX SODIUM 125 MG PO CSDR: 500.0000 mg | DELAYED_RELEASE_CAPSULE | Freq: Three times a day (TID) | ORAL | Status: DC

## 2020-10-03 MED ORDER — ONDANSETRON HCL 4 MG/2ML IJ SOLN
4.0000 mg | Freq: Four times a day (QID) | INTRAMUSCULAR | Status: DC | PRN
Start: 2020-10-03 — End: 2020-10-04

## 2020-10-03 MED ORDER — VANCOMYCIN VARIABLE DOSE PER UNSTABLE RENAL FUNCTION (PHARMACIST DOSING): Status: DC

## 2020-10-03 MED ORDER — ACETAMINOPHEN 650 MG RE SUPP
650.0000 mg | Freq: Four times a day (QID) | RECTAL | Status: DC | PRN
  Administered 2020-10-04: 650 mg via RECTAL
  Filled 2020-10-03: qty 1

## 2020-10-03 MED ORDER — LACTATED RINGERS IV SOLN: INTRAVENOUS | Status: DC

## 2020-10-03 MED ORDER — ENOXAPARIN SODIUM 30 MG/0.3ML ~~LOC~~ SOLN
30.0000 mg | SUBCUTANEOUS | Status: DC
  Administered 2020-10-03 – 2020-10-04 (×2): 30 mg via SUBCUTANEOUS
  Filled 2020-10-03 (×2): qty 0.3

## 2020-10-03 MED ORDER — INSULIN REGULAR(HUMAN) IN NACL 100-0.9 UT/100ML-% IV SOLN
INTRAVENOUS | Status: AC
  Administered 2020-10-03: 10.5 [IU]/h via INTRAVENOUS
  Administered 2020-10-04: 2.2 [IU]/h via INTRAVENOUS
  Filled 2020-10-03 (×2): qty 100

## 2020-10-03 MED ORDER — VALPROATE SODIUM 100 MG/ML IV SOLN
500.0000 mg | Freq: Three times a day (TID) | INTRAVENOUS | Status: DC
  Administered 2020-10-04 (×4): 500 mg via INTRAVENOUS
  Filled 2020-10-03 (×10): qty 5

## 2020-10-03 MED ORDER — LACTATED RINGERS IV BOLUS (SEPSIS)
1000.0000 mL | Freq: Once | INTRAVENOUS | Status: AC
  Administered 2020-10-03: 1000 mL via INTRAVENOUS

## 2020-10-03 MED ORDER — POTASSIUM CHLORIDE 10 MEQ/100ML IV SOLN
10.0000 meq | INTRAVENOUS | Status: AC
  Administered 2020-10-03: 10 meq via INTRAVENOUS
  Filled 2020-10-03: qty 100

## 2020-10-03 MED ORDER — POLYETHYLENE GLYCOL 3350 17 G PO PACK: 17.0000 g | PACK | Freq: Every day | ORAL | Status: DC | PRN

## 2020-10-03 MED ORDER — ONDANSETRON HCL 4 MG PO TABS: 4.0000 mg | ORAL_TABLET | Freq: Four times a day (QID) | ORAL | Status: DC | PRN

## 2020-10-03 MED ORDER — ENOXAPARIN SODIUM 30 MG/0.3ML ~~LOC~~ SOLN: 30.0000 mg | SUBCUTANEOUS | Status: DC

## 2020-10-03 MED ORDER — ACETAMINOPHEN 325 MG PO TABS: 650.0000 mg | ORAL_TABLET | Freq: Four times a day (QID) | ORAL | Status: DC | PRN

## 2020-10-03 MED ORDER — VANCOMYCIN HCL 1750 MG/350ML IV SOLN
1750.0000 mg | Freq: Once | INTRAVENOUS | Status: AC
  Administered 2020-10-03: 1750 mg via INTRAVENOUS
  Filled 2020-10-03: qty 350

## 2020-10-03 NOTE — ED Notes (Signed)
RN called MD and explained that pt already got 3 L of fluid from ED Dr. Shelly Bombard is going to hold the other 3L. Also RN mentioned pt can not take anything by mouth. MD advised to hold meds by mouth.

## 2020-10-03 NOTE — ED Notes (Signed)
Bladder scan: 134mL

## 2020-10-03 NOTE — Progress Notes (Signed)
Following for code sepsis 

## 2020-10-03 NOTE — ED Provider Notes (Signed)
[facility] EMERGENCY DEPARTMENT Provider Note   CSN: [id number] Arrival date & time: 10/20/2020  1414  LEVEL 5 CAVEAT - ALTERED MENTAL STATUS  History Chief Complaint  Patient presents with  . Altered Mental Status    Tony [name] is a 70 y.o. male.  HPI 70 year old male presents with altered mental status.  Patient's history is limited as there is no family currently present and history is primarily from the nurse.  The patient has been altered for a couple days.  Found to be febrile to 101.7 here.  Had a low O2 sats at around 90 but for EMS and was placed on oxygen.  Has recently been dealing with a great toe issue and seeing the doctor for wound care.  Currently quite altered compared to his normal baseline.  Past Medical History:  Diagnosis Date  . Alzheimer's disease ([place])   . BPH (benign prostatic hyperplasia)   . CKD (chronic kidney disease)   . Diabetes mellitus    Type II, diagnosed 2011, not on insulin; admitted in 2011 for hyperglycemia  . HTN (hypertension)   . Seizure disorder ([place])    diagnosed in childhood, last seizure was years ago    Patient Active Problem List   Diagnosis Date Noted  . Metabolic encephalopathy 14/43/1540  . Urinary retention 07/10/2019  . Acute metabolic encephalopathy 08/67/6195  . Sepsis ([place])   . Pressure injury of skin 06/13/2019  . Complicated UTI (urinary tract infection) 06/12/2019  . Sepsis secondary to UTI ([place]) 06/12/2019  . Acute renal failure superimposed on stage 3b chronic kidney disease ([place]) 06/12/2019  . BPH with obstruction/lower urinary tract symptoms 06/12/2019  . Sepsis with acute renal failure without septic shock ([place])   . Acute urinary retention   . UTI (urinary tract infection) 04/10/2019  . Acute UTI 04/10/2019  . Seizure ([place]) 12/15/2018  . CAP (community acquired pneumonia) 12/14/2018  . Dementia ([place]) 10/20/2018  . Enlarged prostate without lower urinary tract symptoms (luts) 06/30/2018   . Hyperprolactinemia ([place]) 11/20/2017  . HLD (hyperlipidemia) 09/02/2016  . Chronic kidney disease 03/19/2016  . Gynecomastia, male 04/25/2015  . Routine health maintenance 04/25/2015  . Erectile disorder due to medical condition in male patient 05/07/2014  . Hypertension 12/12/2011  . Prediabetes 12/12/2011  . Seizure disorder ([place]) 12/12/2011    Past Surgical History:  Procedure Laterality Date  . NO PAST SURGERIES    . TRANSURETHRAL RESECTION OF PROSTATE N/A 07/10/2019   Procedure: TRANSURETHRAL RESECTION OF THE PROSTATE (TURP);  Surgeon: Lucas Mallow, MD;  Location: [facility];  Service: Urology;  Laterality: N/A;       Family History  Problem Relation Age of Onset  . Diabetes Mother   . Hypertension Mother     Social History   Tobacco Use  . Smoking status: Never Smoker  . Smokeless tobacco: Never Used  Substance Use Topics  . Alcohol use: No    Alcohol/week: 0.0 standard drinks  . Drug use: No    Home Medications Prior to Admission medications   Medication Sig Start Date End Date Taking? Authorizing Provider  acetaminophen (TYLENOL) 650 MG CR tablet Take 650 mg by mouth every 8 (eight) hours as needed for pain.    [provider]  Amino Acids-Protein Hydrolys (FEEDING SUPPLEMENT, PRO-STAT SUGAR FREE 64,) LIQD Take 30 mLs by mouth 2 (two) times daily.    [provider]  amLODipine (NORVASC) 5 MG tablet Take 1 tablet (5 mg total) by  mouth daily. Patient not taking: Reported on 12/24/2019 04/15/19 07/08/19  Heath Lark D, DO  cabergoline (DOSTINEX) 0.5 MG tablet Take 0.25 mg by mouth once a week.    [provider]  divalproex (DEPAKOTE SPRINKLE) 125 MG capsule Take 500 mg by mouth in the morning, at noon, and at bedtime.    [provider]  donepezil (ARICEPT) 10 MG tablet Take 10 mg by mouth at bedtime.    [provider]  DULoxetine (CYMBALTA) 30 MG capsule Take 30 mg by mouth daily.     [provider]   melatonin 3 MG TABS tablet Take 3 mg by mouth at bedtime.    [provider]  memantine (NAMENDA) 10 MG tablet Take 10 mg by mouth 2 (two) times daily.    [provider]  Menthol, Topical Analgesic, (BIOFREEZE) 4 % GEL Apply 1 application topically 2 (two) times daily as needed (for back pain).    [provider]  Multiple Vitamin (MULTIVITAMIN WITH MINERALS) TABS tablet Take 1 tablet by mouth daily.    [provider]  mupirocin ointment (BACTROBAN) 2 % Apply 1 application topically 2 (two) times daily. 07/21/20   McDonald, Stephan Minister, DPM  PARoxetine (PAXIL-CR) 12.5 MG 24 hr tablet Take 12.5 mg by mouth daily.    [provider]  tamsulosin (FLOMAX) 0.4 MG CAPS capsule Take 1 capsule (0.4 mg total) by mouth daily after breakfast. 12/16/18   Masoudi, Dorthula Rue, MD  Vitamin D, Ergocalciferol, (DRISDOL) 1.25 MG (50000 UT) CAPS capsule Take 50,000 Units by mouth every Friday.     [provider]    Allergies    Patient has no known allergies.  Review of Systems   Review of Systems  Unable to perform ROS: Mental status change    Physical Exam Updated Vital Signs BP 111/64 (BP Location: Right Arm)   Pulse (!) 144   Temp (!) 101.7 F (38.7 C) (Rectal)   Resp 20   Ht 6' (1.829 m)   Wt 84.5 kg   SpO2 94%   BMI 25.27 kg/m   Physical Exam Vitals and nursing note reviewed.  Constitutional:      Appearance: He is well-developed and well-nourished.  HENT:     Head: Normocephalic and atraumatic.     Right Ear: External ear normal.     Left Ear: External ear normal.     Nose: Nose normal.  Eyes:     General:        Right eye: No discharge.        Left eye: No discharge.     Pupils: Pupils are equal, round, and reactive to light.  Cardiovascular:     Rate and Rhythm: Regular rhythm. Tachycardia present.     Heart sounds: Normal heart sounds.  Pulmonary:     Effort: Pulmonary effort is normal.     Breath sounds: Normal breath  sounds.  Abdominal:     General: There is no distension.     Palpations: Abdomen is soft.     Tenderness: There is no abdominal tenderness.  Genitourinary:    Comments: Small superficial sacral wound, does not appear obviously infected Musculoskeletal:        General: No edema.     Cervical back: Neck supple.     Comments: Right great toe is darker, not quite black compared to left. No pain or discharge. No obvious wound  Skin:    General: Skin is warm and dry.  Neurological:  Mental Status: He is alert. He is disoriented.     Comments: Awake and alert. Does not follow commands or speak to me  Psychiatric:        Mood and Affect: Mood is not anxious.     ED Results / Procedures / Treatments   Labs (all labs ordered are listed, but only abnormal results are displayed) Labs Reviewed  CBG MONITORING, ED - Abnormal; Notable for the following components:      Result Value   Glucose-Capillary 529 (*)    All other components within normal limits  CULTURE, BLOOD (ROUTINE X 2)  CULTURE, BLOOD (ROUTINE X 2)  URINE CULTURE  RESP PANEL BY RT-PCR (FLU A&B, COVID) ARPGX2  CBC WITH DIFFERENTIAL/PLATELET  COMPREHENSIVE METABOLIC PANEL  LACTIC ACID, PLASMA  URINALYSIS, ROUTINE W REFLEX MICROSCOPIC    EKG EKG Interpretation  Date/Time:  Monday October 03 2020 14:15:23 EST Ventricular Rate:  141 PR Interval:    QRS Duration: 55 QT Interval:  368 QTC Calculation: 564 R Axis:   -11 Text Interpretation: Sinus tachycardia Borderline low voltage, extremity leads Repolarization abnormality, prob rate related Prolonged QT interval Confirmed by Lennice Sites [phone]) on 10/25/2020 2:23:35 PM   Radiology No results found.  Procedures .Critical Care Performed by: Sherwood Gambler, MD Authorized by: Sherwood Gambler, MD   Critical care provider statement:    Critical care time (minutes):  45   Critical care time was exclusive of:  Separately billable procedures and treating other  patients   Critical care was necessary to treat or prevent imminent or life-threatening deterioration of the following conditions:  Sepsis   Critical care was time spent personally by me on the following activities:  Discussions with consultants, evaluation of patient's response to treatment, examination of patient, ordering and performing treatments and interventions, ordering and review of laboratory studies, ordering and review of radiographic studies, pulse oximetry, re-evaluation of patient's condition, obtaining history from patient or surrogate and review of old charts     Medications Ordered in ED Medications  acetaminophen (TYLENOL) suppository 650 mg (has no administration in time range)  lactated ringers bolus 1,000 mL (has no administration in time range)  ceFEPIme (MAXIPIME) 2 g in sodium chloride 0.9 % 100 mL IVPB (has no administration in time range)  metroNIDAZOLE (FLAGYL) IVPB 500 mg (has no administration in time range)  vancomycin (VANCOREADY) IVPB 1000 mg/200 mL (has no administration in time range)    ED Course  I have reviewed the triage vital signs and the nursing notes.  Pertinent labs & imaging results that were available during my care of the patient were reviewed by me and considered in my medical decision making (see chart for details).    MDM Rules/Calculators/A&P                          Patient presents with AMS and fever.  This is most likely coming from a UTI, which she has had multiple times before.  There is questionable pneumonia and with his mild hypoxia this could be contributing as well.  He was given broad IV antibiotics for sepsis.  He was given 30 cc/KG of IV fluids.  He has not been hypotensive but with his significant lactic acidosis he meets criteria for septic shock.  With Tylenol and treatments, he is more awake and talking a little though is altered.  I updated his brother on the status.  Patient is a full code.  He will  be admitted to the  internal medicine teaching service. Final Clinical Impression(s) / ED Diagnoses Final diagnoses:  Septic shock [facility])    Rx / DC Orders ED Discharge Orders    None       Sherwood Gambler, MD 10/07/2020 2300

## 2020-10-03 NOTE — Progress Notes (Signed)
Notified bedside nurse of need to draw repeat lactic acid. 

## 2020-10-03 NOTE — H&P (Addendum)
Date: 10/20/2020               Patient Name:  Tony [name] MRN: [id number]  DOB: [birth year] Age / Sex: 70 y.o., male   PCP: Patient, No Pcp Per         Medical Service: Internal Medicine Teaching Service         Attending Physician: Dr. Evette Doffing, Mallie Mussel, *    First Contact: Dr. Wynetta Emery Pager: [phone]  Second Contact: Dr. Court Joy Pager: [phone]       After Hours (After 5p/  First Contact Pager: [phone]  weekends / holidays): Second Contact Pager: [phone]   Chief Complaint: altered mental status  History of Present Illness:  Tony [name] is a 70 year old person living with Alzheimer's disease, type 2 diabetes mellitus, CKD stage III, seizure disorder, hypertension who presents from [facility] for altered mental status. On exam patient is awake, eye open and mumbling, answered yes to abdominal pain. Otherwise not able to answer any other questions. History limited due to altered mental status, remainder of HPI per chart review.   Discussed with on call RN with [facility]. State patient was treated between 2/16 and 2/23 for UTI with Keflex. Noted to have some confusion but still interactive with staff at nursing facility at that time. Per nursing staff at SNF patient found this morning with elevated blood glucose >600 and NA in 150s. Patient not speaking with staff and was sent to ED. Patient at baseline is conversational, recognizes familiar faces but disoriented to time or place due to his dementia. RN not aware of patient with any recent complaints or concerns from the staff. Not missing medications recently.  ED Course: In ED found to be febrile to 101.7, O2 saturations in low 90s with EMS placed on 3L with improvement to 100%. HR in 140s, BP of 111/64.  WBC of 21.1, NA 148, K 5, CO2 22, BUN 52, Cr. 2.93, anion gap of 20, lactic acid of 5.3, UA with >500 glucose, moderate hgb, ketones, protein 100, large leukocytes, negative nitrites, negative bacteria, non squamous epithelial cells.  COVID negative. CXR with mild left basilar opacity. Found to be in sepsis started on IV vanc, cefepime, flagyl s/p 3.5L LR.   Meds: Amlodipine 5 mg Cabergoline 0.25 mg once a week Depakote 500 mg 3 times daily Donepezil 10 mg nightly Duloxetine 30 mg daily Namenda 10 mg twice daily Paroxetine 12.5 mg daily Flomax 0.4 mg daily   Allergies: Allergies as of 10/25/2020   (Not on File)   Past Medical History:  Diagnosis Date   Alzheimer's disease ([place])    BPH (benign prostatic hyperplasia)    CKD (chronic kidney disease)    Diabetes mellitus    Type II, diagnosed 2011, not on insulin; admitted in 2011 for hyperglycemia   HTN (hypertension)    Seizure disorder ([place])    diagnosed in childhood, last seizure was years ago   Family History: Mother with diabetes and hypertension  Social History: [facility] patient currently lives at [place]: A complete ROS was negative except as per HPI.   Physical Exam: Blood pressure (!) 110/58, pulse (!) 115, temperature (!) 101.7 F (38.7 C), temperature source Rectal, resp. rate (!) 21, height 6' (1.829 m), weight 84.5 kg, SpO2 98 %. General: Patient laying in bed, chronically ill appearing, in no acute distress HENT: normocephalic, atraumatic Eyes: Pupils equal and reactive to light, no conjunctivitis  Cardiovascular: tachycardic, S1-S2  present, no gallops, rubs, or murmurs.  Pulmonary: Bronchial breath sounds, no wheezing, rhonchi or rales. No increased work of breathing on 3 L Gastrointestinal: ?tender, no rebound or guarding, normal bowel sounds, no distention, no organomegaly MSK: Normal tone, diminished but palpable DP pulses bilaterally Skin: Healed appearing nail bed of right great toe with dusky appearance of surrounding tissue, no erythema RLE warm, no discharge, LLE cool to touch Neurology: Awake, opens eyes spontaneously, mumbling, now answering questions, not following commands, no rigidity, no  myoclonus        EKG: personally reviewed my interpretation is HR of 141, prolong QT  CT Head Wo Contrast Result Date: 10/13/2020 IMPRESSION: No evidence of acute intracranial abnormality on this motion limited study. Electronically Signed   By: Margaretha Sheffield MD   On: 10/17/2020 18:18   DG Chest Port 1 View Result Date: 10/18/2020 IMPRESSION: Mild left basilar opacity may reflect atelectasis or infection. Electronically Signed   By: Logan Bores M.D.   On: 10/16/2020 15:05   DG Foot Complete Right Result Date: 10/23/2020 IMPRESSION: 1. Limited portable study due to osteopenia and positioning. No gross bony destruction evident to suggest osteomyelitis. MRI would be a more sensitive means to evaluate as clinically warranted. 2. Degenerative changes at the tibiotalar joint. Electronically Signed   By: Misty Stanley M.D.   On: 10/25/2020 15:38   Assessment & Plan by Problem: Principal Problem:   Altered mental status Active Problems:   Hypertension   Dementia (HCC)   Acute renal failure superimposed on stage 3b chronic kidney disease (H[facility])   Sepsis (H[facility])   DKA (diabetic ketoacidosis) (H[facility])   Chronic ulcer of right great toe [facility])  Tony [name] is a 70 year old person living with Alzheimer's disease, type 2 diabetes mellitus, CKD stage III, seizure disorder, hypertension who presents from [facility] for altered mental status found to have sepsis without clear source of infection and DKA.  Acute metabolic encephalopathy due to sepsis Patient presented with altered mental status. Febrile on arrival, tachycardic to 140s. WBC of 23 and lactic acidosis of 5.3. AMS likely in setting of setting though currently no clear source of infection. New O2 requirement. Chest CTA. CXR with mild left basilar opacity. UA not c/w infection, cultures pending. CT head without acute abnormalities. History of right toe infection and ulceration. Right toe ulceration appears healed, no obvious osteo on xray.  Will order MRI given no clear source of infection. He did endorse abdominal pain on exam although patient is altered and did not otherwise answer question or follow commands. Would consider abdominal imaging if pain is persistent. Per [facility] RN patient recently treated with keflex for UTI.  Started on broad spectrum antibiotics and 3.5L LR bolus in ED. Will continue on broad spectrum antibiotics possible sources of infection include pulmonary, UTI, and right toe ulceration.  - continue broad spectrum antibiotics with vancomycin and cefepime - MRI right foot ordered - trend lactic acid - Follow up blood cultures - Follow up urine cultures - hold centrally acting medications - trend fever curve - monitor on tele - SLP  DKA  Diabetes  Glucose of 605 on arrival. Appears diet controlled on home diabetes medications. A1c of 8 on admission compated to 5.3 on 12/26/2019. Na 148, Bicarb of 22, anion gap of 20. UA positive for glucose and ketones. BHB of 1.2. DKA likely precipitated in setting of sepsis although unclear source of infection at this time. Received 3.5L LR bolus in ED.  -  Endotrol protocol - BMP every 4 hours, BHB every 8 hours - Follow up troponin - CBG monitoring  Right great toe ulceration  Patient has been receiving treatment from podiatry for chronic ulcer of the right great toe sp avulsion on 07/21/2021 with full thickness ulceration to the distal tuft of the phalanx. Treated with local wound care with mupirocin and betadine dressings. On exam he has palpable pulses bilaterally, LLE cool to touch. Right toe appears to be healing well although surrounding skin is dusky and concerning for osteo given depth of infection, has pending outpatient MRI. ABI on 08/09/2020 with non compressable RLE and LLE arteries consistent with calcification without significant occlusive disease. Will order MRI to check for osteomeylitis.   -Follow up MRI - wound care  Acute on chronic kidney disease stage III   Present with BUN of 52 and Cr. 2.93 with Baseline Cr of around 1.3. Likely elevating in setting of DKA and sepsis.  - IVF - Follow up BMP - Avoid nephrotoxins   Seizures On Depakote 500 mg three times daily at home. Unable to tolerate PO medication due to AMS. Seizure may also be contributing to AMS given elevated lactic acid and hypernatermia. No tonic clonic activity witnessed while at SNF. On exam no evidence of rigidity or myoclonus. - Start Valproic acid 500 every 8 hours  Hypertension Blood pressure if 117/72 on exam. BP fluctuating in 110s to 150s in ED. On amlodipine 2.5 mg daily at home - Continue to monitor vitals - Will hold until mental status improves   BPH On tamsulosin 0.4 mg daily at home. - resume home meds on mental status improves - monitor I/Os   Dementia On donepezil 10 mg at bedtime, memantine 50 twice daily, and paroxetine 12.5 mg daily - Hold home medication due to AMS  Diet: NPO Fluids:LR 125 mL/hr VTE: Heparin Code: Full  Dispo: Admit patient to Inpatient with expected length of stay greater than 2 midnights.  Signed: Iona Beard, MD 10/18/2020, 10:48 PM  Pager: [phone] After 5pm on weekdays and 1pm on weekends: On Call pager: [phone]

## 2020-10-03 NOTE — Progress Notes (Signed)
Pharmacy Antibiotic Note  Tony [name] is a 70 y.o. male admitted on 09/28/2020 with sepsis.  Pharmacy has been consulted for Cefepime and Vancomycin dosing.   Height: 6' (182.9 cm) Weight: 84.5 kg (186 lb 4.6 oz) IBW/kg (Calculated) : 77.6  Temp (24hrs), Avg:101.7 F (38.7 C), Min:101.7 F (38.7 C), Max:101.7 F (38.7 C)  Recent Labs  Lab 10/15/2020 1442  WBC 21.1*  CREATININE 2.93*  LATICACIDVEN 5.3*    Estimated Creatinine Clearance: 25.7 mL/min (A) (by C-G formula based on SCr of 2.93 mg/dL (H)).    Not on File  Antimicrobials this admission: 3/7 Cefepime >>  3/7 Vancomycin >>   Dose adjustments this admission: N/a  Microbiology results: Pending   Plan:  - Cefepime 2g IV q24h - Vancomycin 1750mg  IV x 1 dose  - Will continue to dose vancomycin based on random levels due to AKI (baseline Scr ~ 1.2) - Monitor patients renal function and urine output  - De-escalate ABX when appropriate   Thank you for allowing pharmacy to be a part of this patient's care.  Duanne Limerick PharmD. BCPS 10/22/2020 4:15 PM

## 2020-10-03 NOTE — ED Triage Notes (Signed)
Patient presents to ED from [facility] for altered loc, per family this is totally different from his baseline per family. Per NH patient is more confused and malaise however reports patient was able to take his Metformin today..Patient had o2 sats in the low 90's placed on o2 at 4 l/[place].small quarter size broken area on buttocks.

## 2020-10-03 NOTE — ED Notes (Signed)
Critical lab glucose 532, Endotool to be used accordingly

## 2020-10-03 NOTE — ED Notes (Signed)
Called Patient brother Chriss Czar and updated on patient status.

## 2020-10-03 NOTE — Progress Notes (Signed)
Elink following, Dr. Lisabeth Devoid Will be placing additional lactic acid order

## 2020-10-03 NOTE — Progress Notes (Signed)
Notified provider of need to order repeat lactic acid. ° °

## 2020-10-04 ENCOUNTER — Inpatient Hospital Stay (HOSPITAL_COMMUNITY): Payer: Medicare (Managed Care)

## 2020-10-04 ENCOUNTER — Telehealth: Payer: Self-pay | Admitting: Podiatry

## 2020-10-04 DIAGNOSIS — R4182 Altered mental status, unspecified: Secondary | ICD-10-CM

## 2020-10-04 DIAGNOSIS — Z9911 Dependence on respirator [ventilator] status: Secondary | ICD-10-CM

## 2020-10-04 DIAGNOSIS — G309 Alzheimer's disease, unspecified: Secondary | ICD-10-CM | POA: Diagnosis not present

## 2020-10-04 DIAGNOSIS — F0280 Dementia in other diseases classified elsewhere without behavioral disturbance: Secondary | ICD-10-CM

## 2020-10-04 DIAGNOSIS — R6521 Severe sepsis with septic shock: Secondary | ICD-10-CM | POA: Diagnosis not present

## 2020-10-04 DIAGNOSIS — R400 Somnolence: Secondary | ICD-10-CM | POA: Diagnosis not present

## 2020-10-04 DIAGNOSIS — A419 Sepsis, unspecified organism: Principal | ICD-10-CM

## 2020-10-04 DIAGNOSIS — N179 Acute kidney failure, unspecified: Secondary | ICD-10-CM | POA: Diagnosis not present

## 2020-10-04 DIAGNOSIS — Z978 Presence of other specified devices: Secondary | ICD-10-CM

## 2020-10-04 DIAGNOSIS — N1832 Chronic kidney disease, stage 3b: Secondary | ICD-10-CM

## 2020-10-04 DIAGNOSIS — E1110 Type 2 diabetes mellitus with ketoacidosis without coma: Secondary | ICD-10-CM

## 2020-10-04 DIAGNOSIS — L97519 Non-pressure chronic ulcer of other part of right foot with unspecified severity: Secondary | ICD-10-CM

## 2020-10-04 DIAGNOSIS — R4189 Other symptoms and signs involving cognitive functions and awareness: Secondary | ICD-10-CM

## 2020-10-04 DIAGNOSIS — M86671 Other chronic osteomyelitis, right ankle and foot: Secondary | ICD-10-CM

## 2020-10-04 DIAGNOSIS — R6520 Severe sepsis without septic shock: Secondary | ICD-10-CM | POA: Diagnosis not present

## 2020-10-04 DIAGNOSIS — R569 Unspecified convulsions: Secondary | ICD-10-CM | POA: Diagnosis not present

## 2020-10-04 DIAGNOSIS — M86171 Other acute osteomyelitis, right ankle and foot: Secondary | ICD-10-CM

## 2020-10-04 LAB — PHOSPHORUS
Phosphorus: 1.9 mg/dL — ABNORMAL LOW (ref 2.5–4.6)
Phosphorus: 2.2 mg/dL — ABNORMAL LOW (ref 2.5–4.6)
Phosphorus: 2.8 mg/dL (ref 2.5–4.6)

## 2020-10-04 LAB — HEPATIC FUNCTION PANEL
ALT: 9 U/L (ref 0–44)
AST: 21 U/L (ref 15–41)
Albumin: 2.2 g/dL — ABNORMAL LOW (ref 3.5–5.0)
Alkaline Phosphatase: 51 U/L (ref 38–126)
Bilirubin, Direct: 0.1 mg/dL (ref 0.0–0.2)
Indirect Bilirubin: 0.7 mg/dL (ref 0.3–0.9)
Total Bilirubin: 0.8 mg/dL (ref 0.3–1.2)
Total Protein: 6.5 g/dL (ref 6.5–8.1)

## 2020-10-04 LAB — BASIC METABOLIC PANEL
Anion gap: 12 (ref 5–15)
Anion gap: 12 (ref 5–15)
Anion gap: 13 (ref 5–15)
Anion gap: 14 (ref 5–15)
Anion gap: 14 (ref 5–15)
BUN: 43 mg/dL — ABNORMAL HIGH (ref 8–23)
BUN: 37 mg/dL — ABNORMAL HIGH (ref 8–23)
BUN: 32 mg/dL — ABNORMAL HIGH (ref 8–23)
BUN: 31 mg/dL — ABNORMAL HIGH (ref 8–23)
BUN: 33 mg/dL — ABNORMAL HIGH (ref 8–23)
CO2: 25 mmol/L (ref 22–32)
CO2: 25 mmol/L (ref 22–32)
CO2: 26 mmol/L (ref 22–32)
CO2: 25 mmol/L (ref 22–32)
CO2: 24 mmol/L (ref 22–32)
Calcium: 10.7 mg/dL — ABNORMAL HIGH (ref 8.9–10.3)
Calcium: 10.4 mg/dL — ABNORMAL HIGH (ref 8.9–10.3)
Calcium: 10.8 mg/dL — ABNORMAL HIGH (ref 8.9–10.3)
Calcium: 10.1 mg/dL (ref 8.9–10.3)
Calcium: 10.1 mg/dL (ref 8.9–10.3)
Chloride: 114 mmol/L — ABNORMAL HIGH (ref 98–111)
Chloride: 115 mmol/L — ABNORMAL HIGH (ref 98–111)
Chloride: 112 mmol/L — ABNORMAL HIGH (ref 98–111)
Chloride: 113 mmol/L — ABNORMAL HIGH (ref 98–111)
Chloride: 113 mmol/L — ABNORMAL HIGH (ref 98–111)
Creatinine, Ser: 2.07 mg/dL — ABNORMAL HIGH (ref 0.61–1.24)
Creatinine, Ser: 1.69 mg/dL — ABNORMAL HIGH (ref 0.61–1.24)
Creatinine, Ser: 1.72 mg/dL — ABNORMAL HIGH (ref 0.61–1.24)
Creatinine, Ser: 1.74 mg/dL — ABNORMAL HIGH (ref 0.61–1.24)
Creatinine, Ser: 1.61 mg/dL — ABNORMAL HIGH (ref 0.61–1.24)
GFR, Estimated: 34 mL/min — ABNORMAL LOW (ref >60)
GFR, Estimated: 43 mL/min — ABNORMAL LOW (ref >60)
GFR, Estimated: 42 mL/min — ABNORMAL LOW (ref >60)
GFR, Estimated: 42 mL/min — ABNORMAL LOW (ref >60)
GFR, Estimated: 46 mL/min — ABNORMAL LOW (ref >60)
Glucose, Bld: 254 mg/dL — ABNORMAL HIGH (ref 70–99)
Glucose, Bld: 168 mg/dL — ABNORMAL HIGH (ref 70–99)
Glucose, Bld: 117 mg/dL — ABNORMAL HIGH (ref 70–99)
Glucose, Bld: 176 mg/dL — ABNORMAL HIGH (ref 70–99)
Glucose, Bld: 191 mg/dL — ABNORMAL HIGH (ref 70–99)
Potassium: 3.9 mmol/L (ref 3.5–5.1)
Potassium: 3.8 mmol/L (ref 3.5–5.1)
Potassium: 3.9 mmol/L (ref 3.5–5.1)
Potassium: 3.8 mmol/L (ref 3.5–5.1)
Potassium: 3.7 mmol/L (ref 3.5–5.1)
Sodium: 151 mmol/L — ABNORMAL HIGH (ref 135–145)
Sodium: 152 mmol/L — ABNORMAL HIGH (ref 135–145)
Sodium: 151 mmol/L — ABNORMAL HIGH (ref 135–145)
Sodium: 152 mmol/L — ABNORMAL HIGH (ref 135–145)
Sodium: 151 mmol/L — ABNORMAL HIGH (ref 135–145)

## 2020-10-04 LAB — GLUCOSE, CAPILLARY
Glucose-Capillary: 222 mg/dL — ABNORMAL HIGH (ref 70–99)
Glucose-Capillary: 217 mg/dL — ABNORMAL HIGH (ref 70–99)
Glucose-Capillary: 183 mg/dL — ABNORMAL HIGH (ref 70–99)
Glucose-Capillary: 143 mg/dL — ABNORMAL HIGH (ref 70–99)
Glucose-Capillary: 165 mg/dL — ABNORMAL HIGH (ref 70–99)
Glucose-Capillary: 137 mg/dL — ABNORMAL HIGH (ref 70–99)
Glucose-Capillary: 161 mg/dL — ABNORMAL HIGH (ref 70–99)
Glucose-Capillary: 100 mg/dL — ABNORMAL HIGH (ref 70–99)
Glucose-Capillary: 114 mg/dL — ABNORMAL HIGH (ref 70–99)
Glucose-Capillary: 159 mg/dL — ABNORMAL HIGH (ref 70–99)
Glucose-Capillary: 207 mg/dL — ABNORMAL HIGH (ref 70–99)
Glucose-Capillary: 209 mg/dL — ABNORMAL HIGH (ref 70–99)
Glucose-Capillary: 242 mg/dL — ABNORMAL HIGH (ref 70–99)

## 2020-10-04 LAB — POCT I-STAT 7, (LYTES, BLD GAS, ICA,H+H)
Acid-Base Excess: 5 mmol/L — ABNORMAL HIGH (ref 0.0–2.0)
Bicarbonate: 26.4 mmol/L (ref 20.0–28.0)
Calcium, Ion: 1.33 mmol/L (ref 1.15–1.40)
HCT: 35 % — ABNORMAL LOW (ref 39.0–52.0)
Hemoglobin: 11.9 g/dL — ABNORMAL LOW (ref 13.0–17.0)
O2 Saturation: 100 %
Patient temperature: 101.7
Potassium: 3.4 mmol/L — ABNORMAL LOW (ref 3.5–5.1)
Sodium: 154 mmol/L — ABNORMAL HIGH (ref 135–145)
TCO2: 27 mmol/L (ref 22–32)
pCO2 arterial: 32.1 mmHg (ref 32.0–48.0)
pH, Arterial: 7.529 — ABNORMAL HIGH (ref 7.350–7.450)
pO2, Arterial: 174 mmHg — ABNORMAL HIGH (ref 83.0–108.0)

## 2020-10-04 LAB — OSMOLALITY, URINE
Osmolality, Ur: 511 mOsm/kg (ref 300–900)
Osmolality, Ur: 569 mOsm/kg (ref 300–900)

## 2020-10-04 LAB — TROPONIN I (HIGH SENSITIVITY): Troponin I (High Sensitivity): 26 ng/L — ABNORMAL HIGH (ref <18)

## 2020-10-04 LAB — CBC
HCT: 43.5 % (ref 39.0–52.0)
HCT: 39.5 % (ref 39.0–52.0)
Hemoglobin: 13.6 g/dL (ref 13.0–17.0)
Hemoglobin: 12.1 g/dL — ABNORMAL LOW (ref 13.0–17.0)
MCH: 30.7 pg (ref 26.0–34.0)
MCH: 30.7 pg (ref 26.0–34.0)
MCHC: 31.3 g/dL (ref 30.0–36.0)
MCHC: 30.6 g/dL (ref 30.0–36.0)
MCV: 98.2 fL (ref 80.0–100.0)
MCV: 100.3 fL — ABNORMAL HIGH (ref 80.0–100.0)
Platelets: 233 10*3/uL (ref 150–400)
Platelets: 208 10*3/uL (ref 150–400)
RBC: 4.43 MIL/uL (ref 4.22–5.81)
RBC: 3.94 MIL/uL — ABNORMAL LOW (ref 4.22–5.81)
RDW: 12.3 % (ref 11.5–15.5)
RDW: 12.4 % (ref 11.5–15.5)
WBC: 13.1 10*3/uL — ABNORMAL HIGH (ref 4.0–10.5)
WBC: 13.4 10*3/uL — ABNORMAL HIGH (ref 4.0–10.5)
nRBC: 0 % (ref 0.0–0.2)
nRBC: 0.1 % (ref 0.0–0.2)

## 2020-10-04 LAB — CSF CELL COUNT WITH DIFFERENTIAL
RBC Count, CSF: 48 /mm3 — ABNORMAL HIGH
Tube #: 1
WBC, CSF: 6 /mm3 — ABNORMAL HIGH (ref 0–5)

## 2020-10-04 LAB — PROTIME-INR
INR: 1.4 — ABNORMAL HIGH (ref 0.8–1.2)
Prothrombin Time: 16.7 seconds — ABNORMAL HIGH (ref 11.4–15.2)

## 2020-10-04 LAB — CK: Total CK: 131 U/L (ref 49–397)

## 2020-10-04 LAB — LACTIC ACID, PLASMA
Lactic Acid, Venous: 6.3 mmol/L (ref 0.5–1.9)
Lactic Acid, Venous: 5.4 mmol/L (ref 0.5–1.9)
Lactic Acid, Venous: 5.2 mmol/L (ref 0.5–1.9)
Lactic Acid, Venous: 6.1 mmol/L (ref 0.5–1.9)
Lactic Acid, Venous: 6.2 mmol/L (ref 0.5–1.9)

## 2020-10-04 LAB — MAGNESIUM
Magnesium: 1.7 mg/dL (ref 1.7–2.4)
Magnesium: 1.7 mg/dL (ref 1.7–2.4)
Magnesium: 1.9 mg/dL (ref 1.7–2.4)
Magnesium: 1.7 mg/dL (ref 1.7–2.4)

## 2020-10-04 LAB — MRSA PCR SCREENING: MRSA by PCR: NEGATIVE

## 2020-10-04 LAB — VALPROIC ACID LEVEL: Valproic Acid Lvl: 44 ug/mL — ABNORMAL LOW (ref 50.0–100.0)

## 2020-10-04 LAB — PROTEIN, CSF: Total  Protein, CSF: 113 mg/dL — ABNORMAL HIGH (ref 15–45)

## 2020-10-04 LAB — AMMONIA: Ammonia: 21 umol/L (ref 9–35)

## 2020-10-04 LAB — GLUCOSE, CSF: Glucose, CSF: 139 mg/dL — ABNORMAL HIGH (ref 40–70)

## 2020-10-04 LAB — BETA-HYDROXYBUTYRIC ACID
Beta-Hydroxybutyric Acid: 0.12 mmol/L (ref 0.05–0.27)
Beta-Hydroxybutyric Acid: 0.89 mmol/L — ABNORMAL HIGH (ref 0.05–0.27)
Beta-Hydroxybutyric Acid: 0.26 mmol/L (ref 0.05–0.27)

## 2020-10-04 MED ORDER — CHLORHEXIDINE GLUCONATE CLOTH 2 % EX PADS
6.0000 | MEDICATED_PAD | Freq: Every day | CUTANEOUS | Status: DC
  Administered 2020-10-04 – 2020-10-20 (×16): 6 via TOPICAL

## 2020-10-04 MED ORDER — PIPERACILLIN-TAZOBACTAM 3.375 G IVPB
3.3750 g | Freq: Three times a day (TID) | INTRAVENOUS | Status: DC
  Administered 2020-10-04 – 2020-10-12 (×25): 3.375 g via INTRAVENOUS
  Filled 2020-10-04 (×24): qty 50

## 2020-10-04 MED ORDER — VITAL HIGH PROTEIN PO LIQD: 1000.0000 mL | ORAL | Status: DC

## 2020-10-04 MED ORDER — ETOMIDATE 2 MG/ML IV SOLN
INTRAVENOUS | Status: AC
  Administered 2020-10-04: 20 mg
  Filled 2020-10-04: qty 20

## 2020-10-04 MED ORDER — FENTANYL CITRATE (PF) 100 MCG/2ML IJ SOLN
INTRAMUSCULAR | Status: AC
  Administered 2020-10-04: 50 ug
  Filled 2020-10-04: qty 2

## 2020-10-04 MED ORDER — MAGNESIUM SULFATE 2 GM/50ML IV SOLN
2.0000 g | Freq: Once | INTRAVENOUS | Status: AC
  Administered 2020-10-04: 2 g via INTRAVENOUS
  Filled 2020-10-04: qty 50

## 2020-10-04 MED ORDER — MIDAZOLAM HCL 2 MG/2ML IJ SOLN
1.0000 mg | INTRAMUSCULAR | Status: DC | PRN
  Administered 2020-10-05 – 2020-10-14 (×6): 1 mg via INTRAVENOUS
  Filled 2020-10-04 (×6): qty 2

## 2020-10-04 MED ORDER — VITAL HIGH PROTEIN PO LIQD
1000.0000 mL | ORAL | Status: DC
  Administered 2020-10-04: 1000 mL

## 2020-10-04 MED ORDER — LACTATED RINGERS IV BOLUS
1000.0000 mL | Freq: Once | INTRAVENOUS | Status: AC
  Administered 2020-10-04: 1000 mL via INTRAVENOUS

## 2020-10-04 MED ORDER — DOCUSATE SODIUM 50 MG/5ML PO LIQD
100.0000 mg | Freq: Two times a day (BID) | ORAL | Status: DC
  Administered 2020-10-04 – 2020-10-15 (×11): 100 mg
  Filled 2020-10-04 (×12): qty 10

## 2020-10-04 MED ORDER — MIDAZOLAM HCL 2 MG/2ML IJ SOLN
1.0000 mg | INTRAMUSCULAR | Status: AC | PRN
  Administered 2020-10-04 – 2020-10-05 (×3): 1 mg via INTRAVENOUS
  Filled 2020-10-04 (×2): qty 2

## 2020-10-04 MED ORDER — FREE WATER
200.0000 mL | Freq: Four times a day (QID) | Status: DC
  Administered 2020-10-04 – 2020-10-05 (×4): 200 mL

## 2020-10-04 MED ORDER — THIAMINE HCL 100 MG/ML IJ SOLN
100.0000 mg | Freq: Every day | INTRAMUSCULAR | Status: DC
  Administered 2020-10-04: 100 mg via INTRAVENOUS
  Filled 2020-10-04: qty 2

## 2020-10-04 MED ORDER — POLYETHYLENE GLYCOL 3350 17 G PO PACK
17.0000 g | PACK | Freq: Every day | ORAL | Status: DC
  Administered 2020-10-04 – 2020-10-15 (×6): 17 g
  Filled 2020-10-04 (×7): qty 1

## 2020-10-04 MED ORDER — VITAL 1.5 CAL PO LIQD
1000.0000 mL | ORAL | Status: DC
  Administered 2020-10-04 – 2020-10-11 (×7): 1000 mL
  Filled 2020-10-04 (×8): qty 1000

## 2020-10-04 MED ORDER — FENTANYL CITRATE (PF) 100 MCG/2ML IJ SOLN
25.0000 ug | INTRAMUSCULAR | Status: AC | PRN
  Administered 2020-10-04 (×3): 25 ug via INTRAVENOUS
  Filled 2020-10-04: qty 2

## 2020-10-04 MED ORDER — PROSOURCE TF PO LIQD
45.0000 mL | Freq: Two times a day (BID) | ORAL | Status: DC
  Administered 2020-10-04: 45 mL
  Filled 2020-10-04: qty 45

## 2020-10-04 MED ORDER — PROSOURCE TF PO LIQD: 45.0000 mL | Freq: Two times a day (BID) | ORAL | Status: DC

## 2020-10-04 MED ORDER — SODIUM CHLORIDE 0.9 % IV SOLN
Freq: Every day | INTRAVENOUS | Status: DC
  Filled 2020-10-04 (×4): qty 50

## 2020-10-04 MED ORDER — LIDOCAINE HCL (PF) 1 % IJ SOLN
INTRAMUSCULAR | Status: AC
  Administered 2020-10-04: 5 mL
  Filled 2020-10-04: qty 5

## 2020-10-04 MED ORDER — PANTOPRAZOLE SODIUM 40 MG IV SOLR
40.0000 mg | Freq: Every day | INTRAVENOUS | Status: DC
  Administered 2020-10-04: 40 mg via INTRAVENOUS
  Filled 2020-10-04: qty 40

## 2020-10-04 MED ORDER — INSULIN DETEMIR 100 UNIT/ML ~~LOC~~ SOLN
15.0000 [IU] | Freq: Two times a day (BID) | SUBCUTANEOUS | Status: DC
  Administered 2020-10-04 (×2): 15 [IU] via SUBCUTANEOUS
  Filled 2020-10-04 (×5): qty 0.15

## 2020-10-04 MED ORDER — ACETAMINOPHEN 160 MG/5ML PO SOLN
650.0000 mg | Freq: Four times a day (QID) | ORAL | Status: DC | PRN
  Administered 2020-10-05 – 2020-10-10 (×12): 650 mg
  Filled 2020-10-04 (×12): qty 20.3

## 2020-10-04 MED ORDER — ADULT MULTIVITAMIN W/MINERALS CH
1.0000 | ORAL_TABLET | Freq: Every day | ORAL | Status: DC
  Administered 2020-10-04 – 2020-10-15 (×12): 1
  Filled 2020-10-04 (×12): qty 1

## 2020-10-04 MED ORDER — DEXTROSE 5 % IV SOLN
INTRAVENOUS | Status: DC
  Administered 2020-10-04: 1000 mL via INTRAVENOUS

## 2020-10-04 MED ORDER — LORAZEPAM 2 MG/ML IJ SOLN
2.0000 mg | Freq: Once | INTRAMUSCULAR | Status: AC
  Administered 2020-10-04: 2 mg via INTRAVENOUS
  Filled 2020-10-04: qty 1

## 2020-10-04 MED ORDER — ASCORBIC ACID 500 MG/ML IJ SOLN
500.0000 mg | Freq: Every day | INTRAMUSCULAR | Status: DC
  Filled 2020-10-04 (×5): qty 1

## 2020-10-04 MED ORDER — GADOBUTROL 1 MMOL/ML IV SOLN
9.0000 mL | Freq: Once | INTRAVENOUS | Status: AC | PRN
  Administered 2020-10-04: 9 mL via INTRAVENOUS

## 2020-10-04 MED ORDER — LACTATED RINGERS IV SOLN: INTRAVENOUS | Status: DC

## 2020-10-04 MED ORDER — LIDOCAINE HCL (PF) 1 % IJ SOLN: 5.0000 mL | Freq: Once | INTRAMUSCULAR | Status: AC

## 2020-10-04 MED ORDER — ACETAMINOPHEN 650 MG RE SUPP
650.0000 mg | Freq: Four times a day (QID) | RECTAL | Status: DC | PRN
  Filled 2020-10-04: qty 1

## 2020-10-04 MED ORDER — CHLORHEXIDINE GLUCONATE 0.12% ORAL RINSE (MEDLINE KIT)
15.0000 mL | Freq: Two times a day (BID) | OROMUCOSAL | Status: DC
  Administered 2020-10-04 – 2020-10-20 (×34): 15 mL via OROMUCOSAL

## 2020-10-04 MED ORDER — POTASSIUM PHOSPHATES 15 MMOLE/5ML IV SOLN
10.0000 mmol | Freq: Once | INTRAVENOUS | Status: AC
  Administered 2020-10-04: 10 mmol via INTRAVENOUS
  Filled 2020-10-04: qty 3.33

## 2020-10-04 MED ORDER — MIDAZOLAM HCL 2 MG/2ML IJ SOLN
INTRAMUSCULAR | Status: AC
  Filled 2020-10-04: qty 4

## 2020-10-04 MED ORDER — INSULIN ASPART 100 UNIT/ML ~~LOC~~ SOLN
0.0000 [IU] | SUBCUTANEOUS | Status: DC
  Administered 2020-10-04: 20:00:00 5 [IU] via SUBCUTANEOUS
  Administered 2020-10-04: 3 [IU] via SUBCUTANEOUS
  Administered 2020-10-04 – 2020-10-05 (×5): 5 [IU] via SUBCUTANEOUS
  Administered 2020-10-05: 8 [IU] via SUBCUTANEOUS
  Administered 2020-10-05: 5 [IU] via SUBCUTANEOUS
  Administered 2020-10-05: 8 [IU] via SUBCUTANEOUS
  Administered 2020-10-06 (×2): 5 [IU] via SUBCUTANEOUS
  Administered 2020-10-06: 8 [IU] via SUBCUTANEOUS
  Administered 2020-10-06: 5 [IU] via SUBCUTANEOUS
  Administered 2020-10-07 (×4): 8 [IU] via SUBCUTANEOUS
  Administered 2020-10-07: 2 [IU] via SUBCUTANEOUS
  Administered 2020-10-08 (×2): 5 [IU] via SUBCUTANEOUS
  Administered 2020-10-08: 3 [IU] via SUBCUTANEOUS
  Administered 2020-10-08: 5 [IU] via SUBCUTANEOUS

## 2020-10-04 MED ORDER — ONDANSETRON HCL 4 MG/2ML IJ SOLN: 4.0000 mg | Freq: Four times a day (QID) | INTRAMUSCULAR | Status: DC | PRN

## 2020-10-04 MED ORDER — ONDANSETRON HCL 4 MG PO TABS: 4.0000 mg | ORAL_TABLET | Freq: Four times a day (QID) | ORAL | Status: DC | PRN

## 2020-10-04 MED ORDER — DEXTROSE 10 % IV SOLN: INTRAVENOUS | Status: DC

## 2020-10-04 MED ORDER — FENTANYL CITRATE (PF) 100 MCG/2ML IJ SOLN
25.0000 ug | INTRAMUSCULAR | Status: DC | PRN
  Administered 2020-10-04: 16:00:00 100 ug via INTRAVENOUS
  Administered 2020-10-04 – 2020-10-05 (×4): 50 ug via INTRAVENOUS
  Administered 2020-10-05: 100 ug via INTRAVENOUS
  Administered 2020-10-05: 75 ug via INTRAVENOUS
  Administered 2020-10-05: 21:00:00 100 ug via INTRAVENOUS
  Administered 2020-10-08 (×2): 25 ug via INTRAVENOUS
  Administered 2020-10-08 (×2): 50 ug via INTRAVENOUS
  Administered 2020-10-08: 25 ug via INTRAVENOUS
  Administered 2020-10-09: 75 ug via INTRAVENOUS
  Administered 2020-10-09: 50 ug via INTRAVENOUS
  Administered 2020-10-09: 25 ug via INTRAVENOUS
  Administered 2020-10-09 (×2): 50 ug via INTRAVENOUS
  Administered 2020-10-10: 100 ug via INTRAVENOUS
  Filled 2020-10-04 (×11): qty 2

## 2020-10-04 MED ORDER — SODIUM CHLORIDE 0.9 % IV SOLN
750.0000 mg | Freq: Every day | INTRAVENOUS | Status: DC
  Administered 2020-10-04 – 2020-10-11 (×7): 750 mg via INTRAVENOUS
  Filled 2020-10-04 (×10): qty 15

## 2020-10-04 MED ORDER — SODIUM CHLORIDE 0.9 % IV SOLN
INTRAVENOUS | Status: DC | PRN
  Administered 2020-10-04: 1000 mL via INTRAVENOUS

## 2020-10-04 MED ORDER — LIDOCAINE HCL (PF) 1 % IJ SOLN
5.0000 mL | Freq: Once | INTRAMUSCULAR | Status: AC
  Administered 2020-10-04: 3 mL

## 2020-10-04 MED ORDER — POLYETHYLENE GLYCOL 3350 17 G PO PACK: 17.0000 g | PACK | Freq: Every day | ORAL | Status: DC | PRN

## 2020-10-04 MED ORDER — INSULIN ASPART 100 UNIT/ML ~~LOC~~ SOLN
2.0000 [IU] | SUBCUTANEOUS | Status: DC
  Administered 2020-10-04 – 2020-10-05 (×4): 2 [IU] via SUBCUTANEOUS

## 2020-10-04 MED ORDER — PROSOURCE TF PO LIQD
45.0000 mL | Freq: Four times a day (QID) | ORAL | Status: DC
  Administered 2020-10-04 – 2020-10-15 (×41): 45 mL
  Filled 2020-10-04 (×38): qty 45

## 2020-10-04 MED ORDER — ROCURONIUM BROMIDE 10 MG/ML (PF) SYRINGE
PREFILLED_SYRINGE | INTRAVENOUS | Status: AC
  Administered 2020-10-04: 50 mg
  Filled 2020-10-04: qty 10

## 2020-10-04 MED ORDER — ORAL CARE MOUTH RINSE
15.0000 mL | OROMUCOSAL | Status: DC
  Administered 2020-10-04 – 2020-10-17 (×117): 15 mL via OROMUCOSAL

## 2020-10-04 NOTE — Progress Notes (Signed)
SLP Cancellation Note  Patient Details Name: Tony [name] MRN: [id number] DOB: 07-31-1950   Cancelled treatment:       Reason Eval/Treat Not Completed: Medical issues which prohibited therapy; pt now intubated, will continue to follow.   [place], CCC-SLP Acute Rehabilitation Services   10/04/2020, 8:46 AM

## 2020-10-04 NOTE — Progress Notes (Signed)
eLink Physician-Brief Progress Note Patient Name: Tony [name] DOB: November 07, 1950 MRN: [id number]   Date of Service  10/04/2020  HPI/Events of Note  Hyperglycemia - Last Blood glucose = 209. Last B-Hydroxybutyric Acid = 0.89 at 1:53 PM. Last Lactic Acid = 6.2. LVEF = 60-65%,  eICU Interventions  Plan: 1. Beta Hydroxybutyric Acid level now.  2. LR 1 liter IV over 1 hour now.  3. Repeat Lactic Acid level at 12 midnight.      Intervention Category Major Interventions: Hyperglycemia - active titration of insulin therapy;Acid-Base disturbance - evaluation and management  Lysle Dingwall 10/04/2020, 8:48 PM

## 2020-10-04 NOTE — Progress Notes (Signed)
Subjective: Continues to have twitching movements predominantly with stimulation.  ROS: Unable to obtain due to poor mental status  Examination  Vital signs in last 24 hours: Temp:  [98.1 F (36.7 C)-102.6 F (39.2 C)] 102.5 F (39.2 C) (03/08 1200) Pulse Rate:  [113-148] 148 (03/08 1200) Resp:  [10-38] 29 (03/08 1200) BP: (85-150)/(58-127) 132/104 (03/08 1200) SpO2:  [93 %-100 %] 99 % (03/08 1224) FiO2 (%):  [50 %-100 %] 50 % (03/08 1224) Weight:  [84.5 kg] 84.5 kg (03/07 1451)  General: lying in bed, not in apparent distress CVS: pulse-normal rate and rhythm RS: breathing comfortably Extremities: normal  Neuro: Comatose, does not open eyes to noxious stimuli, PERRLA, corneal reflex intact, gag is intact, withdraws to noxious in all 4 extremities, tremor-like movements on stimulation  Basic Metabolic Panel: Recent Labs  Lab 10/07/2020 1442 10/17/2020 1937 10/04/20 0202 10/04/20 0632 10/04/20 0923 10/04/20 0927  NA 148* 149* 151* 152* 154* 151*  K 5.0 5.0 3.9 3.8 3.4* 3.9  CL 106 109 114* 115*  --  112*  CO2 22 25 25 25   --  26  GLUCOSE 605* 532* 254* 168*  --  117*  BUN 52* 47* 43* 37*  --  32*  CREATININE 2.93* 2.55* 2.07* 1.69*  --  1.72*  CALCIUM 10.5* 9.9 10.7* 10.4*  --  10.8*  MG  --   --   --  1.7  --  1.7  PHOS  --   --   --   --   --  1.9*    CBC: Recent Labs  Lab 10/16/2020 1442 10/04/20 0632 10/04/20 0923  WBC 21.1* 13.1*  --   NEUTROABS 18.3*  --   --   HGB 14.7 13.6 11.9*  HCT 46.2 43.5 35.0*  MCV 98.3 98.2  --   PLT 313 233  --      Coagulation Studies: No results for input(s): LABPROT, INR in the last 72 hours.  Imaging CT head without contrast 10/04/2020: No acute abnormality.  Remote bilateral cerebellar infarcts.   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  Acute encephalopathy Bilateral  upper extremity twitching Hypernatremia Diabetic ketoacidosis Epilepsy -Encephalopathy most likely secondary to possible infection, hypernatremia, DKA, AKI in setting of poor neurological reserve (dementia)  -Bilateral approximately twitching predominantly noted on stimulation.  No evidence of epileptogenic city on EEG.  Therefore low suspicion for seizures.  More likely secondary to sepsis, encephalopathy.  Recommendations -Continue Depakote 500 mg every 8 hours -We will check Depakote level and ammonia level to assess if it is contributing to encephalopathy.  Also patient has any further seizure-like episodes, Depakote level can help guide treatment -Routine EEG did not show any evidence of seizures.  If concern persists, can consider prolonged EEG - Urine culture pending, will follow -MRI brain without contrast to look for any acute abnormality ordered and pending -Critical care team planning on lumbar puncture to look for meningitis/encephalitis -Continue seizure precautions Management of rest of qualities per primary team  CRITICAL CARE Performed by: Lora Havens   Total critical care time: 35 minutes  Critical care time was exclusive of separately billable procedures and treating other patients.  Critical care was necessary to treat or prevent imminent or life-threatening deterioration.  Critical care was time spent personally by me on the following activities: development of treatment plan with patient and/or  surrogate as well as nursing, discussions with consultants, evaluation of patient's response to treatment, examination of patient, obtaining history from patient or surrogate, ordering and performing treatments and interventions, ordering and review of laboratory studies, ordering and review of radiographic studies, pulse oximetry and re-evaluation of patient's condition.   Zeb Comfort Epilepsy [facility] For questions after 5pm please refer to AMION  to reach the Neurologist on call

## 2020-10-04 NOTE — Progress Notes (Signed)
Initial Nutrition Assessment  DOCUMENTATION CODES:   Not applicable  INTERVENTION:   Tube Feeding via OG:  Vital 1.5 at 55 ml/hr Pro-Source TF 45 mL QID Provides 2140 kcals, 133 g of protein and 1003 mL of free water  Recommend supplementing phosphorus, discussed with Pharmacy   NUTRITION DIAGNOSIS:   Inadequate oral intake related to acute illness as evidenced by NPO status.  GOAL:   Patient will meet greater than or equal to 90% of their needs  MONITOR:   PO intake,Supplement acceptance,Labs,Weight trends  REASON FOR ASSESSMENT:   Ventilator,Consult Enteral/tube feeding initiation and management  ASSESSMENT:   70 yo admitted with AMS and sepsis due to right foot osteomyelitis, AKI, acute metabolic encephalopathy. PMH Alzheimer's disease, CKD III, DM, HTN, seizure disorder. [facility] in [facility]  3/07 Admitted for sepsis, DKA 3/08 Unresponsive, intubated, transferred to ICU  Pt remains sedated on vent, LP performed today  Noted free water 200 mL q 8 hours via OG ordered today secondary to hypernatremia. OG tube in stomach  Unable to obtain diet and weight history at this time  Labs: sodium 151 (H), Creatinine 1.72, phosphorus 1.9 (L), calcium 10.8 (H) Meds: colace, ss novolog, novolog q 4 hours, levemir, LR at 125 ml/hr, MVI with Minerals, miralax   Diet Order:   Diet Order            Diet NPO time specified  Diet effective now                 EDUCATION NEEDS:   Education needs have been addressed  Skin:  Skin Assessment: Skin Integrity Issues: Skin Integrity Issues:: Stage II Stage II: sacrum Other: necrotic wound to R. great toe  Last BM:  no documented BM  Height:   Ht Readings from Last 1 Encounters:  10/04/20 6' (1.829 m)    Weight:   Wt Readings from Last 1 Encounters:  10/23/2020 84.5 kg    BMI:  Body mass index is 25.27 kg/m.  Estimated Nutritional Needs:   Kcal:  2100-2400 kcals  Protein:  125-150 g  Fluid:  >/= 2  L    Kerman Passey MS, RDN, LDN, CNSC Registered Dietitian III Clinical Nutrition RD Pager and On-Call Pager Number Located in [place]

## 2020-10-04 NOTE — Consult Note (Signed)
[facility] for Infectious Disease    Date of Admission:  10/22/2020     Total days of antibiotics                Reason for Consult: Sepsis / Osteomyelitis    Referring Provider: Tacy Learn  Primary Care Provider: Patient, No Pcp Per   ASSESSMENT:  Mr. Barren is a 70 y/o male admitted with worsening altered mental status requiring emergent intubation with sepsis and osteomyelitis of the right great toe proximal and distal phalanx. There is concern for seizures with history of seizure disorder. Lumbar puncture attempt was unsuccessful. Blood cultures without growth thus far and respiratory culture pending. Will need ortho evaluation of right foot to determine need for surgical intervention once stable. Will continue with broad spectrum antibiotics and change vancomcyin to daptomycin secondary to renal function and continue with pip/tazo. Monitor cultures for organism identification and narrow antibiotics as able.   PLAN:  1. Continue with daptomycin and pip/tazo. 2. Sepsis and respiratory management per CCM.  3. Will need ortho evaluation once stable.   Principal Problem:   Altered mental status Active Problems:   Hypertension   Dementia (HCC)   Acute renal failure superimposed on stage 3b chronic kidney disease (HCC)   Sepsis ([place])   DKA (diabetic ketoacidosis) (HCC)   Ulcer of great toe, right, with unspecified severity ([place])   Unresponsive   . Chlorhexidine Gluconate Cloth  6 each Topical Daily  . docusate  100 mg Per Tube BID  . enoxaparin (LOVENOX) injection  30 mg Subcutaneous Q24H  . feeding supplement (PROSource TF)  45 mL Per Tube QID  . free water  200 mL Per Tube Q6H  . insulin aspart  0-15 Units Subcutaneous Q4H  . insulin aspart  2 Units Subcutaneous Q4H  . insulin detemir  15 Units Subcutaneous BID  . multivitamin with minerals  1 tablet Per Tube Daily  . mupirocin ointment  1 application Topical BID  . pantoprazole (PROTONIX) IV  40 mg Intravenous  Daily  . polyethylene glycol  17 g Per Tube Daily  . tamsulosin  0.4 mg Oral QPC breakfast     HPI: DYLAND PANUCO is a 70 y.o. male with previous medical history with Alzheimer's disease, Type 2 diabetes, CKD Stage III, and seizure disorder admitted from his SNF at [facility] for altered mental status and abdominal pain.   Mr. Yordy was treated for UTI from 2/16/-2/23 with Keflex. Found to have glucose >600 at the facility this morning. On arrival to the ED was febrile with temperature of 101.7 and tachycardic with HR in the 140s. COVID negative. Chest x-ray with mild left basilar opacity reflecting atelectasis or infection. CT head without intracranial abnormality. X-ray right foot with concern for osteomyelitis. MRI right foot with osteomyelitis of the first distal phalanx and first proximal phalanx without drainable fluid collection. CT abdomen/pelvis with dense consolidation of the left greater than right bilateral lung bases consistent with infection or aspiration. Initially started on vancomycin, cefepime and flagyl.   Rapid response called earlier this morning because of decreased responsiveness and severe sepsis. Required emergent intubation for airway protection. EEG performed with moderate to severe encephalopathy of non-specific etiology. Lumbar puncture has been attempted.   Mr. Berrios is sedated following intubation and history is obtained from chart review. Continues to remain febrile since admission with max temperature of 102.6 and refractory to Tylenol per nursing. Blood cultures are without growth and respiratory culture is pending.  Review of Systems: Review of Systems  Unable to perform ROS: Intubated     Past Medical History:  Diagnosis Date  . Alzheimer's disease ([place])   . BPH (benign prostatic hyperplasia)   . CKD (chronic kidney disease)   . Diabetes mellitus    Type II, diagnosed 2011, not on insulin; admitted in 2011 for hyperglycemia  . HTN (hypertension)   .  Seizure disorder ([place])    diagnosed in childhood, last seizure was years ago    Social History   Tobacco Use  . Smoking status: Never Smoker  . Smokeless tobacco: Never Used  Substance Use Topics  . Alcohol use: No    Alcohol/week: 0.0 standard drinks  . Drug use: No    Family History  Problem Relation Age of Onset  . Diabetes Mother   . Hypertension Mother     Not on File  OBJECTIVE: Blood pressure (!) 118/107, pulse (!) 151, temperature (!) 102.5 F (39.2 C), temperature source Esophageal, resp. rate (!) 21, height 6' (1.829 m), weight 84.5 kg, SpO2 95 %.  Physical Exam Constitutional:      Appearance: He is ill-appearing and diaphoretic.     Interventions: He is sedated and intubated.  Cardiovascular:     Rate and Rhythm: Regular rhythm. Tachycardia present.     Heart sounds: No murmur heard.   Pulmonary:     Effort: Pulmonary effort is normal. He is intubated.  Abdominal:     General: Bowel sounds are normal.     Palpations: Abdomen is soft.  Skin:    General: Skin is warm.      Lab Results Lab Results  Component Value Date   WBC 13.1 (H) 10/04/2020   HGB 11.9 (L) 10/04/2020   HCT 35.0 (L) 10/04/2020   MCV 98.2 10/04/2020   PLT 233 10/04/2020    Lab Results  Component Value Date   CREATININE 1.72 (H) 10/04/2020   BUN 32 (H) 10/04/2020   NA 151 (H) 10/04/2020   K 3.9 10/04/2020   CL 112 (H) 10/04/2020   CO2 26 10/04/2020    Lab Results  Component Value Date   ALT 12 09/28/2020   AST 23 10/19/2020   ALKPHOS 72 10/13/2020   BILITOT 1.0 09/27/2020     Microbiology: Recent Results (from the past 240 hour(s))  Resp Panel by RT-PCR (Flu A&B, Covid) Nasopharyngeal Swab     Status: None   Collection Time: 10/20/2020  2:55 PM   Specimen: Nasopharyngeal Swab; Nasopharyngeal(NP) swabs in vial transport medium  Result Value Ref Range Status   SARS Coronavirus 2 by RT PCR NEGATIVE NEGATIVE Final    Comment: (NOTE) SARS-CoV-2 target nucleic acids  are NOT DETECTED.  The SARS-CoV-2 RNA is generally detectable in upper respiratory specimens during the acute phase of infection. The lowest concentration of SARS-CoV-2 viral copies this assay can detect is 138 copies/mL. A negative result does not preclude SARS-Cov-2 infection and should not be used as the sole basis for treatment or other patient management decisions. A negative result may occur with  improper specimen collection/handling, submission of specimen other than nasopharyngeal swab, presence of viral mutation(s) within the areas targeted by this assay, and inadequate number of viral copies(<138 copies/mL). A negative result must be combined with clinical observations, patient history, and epidemiological information. The expected result is Negative.  Fact Sheet for Patients:  EntrepreneurPulse.com.au  Fact Sheet for Healthcare Providers:  IncredibleEmployment.be  This test is no t yet approved or cleared by the  [place] Architectural technologist and  has been authorized for detection and/or diagnosis of SARS-CoV-2 by FDA under an Print production planner (EUA). This EUA will remain  in effect (meaning this test can be used) for the duration of the COVID-19 declaration under Section 564(b)(1) of the Act, 21 U.S.C.section 360bbb-3(b)(1), unless the authorization is terminated  or revoked sooner.       Influenza A by PCR NEGATIVE NEGATIVE Final   Influenza B by PCR NEGATIVE NEGATIVE Final    Comment: (NOTE) The Xpert Xpress SARS-CoV-2/FLU/RSV plus assay is intended as an aid in the diagnosis of influenza from Nasopharyngeal swab specimens and should not be used as a sole basis for treatment. Nasal washings and aspirates are unacceptable for Xpert Xpress SARS-CoV-2/FLU/RSV testing.  Fact Sheet for Patients: EntrepreneurPulse.com.au  Fact Sheet for Healthcare Providers: IncredibleEmployment.be  This test is  not yet approved or cleared by the [place] FDA and has been authorized for detection and/or diagnosis of SARS-CoV-2 by FDA under an Emergency Use Authorization (EUA). This EUA will remain in effect (meaning this test can be used) for the duration of the COVID-19 declaration under Section 564(b)(1) of the Act, 21 U.S.C. section 360bbb-3(b)(1), unless the authorization is terminated or revoked.  Performed at [facility], [place] [address], [place], [place] [address]   Blood culture (routine x 2)     Status: None (Preliminary result)   Collection Time: 10/09/2020  6:59 PM   Specimen: BLOOD  Result Value Ref Range Status   Specimen Description BLOOD SITE NOT SPECIFIED  Final   Special Requests   Final    BOTTLES DRAWN AEROBIC AND ANAEROBIC Blood Culture results may not be optimal due to an inadequate volume of blood received in culture bottles   Culture   Final    NO GROWTH < 24 HOURS Performed at [facility], [place] [address] [place], [place] [address]    Report Status PENDING  Incomplete  Blood culture (routine x 2)     Status: None (Preliminary result)   Collection Time: 09/27/2020  7:00 PM   Specimen: BLOOD  Result Value Ref Range Status   Specimen Description BLOOD LEFT ANTECUBITAL  Final   Special Requests   Final    BOTTLES DRAWN AEROBIC AND ANAEROBIC Blood Culture adequate volume   Culture   Final    NO GROWTH < 24 HOURS Performed at [facility], [place] [address] [place], [place] [address]    Report Status PENDING  Incomplete  Culture, Respiratory w Gram Stain     Status: None (Preliminary result)   Collection Time: 10/04/20  8:24 AM   Specimen: Tracheal Aspirate; Respiratory  Result Value Ref Range Status   Specimen Description TRACHEAL ASPIRATE  Final   Special Requests NONE  Final   Gram Stain   Final    MODERATE WBC PRESENT,BOTH PMN AND MONONUCLEAR RARE SQUAMOUS EPITHELIAL CELLS PRESENT FEW GRAM POSITIVE RODS RARE BUDDING YEAST SEEN Performed at  [facility], [place] [address], [place], [place] [address]    Culture PENDING  Incomplete   Report Status PENDING  Incomplete  MRSA PCR Screening     Status: None   Collection Time: 10/04/20  8:56 AM   Specimen: Nasopharyngeal  Result Value Ref Range Status   MRSA by PCR NEGATIVE NEGATIVE Final    Comment:        The GeneXpert MRSA Assay (FDA approved for NASAL specimens only), is one component of a comprehensive  MRSA colonization surveillance program. It is not intended to diagnose MRSA infection nor to guide or monitor treatment for MRSA infections. Performed at [facility], [place] [address], [place], [place] [address]      Terri Piedra, [place] for Infectious Disease [facility]  10/04/2020  2:28 PM

## 2020-10-04 NOTE — Telephone Encounter (Signed)
Lovena Le, Dr. March Rummage is the on call provider for this week.

## 2020-10-04 NOTE — Progress Notes (Signed)
Inpatient Diabetes Program Recommendations  AACE/ADA: New Consensus Statement on Inpatient Glycemic Control   Target Ranges:  Prepandial:   less than 140 mg/dL      Peak postprandial:   less than 180 mg/dL (1-2 hours)      Critically ill patients:  140 - 180 mg/dL   Results for LUIAN, SCHUMPERT (MRN [id number]) as of 10/04/2020 12:03  Ref. Range 10/04/2020 06:31 10/04/2020 07:26 10/04/2020 08:39 10/04/2020 09:47 10/04/2020 11:40  Glucose-Capillary Latest Ref Range: 70 - 99 mg/dL 161 (H) 137 (H) 100 (H) 114 (H) 159 (H)  Results for WILMOT, QUEVEDO (MRN [id number]) as of 10/04/2020 12:03  Ref. Range 10/26/2020 14:42 10/24/2020 19:37  Glucose Latest Ref Range: 70 - 99 mg/dL 605 (HH) 532 (HH)  Hemoglobin A1C Latest Ref Range: 4.8 - 5.6 %  8.8 (H)   Review of Glycemic Control  Diabetes history: DM2 Outpatient Diabetes medications: None Current orders for Inpatient glycemic control: Levemir 15 units BID, Novolog 0-15 units Q4H, Novolog 2 units Q4H; Vital @ 40 ml/hr  Inpatient Diabetes Program Recommendations:    HbgA1C:  A1C 8.8% on 10/16/2020 indicating an average glucose of 206 mg/dl over the past 2-3 months.   NOTE: Noted consult for diabetes coordinator. Per chart, patient has hx of DM2 and not on any DM medications. Patient from [facility] with noted hx of Alzheimers. Per H&P, nursing staff at [facility] reported glucose over 600 mg/dl yesterday prior to patient being brought to the hospital. Patient's initial glucose was 605 mg/dl on 10/18/2020 and patient was started on IV insulin which was transitioned to SQ insulin today. Patient received Levemir 15 units at 11:32 today. Noted patient was intubated early this morning and now ordered tube feeding. Will follow along while inpatient.  Thanks, Barnie Alderman, RN, MSN, CDE Diabetes Coordinator Inpatient Diabetes Program [phone] (Team Pager from 8am to 5pm)

## 2020-10-04 NOTE — Telephone Encounter (Signed)
Dr Tacy Learn requesting consult [phone]) calling to request inpatient hospital consult. Patient has osteomyelitis, and now has sepsis. Requesting that McDonald come to hospital to evaluate patient to see if surgery is needed.

## 2020-10-04 NOTE — Procedures (Signed)
Intubation Procedure Note  Tony [name]  [id number]  Dec 09, 1950  Date:10/04/20  Time:8:09 AM   Provider Performing:Stephanie M Ayesha Rumpf    Procedure: Intubation (31500)  Indication(s) Respiratory Failure  Consent Unable to obtain consent due to emergent nature of procedure.   Anesthesia Etomidate (20mg ) and Rocuronium (50mg )   Time Out Verified patient identification, verified procedure, site/side was marked, verified correct patient position, special equipment/implants available, medications/allergies/relevant history reviewed, required imaging and test results available.   Sterile Technique Usual hand hygeine, masks, and gloves were used   Procedure Description Patient positioned in bed supine.  Sedation given as noted above.  Patient was intubated with endotracheal tube using Glidescope.  View was Grade 1 full glottis .  Number of attempts was 1.  Colorimetric CO2 detector was consistent with tracheal placement.   Complications/Tolerance None; patient tolerated the procedure well. Chest X-ray is ordered to verify placement.   EBL Minimal   Specimen(s) None  The entire procedure was supervised/proctored by Dr. Jacky Kindle.  Lestine Mount, PA-C [facility] 10/04/20 8:11 AM  Please see [email or web address] for pager details.

## 2020-10-04 NOTE — Consult Note (Signed)
Reason for Consult: Right great toe osteomyelitis Referring Physician: Dr. Veatrice Kells is an 70 y.o. male.  HPI: Patient intubated. Chart and records reviewed. Initially presented to the ED from rehab facility with concern for AMS. Patient has had chronic appearing right great toe wound since December, was healing well at last visit.   Rapidly progressing sepsis today. Now intubated.  Past Medical History:  Diagnosis Date  . Alzheimer's disease ([place])   . BPH (benign prostatic hyperplasia)   . CKD (chronic kidney disease)   . Diabetes mellitus    Type II, diagnosed 2011, not on insulin; admitted in 2011 for hyperglycemia  . HTN (hypertension)   . Seizure disorder ([place])    diagnosed in childhood, last seizure was years ago    Past Surgical History:  Procedure Laterality Date  . NO PAST SURGERIES    . TRANSURETHRAL RESECTION OF PROSTATE N/A 07/10/2019   Procedure: TRANSURETHRAL RESECTION OF THE PROSTATE (TURP);  Surgeon: Lucas Mallow, MD;  Location: [facility];  Service: Urology;  Laterality: N/A;    Family History  Problem Relation Age of Onset  . Diabetes Mother   . Hypertension Mother     Social History:  reports that he has never smoked. He has never used smokeless tobacco. He reports that he does not drink alcohol and does not use drugs.  Allergies: No Known Allergies  Medications: I have reviewed the patient's current medications.  Results for orders placed or performed during the hospital encounter of 10/02/2020 (from the past 48 hour(s))  CBG monitoring, ED     Status: Abnormal   Collection Time: 10/25/2020  2:20 PM  Result Value Ref Range   Glucose-Capillary 529 (HH) 70 - 99 mg/dL    Comment: Glucose reference range applies only to samples taken after fasting for at least 8 hours.  CBC with Differential     Status: Abnormal   Collection Time: 10/19/2020  2:42 PM  Result Value Ref Range   WBC 21.1 (H) 4.0 - 10.5 K/uL   RBC 4.70 4.22 - 5.81 MIL/uL    Hemoglobin 14.7 13.0 - 17.0 g/dL   HCT 46.2 39.0 - 52.0 %   MCV 98.3 80.0 - 100.0 fL   MCH 31.3 26.0 - 34.0 pg   MCHC 31.8 30.0 - 36.0 g/dL   RDW 12.3 11.5 - 15.5 %   Platelets 313 150 - 400 K/uL   nRBC 0.0 0.0 - 0.2 %   Neutrophils Relative % 87 %   Neutro Abs 18.3 (H) 1.7 - 7.7 K/uL   Lymphocytes Relative 6 %   Lymphs Abs 1.3 0.7 - 4.0 K/uL   Monocytes Relative 6 %   Monocytes Absolute 1.2 (H) 0.1 - 1.0 K/uL   Eosinophils Relative 0 %   Eosinophils Absolute 0.0 0.0 - 0.5 K/uL   Basophils Relative 0 %   Basophils Absolute 0.1 0.0 - 0.1 K/uL   Immature Granulocytes 1 %   Abs Immature Granulocytes 0.19 (H) 0.00 - 0.07 K/uL    Comment: Performed at [facility], [address] [place],  [address]  Comprehensive metabolic panel     Status: Abnormal   Collection Time: 10/23/2020  2:42 PM  Result Value Ref Range   Sodium 148 (H) 135 - 145 mmol/L   Potassium 5.0 3.5 - 5.1 mmol/L   Chloride 106 98 - 111 mmol/L   CO2 22 22 - 32 mmol/L   Glucose, Bld 605 (HH) 70 - 99  mg/dL    Comment: Glucose reference range applies only to samples taken after fasting for at least 8 hours. CRITICAL RESULT CALLED TO, READ BACK BY AND VERIFIED WITH: T.GLOSSON,RN 1547 10/05/2020 CLARK,S    BUN 52 (H) 8 - 23 mg/dL   Creatinine, Ser 2.93 (H) 0.61 - 1.24 mg/dL   Calcium 10.5 (H) 8.9 - 10.3 mg/dL   Total Protein 8.2 (H) 6.5 - 8.1 g/dL   Albumin 3.1 (L) 3.5 - 5.0 g/dL   AST 23 15 - 41 U/L   ALT 12 0 - 44 U/L   Alkaline Phosphatase 72 38 - 126 U/L   Total Bilirubin 1.0 0.3 - 1.2 mg/dL   GFR, Estimated 22 (L) >60 mL/min    Comment: (NOTE) Calculated using the CKD-EPI Creatinine Equation (2021)    Anion gap 20 (H) 5 - 15    Comment: Performed at [facility], [place] [address] [place], [place] [address]  Lactic acid, plasma     Status: Abnormal   Collection Time: 10/01/2020  2:42 PM  Result Value Ref Range   Lactic Acid, Venous 5.3 (HH) 0.5 - 1.9 mmol/L    Comment: CRITICAL RESULT CALLED TO,  READ BACK BY AND VERIFIED WITH: T.GLOSSON,RN 1547 10/02/2020 CLARK,S Performed at [facility], [place] [address] [place], [place] [address]   Resp Panel by RT-PCR (Flu A&B, Covid) Nasopharyngeal Swab     Status: None   Collection Time: 10/11/2020  2:55 PM   Specimen: Nasopharyngeal Swab; Nasopharyngeal(NP) swabs in vial transport medium  Result Value Ref Range   SARS Coronavirus 2 by RT PCR NEGATIVE NEGATIVE    Comment: (NOTE) SARS-CoV-2 target nucleic acids are NOT DETECTED.  The SARS-CoV-2 RNA is generally detectable in upper respiratory specimens during the acute phase of infection. The lowest concentration of SARS-CoV-2 viral copies this assay can detect is 138 copies/mL. A negative result does not preclude SARS-Cov-2 infection and should not be used as the sole basis for treatment or other patient management decisions. A negative result may occur with  improper specimen collection/handling, submission of specimen other than nasopharyngeal swab, presence of viral mutation(s) within the areas targeted by this assay, and inadequate number of viral copies(<138 copies/mL). A negative result must be combined with clinical observations, patient history, and epidemiological information. The expected result is Negative.  Fact Sheet for Patients:  EntrepreneurPulse.com.au  Fact Sheet for Healthcare Providers:  IncredibleEmployment.be  This test is no t yet approved or cleared by the [place] FDA and  has been authorized for detection and/or diagnosis of SARS-CoV-2 by FDA under an Emergency Use Authorization (EUA). This EUA will remain  in effect (meaning this test can be used) for the duration of the COVID-19 declaration under Section 564(b)(1) of the Act, 21 U.S.C.section 360bbb-3(b)(1), unless the authorization is terminated  or revoked sooner.       Influenza A by PCR NEGATIVE NEGATIVE   Influenza B by PCR NEGATIVE NEGATIVE    Comment:  (NOTE) The Xpert Xpress SARS-CoV-2/FLU/RSV plus assay is intended as an aid in the diagnosis of influenza from Nasopharyngeal swab specimens and should not be used as a sole basis for treatment. Nasal washings and aspirates are unacceptable for Xpert Xpress SARS-CoV-2/FLU/RSV testing.  Fact Sheet for Patients: EntrepreneurPulse.com.au  Fact Sheet for Healthcare Providers: IncredibleEmployment.be  This test is not yet approved or cleared by the [place] FDA and has been authorized for detection and/or diagnosis of SARS-CoV-2 by FDA under an Emergency Use Authorization (  EUA). This EUA will remain in effect (meaning this test can be used) for the duration of the COVID-19 declaration under Section 564(b)(1) of the Act, 21 U.S.C. section 360bbb-3(b)(1), unless the authorization is terminated or revoked.  Performed at [facility], [place] [address], [place], [place] [address]   Urinalysis, Routine w reflex microscopic Urine, Catheterized     Status: Abnormal   Collection Time: 10/04/2020  5:22 PM  Result Value Ref Range   Color, Urine AMBER (A) YELLOW    Comment: BIOCHEMICALS MAY BE AFFECTED BY COLOR   APPearance TURBID (A) CLEAR   Specific Gravity, Urine 1.018 1.005 - 1.030   pH 5.0 5.0 - 8.0   Glucose, UA >=500 (A) NEGATIVE mg/dL   Hgb urine dipstick MODERATE (A) NEGATIVE   Bilirubin Urine NEGATIVE NEGATIVE   Ketones, ur 5 (A) NEGATIVE mg/dL   Protein, ur 100 (A) NEGATIVE mg/dL   Nitrite NEGATIVE NEGATIVE   Leukocytes,Ua LARGE (A) NEGATIVE   RBC / HPF 0-5 0 - 5 RBC/hpf   WBC, UA >50 (H) 0 - 5 WBC/hpf   Bacteria, UA NONE SEEN NONE SEEN   Mucus PRESENT    Budding Yeast PRESENT    Non Squamous Epithelial 0-5 (A) NONE SEEN    Comment: Performed at [facility], [address], [place], [place] [address]  Osmolality, urine     Status: None   Collection Time: 10/01/2020  5:22 PM  Result Value Ref Range   Osmolality, Ur 511 300 - 900  mOsm/kg    Comment: Performed at [facility] [address], [place], [place] [address]  Lactic acid, plasma     Status: Abnormal   Collection Time: 09/27/2020  5:27 PM  Result Value Ref Range   Lactic Acid, Venous 5.8 (HH) 0.5 - 1.9 mmol/L    Comment: CRITICAL VALUE NOTED.  VALUE IS CONSISTENT WITH PREVIOUSLY REPORTED AND CALLED VALUE. Performed at [facility], [place] [address], [place], [place] [address]   Blood culture (routine x 2)     Status: None (Preliminary result)   Collection Time: 10/09/2020  6:59 PM   Specimen: BLOOD  Result Value Ref Range   Specimen Description BLOOD SITE NOT SPECIFIED    Special Requests      BOTTLES DRAWN AEROBIC AND ANAEROBIC Blood Culture results may not be optimal due to an inadequate volume of blood received in culture bottles   Culture      NO GROWTH < 24 HOURS Performed at [facility], [place] [address] [place], [place] [address]    Report Status PENDING   Blood culture (routine x 2)     Status: None (Preliminary result)   Collection Time: 10/15/2020  7:00 PM   Specimen: BLOOD  Result Value Ref Range   Specimen Description BLOOD LEFT ANTECUBITAL    Special Requests      BOTTLES DRAWN AEROBIC AND ANAEROBIC Blood Culture adequate volume   Culture      NO GROWTH < 24 HOURS Performed at [facility], [place] [address], [place], [place] [address]    Report Status PENDING   Basic metabolic panel     Status: Abnormal   Collection Time: 10/14/2020  7:37 PM  Result Value Ref Range   Sodium 149 (H) 135 - 145 mmol/L   Potassium 5.0 3.5 - 5.1 mmol/L   Chloride 109 98 - 111 mmol/L   CO2 25 22 - 32 mmol/L   Glucose,  Bld 532 (HH) 70 - 99 mg/dL    Comment: Glucose reference range applies only to samples taken after fasting for at least 8 hours. CRITICAL RESULT CALLED TO, READ BACK BY AND VERIFIED WITH: C.HARRIS,RN 2109 10/02/2020 M.CAMPBELL    BUN 47 (H) 8 - 23 mg/dL   Creatinine, Ser 2.55 (H) 0.61 - 1.24 mg/dL   Calcium 9.9 8.9 -  10.3 mg/dL   GFR, Estimated 26 (L) >60 mL/min    Comment: (NOTE) Calculated using the CKD-EPI Creatinine Equation (2021)    Anion gap 15 5 - 15    Comment: Performed at [facility] [address], [place], [place] [address]  Beta-hydroxybutyric acid     Status: Abnormal   Collection Time: 10/10/2020  7:37 PM  Result Value Ref Range   Beta-Hydroxybutyric Acid 1.20 (H) 0.05 - 0.27 mmol/L    Comment: Performed at [facility] [address], [place], [place] [address]  Hemoglobin A1c     Status: Abnormal   Collection Time: 10/01/2020  7:37 PM  Result Value Ref Range   Hgb A1c MFr Bld 8.8 (H) 4.8 - 5.6 %    Comment: (NOTE) Pre diabetes:          5.7%-6.4%  Diabetes:              >6.4%  Glycemic control for   <7.0% adults with diabetes    Mean Plasma Glucose 205.86 mg/dL    Comment: Performed at [facility] [address], [place], [place] [address]  Troponin I (High Sensitivity)     Status: Abnormal   Collection Time: 09/28/2020  7:53 PM  Result Value Ref Range   Troponin I (High Sensitivity) 22 (H) <18 ng/L    Comment: (NOTE) Elevated high sensitivity troponin I (hsTnI) values and significant  changes across serial measurements may suggest ACS but many other  chronic and acute conditions are known to elevate hsTnI results.  Refer to the "Links" section for chest pain algorithms and additional  guidance. Performed at [facility], [place] [address] [place], [place] [address]   CBG monitoring, ED     Status: Abnormal   Collection Time: 10/12/2020  8:15 PM  Result Value Ref Range   Glucose-Capillary 441 (H) 70 - 99 mg/dL    Comment: Glucose reference range applies only to samples taken after fasting for at least 8 hours.  CBG monitoring, ED     Status: Abnormal   Collection Time: 09/27/2020  8:57 PM  Result Value Ref Range   Glucose-Capillary 451 (H) 70 - 99 mg/dL    Comment: Glucose reference range applies only to samples taken after fasting for at least 8  hours.  CBG monitoring, ED     Status: Abnormal   Collection Time: 10/20/2020  9:45 PM  Result Value Ref Range   Glucose-Capillary 344 (H) 70 - 99 mg/dL    Comment: Glucose reference range applies only to samples taken after fasting for at least 8 hours.  CBG monitoring, ED     Status: Abnormal   Collection Time: 10/18/2020 10:49 PM  Result Value Ref Range   Glucose-Capillary 332 (H) 70 - 99 mg/dL    Comment: Glucose reference range applies only to samples taken after fasting for at least 8 hours.  Glucose, capillary     Status: Abnormal   Collection Time: 10/14/2020 11:45 PM  Result Value Ref Range   Glucose-Capillary 303 (H) 70 - 99 mg/dL  Comment: Glucose reference range applies only to samples taken after fasting for at least 8 hours.  Glucose, capillary     Status: Abnormal   Collection Time: 10/04/20  1:12 AM  Result Value Ref Range   Glucose-Capillary 222 (H) 70 - 99 mg/dL    Comment: Glucose reference range applies only to samples taken after fasting for at least 8 hours.   Comment 1 Notify RN    Comment 2 Document in Chart   Basic metabolic panel     Status: Abnormal   Collection Time: 10/04/20  2:02 AM  Result Value Ref Range   Sodium 151 (H) 135 - 145 mmol/L   Potassium 3.9 3.5 - 5.1 mmol/L   Chloride 114 (H) 98 - 111 mmol/L   CO2 25 22 - 32 mmol/L   Glucose, Bld 254 (H) 70 - 99 mg/dL    Comment: Glucose reference range applies only to samples taken after fasting for at least 8 hours.   BUN 43 (H) 8 - 23 mg/dL   Creatinine, Ser 2.07 (H) 0.61 - 1.24 mg/dL   Calcium 10.7 (H) 8.9 - 10.3 mg/dL   GFR, Estimated 34 (L) >60 mL/min    Comment: (NOTE) Calculated using the CKD-EPI Creatinine Equation (2021)    Anion gap 12 5 - 15    Comment: Performed at [facility] [address] [place], [place] [address]  Troponin I (High Sensitivity)     Status: Abnormal   Collection Time: 10/04/20  2:02 AM  Result Value Ref Range   Troponin I (High Sensitivity) 26 (H) <18 ng/L     Comment: (NOTE) Elevated high sensitivity troponin I (hsTnI) values and significant  changes across serial measurements may suggest ACS but many other  chronic and acute conditions are known to elevate hsTnI results.  Refer to the "Links" section for chest pain algorithms and additional  guidance. Performed at [facility], [place] [address], [place], [place] [address]   Lactic acid, plasma     Status: Abnormal   Collection Time: 10/04/20  2:02 AM  Result Value Ref Range   Lactic Acid, Venous 6.3 (HH) 0.5 - 1.9 mmol/L    Comment: CRITICAL VALUE NOTED.  VALUE IS CONSISTENT WITH PREVIOUSLY REPORTED AND CALLED VALUE. Performed at [facility], [place] [address] [place] [address] [place], [place] [address]   Glucose, capillary     Status: Abnormal   Collection Time: 10/04/20  2:09 AM  Result Value Ref Range   Glucose-Capillary 217 (H) 70 - 99 mg/dL    Comment: Glucose reference range applies only to samples taken after fasting for at least 8 hours.  Glucose, capillary     Status: Abnormal   Collection Time: 10/04/20  2:45 AM  Result Value Ref Range   Glucose-Capillary 183 (H) 70 - 99 mg/dL    Comment: Glucose reference range applies only to samples taken after fasting for at least 8 hours.  Glucose, capillary     Status: Abnormal   Collection Time: 10/04/20  4:25 AM  Result Value Ref Range   Glucose-Capillary 143 (H) 70 - 99 mg/dL    Comment: Glucose reference range applies only to samples taken after fasting for at least 8 hours.  Glucose, capillary     Status: Abnormal   Collection Time: 10/04/20  5:21 AM  Result Value Ref Range   Glucose-Capillary 165 (H) 70 - 99 mg/dL    Comment: Glucose reference range applies only to samples taken after fasting  for at least 8 hours.  Osmolality, urine     Status: None   Collection Time: 10/04/20  5:38 AM  Result Value Ref Range   Osmolality, Ur 569 300 - 900 mOsm/kg    Comment: Performed at [facility] [address] [place], [place]  [address]  Glucose, capillary     Status: Abnormal   Collection Time: 10/04/20  6:31 AM  Result Value Ref Range   Glucose-Capillary 161 (H) 70 - 99 mg/dL    Comment: Glucose reference range applies only to samples taken after fasting for at least 8 hours.   Comment 1 Notify RN    Comment 2 Document in Chart   CBC     Status: Abnormal   Collection Time: 10/04/20  6:32 AM  Result Value Ref Range   WBC 13.1 (H) 4.0 - 10.5 K/uL   RBC 4.43 4.22 - 5.81 MIL/uL   Hemoglobin 13.6 13.0 - 17.0 g/dL   HCT 43.5 39.0 - 52.0 %   MCV 98.2 80.0 - 100.0 fL   MCH 30.7 26.0 - 34.0 pg   MCHC 31.3 30.0 - 36.0 g/dL   RDW 12.3 11.5 - 15.5 %   Platelets 233 150 - 400 K/uL   nRBC 0.0 0.0 - 0.2 %    Comment: Performed at [facility], [place] [address] [place], [place] [address]  Basic metabolic panel     Status: Abnormal   Collection Time: 10/04/20  6:32 AM  Result Value Ref Range   Sodium 152 (H) 135 - 145 mmol/L   Potassium 3.8 3.5 - 5.1 mmol/L   Chloride 115 (H) 98 - 111 mmol/L   CO2 25 22 - 32 mmol/L   Glucose, Bld 168 (H) 70 - 99 mg/dL    Comment: Glucose reference range applies only to samples taken after fasting for at least 8 hours.   BUN 37 (H) 8 - 23 mg/dL   Creatinine, Ser 1.69 (H) 0.61 - 1.24 mg/dL   Calcium 10.4 (H) 8.9 - 10.3 mg/dL   GFR, Estimated 43 (L) >60 mL/min    Comment: (NOTE) Calculated using the CKD-EPI Creatinine Equation (2021)    Anion gap 12 5 - 15    Comment: Performed at [facility] [address], [place], [place] [address]  Beta-hydroxybutyric acid     Status: None   Collection Time: 10/04/20  6:32 AM  Result Value Ref Range   Beta-Hydroxybutyric Acid 0.12 0.05 - 0.27 mmol/L    Comment: Performed at [facility] [address], [place], [place] [address]  Lactic acid, plasma     Status: Abnormal   Collection Time: 10/04/20  6:32 AM  Result Value Ref Range   Lactic Acid, Venous 5.4 (HH) 0.5 - 1.9 mmol/L    Comment: CRITICAL VALUE NOTED.  VALUE IS  CONSISTENT WITH PREVIOUSLY REPORTED AND CALLED VALUE. Performed at [facility], [place] [address], [place], [place] [address]   CK     Status: None   Collection Time: 10/04/20  6:32 AM  Result Value Ref Range   Total CK 131 49 - 397 U/L    Comment: Performed at [facility], [place] [address] [place], [place] [address]  Magnesium     Status: None   Collection Time: 10/04/20  6:32 AM  Result Value Ref Range   Magnesium 1.7 1.7 - 2.4 mg/dL    Comment: Performed at [facility]  [address] [place], [place] [address]  Glucose, capillary     Status: Abnormal   Collection Time: 10/04/20  7:26 AM  Result Value Ref Range   Glucose-Capillary 137 (H) 70 - 99 mg/dL    Comment: Glucose reference range applies only to samples taken after fasting for at least 8 hours.   Comment 1 Notify RN    Comment 2 Document in Chart   Culture, Respiratory w Gram Stain     Status: None (Preliminary result)   Collection Time: 10/04/20  8:24 AM   Specimen: Tracheal Aspirate; Respiratory  Result Value Ref Range   Specimen Description TRACHEAL ASPIRATE    Special Requests NONE    Gram Stain      MODERATE WBC PRESENT,BOTH PMN AND MONONUCLEAR RARE SQUAMOUS EPITHELIAL CELLS PRESENT FEW GRAM POSITIVE RODS RARE BUDDING YEAST SEEN Performed at [facility], [place] [address], [place], [place] [address]    Culture PENDING    Report Status PENDING   Glucose, capillary     Status: Abnormal   Collection Time: 10/04/20  8:39 AM  Result Value Ref Range   Glucose-Capillary 100 (H) 70 - 99 mg/dL    Comment: Glucose reference range applies only to samples taken after fasting for at least 8 hours.  MRSA PCR Screening     Status: None   Collection Time: 10/04/20  8:56 AM   Specimen: Nasopharyngeal  Result Value Ref Range   MRSA by PCR NEGATIVE NEGATIVE    Comment:        The GeneXpert MRSA Assay (FDA approved for NASAL specimens only), is one component of a comprehensive MRSA  colonization surveillance program. It is not intended to diagnose MRSA infection nor to guide or monitor treatment for MRSA infections. Performed at [facility], [place] [address] [place],  [address]   I-STAT 7, (LYTES, BLD GAS, ICA, H+H)     Status: Abnormal   Collection Time: 10/04/20  9:23 AM  Result Value Ref Range   pH, Arterial 7.529 (H) 7.350 - 7.450   pCO2 arterial 32.1 32.0 - 48.0 mmHg   pO2, Arterial 174 (H) 83.0 - 108.0 mmHg   Bicarbonate 26.4 20.0 - 28.0 mmol/L   TCO2 27 22 - 32 mmol/L   O2 Saturation 100.0 %   Acid-Base Excess 5.0 (H) 0.0 - 2.0 mmol/L   Sodium 154 (H) 135 - 145 mmol/L   Potassium 3.4 (L) 3.5 - 5.1 mmol/L   Calcium, Ion 1.33 1.15 - 1.40 mmol/L   HCT 35.0 (L) 39.0 - 52.0 %   Hemoglobin 11.9 (L) 13.0 - 17.0 g/dL   Patient temperature 101.7 F    Collection site Radial    Drawn by Operator    Sample type ARTERIAL   Basic metabolic panel     Status: Abnormal   Collection Time: 10/04/20  9:27 AM  Result Value Ref Range   Sodium 151 (H) 135 - 145 mmol/L   Potassium 3.9 3.5 - 5.1 mmol/L   Chloride 112 (H) 98 - 111 mmol/L   CO2 26 22 - 32 mmol/L   Glucose, Bld 117 (H) 70 - 99 mg/dL    Comment: Glucose reference range applies only to samples taken after fasting for at least 8 hours.   BUN 32 (H) 8 - 23 mg/dL   Creatinine, Ser 1.72 (H) 0.61 - 1.24 mg/dL   Calcium 10.8 (H) 8.9 - 10.3 mg/dL   GFR, Estimated 42 (L) >60 mL/min    Comment: (NOTE)  Calculated using the CKD-EPI Creatinine Equation (2021)    Anion gap 13 5 - 15    Comment: Performed at [facility], [place] [address], [place], [place] [address]  Lactic acid, plasma     Status: Abnormal   Collection Time: 10/04/20  9:27 AM  Result Value Ref Range   Lactic Acid, Venous 5.2 (HH) 0.5 - 1.9 mmol/L    Comment: CRITICAL VALUE NOTED.  VALUE IS CONSISTENT WITH PREVIOUSLY REPORTED AND CALLED VALUE. Performed at [facility], [place] [address] [place], [place] [address]   Magnesium      Status: None   Collection Time: 10/04/20  9:27 AM  Result Value Ref Range   Magnesium 1.7 1.7 - 2.4 mg/dL    Comment: Performed at [facility] [address], [place], [place] [address]  Phosphorus     Status: Abnormal   Collection Time: 10/04/20  9:27 AM  Result Value Ref Range   Phosphorus 1.9 (L) 2.5 - 4.6 mg/dL    Comment: Performed at [facility] [address], [place], [place] [address]  Glucose, capillary     Status: Abnormal   Collection Time: 10/04/20  9:47 AM  Result Value Ref Range   Glucose-Capillary 114 (H) 70 - 99 mg/dL    Comment: Glucose reference range applies only to samples taken after fasting for at least 8 hours.  Glucose, capillary     Status: Abnormal   Collection Time: 10/04/20 11:40 AM  Result Value Ref Range   Glucose-Capillary 159 (H) 70 - 99 mg/dL    Comment: Glucose reference range applies only to samples taken after fasting for at least 8 hours.  Basic metabolic panel     Status: Abnormal   Collection Time: 10/04/20  1:53 PM  Result Value Ref Range   Sodium 152 (H) 135 - 145 mmol/L   Potassium 3.8 3.5 - 5.1 mmol/L   Chloride 113 (H) 98 - 111 mmol/L   CO2 25 22 - 32 mmol/L   Glucose, Bld 176 (H) 70 - 99 mg/dL    Comment: Glucose reference range applies only to samples taken after fasting for at least 8 hours.   BUN 31 (H) 8 - 23 mg/dL   Creatinine, Ser 1.74 (H) 0.61 - 1.24 mg/dL   Calcium 10.1 8.9 - 10.3 mg/dL   GFR, Estimated 42 (L) >60 mL/min    Comment: (NOTE) Calculated using the CKD-EPI Creatinine Equation (2021)    Anion gap 14 5 - 15    Comment: Performed at [facility] [address] [place], [place] [address]  Beta-hydroxybutyric acid     Status: Abnormal   Collection Time: 10/04/20  1:53 PM  Result Value Ref Range   Beta-Hydroxybutyric Acid 0.89 (H) 0.05 - 0.27 mmol/L    Comment: Performed at [facility] [address] [place], [place] [address]  Lactic acid, plasma     Status: Abnormal    Collection Time: 10/04/20  1:53 PM  Result Value Ref Range   Lactic Acid, Venous 6.1 (HH) 0.5 - 1.9 mmol/L    Comment: CRITICAL VALUE NOTED.  VALUE IS CONSISTENT WITH PREVIOUSLY REPORTED AND CALLED VALUE. Performed at [facility], [place] [address] [place], [place] [address]   Protime-INR     Status: Abnormal   Collection Time: 10/04/20  3:04 PM  Result Value Ref Range   Prothrombin Time 16.7 (H) 11.4 - 15.2 seconds   INR 1.4 (  H) 0.8 - 1.2    Comment: (NOTE) INR goal varies based on device and disease states. Performed at [facility], [place] [address] [place], [place] [address]   Valproic acid level     Status: Abnormal   Collection Time: 10/04/20  3:04 PM  Result Value Ref Range   Valproic Acid Lvl 44 (L) 50.0 - 100.0 ug/mL    Comment: Performed at [facility] [address], [place], [place] [address]  Ammonia     Status: None   Collection Time: 10/04/20  3:04 PM  Result Value Ref Range   Ammonia 21 9 - 35 umol/L    Comment: Performed at [facility] Lab, [facility] [address] [place], [place] [address]  Basic metabolic panel     Status: Abnormal   Collection Time: 10/04/20  3:17 PM  Result Value Ref Range   Sodium 151 (H) 135 - 145 mmol/L   Potassium 3.7 3.5 - 5.1 mmol/L   Chloride 113 (H) 98 - 111 mmol/L   CO2 24 22 - 32 mmol/L   Glucose, Bld 191 (H) 70 - 99 mg/dL    Comment: Glucose reference range applies only to samples taken after fasting for at least 8 hours.   BUN 33 (H) 8 - 23 mg/dL   Creatinine, Ser 1.61 (H) 0.61 - 1.24 mg/dL   Calcium 10.1 8.9 - 10.3 mg/dL   GFR, Estimated 46 (L) >60 mL/min    Comment: (NOTE) Calculated using the CKD-EPI Creatinine Equation (2021)    Anion gap 14 5 - 15    Comment: Performed at [facility] [address], [place], [place] [address]  CBC     Status: Abnormal   Collection Time: 10/04/20  3:17 PM  Result Value Ref Range   WBC 13.4 (H) 4.0 - 10.5 K/uL   RBC 3.94 (L) 4.22 - 5.81 MIL/uL   Hemoglobin 12.1  (L) 13.0 - 17.0 g/dL   HCT 39.5 39.0 - 52.0 %   MCV 100.3 (H) 80.0 - 100.0 fL   MCH 30.7 26.0 - 34.0 pg   MCHC 30.6 30.0 - 36.0 g/dL   RDW 12.4 11.5 - 15.5 %   Platelets 208 150 - 400 K/uL   nRBC 0.1 0.0 - 0.2 %    Comment: Performed at [facility], [place] [address] [place], [place] [address]  Magnesium     Status: None   Collection Time: 10/04/20  3:17 PM  Result Value Ref Range   Magnesium 1.9 1.7 - 2.4 mg/dL    Comment: Performed at [facility], [place] [address], [place], [place] [address]  Phosphorus     Status: Abnormal   Collection Time: 10/04/20  3:17 PM  Result Value Ref Range   Phosphorus 2.2 (L) 2.5 - 4.6 mg/dL    Comment: Performed at [facility] [address] [place], [place] [address]  Glucose, CSF     Status: Abnormal   Collection Time: 10/04/20  5:01 PM  Result Value Ref Range   Glucose, CSF 139 (H) 40 - 70 mg/dL    Comment: Performed at [facility] [address] [place], [place] [address]  Protein, CSF     Status: Abnormal   Collection Time: 10/04/20  5:01 PM  Result Value Ref Range   Total  Protein, CSF 113 (H) 15 - 45 mg/dL    Comment: Performed at [facility]  [address] [place], [place] [address]  CSF cell count with differential     Status: Abnormal   Collection Time: 10/04/20  5:01 PM  Result Value Ref Range   Tube # 1    Color, CSF COLORLESS COLORLESS   Appearance, CSF CLEAR CLEAR   Supernatant NOT INDICATED    RBC Count, CSF 48 (H) 0 /cu mm   WBC, CSF 6 (H) 0 - 5 /cu mm   Other Cells, CSF TOO FEW TO COUNT, SMEAR AVAILABLE FOR REVIEW     Comment: RARE LYMPHOCYTES Performed at [facility] [address], [place], [place] [address]   Glucose, capillary     Status: Abnormal   Collection Time: 10/04/20  5:49 PM  Result Value Ref Range   Glucose-Capillary 207 (H) 70 - 99 mg/dL    Comment: Glucose reference range applies only to samples taken after fasting for at least 8 hours.    CT ABDOMEN PELVIS WO  CONTRAST  Result Date: 10/04/2020 CLINICAL DATA:  Abdominal pain, intubation EXAM: CT ABDOMEN AND PELVIS WITHOUT CONTRAST TECHNIQUE: Multidetector CT imaging of the abdomen and pelvis was performed following the standard protocol without IV contrast. COMPARISON:  None. FINDINGS: Lower chest: Dense consolidation of the left greater than right bilateral lung bases. Hepatobiliary: No solid liver abnormality is seen. No gallstones, gallbladder wall thickening, or biliary dilatation. Pancreas: Unremarkable. No pancreatic ductal dilatation or surrounding inflammatory changes. Spleen: Normal in size without significant abnormality. Adrenals/Urinary Tract: Adrenal glands are unremarkable. Multiple low-attenuation renal lesions, incompletely characterized but likely cysts. Kidneys are otherwise normal, without renal calculi, solid lesion, or hydronephrosis. There is layering, high attenuation material within the urinary bladder lumen (series 3, image 79, series 7, image 92). Stomach/Bowel: Stomach is within normal limits. Appendix appears normal. No evidence of bowel wall thickening, distention, or inflammatory changes. Descending and sigmoid diverticulosis. Moderate burden of stool throughout the colon with large stool ball in the rectum measuring 7.9 cm. Vascular/Lymphatic: No significant vascular findings are present. No enlarged abdominal or pelvic lymph nodes. Reproductive: No mass or other significant abnormality. Other: No abdominal wall hernia or abnormality. No abdominopelvic ascites. Musculoskeletal: No acute or significant osseous findings. IMPRESSION: 1. Dense consolidation of the left greater than right bilateral bilateral lung bases, most consistent with infection or aspiration. 2. Moderate burden of stool throughout the colon with large stool ball in the rectum measuring 7.9 cm. Correlate for fecal impaction. 3. There is layering, high attenuation material within the urinary bladder lumen, which may reflect  blood product or excreted contrast. Correlate with urinalysis. 4. Descending and sigmoid diverticulosis without evidence of acute diverticulitis. Electronically Signed   By: Eddie Candle M.D.   On: 10/04/2020 08:58   DG Abd 1 View  Result Date: 10/04/2020 CLINICAL DATA:  70 year old male in need of MRI.  Toe infection. EXAM: ABDOMEN - 1 VIEW COMPARISON:  CT Abdomen and Pelvis 06/25/2019 and earlier. FINDINGS: Portable AP supine view at 0310 hours. Small 4 mm mildly lobulated radiopaque object projects over the left mid abdomen, new from the 2020 CT. This somewhat resembles a dental filling or crown. No other radiopaque foreign body identified. Non obstructed bowel gas pattern. Retained stool in the rectum is new from the prior CT. No acute osseous abnormality identified. IMPRESSION: 1. Solitary, small 4 mm radiopaque object projects over the left mid abdomen, new from the 2020 and somewhat resembles an ingested dental filling or crown. This does not contraindicate MRI. 2. No other radiopaque foreign body identified.  Electronically Signed   By: Genevie Ann M.D.   On: 10/04/2020 04:23   CT HEAD WO CONTRAST  Result Date: 10/04/2020 CLINICAL DATA:  Mental status change. EXAM: CT HEAD WITHOUT CONTRAST TECHNIQUE: Contiguous axial images were obtained from the base of the skull through the vertex without intravenous contrast. COMPARISON:  October 03, 2020. FINDINGS: Brain: No evidence of acute large vascular territory infarction, hemorrhage, hydrocephalus, extra-axial collection or mass lesion/mass effect. Remote bilateral cerebellar infarcts. Vascular: Calcific atherosclerosis. No hyperdense vessel identified. Skull: No acute fracture. Sinuses/Orbits: Visualized sinuses are clear.  Unremarkable orbits. Other: No mastoid effusions. IMPRESSION: 1. No evidence of acute intracranial abnormality. 2. Bilateral remote cerebellar infarcts. Electronically Signed   By: Margaretha Sheffield MD   On: 10/04/2020 08:43   CT Head Wo  Contrast  Result Date: 10/20/2020 CLINICAL DATA:  Delirium. EXAM: CT HEAD WITHOUT CONTRAST TECHNIQUE: Contiguous axial images were obtained from the base of the skull through the vertex without intravenous contrast. COMPARISON:  CT head Dec 24, 2019. FINDINGS: Motion limited study. Brain: No evidence of acute large vascular territory infarction, hemorrhage, hydrocephalus, extra-axial collection or mass lesion/mass effect. Similar remote bilateral cerebellar infarcts. Vascular: Calcific atherosclerosis. No hyperdense vessel identified. Skull: No acute fracture. Sinuses/Orbits: Clear sinuses. Other: No mastoid effusions. IMPRESSION: No evidence of acute intracranial abnormality on this motion limited study. Electronically Signed   By: Margaretha Sheffield MD   On: 10/24/2020 18:18   MR FOOT RIGHT W WO CONTRAST  Result Date: 10/04/2020 CLINICAL DATA:  History of right toe infection and ulceration. Right toe ulceration appears healed, EXAM: MRI OF THE RIGHT FOREFOOT WITHOUT AND WITH CONTRAST TECHNIQUE: Multiplanar, multisequence MR imaging of the right forefoot was performed before and after the administration of intravenous contrast. CONTRAST:  9 mL Gadavist COMPARISON:  None. FINDINGS: Bones/Joint/Cartilage Severe bone marrow edema in the first distal phalanx with cortical destruction of the tuft and avid enhancement. Severe bone marrow edema in the first proximal phalanx with avid enhancement. Overall appearance is consistent with osteomyelitis. No fracture or dislocation. Normal alignment. No joint effusion. Ligaments Collateral ligaments are intact.  Lisfranc ligament is intact. Muscles and Tendons Flexor, peroneal and extensor compartment tendons are intact. Muscle edema of the plantar musculature. Soft tissue No fluid collection or hematoma.  No soft tissue mass. IMPRESSION: 1. Osteomyelitis of the first distal phalanx and first proximal phalanx. No drainable fluid collection to suggest an abscess. 2. Muscle  edema of the plantar musculature concerning for myositis Electronically Signed   By: Kathreen Devoid   On: 10/04/2020 08:32   Portable Chest x-ray  Result Date: 10/04/2020 CLINICAL DATA:  Intubation. EXAM: PORTABLE CHEST 1 VIEW COMPARISON:  Chest x-ray 10/14/2020. FINDINGS: Endotracheal tube tip 4 cm above the carina. NG tube tip in the upper stomach. Side hole at the gastroesophageal junction. Advancement of the NG tube approximately 10 cm suggested. Heart size normal. Low lung volumes with mild bibasilar atelectasis. No pleural effusion or pneumothorax. IMPRESSION: 1. Endotracheal tube tip 4 cm above the carina. 2. NG tube tip in the upper stomach. Side hole at the gastroesophageal junction. Advancement of the NG tube approximately 10 cm suggested. 3. Low lung volumes with mild bibasilar atelectasis. Electronically Signed   By: Marcello Moores  Register   On: 10/04/2020 09:31   DG Chest Port 1 View  Result Date: 10/12/2020 CLINICAL DATA:  Fever.  Hyperglycemia. EXAM: PORTABLE CHEST 1 VIEW COMPARISON:  12/24/2019 FINDINGS: The cardiomediastinal silhouette is within normal limits. EKG leads overlie the chest.  There is mild asymmetric opacity in the left lung base. No pleural effusion or pneumothorax is identified. No acute osseous abnormality is seen. IMPRESSION: Mild left basilar opacity may reflect atelectasis or infection. Electronically Signed   By: Logan Bores M.D.   On: 10/09/2020 15:05   DG Foot Complete Right  Result Date: 10/24/2020 CLINICAL DATA:  Toe infection. EXAM: RIGHT FOOT COMPLETE - 3+ VIEW COMPARISON:  None. FINDINGS: Limited portable study due to osteopenia and positioning. Within this limitation, no gross bony destruction evident to suggest osteomyelitis. Degenerative changes noted at the tibiotalar joint. IMPRESSION: 1. Limited portable study due to osteopenia and positioning. No gross bony destruction evident to suggest osteomyelitis. MRI would be a more sensitive means to evaluate as clinically  warranted. 2. Degenerative changes at the tibiotalar joint. Electronically Signed   By: Misty Stanley M.D.   On: 09/27/2020 15:38   EEG adult  Result Date: 10/04/2020 Lora Havens, MD     10/04/2020 11:14 AM Patient Name: CAROLE DEERE MRN: [id number] Epilepsy Attending: Lora Havens Referring Physician/Provider: Dr Kerney Elbe Date: 10/04/2020 Duration: 25.34 mins Patient history:  70 year old male with a history of Alzheimer's dementia and seizures presenting with AMS in the context of sepsis. Early this AM he exhibited seizure-like activity.  EEG to evaluate for seizures. Level of alertness:  lethargic AEDs during EEG study: Depakote Technical aspects: This EEG study was done with scalp electrodes positioned according to the 10-20 International system of electrode placement. Electrical activity was acquired at a sampling rate of 500Hz  and reviewed with a high frequency filter of 70Hz  and a low frequency filter of 1Hz . EEG data were recorded continuously and digitally stored. Description: EEG showed continuous generalized 3 to 5 Hz theta-delta slowing. Hyperventilation and photic stimulation were not performed.   ABNORMALITY -Continuous slow, generalized IMPRESSION: This study is suggestive of moderate to severe diffuse encephalopathy, nonspecific etiology. No seizures or epileptiform discharges were seen throughout the recording. Priyanka Barbra Sarks   DG FL GUIDED LUMBAR PUNCTURE  Result Date: 10/04/2020 CLINICAL DATA:  Worsening altered mental status. EXAM: DIAGNOSTIC LUMBAR PUNCTURE UNDER FLUOROSCOPIC GUIDANCE COMPARISON:  None. FLUOROSCOPY TIME:  Fluoroscopy Time:  3 minutes and 18 seconds. Radiation Exposure Index (if provided by the fluoroscopic device): 63 mGy Number of Acquired Spot Images: PROCEDURE: Due to patient altered mental status and ventilator dependence, written informed consent was obtained from the patient's brother, Mr. Ishan Sanroman. Emphasized risks included bleeding, infection, and  inability to obtain CSF. A time-out was performed prior to beginning the procedure. With the patient prone, appropriate skin sites over the lower back were identified using fluoroscopic guidance. The lower back was prepped with Betadine. 1% Lidocaine was used for local anesthesia. Initial puncture was performed at L2-3 and L3-4 with a 22 gauge spinal needle although no CSF could be obtained. At this point, I consulted one of our interventional radiology physician's who was then able to assist with the procedure. Patient was placed in a more oblique position and the lower back was re-prepped and redraped using standard sterile technique. Lumbar puncture was then subsequently performed at the L2-3 level using a 20 gauge needle with return of clear CSF. 8 ml of CSF were obtained for laboratory studies. The patient tolerated the procedure well and there were no apparent complications. IMPRESSION: Successful fluoro guided lumbar puncture at the L2-3 level with return of clear CSF. CSF aliquots of been sent to the laboratory for analysis. No evidence for immediate complications.  Electronically Signed   By: Misty Stanley M.D.   On: 10/04/2020 17:36    ROS Blood pressure (!) 83/61, pulse (!) 122, temperature 98 F (36.7 C), temperature source Axillary, resp. rate 19, height 6' (1.829 m), weight 84.5 kg, SpO2 96 %.  Vitals:   10/04/20 1830 10/04/20 1845  BP: 93/63 (!) 83/61  Pulse: (!) 124 (!) 122  Resp: 18 19  Temp:    SpO2: 96% 96%   General Ventilated  Vascular Right foot warm to touch. Unable to palpate pedal pulses.  Neurologic UTA sensation  Dermatologic (Wound) Right hallux with dry soft eschar over the nail bed. Toe slightly edematous, no erythema or ascending cellulitis   Orthopedic: UTA motor. Responds to painful stimuli.    Assessment/Plan:  Right great toe Osteomyelitis -Imaging: Studies independently reviewed -Antibiotics: per ID. -Wound Care: Continue betadine WTD for  now. -Surgical Plan: Plan for right great toe amputation once stable for source control. Reviewed previous vascular studies, these likely will be adequate for healing.   Will continue to follow.  Evelina Bucy 10/04/2020, 7:11 PM   Best available via secure chat for questions or concerns.

## 2020-10-04 NOTE — Progress Notes (Signed)
EEG complete - results pending 

## 2020-10-04 NOTE — Progress Notes (Signed)
RT responded to rapid response for intubation assist.  Pt now has a 7.5 ETT @ 24cm center of lip, secured with ETT tube holder, +ETCO2, color change, BBS noted. Charge therapist bagged pt to CT with 100% FIO2, full tank.

## 2020-10-04 NOTE — Consult Note (Addendum)
NAME:  Tony [name], MRN:  [id number], DOB:  1951/01/10, LOS: 1 ADMISSION DATE:  10/01/2020, CONSULTATION DATE: 3/8 REFERRING MD: Dr. Rosine Door, CHIEF COMPLAINT: Altered mental status  Brief History:  70 year old male with multiple medical problems admitted 3/7 with altered mental status with sepsis and seizure on the differential.  In the early a.m. hours of 3/8 he became unresponsive and PCCM was consulted at which point he was intubated on the medical floor.  History of Present Illness:  70 year old male with past medical history as below, which is significant for Alzheimer's disease, chronic kidney disease stage III, diabetes mellitus, hypertension, and seizure disorder.  Only antiepileptic on home med list is Depakote.  He resides in skilled nursing facility at [facility].  Staff reports he was recently treated for UTI with Keflex in the later part of February.  On 3/7 he presented to [facility] emergency department after developing altered mental status.  Initially he was awake and mumbling but mostly nonverbal.  In the emergency department he was found to be febrile to one 1.7 with oxygen saturations in the low 90s.  He was tachycardic to the 140s.  Laboratory evaluation significant for WBC of 21, serum creatinine of 2.93, and lactic acid of 5.3.  Urine with greater than 500 glucose and positive ketones and large leukocytes.  He was admitted to the internal medicine teaching service with a presumptive diagnosis of sepsis and started on antibiotics.  In the a.m. hours of 3/8 he became less responsive and critical care medicine was consulted.  Past Medical History:   has a past medical history of Alzheimer's disease ([place]), BPH (benign prostatic hyperplasia), CKD (chronic kidney disease), Diabetes mellitus, HTN (hypertension), and Seizure disorder ([place]).   Significant Hospital Events:  3/7 admitted for sepsis/DKA 3/8 unresponsive, intubated, transferred to ICU  Consults:   Neurology  Procedures:  ETT 3/8>  Significant Diagnostic Tests:  CT head 3/7> no evidence of acute intracranial abnormality X-ray right foot 3/7> no gross findings suspicious of osteomyelitis MRI right foot 3/8 >> CT head 3/8 >> CT abdomen pelvis 3/8 >>  Micro Data:  Blood culture 3/7 > Urine culture 3/7 >  tracheal aspirate 3/8 >  Antimicrobials:  Cefepime 3/7 > Vancomycin 3/7 >  Interim History / Subjective:    Objective   Blood pressure 134/71, pulse (!) 113, temperature (!) 102.6 F (39.2 C), temperature source Oral, resp. rate (!) 22, height 6' (1.829 m), weight 84.5 kg, SpO2 97 %.        Intake/Output Summary (Last 24 hours) at 10/04/2020 0804 Last data filed at 10/04/2020 0444 Gross per 24 hour  Intake 4147.78 ml  Output 225 ml  Net 3922.78 ml   Filed Weights   10/23/2020 1451  Weight: 84.5 kg    Examination: General: Elderly, chronically ill-appearing male.  Frail. HENT: Normocephalic, atraumatic, no appreciable JVD, mucous membranes dry Lungs: Clear bilateral breath sounds Cardiovascular: Tachycardic, regular, no murmurs rubs gallops Abdomen: Soft, nontender, nondistended Extremities: No acute deformity, wound to right great toe appears healed Neuro: Unresponsive, moans to painful stimuli, leftward gaze preference  Resolved Hospital Problem list     Assessment & Plan:   Acute encephalopathy: Etiology not entirely clear at this point.  He does have a history of seizures which may explain this considering he did have a left gaze preference on initial evaluation.  Could also be metabolic encephalopathy in the setting of sepsis, however, with lack of obvious source of his sepsis, the syndrome  may simply be related to DKA. -Emergent intubation for airway protection -Stat CT head -Transfer to ICU -Spot EEG -Neurology following  SIRS/possible sepsis: Etiology not abundantly obvious.  Recently treated for UTI and there are large leukocytes in his UA so this  could potentially be the source.  Chest x-ray on admission also demonstrated mild left basilar opacity so we could p.o. be dealing with a community-acquired infection.  Aspiration pneumonia Osteomyelitis of the R foot by MRI 3/8 -Continue empiric vancomycin, transition cefepime to zosyn for better anaerobic coverage (aspiration) -Blood and urine cultures pending -Send tracheal aspirate now that he is intubated -Will consult ID for further recommendations.   Diabetic ketoacidosis: still has a mild gap when accounting for albumin. -Continue infusion for now, will repeat chemistry later today and should be able to transition -DKA protocol  Acute kidney injury: creatinine recovering towards baseline of 1.3 CKD III Hypernatremia: likely due to crystalloid resuscitation for DKA.  Hyperchloremia - Ongoing gentle hydration - Will change D5/LR to d5w - Serial BMP  Seizure history - continue home Depakote  - EEG pending - Neuro following  Alzheimer's dementia - holding donepezil, memantine, paroxetine  Hypertension history - holding home amlodipine while NPO  Best practice (evaluated daily)  Diet: NPO Pain/Anxiety/Delirium protocol (if indicated): PRN sedation for RASS goal 0 to -1 VAP protocol (if indicated): Per protocol DVT prophylaxis: Lovenox GI prophylaxis: PPI Glucose control: Endotool Mobility: Bedrest Disposition:ICU  Goals of Care:  Last date of multidisciplinary goals of care discussion: 3/8 Family and staff present: Tilford Pillar, Ayesha Rumpf and [name] Valda Lamb the patient's brother Summary of discussion: Full scope of care Follow up goals of care discussion due: 3/15 Code Status: FULL  Labs   CBC: Recent Labs  Lab 10/10/2020 1442 10/04/20 0632  WBC 21.1* 13.1*  NEUTROABS 18.3*  --   HGB 14.7 13.6  HCT 46.2 43.5  MCV 98.3 98.2  PLT 313 409    Basic Metabolic Panel: Recent Labs  Lab 10/18/2020 1442 10/14/2020 1937 10/04/20 0202 10/04/20 0632  NA 148* 149* 151*  152*  K 5.0 5.0 3.9 3.8  CL 106 109 114* 115*  CO2 22 25 25 25   GLUCOSE 605* 532* 254* 168*  BUN 52* 47* 43* 37*  CREATININE 2.93* 2.55* 2.07* 1.69*  CALCIUM 10.5* 9.9 10.7* 10.4*  MG  --   --   --  1.7   GFR: Estimated Creatinine Clearance: 44.6 mL/min (A) (by C-G formula based on SCr of 1.69 mg/dL (H)). Recent Labs  Lab 10/02/2020 1442 10/13/2020 1727 10/04/20 0202 10/04/20 0632  WBC 21.1*  --   --  13.1*  LATICACIDVEN 5.3* 5.8* 6.3* 5.4*    Liver Function Tests: Recent Labs  Lab 10/10/2020 1442  AST 23  ALT 12  ALKPHOS 72  BILITOT 1.0  PROT 8.2*  ALBUMIN 3.1*   No results for input(s): LIPASE, AMYLASE in the last 168 hours. No results for input(s): AMMONIA in the last 168 hours.  ABG    Component Value Date/Time   PHART 7.474 (H) 06/19/2019 1555   PCO2ART 43.4 06/19/2019 1555   PO2ART 60.7 (L) 06/19/2019 1555   HCO3 30.7 (H) 06/19/2019 1555   TCO2 18.7 02/07/2010 1730   ACIDBASEDEF 3.7 (H) 02/07/2010 1730   O2SAT 90.5 06/19/2019 1555     Coagulation Profile: No results for input(s): INR, PROTIME in the last 168 hours.  Cardiac Enzymes: Recent Labs  Lab 10/04/20 0632  CKTOTAL 131    HbA1C: Hgb A1c MFr Bld  Date/Time Value Ref Range Status  10/24/2020 07:37 PM 8.8 (H) 4.8 - 5.6 % Final    Comment:    (NOTE) Pre diabetes:          5.7%-6.4%  Diabetes:              >6.4%  Glycemic control for   <7.0% adults with diabetes   12/26/2019 12:23 PM 5.3 4.8 - 5.6 % Final    Comment:    (NOTE) Pre diabetes:          5.7%-6.4% Diabetes:              >6.4% Glycemic control for   <7.0% adults with diabetes     CBG: Recent Labs  Lab 10/04/20 0245 10/04/20 0425 10/04/20 0521 10/04/20 0631 10/04/20 0726  GLUCAP 183* 143* 165* 161* 137*    Review of Systems:   Patient is encephalopathic and/or intubated. Therefore history has been obtained from chart review.   Past Medical History:  He,  has a past medical history of Alzheimer's disease ([place]),  BPH (benign prostatic hyperplasia), CKD (chronic kidney disease), Diabetes mellitus, HTN (hypertension), and Seizure disorder ([place] ).   Surgical History:   Past Surgical History:  Procedure Laterality Date  . NO PAST SURGERIES    . TRANSURETHRAL RESECTION OF PROSTATE N/A 07/10/2019   Procedure: TRANSURETHRAL RESECTION OF THE PROSTATE (TURP);  Surgeon: Lucas Mallow, MD;  Location: [facility];  Service: Urology;  Laterality: N/A;     Social History:   reports that he has never smoked. He has never used smokeless tobacco. He reports that he does not drink alcohol and does not use drugs.   Family History:  His family history includes Diabetes in his mother; Hypertension in his mother.   Allergies Not on File   Home Medications  Prior to Admission medications   Medication Sig Start Date End Date Taking? Authorizing Provider  acetaminophen (TYLENOL) 650 MG CR tablet Take 650 mg by mouth every 8 (eight) hours as needed for pain.    [provider]  Amino Acids-Protein Hydrolys (FEEDING SUPPLEMENT, PRO-STAT SUGAR FREE 64,) LIQD Take 30 mLs by mouth 2 (two) times daily.    [provider]  amLODipine (NORVASC) 5 MG tablet Take 1 tablet (5 mg total) by mouth daily. Patient not taking: Reported on 12/24/2019 04/15/19 07/08/19  Heath Lark D, DO  cabergoline (DOSTINEX) 0.5 MG tablet Take 0.25 mg by mouth once a week.    [provider]  divalproex (DEPAKOTE SPRINKLE) 125 MG capsule Take 500 mg by mouth in the morning, at noon, and at bedtime.    [provider]  donepezil (ARICEPT) 10 MG tablet Take 10 mg by mouth at bedtime.    [provider]  DULoxetine (CYMBALTA) 30 MG capsule Take 30 mg by mouth daily.     [provider]  melatonin 3 MG TABS tablet Take 3 mg by mouth at bedtime.    [provider]  memantine (NAMENDA) 10 MG tablet Take 10 mg by mouth 2 (two) times daily.    [provider]  Menthol, Topical Analgesic,  (BIOFREEZE) 4 % GEL Apply 1 application topically 2 (two) times daily as needed (for back pain).    [provider]  Multiple Vitamin (MULTIVITAMIN WITH MINERALS) TABS tablet Take 1 tablet by mouth daily.    [provider]  mupirocin ointment (BACTROBAN) 2 % Apply 1 application topically 2 (two) times daily. 07/21/20   Criselda Peaches,  DPM  PARoxetine (PAXIL-CR) 12.5 MG 24 hr tablet Take 12.5 mg by mouth daily.    [provider]  tamsulosin (FLOMAX) 0.4 MG CAPS capsule Take 1 capsule (0.4 mg total) by mouth daily after breakfast. 12/16/18   Masoudi, Dorthula Rue, MD  Vitamin D, Ergocalciferol, (DRISDOL) 1.25 MG (50000 UT) CAPS capsule Take 50,000 Units by mouth every Friday.     [provider]     Critical care time: 52 minutes      Georgann Housekeeper, AGACNP-BC [place]  See Amion for personal pager PCCM on call pager [phone] until 7pm. Please call Elink 7p-7a. [phone]  10/04/2020 8:44 AM

## 2020-10-04 NOTE — Progress Notes (Signed)
Pharmacy Antibiotic Note  Tony [name] is a 70 y.o. male admitted on 10/12/2020 with altered mental status found to be in DKA, likely precipitated by infection from possible UTI, PNA, and diabetic foot ulcer of right foot with osteomyelitis confirmed by MRI.   Pt is currently on vancomycin/cefepime. Cefepime has been stopped due to need for anaerobic and pseudomonas coverage of DFU osteomyelitis. Pharmacy has been consulted for zosyn dosing.   Plan: STOP cefepime  STOP metronidazole  START Zosyn 3.375 g Q8H  CONTINUE Vancomycin  Monitor Scr, micro c/s  Height: 6' (182.9 cm) Weight: 84.5 kg (186 lb 4.6 oz) IBW/kg (Calculated) : 77.6  Temp (24hrs), Avg:100.2 F (37.9 C), Min:98.1 F (36.7 C), Max:102.6 F (39.2 C)  Recent Labs  Lab 10/27/2020 1442 10/02/2020 1727 10/13/2020 1937 10/04/20 0202 10/04/20 0632  WBC 21.1*  --   --   --  13.1*  CREATININE 2.93*  --  2.55* 2.07* 1.69*  LATICACIDVEN 5.3* 5.8*  --  6.3* 5.4*    Estimated Creatinine Clearance: 44.6 mL/min (A) (by C-G formula based on SCr of 1.69 mg/dL (H)).    Not on File  Antimicrobials this admission: Vancomycin 3/7 >>c  Zosyn 3/8>>c Metronidazole 3/7 Cefepime 3/7   Microbiology results: 3/7 BCx: pending  3/7 UCx: pending  3/7 Sputum: pending  3/7 MRSA PCR: pending   Thank you for allowing pharmacy to be a part of this patient's care.  Adria Dill, PharmD-Candidate  10/04/2020 9:21 AM

## 2020-10-04 NOTE — Procedures (Addendum)
Patient Name: Tony [name]  MRN: [id number]  Epilepsy Attending: Lora Havens  Referring Physician/Provider: Dr Kerney Elbe Date: 10/04/2020 Duration: 25.34 mins  Patient history:  70 year old male with a history of Alzheimer's dementia and seizures presenting with AMS in the context of sepsis. Early this AM he exhibited seizure-like activity.  EEG to evaluate for seizures.  Level of alertness:  lethargic  AEDs during EEG study: Depakote  Technical aspects: This EEG study was done with scalp electrodes positioned according to the 10-20 International system of electrode placement. Electrical activity was acquired at a sampling rate of 500Hz  and reviewed with a high frequency filter of 70Hz  and a low frequency filter of 1Hz . EEG data were recorded continuously and digitally stored.   Description: EEG showed continuous generalized 3 to 5 Hz theta-delta slowing. Hyperventilation and photic stimulation were not performed.     ABNORMALITY -Continuous slow, generalized  IMPRESSION: This study is suggestive of moderate to severe diffuse encephalopathy, nonspecific etiology. No seizures or epileptiform discharges were seen throughout the recording.  [name] Barbra Sarks

## 2020-10-04 NOTE — Progress Notes (Signed)
Pt's HR in 130's w/o coverage. Liange ([facility]) informed and Waunita Schooner (RR) informed and request to have them come to the unit to assess pt.

## 2020-10-04 NOTE — Progress Notes (Signed)
Patient transported to IR & back on the ventilator with no problems.

## 2020-10-04 NOTE — Progress Notes (Signed)
Patient at stat CT and unavailable for EEG at this time - patient also moving rooms - will try again as soon as patient has new room assignment

## 2020-10-04 NOTE — Progress Notes (Addendum)
Revaluated patient after he returned to floor from MRI. He continues to have altered mental status, does somewhat respond to voice by opening his eyes. Unable to answer any questions on exam. Blood glucose significantly improved to 143. He was however having a new onset of jerking of all four extremities, concerning for myoclonus. RN unaware of how long this has been going on as he just returned to the floor.  Vitals:   10/18/2020 2340 10/04/20 0243  BP: 118/72 123/62  Pulse: (!) 116 (!) 113  Resp: 19 19  Temp: 98.1 F (36.7 C) 98.1 F (36.7 C)  SpO2: 100% 100%   Physical Exam: General- laying in bed, appears uncomfortable Neuro- disoriented, unable to answer any questions, somewhat responsive to voice, seems that he attempted opening his eyes to his name, myoclonus of all 4 extremities (left UE > right, and b/l LE) Skin- dry, left leg is cool to touch, skin otherwise warm Abdomen- nontender to palpation, no guarding, non distended   Assessment and Plan:  Given his history of seizures and onset of myoclonic movement of all four extremities, will consult neurology. He was resumed on his home antiepileptic medications intravenously. Possible he may have been postictal on arrival and having seizures. However, he does respond to his name so this may make it less likely that he is actively seizing. Could also be secondary to sepsis; however, lactic acid has not improved after 3.5 L, continuing to trend upward. DKA has resolved, anion gap closed x2. Will continue IVF boluses given his persistently elevated LA and consult neurology. Also will check a CK.   - Consult neurology, appreciate recommendations - IVF boluses and trend LA - Check CK  - Follow up MRI foot

## 2020-10-04 NOTE — Consult Note (Addendum)
NEURO HOSPITALIST CONSULT NOTE   Requestig physician: Dr. Evette Doffing  Reason for Consult: Possible seizure activity  History obtained from:  Chart     HPI:                                                                                                                                          Tony [name] is an 70 y.o. male with a PMHx of Alzheimer's disease, seizures (diagnosed in childhood with last seizure "years ago"), CKD, DM2 and HTN who presented with AMS yesterday afternoon from [facility]. Per staff at his SNF, the patient was more confused than normal and with malaise for about the last 2 days, but patient was lucid enough to take one of his medications on Monday morning. Additionally, per nursing staff at SNF, patient found Monday morning with elevated blood glucose > 600 and Na in 150s. He was found to be febrile to 101.7 in the ED. HR was in 140s, BP of 111/64.  WBC of 21.1, Na 148, K 5, CO2 22, BUN 52, Cr. 2.93, anion gap of 20, lactic acid of 5.3. He was diagnosed with sepsis. Sats were low at about 90 and he was placed on oxygen. He was admitted to 3W. Initially at about 9 PM the patient was able to correctly follow one simple command, and also was trying to open eyes to command. He answered "yes" when asked about abdominal pain, but otherwise was not able to answer any other questions.   At his baseline, the patient is conversational and recognizes familiar faces, but is disoriented to time and/or place due to his dementia  At about 4:30 AM the patient was noted to be exhibiting seizure-like activity. His RUE was jerking more than left, and BLE jerking was present as well. Neurology was consulted for possible seizure activity.   Home medications include Depakote 500 mg TID (history of seizures). He is also on donepezil and memantine for his dementia.    Past Medical History:  Diagnosis Date  . Alzheimer's disease ([place])   . BPH (benign prostatic  hyperplasia)   . CKD (chronic kidney disease)   . Diabetes mellitus    Type II, diagnosed 2011, not on insulin; admitted in 2011 for hyperglycemia  . HTN (hypertension)   . Seizure disorder ([place])    diagnosed in childhood, last seizure was years ago    Past Surgical History:  Procedure Laterality Date  . NO PAST SURGERIES    . TRANSURETHRAL RESECTION OF PROSTATE N/A 07/10/2019   Procedure: TRANSURETHRAL RESECTION OF THE PROSTATE (TURP);  Surgeon: Lucas Mallow, MD;  Location: [facility];  Service: Urology;  Laterality: N/A;    Family History  Problem Relation Age of Onset  . Diabetes Mother   .  Hypertension Mother               Social History:  reports that he has never smoked. He has never used smokeless tobacco. He reports that he does not drink alcohol and does not use drugs.  Not on File  MEDICATIONS:                                                                                                                     Prior to Admission:  Medications Prior to Admission  Medication Sig Dispense Refill Last Dose  . acetaminophen (TYLENOL) 650 MG CR tablet Take 650 mg by mouth every 8 (eight) hours as needed for pain.     . Amino Acids-Protein Hydrolys (FEEDING SUPPLEMENT, PRO-STAT SUGAR FREE 64,) LIQD Take 30 mLs by mouth 2 (two) times daily.     Marland Kitchen amLODipine (NORVASC) 5 MG tablet Take 1 tablet (5 mg total) by mouth daily. (Patient not taking: Reported on 12/24/2019) 30 tablet 3   . cabergoline (DOSTINEX) 0.5 MG tablet Take 0.25 mg by mouth once a week.     . divalproex (DEPAKOTE SPRINKLE) 125 MG capsule Take 500 mg by mouth in the morning, at noon, and at bedtime.     . donepezil (ARICEPT) 10 MG tablet Take 10 mg by mouth at bedtime.     . DULoxetine (CYMBALTA) 30 MG capsule Take 30 mg by mouth daily.      . melatonin 3 MG TABS tablet Take 3 mg by mouth at bedtime.     . memantine (NAMENDA) 10 MG tablet Take 10 mg by mouth 2 (two) times daily.     . Menthol, Topical Analgesic,  (BIOFREEZE) 4 % GEL Apply 1 application topically 2 (two) times daily as needed (for back pain).     . Multiple Vitamin (MULTIVITAMIN WITH MINERALS) TABS tablet Take 1 tablet by mouth daily.     . mupirocin ointment (BACTROBAN) 2 % Apply 1 application topically 2 (two) times daily. 30 g 2   . PARoxetine (PAXIL-CR) 12.5 MG 24 hr tablet Take 12.5 mg by mouth daily.     . tamsulosin (FLOMAX) 0.4 MG CAPS capsule Take 1 capsule (0.4 mg total) by mouth daily after breakfast. 30 capsule 2   . Vitamin D, Ergocalciferol, (DRISDOL) 1.25 MG (50000 UT) CAPS capsule Take 50,000 Units by mouth every Friday.       Scheduled: . amLODipine  2.5 mg Oral Daily  . enoxaparin (LOVENOX) injection  30 mg Subcutaneous Q24H  . multivitamin with minerals  1 tablet Oral Daily  . mupirocin ointment  1 application Topical BID  . tamsulosin  0.4 mg Oral QPC breakfast  . vancomycin variable dose per unstable renal function (pharmacist dosing)   Does not apply See admin instructions   Continuous: . ceFEPime (MAXIPIME) IV    . dextrose 5% lactated ringers 125 mL/hr at 10/04/20 0223  . insulin 2.2 Units/hr (10/04/20 0432)  . lactated ringers 125 mL/hr at 10/04/20 0221  . valproate  sodium 500 mg (10/04/20 0003)     ROS:                                                                                                                                       Unable to obtain due to AMS.   Blood pressure 123/62, pulse (!) 113, temperature 98.1 F (36.7 C), resp. rate 19, height 6' (1.829 m), weight 84.5 kg, SpO2 100 %.   General Examination:                                                                                                       Physical Exam  HEENT-  Normocephalic. He has torticollis with head rotated to the left, tightness of right SCM to palpation and pain with head rotation to the right.  Lungs- Respirations unlabored Extremities- No cyanosis or pallor  Neurological Examination Mental Status: Awake but  with altered sensorium in an obtunded and agitated state. Will briefly open eyes to noxious stimuli but will not fixate or track. Verbal output is minimal, consisting of groans to noxious stimuli and incomprehensible, hypophonic, single word responses to some questions. Not following any commands.  Cranial Nerves: II: PERRL 3 mm >> 2 mm. Blinks to threat consistently on the left but not on the right.   III,IV, VI: Eyes are deviated about 4 mm to the left of midline conjugately. No nystagmus.  V,VII: Grimace is symmetric to brow ridge pressure bilaterally.  VIII: Unable to assess IX,X: Hypophonic speech XI: Head rotated to the left (see above) XII: Not following command for tongue extension Motor: There is continuous waxing/waning jerking of RUE which waxes and wanes in amplitude but not frequency. Frequency is about 4-6 Hz. The quality is suggestive of shivering versus epileptic activity. There is asynchronous, intermittent lower amplitude jerking of LUE. At times, some low-amplitude tremor-like movements of BLE are also noted. The movements are worse with stimulation. 2 mg IV Ativan resulted in resolution of the movements and sedation of the patient. He does not move to command, but will move RUE semipurposefully to noxious stimulation > LUE. Weak withdrawal of BLE to noxious. Increased tone in all 4 extremities.  Sensory: Reacts to noxious stimulation in all 4 extremities.  Deep Tendon Reflexes: Unable to elicit consistent reflexes in the context of tremoring and increased tone.  Plantars: Equivocal bilaterally Cerebellar/Gait: Unable to assess   Lab Results: Basic Metabolic Panel: Recent Labs  Lab 10/02/2020 1442 10/19/2020 1937 10/04/20 0202  NA  148* 149* 151*  K 5.0 5.0 3.9  CL 106 109 114*  CO2 22 25 25   GLUCOSE 605* 532* 254*  BUN 52* 47* 43*  CREATININE 2.93* 2.55* 2.07*  CALCIUM 10.5* 9.9 10.7*    CBC: Recent Labs  Lab 10/05/2020 1442  WBC 21.1*  NEUTROABS 18.3*  HGB 14.7   HCT 46.2  MCV 98.3  PLT 313    Cardiac Enzymes: No results for input(s): CKTOTAL, CKMB, CKMBINDEX, TROPONINI in the last 168 hours.  Lipid Panel: No results for input(s): CHOL, TRIG, HDL, CHOLHDL, VLDL, LDLCALC in the last 168 hours.  Imaging: DG Abd 1 View  Result Date: 10/04/2020 CLINICAL DATA:  70 year old male in need of MRI.  Toe infection. EXAM: ABDOMEN - 1 VIEW COMPARISON:  CT Abdomen and Pelvis 06/25/2019 and earlier. FINDINGS: Portable AP supine view at 0310 hours. Small 4 mm mildly lobulated radiopaque object projects over the left mid abdomen, new from the 2020 CT. This somewhat resembles a dental filling or crown. No other radiopaque foreign body identified. Non obstructed bowel gas pattern. Retained stool in the rectum is new from the prior CT. No acute osseous abnormality identified. IMPRESSION: 1. Solitary, small 4 mm radiopaque object projects over the left mid abdomen, new from the 2020 and somewhat resembles an ingested dental filling or crown. This does not contraindicate MRI. 2. No other radiopaque foreign body identified. Electronically Signed   By: Genevie Ann M.D.   On: 10/04/2020 04:23   CT Head Wo Contrast  Result Date: 10/18/2020 CLINICAL DATA:  Delirium. EXAM: CT HEAD WITHOUT CONTRAST TECHNIQUE: Contiguous axial images were obtained from the base of the skull through the vertex without intravenous contrast. COMPARISON:  CT head Dec 24, 2019. FINDINGS: Motion limited study. Brain: No evidence of acute large vascular territory infarction, hemorrhage, hydrocephalus, extra-axial collection or mass lesion/mass effect. Similar remote bilateral cerebellar infarcts. Vascular: Calcific atherosclerosis. No hyperdense vessel identified. Skull: No acute fracture. Sinuses/Orbits: Clear sinuses. Other: No mastoid effusions. IMPRESSION: No evidence of acute intracranial abnormality on this motion limited study. Electronically Signed   By: Margaretha Sheffield MD   On: 09/29/2020 18:18   DG  Chest Port 1 View  Result Date: 10/18/2020 CLINICAL DATA:  Fever.  Hyperglycemia. EXAM: PORTABLE CHEST 1 VIEW COMPARISON:  12/24/2019 FINDINGS: The cardiomediastinal silhouette is within normal limits. EKG leads overlie the chest. There is mild asymmetric opacity in the left lung base. No pleural effusion or pneumothorax is identified. No acute osseous abnormality is seen. IMPRESSION: Mild left basilar opacity may reflect atelectasis or infection. Electronically Signed   By: Logan Bores M.D.   On: 10/13/2020 15:05   DG Foot Complete Right  Result Date: 10/17/2020 CLINICAL DATA:  Toe infection. EXAM: RIGHT FOOT COMPLETE - 3+ VIEW COMPARISON:  None. FINDINGS: Limited portable study due to osteopenia and positioning. Within this limitation, no gross bony destruction evident to suggest osteomyelitis. Degenerative changes noted at the tibiotalar joint. IMPRESSION: 1. Limited portable study due to osteopenia and positioning. No gross bony destruction evident to suggest osteomyelitis. MRI would be a more sensitive means to evaluate as clinically warranted. 2. Degenerative changes at the tibiotalar joint. Electronically Signed   By: Misty Stanley M.D.   On: 10/19/2020 15:38    Assessment: 70 year old male with a history of Alzheimer's dementia and seizures presenting with AMS in the context of sepsis. Early this AM he exhibited seizure-like activity versus shivering in the context of rapid IV infusion and cool room temperature.  1. Exam findings reveal seizure-like movements that are somewhat atypical, but the asymmetry would increase the likelihood of epileptic phenomenon and he also has a known longstanding history of seizures. STAT EEG has been ordered.  2. Exam findings also are consistent with a diffuse encephalopathy, which may be multifactorial in the setting of underlying dementia as well as sepsis, but seizure activity +/- postictal state is also on the DDx.  3. Blinks to threat consistently on the left  but not on the right. Findings concerning for possible left occipital lobe lesion.  4. He has torticollis with head rotated to the left, tightness of right SCM to palpation and pain with head rotation to the right.  5. Jerking movements of limbs resolved with 2 mg IV Ativan but there are residual coarse tremulous movements that can be elicited in the RUE with passive flexion-extension of the limb by examiner. Torticollis improved with Ativan as well.  6. CT head with no evidence of acute intracranial abnormality on motion limited study.  Recommendations: 1. Continue home Depakote 2. STAT EEG has been ordered 3. Warm blankets versus Bair hugger recommended. Also recommend warming of IVF. 4. MRI brain   Electronically signed: Dr. Kerney Elbe 10/04/2020, 4:48 AM

## 2020-10-04 NOTE — Progress Notes (Signed)
PCCM  NOTE  After obtaining consent and performing time out, lumbar puncture was attempted under sterile procedure protocols. Unable to obtain CSF despite three separate attempts. No blood loss. No complications.   Dr Tacy Learn present and assisted with procedure.   Will ask for IR to evaluate under fluoro    Georgann Housekeeper, AGACNP-BC [place] for personal pager PCCM on call pager [phone] until 7pm. Please call Elink 7p-7a. [phone]  10/04/2020 1:43 PM

## 2020-10-04 NOTE — Progress Notes (Signed)
Pt is in process of being intubated and being sent to stat CT. Pt is going to be transferring to 3M14. Report has been given and received.

## 2020-10-05 ENCOUNTER — Telehealth: Payer: Self-pay | Admitting: Podiatry

## 2020-10-05 ENCOUNTER — Inpatient Hospital Stay (HOSPITAL_COMMUNITY): Payer: Medicare (Managed Care)

## 2020-10-05 DIAGNOSIS — J9601 Acute respiratory failure with hypoxia: Secondary | ICD-10-CM | POA: Diagnosis not present

## 2020-10-05 DIAGNOSIS — R6520 Severe sepsis without septic shock: Secondary | ICD-10-CM | POA: Diagnosis not present

## 2020-10-05 DIAGNOSIS — N179 Acute kidney failure, unspecified: Secondary | ICD-10-CM | POA: Diagnosis not present

## 2020-10-05 DIAGNOSIS — A419 Sepsis, unspecified organism: Secondary | ICD-10-CM | POA: Diagnosis not present

## 2020-10-05 DIAGNOSIS — J690 Pneumonitis due to inhalation of food and vomit: Secondary | ICD-10-CM

## 2020-10-05 DIAGNOSIS — G309 Alzheimer's disease, unspecified: Secondary | ICD-10-CM | POA: Diagnosis not present

## 2020-10-05 DIAGNOSIS — L97519 Non-pressure chronic ulcer of other part of right foot with unspecified severity: Secondary | ICD-10-CM | POA: Diagnosis not present

## 2020-10-05 DIAGNOSIS — K559 Vascular disorder of intestine, unspecified: Secondary | ICD-10-CM

## 2020-10-05 DIAGNOSIS — R401 Stupor: Secondary | ICD-10-CM

## 2020-10-05 DIAGNOSIS — R4189 Other symptoms and signs involving cognitive functions and awareness: Secondary | ICD-10-CM

## 2020-10-05 DIAGNOSIS — R4182 Altered mental status, unspecified: Secondary | ICD-10-CM | POA: Diagnosis not present

## 2020-10-05 DIAGNOSIS — M86171 Other acute osteomyelitis, right ankle and foot: Secondary | ICD-10-CM | POA: Diagnosis not present

## 2020-10-05 LAB — URINE CULTURE

## 2020-10-05 LAB — BASIC METABOLIC PANEL
Anion gap: 10 (ref 5–15)
BUN: 29 mg/dL — ABNORMAL HIGH (ref 8–23)
CO2: 24 mmol/L (ref 22–32)
Calcium: 9.9 mg/dL (ref 8.9–10.3)
Chloride: 112 mmol/L — ABNORMAL HIGH (ref 98–111)
Creatinine, Ser: 1.27 mg/dL — ABNORMAL HIGH (ref 0.61–1.24)
GFR, Estimated: >60 mL/min (ref >60)
Glucose, Bld: 252 mg/dL — ABNORMAL HIGH (ref 70–99)
Potassium: 3.6 mmol/L (ref 3.5–5.1)
Sodium: 146 mmol/L — ABNORMAL HIGH (ref 135–145)

## 2020-10-05 LAB — CBC
HCT: 33.2 % — ABNORMAL LOW (ref 39.0–52.0)
Hemoglobin: 10.5 g/dL — ABNORMAL LOW (ref 13.0–17.0)
MCH: 31.1 pg (ref 26.0–34.0)
MCHC: 31.6 g/dL (ref 30.0–36.0)
MCV: 98.2 fL (ref 80.0–100.0)
Platelets: 151 10*3/uL (ref 150–400)
RBC: 3.38 MIL/uL — ABNORMAL LOW (ref 4.22–5.81)
RDW: 12.6 % (ref 11.5–15.5)
WBC: 16 10*3/uL — ABNORMAL HIGH (ref 4.0–10.5)
nRBC: 0 % (ref 0.0–0.2)

## 2020-10-05 LAB — PHOSPHORUS: Phosphorus: 2.2 mg/dL — ABNORMAL LOW (ref 2.5–4.6)

## 2020-10-05 LAB — MAGNESIUM: Magnesium: 2 mg/dL (ref 1.7–2.4)

## 2020-10-05 LAB — GLUCOSE, CAPILLARY
Glucose-Capillary: 218 mg/dL — ABNORMAL HIGH (ref 70–99)
Glucose-Capillary: 237 mg/dL — ABNORMAL HIGH (ref 70–99)
Glucose-Capillary: 240 mg/dL — ABNORMAL HIGH (ref 70–99)
Glucose-Capillary: 243 mg/dL — ABNORMAL HIGH (ref 70–99)
Glucose-Capillary: 251 mg/dL — ABNORMAL HIGH (ref 70–99)
Glucose-Capillary: 257 mg/dL — ABNORMAL HIGH (ref 70–99)

## 2020-10-05 LAB — LACTIC ACID, PLASMA: Lactic Acid, Venous: 5.7 mmol/L (ref 0.5–1.9)

## 2020-10-05 MED ORDER — INSULIN DETEMIR 100 UNIT/ML ~~LOC~~ SOLN
20.0000 [IU] | Freq: Two times a day (BID) | SUBCUTANEOUS | Status: DC
  Administered 2020-10-05 (×2): 20 [IU] via SUBCUTANEOUS
  Filled 2020-10-05 (×5): qty 0.2

## 2020-10-05 MED ORDER — PANTOPRAZOLE SODIUM 40 MG PO PACK
40.0000 mg | PACK | ORAL | Status: DC
  Administered 2020-10-05 – 2020-10-15 (×11): 40 mg
  Filled 2020-10-05 (×11): qty 20

## 2020-10-05 MED ORDER — INSULIN ASPART 100 UNIT/ML ~~LOC~~ SOLN
4.0000 [IU] | SUBCUTANEOUS | Status: DC
  Administered 2020-10-05 – 2020-10-07 (×8): 4 [IU] via SUBCUTANEOUS

## 2020-10-05 MED ORDER — ENOXAPARIN SODIUM 40 MG/0.4ML ~~LOC~~ SOLN
40.0000 mg | SUBCUTANEOUS | Status: DC
  Administered 2020-10-05 – 2020-10-17 (×12): 40 mg via SUBCUTANEOUS
  Filled 2020-10-05 (×13): qty 0.4

## 2020-10-05 MED ORDER — MAGNESIUM SULFATE 2 GM/50ML IV SOLN
2.0000 g | Freq: Once | INTRAVENOUS | Status: AC
  Administered 2020-10-05: 2 g via INTRAVENOUS
  Filled 2020-10-05: qty 50

## 2020-10-05 MED ORDER — THIAMINE HCL 100 MG PO TABS
100.0000 mg | ORAL_TABLET | Freq: Every day | ORAL | Status: DC
  Administered 2020-10-05 – 2020-10-15 (×11): 100 mg
  Filled 2020-10-05 (×11): qty 1

## 2020-10-05 MED ORDER — VALPROIC ACID 250 MG/5ML PO SOLN
500.0000 mg | Freq: Three times a day (TID) | ORAL | Status: DC
  Administered 2020-10-05 – 2020-10-15 (×31): 500 mg
  Filled 2020-10-05 (×34): qty 10

## 2020-10-05 MED ORDER — ALBUMIN HUMAN 25 % IV SOLN
25.0000 g | Freq: Once | INTRAVENOUS | Status: AC
  Administered 2020-10-05: 25 g via INTRAVENOUS
  Filled 2020-10-05: qty 100

## 2020-10-05 MED ORDER — FENTANYL 2500MCG IN NS 250ML (10MCG/ML) PREMIX INFUSION
0.0000 ug/h | INTRAVENOUS | Status: DC
  Administered 2020-10-05: 25 ug/h via INTRAVENOUS
  Administered 2020-10-06 – 2020-10-07 (×2): 150 ug/h via INTRAVENOUS
  Administered 2020-10-08: 100 ug/h via INTRAVENOUS
  Filled 2020-10-05 (×4): qty 250

## 2020-10-05 NOTE — Telephone Encounter (Signed)
Patient was seen. 

## 2020-10-05 NOTE — Consult Note (Signed)
NAME:  Tony [name], MRN:  [id number], DOB:  06/25/1951, LOS: 2 ADMISSION DATE:  10/04/2020, CONSULTATION DATE: 3/8 REFERRING MD: Dr. Rosine Door, CHIEF COMPLAINT: Altered mental status  Brief History:  70 yo male admitted from [facility] on 3/07 with AMS, fever (Tm 101.7), tachycardia, lactic acidosis and hyperglycemia.  Required intubation for airway protection and transferred to ICU on 3/08.  Past Medical History:  Alzheimer's dementia, BPH, CKD 3a, DM type 2, HTN, Seizure disorder, CVA  Significant Hospital Events:  3/7 admitted for sepsis/DKA 3/8 unresponsive, intubated, transferred to ICU  Consults:  Neurology  Procedures:  ETT 3/8 >  Significant Diagnostic Tests:   CT head 3/7 > no evidence of acute intracranial abnormality  X-ray right foot 3/7 > no gross findings suspicious of osteomyelitis  MRI right foot 3/8 > osteomyelitis of 1st distal phalanx and 1st proximal phalanx, plantar muscle edema concerning for myositis  CT head 3/8 > remote b/l cerebellar infarcts  CT abdomen pelvis 3/8 > bibasilar consolidation, heavy stool burden throughout colon, diverticulosis of descending and sigmoid colon  LP 3/8 > glucose 139, protein 113, RBC 48, WBC 6  EEG 3/8 > generalized slowing  Micro Data:  COVID/Flu 3/7 > negative MRSA PCR 3/7 > negative Blood culture 3/7 > Urine culture 3/7 > multiple species tracheal aspirate 3/8 > CSF 3/8 >  Antimicrobials:  Cefepime 3/7 > 3/8 Vancomycin 3/7 > 3/8 Zosyn 3/8 > Daptomycin 3/8 >  Interim History / Subjective:  Remains on vent.  Objective   Blood pressure 138/72, pulse (!) 129, temperature 100.22 F (37.9 C), resp. rate 18, height 6' (1.829 m), weight 85.7 kg, SpO2 100 %.    Vent Mode: PRVC FiO2 (%):  [40 %-100 %] 40 % Set Rate:  [12 bmp-16 bmp] 12 bmp Vt Set:  [550 mL-620 mL] 550 mL PEEP:  [5 cmH20] 5 cmH20 Plateau Pressure:  [8 cmH20-15 cmH20] 13 cmH20   Intake/Output Summary (Last 24 hours) at 10/05/2020  0735 Last data filed at 10/05/2020 0630 Gross per 24 hour  Intake 7572.48 ml  Output 1250 ml  Net 6322.48 ml   Filed Weights   10/18/2020 1451 10/05/20 0500  Weight: 84.5 kg 85.7 kg    Examination:  General - somnolent Eyes - pupils reactive ENT - ETT in place Cardiac - regular, tachycardic, no murmur Chest - b/l rhonchi Abdomen - soft, non tender, + bowel sounds Extremities - 1+ edema Skin - onychomycosis of both feet Neuro - opens eyes with stimulation, not following commands   Resolved Hospital Problem list   Hypernatremia  Assessment & Plan:   Acute hypoxic respiratory failure with compromised airway in setting of aspiration pneumonitis. - full vent support - f/u CXR - goal SpO2 > 92%  Sepsis from aspiration pneumonia and chronic osteomyelitis of Rt foot. - day 3 of ABx, currently on daptomycin and zosyn per ID - podiatry consulted >> likely will need amputation  Acute metabolic encephalopathy from sepsis, hyperglycemia. Hx of seizures, Alzheimer dementia, CVA. - RASS goal 0 to -1 - continue depakote - hold outpt aricept, paxil, namenda  DKA. DM type 2 poorly controlled with hyperglycemia. - SSI with tube feed coverage and levemir  AKI from ATN in setting of sepsis. CKD 3a. Lactic acidosis. Hx of BPH. - baseline creatinine 1.3 - continue IV fluids - f/u BMET - monitor urine outpt - hold outpt flomax  Anemia of critical illness and chronic disease. - f/u CBC - transfuse for Hb <  7 or significant bleeding  Hx of HTN. - hold outpt norvasc  Best practice (evaluated daily)  Diet: tube feeds DVT prophylaxis: Lovenox GI prophylaxis: PPI Glucose control: Endotool Mobility: Bedrest Disposition:ICU Code Status: FULL  Labs    CMP Latest Ref Rng & Units 10/05/2020 10/04/2020 10/04/2020  Glucose 70 - 99 mg/dL 252(H) - 191(H)  BUN 8 - 23 mg/dL 29(H) - 33(H)  Creatinine 0.61 - 1.24 mg/dL 1.27(H) - 1.61(H)  Sodium 135 - 145 mmol/L 146(H) - 151(H)  Potassium  3.5 - 5.1 mmol/L 3.6 - 3.7  Chloride 98 - 111 mmol/L 112(H) - 113(H)  CO2 22 - 32 mmol/L 24 - 24  Calcium 8.9 - 10.3 mg/dL 9.9 - 10.1  Total Protein 6.5 - 8.1 g/dL - 6.5 -  Total Bilirubin 0.3 - 1.2 mg/dL - 0.8 -  Alkaline Phos 38 - 126 U/L - 51 -  AST 15 - 41 U/L - 21 -  ALT 0 - 44 U/L - 9 -    CBC Latest Ref Rng & Units 10/05/2020 10/04/2020 10/04/2020  WBC 4.0 - 10.5 K/uL 16.0(H) 13.4(H) -  Hemoglobin 13.0 - 17.0 g/dL 10.5(L) 12.1(L) 11.9(L)  Hematocrit 39.0 - 52.0 % 33.2(L) 39.5 35.0(L)  Platelets 150 - 400 K/uL 151 208 -    ABG    Component Value Date/Time   PHART 7.529 (H) 10/04/2020 0923   PCO2ART 32.1 10/04/2020 0923   PO2ART 174 (H) 10/04/2020 0923   HCO3 26.4 10/04/2020 0923   TCO2 27 10/04/2020 0923   ACIDBASEDEF 3.7 (H) 02/07/2010 1730   O2SAT 100.0 10/04/2020 0923    CBG (last 3)  Recent Labs    10/04/20 2002 10/04/20 2316 10/05/20 0406  GLUCAP 209* 242* 218*    Critical care time: 33 minutes  Chesley Mires, MD [place] Pager - [phone] 10/05/2020, 7:53 AM

## 2020-10-05 NOTE — Progress Notes (Signed)
[facility]  Date of Admission:  10/11/2020     Total days of antibiotics 3         ASSESSMENT:  Mr. Shin blood and respiratory cultures remain without growth. CSF unremarkable. Appears to have small amount of clinical improvement today and attempts to open eyes to name and does not follow commands. Does have a tremor of his right hand and unclear the significance of this. Podiatry with plans for great toe amputation once stable. Given the concern for aspiration pneumonia will continue with broad spectrum coverage with daptomycin and pip/tazo. Continue to monitor cultures for bacteremia and CK levels while on daptomycin.   PLAN:  1. Continue current Daptomycin and Pip/tazo.  2. Monitor cultures for organism identification. 3. Therapeutic drug monitoring of CK levels while on Daptomycin.  4. Respiratory management per primary team.   Principal Problem:   Altered mental status Active Problems:   Hypertension   Dementia (HCC)   Acute renal failure superimposed on stage 3b chronic kidney disease (HCC)   Sepsis ([place])   DKA (diabetic ketoacidosis) (HCC)   Ulcer of great toe, right, with unspecified severity ([place])   Unresponsive   Endotracheal tube present   Acute osteomyelitis of toe, right ([place])   . chlorhexidine gluconate (MEDLINE KIT)  15 mL Mouth Rinse BID  . Chlorhexidine Gluconate Cloth  6 each Topical Daily  . docusate  100 mg Per Tube BID  . enoxaparin (LOVENOX) injection  30 mg Subcutaneous Q24H  . feeding supplement (PROSource TF)  45 mL Per Tube QID  . insulin aspart  0-15 Units Subcutaneous Q4H  . insulin aspart  4 Units Subcutaneous Q4H  . insulin detemir  20 Units Subcutaneous BID  . mouth rinse  15 mL Mouth Rinse 10 times per day  . multivitamin with minerals  1 tablet Per Tube Daily  . mupirocin ointment  1 application Topical BID  . pantoprazole sodium  40 mg Per Tube Q24H  . polyethylene glycol  17 g Per Tube Daily  . thiamine   100 mg Per Tube Daily  . valproic acid  500 mg Per Tube Q8H    SUBJECTIVE:  Remains on ventilator. Attempts to open eyes to name. Does not follow commands.   No Known Allergies   Review of Systems: Review of Systems  Unable to perform ROS: Intubated      OBJECTIVE: Vitals:   10/05/20 0700 10/05/20 0800 10/05/20 0812 10/05/20 0900  BP: (!) 97/50 (!) 99/55  (!) 108/54  Pulse: (!) 121 (!) 116  (!) 120  Resp: '12 12  12  ' Temp: (!) 100.4 F (38 C) 99.86 F (37.7 C)  99.86 F (37.7 C)  TempSrc:  Esophageal    SpO2: 100% 100% 100% 100%  Weight:      Height:       Body mass index is 25.62 kg/m.  Physical Exam Constitutional:      Appearance: He is ill-appearing.     Interventions: He is intubated and restrained.     Comments: Lying in bed with head of bed elevated.   Cardiovascular:     Rate and Rhythm: Tachycardia present.  Pulmonary:     Effort: Pulmonary effort is normal. No respiratory distress. He is intubated.     Breath sounds: Normal breath sounds.  Abdominal:     General: Bowel sounds are normal.  Neurological:     Comments: Right arm tremor/twitch     Lab Results Lab Results  Component Value Date   WBC 16.0 (H) 10/05/2020   HGB 10.5 (L) 10/05/2020   HCT 33.2 (L) 10/05/2020   MCV 98.2 10/05/2020   PLT 151 10/05/2020    Lab Results  Component Value Date   CREATININE 1.27 (H) 10/05/2020   BUN 29 (H) 10/05/2020   NA 146 (H) 10/05/2020   K 3.6 10/05/2020   CL 112 (H) 10/05/2020   CO2 24 10/05/2020    Lab Results  Component Value Date   ALT 9 10/04/2020   AST 21 10/04/2020   ALKPHOS 51 10/04/2020   BILITOT 0.8 10/04/2020     Microbiology: Recent Results (from the past 240 hour(s))  Urine culture     Status: Abnormal   Collection Time: 09/27/2020  2:42 PM   Specimen: Urine, Random  Result Value Ref Range Status   Specimen Description URINE, RANDOM  Final   Special Requests   Final    NONE Performed at [facility], [address], [place], [place] [address]    Culture MULTIPLE SPECIES PRESENT, SUGGEST RECOLLECTION (A)  Final   Report Status 10/05/2020 FINAL  Final  Resp Panel by RT-PCR (Flu A&B, Covid) Nasopharyngeal Swab     Status: None   Collection Time: 10/05/2020  2:55 PM   Specimen: Nasopharyngeal Swab; Nasopharyngeal(NP) swabs in vial transport medium  Result Value Ref Range Status   SARS Coronavirus 2 by RT PCR NEGATIVE NEGATIVE Final    Comment: (NOTE) SARS-CoV-2 target nucleic acids are NOT DETECTED.  The SARS-CoV-2 RNA is generally detectable in upper respiratory specimens during the acute phase of infection. The lowest concentration of SARS-CoV-2 viral copies this assay can detect is 138 copies/mL. A negative result does not preclude SARS-Cov-2 infection and should not be used as the sole basis for treatment or other patient management decisions. A negative result may occur with  improper specimen collection/handling, submission of specimen other than nasopharyngeal swab, presence of viral mutation(s) within the areas targeted by this assay, and inadequate number of viral copies(<138 copies/mL). A negative result must be combined with clinical observations, patient history, and epidemiological information. The expected result is Negative.  Fact Sheet for Patients:  EntrepreneurPulse.com.au  Fact Sheet for Healthcare Providers:  IncredibleEmployment.be  This test is no t yet approved or cleared by the [place] FDA and  has been authorized for detection and/or diagnosis of SARS-CoV-2 by FDA under an Emergency Use Authorization (EUA). This EUA will remain  in effect (meaning this test can be used) for the duration of the COVID-19 declaration under Section 564(b)(1) of the Act, 21 U.S.C.section 360bbb-3(b)(1), unless the authorization is terminated  or revoked sooner.       Influenza A by PCR NEGATIVE NEGATIVE Final   Influenza B by PCR NEGATIVE  NEGATIVE Final    Comment: (NOTE) The Xpert Xpress SARS-CoV-2/FLU/RSV plus assay is intended as an aid in the diagnosis of influenza from Nasopharyngeal swab specimens and should not be used as a sole basis for treatment. Nasal washings and aspirates are unacceptable for Xpert Xpress SARS-CoV-2/FLU/RSV testing.  Fact Sheet for Patients: EntrepreneurPulse.com.au  Fact Sheet for Healthcare Providers: IncredibleEmployment.be  This test is not yet approved or cleared by the [place] FDA and has been authorized for detection and/or diagnosis of SARS-CoV-2 by FDA under an Emergency Use Authorization (EUA). This EUA will remain in effect (meaning this test can be used) for the duration of the COVID-19 declaration under Section 564(b)(1) of the Act, 21 U.S.C. section 360bbb-3(b)(1),  unless the authorization is terminated or revoked.  Performed at [facility], [place] [address], [place], [place] [address]   Blood culture (routine x 2)     Status: None (Preliminary result)   Collection Time: 10/01/2020  6:59 PM   Specimen: BLOOD  Result Value Ref Range Status   Specimen Description BLOOD SITE NOT SPECIFIED  Final   Special Requests   Final    BOTTLES DRAWN AEROBIC AND ANAEROBIC Blood Culture results may not be optimal due to an inadequate volume of blood received in culture bottles   Culture   Final    NO GROWTH < 24 HOURS Performed at [facility], [place] [address], [place], [place] [address]    Report Status PENDING  Incomplete  Blood culture (routine x 2)     Status: None (Preliminary result)   Collection Time: 10/09/2020  7:00 PM   Specimen: BLOOD  Result Value Ref Range Status   Specimen Description BLOOD LEFT ANTECUBITAL  Final   Special Requests   Final    BOTTLES DRAWN AEROBIC AND ANAEROBIC Blood Culture adequate volume   Culture   Final    NO GROWTH < 24 HOURS Performed at [facility], [place] [address], [place], [place]  [address]    Report Status PENDING  Incomplete  Culture, Respiratory w Gram Stain     Status: None (Preliminary result)   Collection Time: 10/04/20  8:24 AM   Specimen: Tracheal Aspirate; Respiratory  Result Value Ref Range Status   Specimen Description TRACHEAL ASPIRATE  Final   Special Requests NONE  Final   Gram Stain   Final    MODERATE WBC PRESENT,BOTH PMN AND MONONUCLEAR RARE SQUAMOUS EPITHELIAL CELLS PRESENT FEW GRAM POSITIVE RODS RARE BUDDING YEAST SEEN    Culture   Final    CULTURE REINCUBATED FOR BETTER GROWTH Performed at [facility], [place] [address], [place], [place] [address]    Report Status PENDING  Incomplete  MRSA PCR Screening     Status: None   Collection Time: 10/04/20  8:56 AM   Specimen: Nasopharyngeal  Result Value Ref Range Status   MRSA by PCR NEGATIVE NEGATIVE Final    Comment:        The GeneXpert MRSA Assay (FDA approved for NASAL specimens only), is one component of a comprehensive MRSA colonization surveillance program. It is not intended to diagnose MRSA infection nor to guide or monitor treatment for MRSA infections. Performed at [facility], [place] [address] [place], [place] [address]   CSF culture w Gram Stain     Status: None (Preliminary result)   Collection Time: 10/04/20  5:01 PM   Specimen: PATH Cytology CSF; Cerebrospinal Fluid  Result Value Ref Range Status   Specimen Description CSF  Final   Special Requests NONE  Final   Gram Stain   Final    WBC PRESENT, PREDOMINANTLY MONONUCLEAR NO ORGANISMS SEEN CYTOSPIN SMEAR    Culture   Final    NO GROWTH < 24 HOURS Performed at [facility], [place] [address] [place],  [address]    Report Status PENDING  Incomplete     Terri Piedra, [place] [facility]  10/05/2020  10:12 AM

## 2020-10-05 NOTE — Progress Notes (Signed)
Subjective: No acute events overnight.  ROS: Unable to obtain due to poor mental status  Examination  Vital signs in last 24 hours: Temp:  [98 F (36.7 C)-100.4 F (38 C)] 100.22 F (37.9 C) (03/09 1213) Pulse Rate:  [107-151] 127 (03/09 1213) Resp:  [6-23] 22 (03/09 1213) BP: (78-161)/(50-107) 161/66 (03/09 1200) SpO2:  [92 %-100 %] 100 % (03/09 1213) FiO2 (%):  [40 %-50 %] 40 % (03/09 1213) Weight:  [85.7 kg] 85.7 kg (03/09 0500)  General: lying in bed, not in apparent distress CVS: pulse-normal rate and rhythm RS: breathing comfortably, intubated Extremities: normal  Neuro: opens eyes to repeated tactile stimuli, does not follow commands, PERRLA, corneal reflex intact, gag is intact, withdraws to noxious in all 4 extremities, tremor-like movements on stimulation  Basic Metabolic Panel: Recent Labs  Lab 10/04/20 0632 10/04/20 0923 10/04/20 0927 10/04/20 1353 10/04/20 1517 10/04/20 1906 10/05/20 0450  NA 152* 154* 151* 152* 151*  --  146*  K 3.8 3.4* 3.9 3.8 3.7  --  3.6  CL 115*  --  112* 113* 113*  --  112*  CO2 25  --  26 25 24   --  24  GLUCOSE 168*  --  117* 176* 191*  --  252*  BUN 37*  --  32* 31* 33*  --  29*  CREATININE 1.69*  --  1.72* 1.74* 1.61*  --  1.27*  CALCIUM 10.4*  --  10.8* 10.1 10.1  --  9.9  MG 1.7  --  1.7  --  1.9 1.7 2.0  PHOS  --   --  1.9*  --  2.2* 2.8 2.2*    CBC: Recent Labs  Lab 10/04/2020 1442 10/04/20 0632 10/04/20 0923 10/04/20 1517 10/05/20 0450  WBC 21.1* 13.1*  --  13.4* 16.0*  NEUTROABS 18.3*  --   --   --   --   HGB 14.7 13.6 11.9* 12.1* 10.5*  HCT 46.2 43.5 35.0* 39.5 33.2*  MCV 98.3 98.2  --  100.3* 98.2  PLT 313 233  --  208 151     Coagulation Studies: Recent Labs    10/04/20 1504  LABPROT 16.7*  INR 1.4*    Imaging No new brain imaging overnight  ASSESSMENT AND PLAN: 70 year old male with history of Zavitz disease, 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  Acute encephalopathy Bilateral upper extremity twitching Hypernatremia Diabetic ketoacidosis Epilepsy -Encephalopathy most likely secondary to possible infection, hypernatremia, DKA, AKI in setting of poor neurological reserve (dementia)  -Bilateral approximately twitching predominantly noted on stimulation.  No evidence of epileptogenic city on EEG.  Therefore low suspicion for seizures.  More likely secondary to sepsis, encephalopathy.  Recommendations -Continue Depakote 500 mg every 8 hours -Routine EEG did not show any evidence of seizures.  If concern persists, can consider prolonged EEG and increase Depakote - Will hold off on MRI brain for now as patient appears to be opening his eyes today, did not have any other focal neurological deficits and has multiple reasons for encephalopathy.  However the patient continues to be encephalopathic by Friday, will obtain an MRI brain without contrast -Continue seizure precautions - Management of rest of comorbidities per primary team  I have spent a total of  25 minutes with the patient reviewing hospital notes,  test results, labs and examining the patient as well as establishing an assessment and plan that was discussed personally  with Dr Halford Chessman.  > 50% of time was spent in direct patient care.     Zeb Comfort Epilepsy [facility] For questions after 5pm please refer to AMION to reach the Neurologist on call

## 2020-10-05 NOTE — Progress Notes (Addendum)
  Subjective:  Patient ID: Tony [name], male    DOB: 04-Feb-1951,  MRN: [id number]  Ventilated. Reviewed chart. No overnight events. Objective:   Vitals:   10/05/20 1715 10/05/20 1730  BP: (!) 66/42 (!) 81/48  Pulse: (!) 122 (!) 120  Resp: 15 13  Temp: (!) 100.4 F (38 C) 100.2 F (37.9 C)  SpO2: 97% 99%   General Ventilated  Vascular Right foot warm to touch. Unable to palpate pedal pulses.  Neurologic UTA sensation  Dermatologic (Wound) Right hallux with dry soft eschar over the nail bed. Toe slightly edematous, no erythema or ascending cellulitis   Orthopedic: UTA motor. Responds to painful stimuli.   Temp (24hrs), Avg:100 F (37.8 C), Min:98 F (36.7 C), Max:101.3 F (38.5 C)  Assessment & Plan:  Patient was evaluated and treated and all questions answered.  Right great toe osteomyelitis -Labs reviewed. Worsening leukocytosis -Vitals reviewed. Intermittent fevers today with TMax 101.3 -Discussed with Dr. Halford Chessman - per him patient should be stable for surgery tomorrow. If this changes we can delay until Friday. -Spoke with patient's brother [name]. Explained surgery, risks, and benefits. Consent form drafted and reviewed and signed by [name] who is he POA. -Plan for surgery tomorrow afternoon. NPO after 10am. -Total floor time: 57 mins  Evelina Bucy, DPM   Accessible via secure chat for questions or concerns.

## 2020-10-05 NOTE — Progress Notes (Signed)
eLink Physician-Brief Progress Note Patient Name: Tony [name] DOB: July 22, 1951 MRN: [id number]   Date of Service  10/05/2020  HPI/Events of Note  Agitation - Patient getting Fentanyl 75-100 mcg IV Q 1 hour. Nursing request for Fentanyl IV infusion.   eICU Interventions  Plan: 1. Fentanyl IV infusion. Titrate to RASS = 0.      Intervention Category Major Interventions: Delirium, psychosis, severe agitation - evaluation and management  Sommer,Steven Eugene 10/05/2020, 10:35 PM

## 2020-10-05 NOTE — H&P (View-Only) (Signed)
  Subjective:  Patient ID: Tony [name], male    DOB: April 27, 1951,  MRN: [id number]  Ventilated. Reviewed chart. No overnight events. Objective:   Vitals:   10/05/20 1715 10/05/20 1730  BP: (!) 66/42 (!) 81/48  Pulse: (!) 122 (!) 120  Resp: 15 13  Temp: (!) 100.4 F (38 C) 100.2 F (37.9 C)  SpO2: 97% 99%   General Ventilated  Vascular Right foot warm to touch. Unable to palpate pedal pulses.  Neurologic UTA sensation  Dermatologic (Wound) Right hallux with dry soft eschar over the nail bed. Toe slightly edematous, no erythema or ascending cellulitis   Orthopedic: UTA motor. Responds to painful stimuli.   Temp (24hrs), Avg:100 F (37.8 C), Min:98 F (36.7 C), Max:101.3 F (38.5 C)  Assessment & Plan:  Patient was evaluated and treated and all questions answered.  Right great toe osteomyelitis -Labs reviewed. Worsening leukocytosis -Vitals reviewed. Intermittent fevers today with TMax 101.3 -Discussed with Dr. Halford Chessman - per him patient should be stable for surgery tomorrow. If this changes we can delay until Friday. -Spoke with patient's brother [name]. Explained surgery, risks, and benefits. Consent form drafted and reviewed and signed by [name] who is he POA. -Plan for surgery tomorrow afternoon. NPO after 10am. -Total floor time: 57 mins  Evelina Bucy, DPM   Accessible via secure chat for questions or concerns.

## 2020-10-05 NOTE — Progress Notes (Signed)
eLink Physician-Brief Progress Note Patient Name: Tony [name] DOB: 1951-05-27 MRN: [id number]   Date of Service  10/05/2020  HPI/Events of Note  Hypotension - BP = 80/56 with MAP = 64. No CVL or CVP. Albumin = 2.2. and 2. Mg++  = 1.7 and creatinine = 1.61.   eICU Interventions  Plan: 1. 25% Albumin 25 gn IV now.  2. Replace Mg++.      Intervention Category Major Interventions: Hypotension - evaluation and management;Electrolyte abnormality - evaluation and management  Sommer,Steven Eugene 10/05/2020, 12:53 AM

## 2020-10-05 NOTE — Progress Notes (Signed)
Mesenteric artery duplex study completed.   Please see CV Proc for preliminary results.   Ananda Sitzer, RDMS, RVT  

## 2020-10-05 NOTE — Progress Notes (Signed)
SLP Cancellation Note  Patient Details Name: Tony [name] MRN: [id number] DOB: July 20, 1951   Cancelled treatment:       Reason Eval/Treat Not Completed: Patient not medically ready. Will sign off at this time.    Creg Gilmer, Katherene Ponto 10/05/2020, 7:46 AM

## 2020-10-06 ENCOUNTER — Telehealth: Payer: Self-pay | Admitting: Podiatry

## 2020-10-06 ENCOUNTER — Inpatient Hospital Stay (HOSPITAL_COMMUNITY): Payer: Medicare (Managed Care) | Admitting: Anesthesiology

## 2020-10-06 ENCOUNTER — Inpatient Hospital Stay (HOSPITAL_COMMUNITY): Payer: Medicare (Managed Care)

## 2020-10-06 ENCOUNTER — Encounter (HOSPITAL_COMMUNITY): Admission: EM | Disposition: E | Payer: Self-pay | Source: Home / Self Care | Attending: Pulmonary Disease

## 2020-10-06 DIAGNOSIS — J9601 Acute respiratory failure with hypoxia: Secondary | ICD-10-CM | POA: Diagnosis not present

## 2020-10-06 DIAGNOSIS — F0280 Dementia in other diseases classified elsewhere without behavioral disturbance: Secondary | ICD-10-CM | POA: Diagnosis not present

## 2020-10-06 DIAGNOSIS — M86671 Other chronic osteomyelitis, right ankle and foot: Secondary | ICD-10-CM

## 2020-10-06 DIAGNOSIS — J690 Pneumonitis due to inhalation of food and vomit: Secondary | ICD-10-CM | POA: Diagnosis not present

## 2020-10-06 DIAGNOSIS — G309 Alzheimer's disease, unspecified: Secondary | ICD-10-CM | POA: Diagnosis not present

## 2020-10-06 DIAGNOSIS — R4189 Other symptoms and signs involving cognitive functions and awareness: Secondary | ICD-10-CM | POA: Diagnosis not present

## 2020-10-06 DIAGNOSIS — N179 Acute kidney failure, unspecified: Secondary | ICD-10-CM | POA: Diagnosis not present

## 2020-10-06 DIAGNOSIS — M86171 Other acute osteomyelitis, right ankle and foot: Secondary | ICD-10-CM | POA: Diagnosis not present

## 2020-10-06 DIAGNOSIS — R401 Stupor: Secondary | ICD-10-CM | POA: Diagnosis not present

## 2020-10-06 HISTORY — PX: AMPUTATION TOE: SHX6595

## 2020-10-06 LAB — PHOSPHORUS: Phosphorus: 2.5 mg/dL (ref 2.5–4.6)

## 2020-10-06 LAB — GLUCOSE, CAPILLARY
Glucose-Capillary: 100 mg/dL — ABNORMAL HIGH (ref 70–99)
Glucose-Capillary: 225 mg/dL — ABNORMAL HIGH (ref 70–99)
Glucose-Capillary: 234 mg/dL — ABNORMAL HIGH (ref 70–99)
Glucose-Capillary: 247 mg/dL — ABNORMAL HIGH (ref 70–99)
Glucose-Capillary: 257 mg/dL — ABNORMAL HIGH (ref 70–99)
Glucose-Capillary: 89 mg/dL (ref 70–99)

## 2020-10-06 LAB — BASIC METABOLIC PANEL
Anion gap: 7 (ref 5–15)
BUN: 27 mg/dL — ABNORMAL HIGH (ref 8–23)
CO2: 25 mmol/L (ref 22–32)
Calcium: 10.2 mg/dL (ref 8.9–10.3)
Chloride: 114 mmol/L — ABNORMAL HIGH (ref 98–111)
Creatinine, Ser: 1.09 mg/dL (ref 0.61–1.24)
GFR, Estimated: >60 mL/min (ref >60)
Glucose, Bld: 288 mg/dL — ABNORMAL HIGH (ref 70–99)
Potassium: 3.5 mmol/L (ref 3.5–5.1)
Sodium: 146 mmol/L — ABNORMAL HIGH (ref 135–145)

## 2020-10-06 LAB — LACTIC ACID, PLASMA: Lactic Acid, Venous: 4.2 mmol/L (ref 0.5–1.9)

## 2020-10-06 LAB — MAGNESIUM: Magnesium: 1.8 mg/dL (ref 1.7–2.4)

## 2020-10-06 SURGERY — AMPUTATION, TOE
Anesthesia: General | Site: Toe | Laterality: Right

## 2020-10-06 MED ORDER — BUPIVACAINE HCL 0.5 % IJ SOLN
INTRAMUSCULAR | Status: DC | PRN
  Administered 2020-10-06: 10 mL

## 2020-10-06 MED ORDER — PHENYLEPHRINE HCL (PRESSORS) 10 MG/ML IV SOLN
INTRAVENOUS | Status: DC | PRN
  Administered 2020-10-06: 80 ug via INTRAVENOUS
  Administered 2020-10-06: 40 ug via INTRAVENOUS

## 2020-10-06 MED ORDER — INSULIN DETEMIR 100 UNIT/ML ~~LOC~~ SOLN
25.0000 [IU] | Freq: Two times a day (BID) | SUBCUTANEOUS | Status: DC
  Administered 2020-10-06 – 2020-10-08 (×5): 25 [IU] via SUBCUTANEOUS
  Filled 2020-10-06 (×6): qty 0.25

## 2020-10-06 MED ORDER — 0.9 % SODIUM CHLORIDE (POUR BTL) OPTIME
TOPICAL | Status: DC | PRN
  Administered 2020-10-06: 1000 mL

## 2020-10-06 MED ORDER — BUPIVACAINE HCL (PF) 0.5 % IJ SOLN
INTRAMUSCULAR | Status: AC
  Filled 2020-10-06: qty 10

## 2020-10-06 MED ORDER — POTASSIUM CHLORIDE 20 MEQ PO PACK
40.0000 meq | PACK | Freq: Once | ORAL | Status: AC
  Administered 2020-10-06: 40 meq
  Filled 2020-10-06: qty 2

## 2020-10-06 MED ORDER — POTASSIUM CHLORIDE 10 MEQ/100ML IV SOLN: 10.0000 meq | INTRAVENOUS | Status: DC

## 2020-10-06 MED ORDER — VANCOMYCIN HCL 1000 MG IV SOLR
INTRAVENOUS | Status: AC
  Filled 2020-10-06: qty 1000

## 2020-10-06 MED ORDER — MAGNESIUM SULFATE 2 GM/50ML IV SOLN
2.0000 g | Freq: Once | INTRAVENOUS | Status: AC
  Administered 2020-10-06: 2 g via INTRAVENOUS
  Filled 2020-10-06: qty 50

## 2020-10-06 SURGICAL SUPPLY — 32 items
BLADE LONG MED 31X9 (MISCELLANEOUS) ×2 IMPLANT
BNDG ELASTIC 3X5.8 VLCR STR LF (GAUZE/BANDAGES/DRESSINGS) ×1 IMPLANT
BNDG GAUZE ELAST 4 BULKY (GAUZE/BANDAGES/DRESSINGS) ×2 IMPLANT
CNTNR URN SCR LID CUP LEK RST (MISCELLANEOUS) IMPLANT
CONT SPEC 4OZ STRL OR WHT (MISCELLANEOUS) ×6
COVER SURGICAL LIGHT HANDLE (MISCELLANEOUS) ×2 IMPLANT
ELECT REM PT RETURN 9FT ADLT (ELECTROSURGICAL) ×2
ELECTRODE REM PT RTRN 9FT ADLT (ELECTROSURGICAL) ×1 IMPLANT
GAUZE SPONGE 4X4 12PLY STRL (GAUZE/BANDAGES/DRESSINGS) ×2 IMPLANT
GAUZE XEROFORM 1X8 LF (GAUZE/BANDAGES/DRESSINGS) ×1 IMPLANT
GLOVE BIO SURGEON STRL SZ7.5 (GLOVE) ×2 IMPLANT
GLOVE SRG 8 PF TXTR STRL LF DI (GLOVE) ×1 IMPLANT
GLOVE SURG UNDER POLY LF SZ8 (GLOVE) ×2
GOWN STRL REUS W/ TWL LRG LVL3 (GOWN DISPOSABLE) ×1 IMPLANT
GOWN STRL REUS W/ TWL XL LVL3 (GOWN DISPOSABLE) ×1 IMPLANT
GOWN STRL REUS W/TWL LRG LVL3 (GOWN DISPOSABLE) ×2
GOWN STRL REUS W/TWL XL LVL3 (GOWN DISPOSABLE) ×2
KIT BASIN OR (CUSTOM PROCEDURE TRAY) ×2 IMPLANT
KIT TURNOVER KIT B (KITS) ×2 IMPLANT
NDL HYPO 25GX1X1/2 BEV (NEEDLE) ×1 IMPLANT
NEEDLE HYPO 25GX1X1/2 BEV (NEEDLE) ×2 IMPLANT
NS IRRIG 1000ML POUR BTL (IV SOLUTION) ×2 IMPLANT
PACK ORTHO EXTREMITY (CUSTOM PROCEDURE TRAY) ×2 IMPLANT
SOL PREP POV-IOD 4OZ 10% (MISCELLANEOUS) ×2 IMPLANT
SOL PREP PROV IODINE SCRUB 4OZ (MISCELLANEOUS) ×1 IMPLANT
STAPLER VISISTAT 35W (STAPLE) ×1 IMPLANT
SUT ETHILON 3 0 PS 1 (SUTURE) ×2 IMPLANT
SUT MNCRL AB 3-0 PS2 18 (SUTURE) ×1 IMPLANT
SYR CONTROL 10ML LL (SYRINGE) ×2 IMPLANT
TOWEL GREEN STERILE (TOWEL DISPOSABLE) ×2 IMPLANT
TUBE CONNECTING 12X1/4 (SUCTIONS) ×2 IMPLANT
YANKAUER SUCT BULB TIP NO VENT (SUCTIONS) ×2 IMPLANT

## 2020-10-06 NOTE — Progress Notes (Signed)
K 3.5, Mg 1.8 Electrolytes replaced per protocol

## 2020-10-06 NOTE — Progress Notes (Signed)
Subjective: No acute events overnight.  Plan to go to the OR for amputation today.   ROS: unable to obtain due to poor mental status  Examination  Vital signs in last 24 hours: Temp:  [97.88 F (36.6 C)-102.2 F (39 C)] 101.66 F (38.7 C) (03/10 1400) Pulse Rate:  [109-139] 119 (03/10 1400) Resp:  [10-19] 10 (03/10 1400) BP: (66-145)/(42-89) 117/65 (03/10 1400) SpO2:  [89 %-100 %] 99 % (03/10 1400) FiO2 (%):  [30 %-40 %] 30 % (03/10 1153) Weight:  [85.7 kg] 85.7 kg (03/10 0416)  General: lying in bed,not in apparent distress CVS: pulse-normal rate and rhythm RS: breathing comfortably, intubated Extremities: normal  Neuro:opens eyes to verbal stimuli, does not follow commands, PERRLA, blinks to threat bilaterally, able to look at examiner, stimulation induced myoclonus in bilateral upper extremities, withdraws to noxious stimuli in all extremities  Basic Metabolic Panel: Recent Labs  Lab 10/04/20 0927 10/04/20 1353 10/04/20 1517 10/04/20 1906 10/05/20 0450 10/23/2020 0205  NA 151* 152* 151*  --  146* 146*  K 3.9 3.8 3.7  --  3.6 3.5  CL 112* 113* 113*  --  112* 114*  CO2 26 25 24   --  24 25  GLUCOSE 117* 176* 191*  --  252* 288*  BUN 32* 31* 33*  --  29* 27*  CREATININE 1.72* 1.74* 1.61*  --  1.27* 1.09  CALCIUM 10.8* 10.1 10.1  --  9.9 10.2  MG 1.7  --  1.9 1.7 2.0 1.8  PHOS 1.9*  --  2.2* 2.8 2.2* 2.5    CBC: Recent Labs  Lab 09/28/2020 1442 10/04/20 0632 10/04/20 0923 10/04/20 1517 10/05/20 0450  WBC 21.1* 13.1*  --  13.4* 16.0*  NEUTROABS 18.3*  --   --   --   --   HGB 14.7 13.6 11.9* 12.1* 10.5*  HCT 46.2 43.5 35.0* 39.5 33.2*  MCV 98.3 98.2  --  100.3* 98.2  PLT 313 233  --  208 151     Coagulation Studies: Recent Labs    10/04/20 1504  LABPROT 16.7*  INR 1.4*    Imaging No new brain imaging overnight  ASSESSMENT AND PLAN: 70 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  Epilepsy Stress-induced myoclonus -Encephalopathy most likely secondary topossible infection,hypernatremia, DKA, AKI in setting of poor neurological reserve (dementia) -Bilateral approximately twitching predominantly noted on stimulation. No evidence of epileptogenicity on EEG. Therefore low suspicion for seizures. More likely secondary to sepsis,encephalopathy.  Recommendations -Continue Depakote home dose -Continue seizure precautions - Management of rest of comorbidities per primary team -As patient is continuing to improve, neurology will sign off.  Please call us if you have any further questions.  I have spent a total of 25 minuteswith the patient reviewing hospitalnotes,  test results, labs and examining the patient as well as establishing an assessment and plan.>50% of time was spent in direct patient care.   Zeb Comfort Epilepsy [facility] For questions after 5pm please refer to AMION to reach the Neurologist on call

## 2020-10-06 NOTE — Anesthesia Postprocedure Evaluation (Signed)
Anesthesia Post Note  Patient: Tony [name]  Procedure(s) Performed: AMPUTATION TOE, right great toe (Right Toe)     Patient location during evaluation: SICU Anesthesia Type: General Level of consciousness: sedated Pain management: pain level controlled Vital Signs Assessment: post-procedure vital signs reviewed and stable Respiratory status: patient remains intubated per anesthesia plan Cardiovascular status: stable Postop Assessment: no apparent nausea or vomiting Anesthetic complications: no   No complications documented.  Last Vitals:  Vitals:   09/29/2020 2015 09/29/2020 2030  BP:  125/68  Pulse: (!) 111 (!) 111  Resp: 14 12  Temp: 37.4 C 37.5 C  SpO2: 97% 98%    Last Pain:  Vitals:   10/27/2020 2015  TempSrc: Bladder  PainSc:                  Tiajuana Amass

## 2020-10-06 NOTE — Anesthesia Preprocedure Evaluation (Signed)
Anesthesia Evaluation  Patient identified by MRN, date of birth, ID band Patient awake    Reviewed: Allergy & Precautions, NPO status , Patient's Chart, lab work & pertinent test results  Airway Mallampati: III  TM Distance: >3 FB     Dental  (+) Dental Advisory Given   Pulmonary  intubated   breath sounds clear to auscultation       Cardiovascular hypertension, Pt. on medications  Rhythm:Regular Rate:Normal     Neuro/Psych Seizures -,  PSYCHIATRIC DISORDERS    GI/Hepatic negative GI ROS, Neg liver ROS,   Endo/Other  diabetes  Renal/GU Renal disease     Musculoskeletal   Abdominal   Peds  Hematology  (+) anemia ,   Anesthesia Other Findings   Reproductive/Obstetrics                            Anesthesia Physical Anesthesia Plan  ASA: IV  Anesthesia Plan: General   Post-op Pain Management:    Induction: Inhalational  PONV Risk Score and Plan: 2 and Treatment may vary due to age or medical condition  Airway Management Planned: Oral ETT  Additional Equipment:   Intra-op Plan:   Post-operative Plan: Post-operative intubation/ventilation  Informed Consent: I have reviewed the patients History and Physical, chart, labs and discussed the procedure including the risks, benefits and alternatives for the proposed anesthesia with the patient or authorized representative who has indicated his/her understanding and acceptance.       Plan Discussed with:   Anesthesia Plan Comments:         Anesthesia Quick Evaluation

## 2020-10-06 NOTE — Op Note (Signed)
  Patient Name: Tony [name] DOB: 08/21/1950  MRN: [id number]   Date of Surgery: 09/28/2020  Surgeon: Dr. Hardie Pulley, DPM Assistants: None  Pre-operative Diagnosis:  Osteomyelitis Post-operative Diagnosis:  same Procedures:  1) Right great toe amputation - MPJ level Pathology/Specimens: ID Type Source Tests Collected by Time Destination  1 : C) Right great toe for gross ID Tissue PATH Soft tissue SURGICAL PATHOLOGY Evelina Bucy, DPM 10/11/2020 1918   A : A) Right great toe soft tissue cultures  Tissue Soft Tissue, Other FUNGUS CULTURE WITH STAIN, AEROBIC/ANAEROBIC CULTURE W GRAM STAIN (SURGICAL/DEEP WOUND) Evelina Bucy, DPM 10/15/2020 1913   B : B) Right great toe bone for cultures Tissue Soft Tissue, Other FUNGUS CULTURE WITH STAIN, AEROBIC/ANAEROBIC CULTURE W GRAM STAIN (SURGICAL/DEEP WOUND) Evelina Bucy, DPM 10/26/2020 1915    Anesthesia: General (pt intubated prior to OR) Hemostasis: * No tourniquets in log * Estimated Blood Loss: 10 mL Materials: * No implants in log * Medications: 10 ml marcaine 1.5% plain Complications: none  Indications for Procedure:  This is a 70 y.o. male with sepsis and ostoemylitis of the right great toe. Osteomyelitis thought to be source of sepsis. Amputation of the digit is indicated. Reviewed consent with patient's brother who is POA. Discussed risks/benefits of surgery and procedure.    Procedure in Detail: Patient was brought down from the ICU as he was already intubated. He was brought into the OR. The right lower extremity was marked. Anesthesia maintained sedation. The extremity was prepped and draped in the usual sterile fashion. Timeout was taken to confirm patient name, laterality, and procedure prior to incision.   Attention was then directed to the right great toe where an incision was made at the interphalangeal joint. Dissection was carried down to level of bone. Soft tissue and bone of the distal phalanx where osteomyelitis was  suspected was biopsied for microbiology. The bone was noted to be very soft.   Dissection was continued to the metatarsophalangeal joint and all collateral ligaments were freed at the joint.  The bone soft tissue attachments of the proximal phalanx were removed and passed for pathology.  The remaining metatarsal head appeared healthy and viable.  The area was copiously irrigated. The dorsal skin flap appeared less vascularized than the plantar flap and the dorsal flap was excised. The wound was copiously irrigated and the skin was reapproximated with nylon and skin staples.   The foot was then dressed with xeroform, 4x4, kerlix, and loose ACE. Patient tolerated the procedure well.  Disposition: Following a period of post-operative monitoring, patient will be transferred back to the ICU. We will monitor cultures for antibiotic adjustment. Surgical cure of OM believed today.

## 2020-10-06 NOTE — Interval H&P Note (Signed)
History and Physical Interval Note:  10/01/2020 6:39 PM  Tony [name]  has presented today for surgery, with the diagnosis of Osteomyelitis.  The various methods of treatment have been discussed with the patient and family. After consideration of risks, benefits and other options for treatment, the patient has consented to  Procedure(s) with comments: AMPUTATION TOE (Right) - Ok to leave in bed as a surgical intervention.  The patient's history has been reviewed, patient examined, no change in status, stable for surgery.  I have reviewed the patient's chart and labs.  Questions were answered to the patient's satisfaction.     Evelina Bucy

## 2020-10-06 NOTE — Telephone Encounter (Signed)
Called patient's brother for post-op update.

## 2020-10-06 NOTE — Transfer of Care (Signed)
Immediate Anesthesia Transfer of Care Note  Patient: Tony [name]  Procedure(s) Performed: AMPUTATION TOE, right great toe (Right Toe)  Patient Location: PACU  Anesthesia Type:General  Level of Consciousness: Patient remains intubated per anesthesia plan  Airway & Oxygen Therapy: Patient remains intubated per anesthesia plan and Patient placed on Ventilator (see vital sign flow sheet for setting)  Post-op Assessment: Report given to RN and Post -op Vital signs reviewed and stable  Post vital signs: Reviewed and stable  Last Vitals:  Vitals Value Taken Time  BP    Temp    Pulse 107 10/01/2020 1951  Resp 14 10/07/2020 1951  SpO2 97 % 10/13/2020 1951    Last Pain:  Vitals:   10/14/2020 1600  TempSrc: Bladder  PainSc:          Complications: No complications documented.

## 2020-10-06 NOTE — Progress Notes (Signed)
NAME:  Tony [name], MRN:  [id number], DOB:  April 13, 1951, LOS: 3 ADMISSION DATE:  10-28-20, CONSULTATION DATE: 3/8 REFERRING MD: Dr. Rosine Door, CHIEF COMPLAINT: Altered mental status  Brief History:  70 yo male admitted from [facility] on 3/07 with AMS, fever (Tm 101.7), tachycardia, lactic acidosis and hyperglycemia.  Required intubation for airway protection and transferred to ICU on 3/08.  Past Medical History:  Alzheimer's dementia, BPH, CKD 3a, DM type 2, HTN, Seizure disorder, CVA  Significant Hospital Events:  3/7 admitted for sepsis/DKA 3/8 unresponsive, intubated, transferred to ICU  Consults:  Neurology  Procedures:  ETT 3/8 >  Significant Diagnostic Tests:   CT head 3/7 > no evidence of acute intracranial abnormality  X-ray right foot 3/7 > no gross findings suspicious of osteomyelitis  MRI right foot 3/8 > osteomyelitis of 1st distal phalanx and 1st proximal phalanx, plantar muscle edema concerning for myositis  CT head 3/8 > remote b/l cerebellar infarcts  CT abdomen pelvis 3/8 > bibasilar consolidation, heavy stool burden throughout colon, diverticulosis of descending and sigmoid colon  LP 3/8 > glucose 139, protein 113, RBC 48, WBC 6  EEG 3/8 > generalized slowing  Micro Data:  COVID/Flu 3/7 > negative MRSA PCR 3/7 > negative Blood culture 3/7 > Urine culture 3/7 > multiple species tracheal aspirate 3/8 > CSF 3/8 >  Antimicrobials:  Cefepime 3/7 > 3/8 Vancomycin 3/7 > 3/8 Zosyn 3/8 > Daptomycin 3/8 >  Interim History / Subjective:  Remains on vent, sedation.  Objective   Blood pressure 118/65, pulse (!) 116, temperature 98.42 F (36.9 C), resp. rate 14, height 6' (1.829 m), weight 85.7 kg, SpO2 100 %.    Vent Mode: PRVC FiO2 (%):  [30 %-40 %] 30 % Set Rate:  [12 bmp] 12 bmp Vt Set:  [550 mL] 550 mL PEEP:  [5 cmH20] 5 cmH20 Plateau Pressure:  [13 cmH20-16 cmH20] 16 cmH20   Intake/Output Summary (Last 24 hours) at 10/05/2020  0741 Last data filed at 10/19/2020 0700 Gross per 24 hour  Intake 4021.91 ml  Output 2015 ml  Net 2006.91 ml   Filed Weights   10-28-20 1451 10/05/20 0500 10/17/2020 0416  Weight: 84.5 kg 85.7 kg 85.7 kg    Examination:  General - sedated Eyes - pupils reactive ENT - ETT in place Cardiac - regular rate/rhythm, no murmur Chest - equal breath sounds b/l, no wheezing or rales Abdomen - soft, non tender, + bowel sounds Extremities - no edema Skin - chronic changes on Rt foot Neuro - RASS -1, opens eyes with stimulation   Resolved Hospital Problem list   Hypernatremia, DKA  Assessment & Plan:   Acute hypoxic respiratory failure with compromised airway in setting of aspiration pneumonitis. - full vent support - f/u CXR - goal SpO2 > 92%  Sepsis from aspiration pneumonia and chronic osteomyelitis of Rt foot. - day 4 of ABx, currently on daptomycin and zosyn per ID - for surgery with podiatry on 3/84  Acute metabolic encephalopathy from sepsis, hyperglycemia. Hx of seizures, Alzheimer dementia, CVA. - RASS goal 0 to -1 - continue depakote - hold outpt aricept, paxil, namenda  DM type 2 poorly controlled with hyperglycemia. - SSI with tube feed coverage  - change levemir to 25 units BID  AKI from ATN in setting of sepsis. CKD 3a. Lactic acidosis. Hx of BPH. - baseline creatinine 1.3 - continue IV fluids - f/u BMET - monitor urine outpt - hold outpt flomax  Anemia  of critical illness and chronic disease. - f/u CBC - transfuse for Hb < 7 or significant bleeding  Hx of HTN. - hold outpt norvasc  Best practice (evaluated daily)  Diet: tube feeds DVT prophylaxis: Lovenox GI prophylaxis: PPI Glucose control: Endotool Mobility: Bedrest Disposition:ICU Code Status: FULL  Labs    CMP Latest Ref Rng & Units 10/25/2020 10/05/2020 10/04/2020  Glucose 70 - 99 mg/dL 288(H) 252(H) -  BUN 8 - 23 mg/dL 27(H) 29(H) -  Creatinine 0.61 - 1.24 mg/dL 1.09 1.27(H) -  Sodium  135 - 145 mmol/L 146(H) 146(H) -  Potassium 3.5 - 5.1 mmol/L 3.5 3.6 -  Chloride 98 - 111 mmol/L 114(H) 112(H) -  CO2 22 - 32 mmol/L 25 24 -  Calcium 8.9 - 10.3 mg/dL 10.2 9.9 -  Total Protein 6.5 - 8.1 g/dL - - 6.5  Total Bilirubin 0.3 - 1.2 mg/dL - - 0.8  Alkaline Phos 38 - 126 U/L - - 51  AST 15 - 41 U/L - - 21  ALT 0 - 44 U/L - - 9    CBC Latest Ref Rng & Units 10/05/2020 10/04/2020 10/04/2020  WBC 4.0 - 10.5 K/uL 16.0(H) 13.4(H) -  Hemoglobin 13.0 - 17.0 g/dL 10.5(L) 12.1(L) 11.9(L)  Hematocrit 39.0 - 52.0 % 33.2(L) 39.5 35.0(L)  Platelets 150 - 400 K/uL 151 208 -    ABG    Component Value Date/Time   PHART 7.529 (H) 10/04/2020 0923   PCO2ART 32.1 10/04/2020 0923   PO2ART 174 (H) 10/04/2020 0923   HCO3 26.4 10/04/2020 0923   TCO2 27 10/04/2020 0923   ACIDBASEDEF 3.7 (H) 02/07/2010 1730   O2SAT 100.0 10/04/2020 0923    CBG (last 3)  Recent Labs    10/05/20 2012 10/05/20 2340 10/12/2020 0338  GLUCAP 251* 243* 247*    Critical care time: 32 minutes  Chesley Mires, MD [facility] Pager - [phone] 10/19/2020, 7:41 AM

## 2020-10-06 NOTE — Progress Notes (Signed)
[facility] for Infectious Disease  Date of Admission:  10/23/2020     Total days of antibiotics 4         ASSESSMENT:  [name] fever curve continues to down trend. Cultures have remained without growth. On pressure support and partially opens eyes to mild tactile stimulation. Scheduled for great toe amputation with podiatry. CK yesterday 138. Continue broad spectrum coverage with daptomycin and Pip/tazo. Duration of therapy pending surgical and culture results. Respiratory management per CCM.   PLAN:  1. Continue Daptomycin and Pip/tazo 2. Monitor CK levels per protocol for therapeutic drug monitoring.  3. Great toe amputation with podiatry.  4. Ventilatory management per CCM.   Principal Problem:   Altered mental status Active Problems:   Hypertension   Dementia (HCC)   Acute renal failure superimposed on stage 3b chronic kidney disease (HCC)   Sepsis ([facility])   DKA (diabetic ketoacidosis) (HCC)   Ulcer of great toe, right, with unspecified severity ([facility])   Unresponsive   Endotracheal tube present   Acute osteomyelitis of toe, right ([facility])   . chlorhexidine gluconate (MEDLINE KIT)  15 mL Mouth Rinse BID  . Chlorhexidine Gluconate Cloth  6 each Topical Daily  . docusate  100 mg Per Tube BID  . enoxaparin (LOVENOX) injection  40 mg Subcutaneous Q24H  . feeding supplement (PROSource TF)  45 mL Per Tube QID  . insulin aspart  0-15 Units Subcutaneous Q4H  . insulin aspart  4 Units Subcutaneous Q4H  . insulin detemir  25 Units Subcutaneous BID  . mouth rinse  15 mL Mouth Rinse 10 times per day  . multivitamin with minerals  1 tablet Per Tube Daily  . mupirocin ointment  1 application Topical BID  . pantoprazole sodium  40 mg Per Tube Q24H  . polyethylene glycol  17 g Per Tube Daily  . thiamine  100 mg Per Tube Daily  . valproic acid  500 mg Per Tube Q8H    SUBJECTIVE:  Lethargic on ventilator. Partially opens eyes with mild tactile stimulation. Not alert or  following commands.   No Known Allergies   Review of Systems: Review of Systems  Unable to perform ROS: Intubated      OBJECTIVE: Vitals:   10/04/2020 0700 10/20/2020 0800 10/25/2020 0900 10/02/2020 1000  BP: 118/65 118/68 114/65 104/69  Pulse: (!) 116 (!) 116 (!) 120 (!) 130  Resp: '14 13 16 16  ' Temp: 98.42 F (36.9 C) 98.96 F (37.2 C) 99.5 F (37.5 C) (!) 100.4 F (38 C)  TempSrc:  Bladder    SpO2: 100% 100% 99% 98%  Weight:      Height:       Body mass index is 25.62 kg/m.  Physical Exam Constitutional:      General: He is not in acute distress.    Appearance: He is well-developed. He is ill-appearing.     Interventions: He is intubated and restrained.     Comments: Lying in bed with head of bed elevated.   Cardiovascular:     Rate and Rhythm: Normal rate and regular rhythm.     Heart sounds: Normal heart sounds.     Comments: Bilateral lower extremities cool to the touch distal to the knee bilaterally.  Pulmonary:     Effort: Pulmonary effort is normal. He is intubated.     Breath sounds: Normal breath sounds.  Skin:    General: Skin is warm and dry.  Neurological:     Comments:  Partially opens eyes to mild tactile stimulation. Does not track nor follow commands      Lab Results Lab Results  Component Value Date   WBC 16.0 (H) 10/05/2020   HGB 10.5 (L) 10/05/2020   HCT 33.2 (L) 10/05/2020   MCV 98.2 10/05/2020   PLT 151 10/05/2020    Lab Results  Component Value Date   CREATININE 1.09 09/29/2020   BUN 27 (H) 10/16/2020   NA 146 (H) 10/26/2020   K 3.5 10/14/2020   CL 114 (H) 10/09/2020   CO2 25 10/12/2020    Lab Results  Component Value Date   ALT 9 10/04/2020   AST 21 10/04/2020   ALKPHOS 51 10/04/2020   BILITOT 0.8 10/04/2020     Microbiology: Recent Results (from the past 240 hour(s))  Urine culture     Status: Abnormal   Collection Time: 10/12/2020  2:42 PM   Specimen: Urine, Random  Result Value Ref Range Status   Specimen Description  URINE, RANDOM  Final   Special Requests   Final    NONE Performed at [facility], [address] [place], [place] [address]    Culture MULTIPLE SPECIES PRESENT, SUGGEST RECOLLECTION (A)  Final   Report Status 10/05/2020 FINAL  Final  Resp Panel by RT-PCR (Flu A&B, Covid) Nasopharyngeal Swab     Status: None   Collection Time: 09/30/2020  2:55 PM   Specimen: Nasopharyngeal Swab; Nasopharyngeal(NP) swabs in vial transport medium  Result Value Ref Range Status   SARS Coronavirus 2 by RT PCR NEGATIVE NEGATIVE Final    Comment: (NOTE) SARS-CoV-2 target nucleic acids are NOT DETECTED.  The SARS-CoV-2 RNA is generally detectable in upper respiratory specimens during the acute phase of infection. The lowest concentration of SARS-CoV-2 viral copies this assay can detect is 138 copies/mL. A negative result does not preclude SARS-Cov-2 infection and should not be used as the sole basis for treatment or other patient management decisions. A negative result may occur with  improper specimen collection/handling, submission of specimen other than nasopharyngeal swab, presence of viral mutation(s) within the areas targeted by this assay, and inadequate number of viral copies(<138 copies/mL). A negative result must be combined with clinical observations, patient history, and epidemiological information. The expected result is Negative.  Fact Sheet for Patients:  EntrepreneurPulse.com.au  Fact Sheet for Healthcare Providers:  IncredibleEmployment.be  This test is no t yet approved or cleared by the [place] FDA and  has been authorized for detection and/or diagnosis of SARS-CoV-2 by FDA under an Emergency Use Authorization (EUA). This EUA will remain  in effect (meaning this test can be used) for the duration of the COVID-19 declaration under Section 564(b)(1) of the Act, 21 U.S.C.section 360bbb-3(b)(1), unless the authorization is terminated  or  revoked sooner.       Influenza A by PCR NEGATIVE NEGATIVE Final   Influenza B by PCR NEGATIVE NEGATIVE Final    Comment: (NOTE) The Xpert Xpress SARS-CoV-2/FLU/RSV plus assay is intended as an aid in the diagnosis of influenza from Nasopharyngeal swab specimens and should not be used as a sole basis for treatment. Nasal washings and aspirates are unacceptable for Xpert Xpress SARS-CoV-2/FLU/RSV testing.  Fact Sheet for Patients: EntrepreneurPulse.com.au  Fact Sheet for Healthcare Providers: IncredibleEmployment.be  This test is not yet approved or cleared by the [place] FDA and has been authorized for detection and/or diagnosis of SARS-CoV-2 by FDA under an Emergency Use Authorization (EUA). This EUA will remain in effect (meaning  this test can be used) for the duration of the COVID-19 declaration under Section 564(b)(1) of the Act, 21 U.S.C. section 360bbb-3(b)(1), unless the authorization is terminated or revoked.  Performed at [facility], [place] [address] [place], [place] [address]   Blood culture (routine x 2)     Status: None (Preliminary result)   Collection Time: 09/27/2020  6:59 PM   Specimen: BLOOD  Result Value Ref Range Status   Specimen Description BLOOD SITE NOT SPECIFIED  Final   Special Requests   Final    BOTTLES DRAWN AEROBIC AND ANAEROBIC Blood Culture results may not be optimal due to an inadequate volume of blood received in culture bottles   Culture   Final    NO GROWTH 2 DAYS Performed at [facility], [place] [address] [place], [place] [address]    Report Status PENDING  Incomplete  Blood culture (routine x 2)     Status: None (Preliminary result)   Collection Time: 10/04/2020  7:00 PM   Specimen: BLOOD  Result Value Ref Range Status   Specimen Description BLOOD LEFT ANTECUBITAL  Final   Special Requests   Final    BOTTLES DRAWN AEROBIC AND ANAEROBIC Blood Culture adequate volume   Culture   Final     NO GROWTH 2 DAYS Performed at [facility], [place] [address], [place], [place] [address]    Report Status PENDING  Incomplete  Culture, Respiratory w Gram Stain     Status: None (Preliminary result)   Collection Time: 10/04/20  8:24 AM   Specimen: Tracheal Aspirate; Respiratory  Result Value Ref Range Status   Specimen Description TRACHEAL ASPIRATE  Final   Special Requests NONE  Final   Gram Stain   Final    MODERATE WBC PRESENT,BOTH PMN AND MONONUCLEAR RARE SQUAMOUS EPITHELIAL CELLS PRESENT FEW GRAM POSITIVE RODS RARE BUDDING YEAST SEEN    Culture   Final    CULTURE REINCUBATED FOR BETTER GROWTH Performed at [facility], [place] [address], [place], [place] [address]    Report Status PENDING  Incomplete  MRSA PCR Screening     Status: None   Collection Time: 10/04/20  8:56 AM   Specimen: Nasopharyngeal  Result Value Ref Range Status   MRSA by PCR NEGATIVE NEGATIVE Final    Comment:        The GeneXpert MRSA Assay (FDA approved for NASAL specimens only), is one component of a comprehensive MRSA colonization surveillance program. It is not intended to diagnose MRSA infection nor to guide or monitor treatment for MRSA infections. Performed at [facility], [place] [address] [place], [place] [address]   CSF culture w Gram Stain     Status: None (Preliminary result)   Collection Time: 10/04/20  5:01 PM   Specimen: PATH Cytology CSF; Cerebrospinal Fluid  Result Value Ref Range Status   Specimen Description CSF  Final   Special Requests NONE  Final   Gram Stain   Final    WBC PRESENT, PREDOMINANTLY MONONUCLEAR NO ORGANISMS SEEN CYTOSPIN SMEAR    Culture   Final    NO GROWTH 2 DAYS Performed at [facility], [place] [address], [place], [place] [address]    Report Status PENDING  Incomplete  Fungus Culture With Stain     Status: None (Preliminary result)   Collection Time: 10/04/20  5:01 PM   Specimen: PATH Cytology CSF; Cerebrospinal Fluid  Result  Value Ref Range Status  Fungus Stain Final report  Final    Comment: (NOTE) Performed At: [facility] [place], [place] [id number] Rush Farmer MD [id number]    Fungus (Mycology) Culture PENDING  Incomplete   Fungal Source CSF  Final    Comment: Performed at [facility], [place] [address], [place], [place] [address]  Fungus Culture Result     Status: None   Collection Time: 10/04/20  5:01 PM  Result Value Ref Range Status   Result 1 Comment  Final    Comment: (NOTE) KOH/Calcofluor preparation:  no fungus observed. Performed At: [facility] [address] [place], [place] [id number] Rush Farmer MD [id number]      Terri Piedra, [facility]  10/18/2020  10:58 AM

## 2020-10-06 NOTE — Telephone Encounter (Signed)
Spoke with patient's brother/POA. Called but he was on the floor and we spoke in person. Reviewed consent form.

## 2020-10-07 ENCOUNTER — Encounter (HOSPITAL_COMMUNITY): Payer: Self-pay | Admitting: Podiatry

## 2020-10-07 ENCOUNTER — Inpatient Hospital Stay (HOSPITAL_COMMUNITY): Payer: Medicare (Managed Care)

## 2020-10-07 DIAGNOSIS — Z978 Presence of other specified devices: Secondary | ICD-10-CM

## 2020-10-07 DIAGNOSIS — R401 Stupor: Secondary | ICD-10-CM | POA: Diagnosis not present

## 2020-10-07 DIAGNOSIS — M86171 Other acute osteomyelitis, right ankle and foot: Secondary | ICD-10-CM | POA: Diagnosis not present

## 2020-10-07 DIAGNOSIS — N179 Acute kidney failure, unspecified: Secondary | ICD-10-CM | POA: Diagnosis not present

## 2020-10-07 DIAGNOSIS — G309 Alzheimer's disease, unspecified: Secondary | ICD-10-CM | POA: Diagnosis not present

## 2020-10-07 DIAGNOSIS — R4182 Altered mental status, unspecified: Secondary | ICD-10-CM | POA: Diagnosis not present

## 2020-10-07 DIAGNOSIS — M869 Osteomyelitis, unspecified: Secondary | ICD-10-CM

## 2020-10-07 LAB — CBC
HCT: 29 % — ABNORMAL LOW (ref 39.0–52.0)
Hemoglobin: 9.4 g/dL — ABNORMAL LOW (ref 13.0–17.0)
MCH: 31.3 pg (ref 26.0–34.0)
MCHC: 32.4 g/dL (ref 30.0–36.0)
MCV: 96.7 fL (ref 80.0–100.0)
Platelets: 148 10*3/uL — ABNORMAL LOW (ref 150–400)
RBC: 3 MIL/uL — ABNORMAL LOW (ref 4.22–5.81)
RDW: 13 % (ref 11.5–15.5)
WBC: 14 10*3/uL — ABNORMAL HIGH (ref 4.0–10.5)
nRBC: 0 % (ref 0.0–0.2)

## 2020-10-07 LAB — BASIC METABOLIC PANEL
Anion gap: 7 (ref 5–15)
BUN: 23 mg/dL (ref 8–23)
CO2: 28 mmol/L (ref 22–32)
Calcium: 9.8 mg/dL (ref 8.9–10.3)
Chloride: 110 mmol/L (ref 98–111)
Creatinine, Ser: 1.13 mg/dL (ref 0.61–1.24)
GFR, Estimated: >60 mL/min (ref >60)
Glucose, Bld: 225 mg/dL — ABNORMAL HIGH (ref 70–99)
Potassium: 3.8 mmol/L (ref 3.5–5.1)
Sodium: 145 mmol/L (ref 135–145)

## 2020-10-07 LAB — URINE CULTURE
Culture: NO GROWTH
Special Requests: NORMAL

## 2020-10-07 LAB — CULTURE, RESPIRATORY W GRAM STAIN: Culture: NORMAL

## 2020-10-07 LAB — GLUCOSE, CAPILLARY
Glucose-Capillary: 190 mg/dL — ABNORMAL HIGH (ref 70–99)
Glucose-Capillary: 281 mg/dL — ABNORMAL HIGH (ref 70–99)
Glucose-Capillary: 255 mg/dL — ABNORMAL HIGH (ref 70–99)
Glucose-Capillary: 259 mg/dL — ABNORMAL HIGH (ref 70–99)
Glucose-Capillary: 281 mg/dL — ABNORMAL HIGH (ref 70–99)

## 2020-10-07 MED ORDER — MEMANTINE HCL 10 MG PO TABS
10.0000 mg | ORAL_TABLET | Freq: Two times a day (BID) | ORAL | Status: DC
  Administered 2020-10-07 – 2020-10-15 (×17): 10 mg
  Filled 2020-10-07 (×18): qty 1

## 2020-10-07 MED ORDER — MEMANTINE HCL 10 MG PO TABS
10.0000 mg | ORAL_TABLET | Freq: Two times a day (BID) | ORAL | Status: DC
  Filled 2020-10-07: qty 1

## 2020-10-07 MED ORDER — PAROXETINE HCL 20 MG PO TABS
20.0000 mg | ORAL_TABLET | Freq: Every day | ORAL | Status: DC
  Administered 2020-10-07 – 2020-10-08 (×2): 20 mg
  Filled 2020-10-07 (×2): qty 1

## 2020-10-07 MED ORDER — INSULIN ASPART 100 UNIT/ML ~~LOC~~ SOLN
7.0000 [IU] | SUBCUTANEOUS | Status: DC
  Administered 2020-10-07 – 2020-10-13 (×35): 7 [IU] via SUBCUTANEOUS

## 2020-10-07 MED ORDER — DEXMEDETOMIDINE HCL IN NACL 400 MCG/100ML IV SOLN
0.4000 ug/kg/h | INTRAVENOUS | Status: DC
  Administered 2020-10-07: 0.4 ug/kg/h via INTRAVENOUS
  Filled 2020-10-07: qty 100

## 2020-10-07 MED ORDER — PAROXETINE HCL ER 12.5 MG PO TB24: 12.5000 mg | ORAL_TABLET | Freq: Every day | ORAL | Status: DC

## 2020-10-07 MED ORDER — DONEPEZIL HCL 10 MG PO TABS
10.0000 mg | ORAL_TABLET | Freq: Every day | ORAL | Status: DC
  Administered 2020-10-07 – 2020-10-14 (×8): 10 mg
  Filled 2020-10-07 (×9): qty 1

## 2020-10-07 NOTE — Progress Notes (Signed)
  Subjective:  Patient ID: Tony [name], male    DOB: [birth year],  MRN: [id number]  Remains intubated. Objective:   Vitals:   10/07/20 0824 10/07/20 1142  BP:  (!) 72/52  Pulse:  (!) 118  Resp:  12  Temp:    SpO2: 99% 100%   General AA&O x3. Normal mood and affect.  Vascular Leg and remaining digits warm to touch. Calf supple.  Neurologic Epicritic sensation grossly intact.  Dermatologic Dressing c/d/i  Orthopedic: Responds to painful stimuli   Temp (24hrs), Avg:100.1 F (37.8 C), Min:99.3 F (37.4 C), Max:102.2 F (39 C)  Assessment & Plan:  Patient was evaluated and treated and all questions answered.  R Great Toe Osteomyelitis -Bone and ST cultures pending. -Path pending -Leave dressing intact for today. Plan for change tomorrow. -Abx per ID. -Will continue to follow.  Evelina Bucy, DPM  Accessible via secure chat for questions or concerns.

## 2020-10-07 NOTE — Progress Notes (Signed)
Subjective: Intubated   Antibiotics:  Anti-infectives (From admission, onward)   Start     Dose/Rate Route Frequency Ordered Stop   10/04/20 1530  ceFEPIme (MAXIPIME) 2 g in sodium chloride 0.9 % 100 mL IVPB  Status:  Discontinued        2 g 200 mL/hr over 30 Minutes Intravenous Every 24 hours 09/30/2020 1615 10/04/20 0905   10/04/20 1130  DAPTOmycin (CUBICIN) 750 mg in sodium chloride 0.9 % IVPB        750 mg 230 mL/hr over 30 Minutes Intravenous Daily 10/04/20 1042     10/04/20 1000  piperacillin-tazobactam (ZOSYN) IVPB 3.375 g        3.375 g 12.5 mL/hr over 240 Minutes Intravenous Every 8 hours 10/04/20 0920     10/13/2020 1615  vancomycin variable dose per unstable renal function (pharmacist dosing)  Status:  Discontinued         Does not apply See admin instructions 10/07/2020 1615 10/04/20 1042   10/20/2020 1515  ceFEPIme (MAXIPIME) 2 g in sodium chloride 0.9 % 100 mL IVPB        2 g 200 mL/hr over 30 Minutes Intravenous  Once 10/19/2020 1505 10/12/2020 1627   10/14/2020 1515  metroNIDAZOLE (FLAGYL) IVPB 500 mg        500 mg 100 mL/hr over 60 Minutes Intravenous  Once 10/01/2020 1505 10/22/2020 1645   09/28/2020 1515  vancomycin (VANCOREADY) IVPB 1750 mg/350 mL        1,750 mg 175 mL/hr over 120 Minutes Intravenous  Once 10/08/2020 1505 10/24/2020 1857      Medications: Scheduled Meds: . chlorhexidine gluconate (MEDLINE KIT)  15 mL Mouth Rinse BID  . Chlorhexidine Gluconate Cloth  6 each Topical Daily  . docusate  100 mg Per Tube BID  . donepezil  10 mg Per Tube QHS  . enoxaparin (LOVENOX) injection  40 mg Subcutaneous Q24H  . feeding supplement (PROSource TF)  45 mL Per Tube QID  . insulin aspart  0-15 Units Subcutaneous Q4H  . insulin aspart  7 Units Subcutaneous Q4H  . insulin detemir  25 Units Subcutaneous BID  . mouth rinse  15 mL Mouth Rinse 10 times per day  . memantine  10 mg Per Tube BID  . multivitamin with minerals  1 tablet Per Tube Daily  . mupirocin ointment  1  application Topical BID  . pantoprazole sodium  40 mg Per Tube Q24H  . PARoxetine  20 mg Per Tube Daily  . polyethylene glycol  17 g Per Tube Daily  . thiamine  100 mg Per Tube Daily  . valproic acid  500 mg Per Tube Q8H   Continuous Infusions: . sodium chloride Stopped (10/05/2020 0609)  . DAPTOmycin (CUBICIN)  IV Stopped (10/05/20 2207)  . dexmedetomidine (PRECEDEX) IV infusion 0.5 mcg/kg/hr (10/07/20 0940)  . feeding supplement (VITAL 1.5 CAL) 55 mL/hr at 10/05/20 1600  . fentaNYL infusion INTRAVENOUS 100 mcg/hr (10/07/20 0912)  . piperacillin-tazobactam (ZOSYN)  IV 3.375 g (10/07/20 0924)   PRN Meds:.sodium chloride, acetaminophen (TYLENOL) oral liquid 160 mg/5 mL **OR** acetaminophen, dextrose, fentaNYL (SUBLIMAZE) injection, midazolam, ondansetron **OR** ondansetron (ZOFRAN) IV    Objective: Weight change: 2.6 kg  Intake/Output Summary (Last 24 hours) at 10/07/2020 1050 Last data filed at 10/07/2020 0800 Gross per 24 hour  Intake 1602.98 ml  Output 1135 ml  Net 467.98 ml   Blood pressure 103/60, pulse (!) 112, temperature 99.3 F (37.4 C), resp. rate  12, height 6' (1.829 m), weight 88.3 kg, SpO2 99 %. Temp:  [99.3 F (37.4 C)-102.2 F (39 C)] 99.3 F (37.4 C) (03/11 0800) Pulse Rate:  [107-139] 112 (03/11 0800) Resp:  [10-17] 12 (03/11 0800) BP: (82-133)/(55-79) 103/60 (03/11 0700) SpO2:  [95 %-100 %] 99 % (03/11 0824) FiO2 (%):  [30 %] 30 % (03/11 0824) Weight:  [88.3 kg] 88.3 kg (03/11 0430)  Physical Exam: Physical Exam Constitutional:      Appearance: He is ill-appearing.     Interventions: He is intubated.  HENT:     Head: Normocephalic.  Cardiovascular:     Rate and Rhythm: Tachycardia present.  Pulmonary:     Effort: He is intubated.  Abdominal:     General: There is no distension.  Skin:    General: Skin is warm and dry.  Neurological:     General: No focal deficit present.     Foot bandaged at site of amputation  CBC:    BMET Recent Labs     10/20/2020 0205 10/07/20 0301  NA 146* 145  K 3.5 3.8  CL 114* 110  CO2 25 28  GLUCOSE 288* 225*  BUN 27* 23  CREATININE 1.09 1.13  CALCIUM 10.2 9.8     Liver Panel  Recent Labs    10/04/20 1906  PROT 6.5  ALBUMIN 2.2*  AST 21  ALT 9  ALKPHOS 51  BILITOT 0.8  BILIDIR 0.1  IBILI 0.7       Sedimentation Rate No results for input(s): ESRSEDRATE in the last 72 hours. C-Reactive Protein No results for input(s): CRP in the last 72 hours.  Micro Results: Recent Results (from the past 720 hour(s))  Urine culture     Status: Abnormal   Collection Time: 10/12/2020  2:42 PM   Specimen: Urine, Random  Result Value Ref Range Status   Specimen Description URINE, RANDOM  Final   Special Requests   Final    NONE Performed at [facility], [address], [place], [place] [address]    Culture MULTIPLE SPECIES PRESENT, SUGGEST RECOLLECTION (A)  Final   Report Status 10/05/2020 FINAL  Final  Resp Panel by RT-PCR (Flu A&B, Covid) Nasopharyngeal Swab     Status: None   Collection Time: 10/23/2020  2:55 PM   Specimen: Nasopharyngeal Swab; Nasopharyngeal(NP) swabs in vial transport medium  Result Value Ref Range Status   SARS Coronavirus 2 by RT PCR NEGATIVE NEGATIVE Final    Comment: (NOTE) SARS-CoV-2 target nucleic acids are NOT DETECTED.  The SARS-CoV-2 RNA is generally detectable in upper respiratory specimens during the acute phase of infection. The lowest concentration of SARS-CoV-2 viral copies this assay can detect is 138 copies/mL. A negative result does not preclude SARS-Cov-2 infection and should not be used as the sole basis for treatment or other patient management decisions. A negative result may occur with  improper specimen collection/handling, submission of specimen other than nasopharyngeal swab, presence of viral mutation(s) within the areas targeted by this assay, and inadequate number of viral copies(<138 copies/mL). A negative result must be  combined with clinical observations, patient history, and epidemiological information. The expected result is Negative.  Fact Sheet for Patients:  EntrepreneurPulse.com.au  Fact Sheet for Healthcare Providers:  IncredibleEmployment.be  This test is no t yet approved or cleared by the [place] FDA and  has been authorized for detection and/or diagnosis of SARS-CoV-2 by FDA under an Emergency Use Authorization (EUA). This EUA will remain  in  effect (meaning this test can be used) for the duration of the COVID-19 declaration under Section 564(b)(1) of the Act, 21 U.S.C.section 360bbb-3(b)(1), unless the authorization is terminated  or revoked sooner.       Influenza A by PCR NEGATIVE NEGATIVE Final   Influenza B by PCR NEGATIVE NEGATIVE Final    Comment: (NOTE) The Xpert Xpress SARS-CoV-2/FLU/RSV plus assay is intended as an aid in the diagnosis of influenza from Nasopharyngeal swab specimens and should not be used as a sole basis for treatment. Nasal washings and aspirates are unacceptable for Xpert Xpress SARS-CoV-2/FLU/RSV testing.  Fact Sheet for Patients: EntrepreneurPulse.com.au  Fact Sheet for Healthcare Providers: IncredibleEmployment.be  This test is not yet approved or cleared by the [place] FDA and has been authorized for detection and/or diagnosis of SARS-CoV-2 by FDA under an Emergency Use Authorization (EUA). This EUA will remain in effect (meaning this test can be used) for the duration of the COVID-19 declaration under Section 564(b)(1) of the Act, 21 U.S.C. section 360bbb-3(b)(1), unless the authorization is terminated or revoked.  Performed at [facility], [place] [address] [place], [place] [address]   Blood culture (routine x 2)     Status: None (Preliminary result)   Collection Time: 10/10/2020  6:59 PM   Specimen: BLOOD  Result Value Ref Range Status   Specimen  Description BLOOD SITE NOT SPECIFIED  Final   Special Requests   Final    BOTTLES DRAWN AEROBIC AND ANAEROBIC Blood Culture results may not be optimal due to an inadequate volume of blood received in culture bottles   Culture   Final    NO GROWTH 3 DAYS Performed at [facility], [place] [address] [place], [place] [address]    Report Status PENDING  Incomplete  Blood culture (routine x 2)     Status: None (Preliminary result)   Collection Time: 10/26/2020  7:00 PM   Specimen: BLOOD  Result Value Ref Range Status   Specimen Description BLOOD LEFT ANTECUBITAL  Final   Special Requests   Final    BOTTLES DRAWN AEROBIC AND ANAEROBIC Blood Culture adequate volume   Culture   Final    NO GROWTH 3 DAYS Performed at [facility], [place] [address], [place], [place] [address]    Report Status PENDING  Incomplete  Culture, Respiratory w Gram Stain     Status: None (Preliminary result)   Collection Time: 10/04/20  8:24 AM   Specimen: Tracheal Aspirate; Respiratory  Result Value Ref Range Status   Specimen Description TRACHEAL ASPIRATE  Final   Special Requests NONE  Final   Gram Stain   Final    MODERATE WBC PRESENT,BOTH PMN AND MONONUCLEAR RARE SQUAMOUS EPITHELIAL CELLS PRESENT FEW GRAM POSITIVE RODS RARE BUDDING YEAST SEEN    Culture   Final    CULTURE REINCUBATED FOR BETTER GROWTH Performed at [facility], [place] [address], [place], [place] [address]    Report Status PENDING  Incomplete  MRSA PCR Screening     Status: None   Collection Time: 10/04/20  8:56 AM   Specimen: Nasopharyngeal  Result Value Ref Range Status   MRSA by PCR NEGATIVE NEGATIVE Final    Comment:        The GeneXpert MRSA Assay (FDA approved for NASAL specimens only), is one component of a comprehensive MRSA colonization surveillance program. It is not intended to diagnose MRSA infection nor to guide or monitor treatment for MRSA infections. Performed  at [facility], [place] [address] [place], [place] [address]   CSF culture w Gram Stain     Status: None (Preliminary result)   Collection Time: 10/04/20  5:01 PM   Specimen: PATH Cytology CSF; Cerebrospinal Fluid  Result Value Ref Range Status   Specimen Description CSF  Final   Special Requests NONE  Final   Gram Stain   Final    WBC PRESENT, PREDOMINANTLY MONONUCLEAR NO ORGANISMS SEEN CYTOSPIN SMEAR    Culture   Final    NO GROWTH 3 DAYS Performed at [facility], [place] [address] [place], [place] [address]    Report Status PENDING  Incomplete  Fungus Culture With Stain     Status: None (Preliminary result)   Collection Time: 10/04/20  5:01 PM   Specimen: PATH Cytology CSF; Cerebrospinal Fluid  Result Value Ref Range Status   Fungus Stain Final report  Final    Comment: (NOTE) Performed At: [facility] [place], [place] [id number] Rush Farmer MD [id number]    Fungus (Mycology) Culture PENDING  Incomplete   Fungal Source CSF  Final    Comment: Performed at [facility], [place] [address], [place], [place] [address]  Anaerobic culture w Gram Stain     Status: None (Preliminary result)   Collection Time: 10/04/20  5:01 PM   Specimen: PATH Cytology CSF; Cerebrospinal Fluid  Result Value Ref Range Status   Specimen Description CSF  Final   Special Requests NONE  Final   Gram Stain PENDING  Incomplete   Culture   Final    NO GROWTH 2 DAYS NO ANAEROBES ISOLATED; CULTURE IN PROGRESS FOR 5 DAYS Performed at [facility], [place] [address] [place], [place] [address]    Report Status PENDING  Incomplete  Fungus Culture Result     Status: None   Collection Time: 10/04/20  5:01 PM  Result Value Ref Range Status   Result 1 Comment  Final    Comment: (NOTE) KOH/Calcofluor preparation:  no fungus observed. Performed At: [facility] [place], [place] [id number] Rush Farmer MD [id number]   Culture, Urine     Status: None   Collection Time:  09/30/2020  5:30 AM   Specimen: Urine, Catheterized  Result Value Ref Range Status   Specimen Description URINE, CATHETERIZED  Final   Special Requests daptomycin, zosyn Normal  Final   Culture   Final    NO GROWTH Performed at [facility], [address], [place], [place] [address]    Report Status 10/07/2020 FINAL  Final  Aerobic/Anaerobic Culture w Gram Stain (surgical/deep wound)     Status: None (Preliminary result)   Collection Time: 10/25/2020  7:13 PM   Specimen: Soft Tissue, Other  Result Value Ref Range Status   Specimen Description TISSUE RIGHT TOE  Final   Special Requests SOFT TISSUE  Final   Gram Stain NO WBC SEEN NO ORGANISMS SEEN   Final   Culture   Final    NO GROWTH < 12 HOURS Performed at [facility], [place] [address], [place], [place] [address]    Report Status PENDING  Incomplete  Aerobic/Anaerobic Culture w Gram Stain (surgical/deep wound)     Status: None (Preliminary result)   Collection Time: 10/20/2020  7:15 PM   Specimen: Soft Tissue, Other  Result Value Ref Range Status   Specimen Description BONE RIGHT TOE  Final   Special  Requests RT GREAT TOE BONE  Final   Gram Stain NO WBC SEEN NO ORGANISMS SEEN   Final   Culture   Final    NO GROWTH < 12 HOURS Performed at [facility], [address], [place], [place] [address]    Report Status PENDING  Incomplete    Studies/Results: DG Chest Port 1 View  Result Date: 10/07/2020 CLINICAL DATA:  Respiratory failure EXAM: PORTABLE CHEST 1 VIEW COMPARISON:  10/12/2020 FINDINGS: Endotracheal tube is seen 3.7 cm above the carina on this rotated examination. Nasogastric tube extends into the abdomen. Bibasilar pulmonary infiltrates, left greater than right, are again seen and appears stable since prior exam. Small left pleural effusions suspected. No pneumothorax. Cardiac size within normal limits. IMPRESSION: Stable examination with unchanged support tubes and bibasilar pulmonary infiltrates, left  greater than right. Possible small left pleural effusion. Electronically Signed   By: Fidela Salisbury MD   On: 10/07/2020 07:01   DG Chest Port 1 View  Result Date: 10/09/2020 CLINICAL DATA:  Respiratory failure EXAM: PORTABLE CHEST 1 VIEW COMPARISON:  10/04/2020 FINDINGS: Endotracheal tube seen 4.5 cm above the carina. Nasogastric tube extends into the abdomen. Pulmonary insufflation has slightly diminished, but is still symmetric and within normal limits. Bibasilar pulmonary infiltrates have developed left greater than right. Small left pleural effusion has developed. No pneumothorax. Cardiac size within normal limits. No acute bone abnormality. IMPRESSION: Stable support lines and tubes. Interval development of bibasilar pulmonary infiltrates and small left pleural effusion. Slight interval decrease in pulmonary insufflation. Electronically Signed   By: Fidela Salisbury MD   On: 10/20/2020 04:37      Assessment/Plan:  INTERVAL HISTORY:  Patient is status post right toe amputation by Dr. March Rummage yesterday.  Principal Problem:   Altered mental status Active Problems:   Hypertension   Dementia (HCC)   Acute renal failure superimposed on stage 3b chronic kidney disease (HCC)   Sepsis ([place])   DKA (diabetic ketoacidosis) (HCC)   Ulcer of great toe, right, with unspecified severity ([place])   Unresponsive   Endotracheal tube present   Acute osteomyelitis of toe, right (HCC)    Tony [name] is a 70 y.o. male with admitted with a septic picture in the context of osteomyelitis of his right great toe.  His blood cultures have remained sterile and he is stabilized hemodynamically on daptomycin and Zosyn.  Yesterday he underwent amputation of his great toe which what sounds to be macroscopically good margins.  Osteomyelitis: We will follow-up intraoperative cultures and continue daptomycin and Zosyn for now.  I would want to give him at least 4 weeks of systemic IV antibiotics after his surgery to  take care of any microscopic infection  Pathology report may also be informative.  My partner Dr. West Bali is available this weekend for any questions and will follow up on culture data.  I will be back on Monday.   LOS: 4 days   Alcide Evener 10/07/2020, 10:50 AM

## 2020-10-07 NOTE — Progress Notes (Addendum)
NAME:  Tony [name], MRN:  [id number], DOB:  Aug 14, 1950, LOS: 4 ADMISSION DATE:  10/17/2020, CONSULTATION DATE: 3/8 REFERRING MD: Dr. Rosine Door, CHIEF COMPLAINT: Altered mental status  Brief History:  69 yo male admitted from [facility] on 3/07 with AMS, fever (Tm 101.7), tachycardia, lactic acidosis and hyperglycemia.  Required intubation for airway protection and transferred to ICU on 3/08.  Past Medical History:  Alzheimer's dementia, BPH, CKD 3a, DM type 2, HTN, Seizure disorder, CVA  Significant Hospital Events:  3/7 admitted for sepsis/DKA 3/8 unresponsive, intubated, transferred to ICU 3/10 Right great toe amputation  Consults:  Neurology  Procedures:  ETT 3/8 >  Significant Diagnostic Tests:   CT head 3/7 > no evidence of acute intracranial abnormality  X-ray right foot 3/7 > no gross findings suspicious of osteomyelitis  MRI right foot 3/8 > osteomyelitis of 1st distal phalanx and 1st proximal phalanx, plantar muscle edema concerning for myositis  CT head 3/8 > remote b/l cerebellar infarcts  CT abdomen pelvis 3/8 > bibasilar consolidation, heavy stool burden throughout colon, diverticulosis of descending and sigmoid colon  LP 3/8 > glucose 139, protein 113, RBC 48, WBC 6  EEG 3/8 > generalized slowing  EEG 3/10 > No evidence of epileptogenicity  Micro Data:  COVID/Flu 3/7 > negative MRSA PCR 3/7 > negative Blood culture 3/7 > Urine culture 3/7 > multiple species tracheal aspirate 3/8 > CSF 3/8 > Right bone/wound culture 3/10 >  Antimicrobials:  Cefepime 3/7 > 3/8 Vancomycin 3/7 > 3/8 Zosyn 3/8 > Daptomycin 3/8 >  Interim History / Subjective:  S/p right great toe amputation. On minimal vent settings. On fentanyl gtt. Drowsy, does not follow commands  Objective   Blood pressure 103/60, pulse (!) 117, temperature (!) 100.4 F (38 C), resp. rate 12, height 6' (1.829 m), weight 88.3 kg, SpO2 99 %.    Vent Mode: PRVC FiO2 (%):  [30 %] 30  % Set Rate:  [12 bmp] 12 bmp Vt Set:  [550 mL] 550 mL PEEP:  [5 cmH20] 5 cmH20 Pressure Support:  [5 cmH20-8 cmH20] 8 cmH20 Plateau Pressure:  [10 cmH20-18 cmH20] 18 cmH20   Intake/Output Summary (Last 24 hours) at 10/07/2020 0730 Last data filed at 10/07/2020 0600 Gross per 24 hour  Intake 1804.31 ml  Output 1370 ml  Net 434.31 ml   Filed Weights   10/05/20 0500 10/27/2020 0416 10/07/20 0430  Weight: 85.7 kg 85.7 kg 88.3 kg   Physical Exam: General: Sedated, no acute distress HENT: , AT, ETT in place Eyes: EOMI, no scleral icterus Respiratory: Clear to auscultation bilaterally.  No crackles, wheezing or rales Cardiovascular: Tachycardic, regular rhythm, -M/R/G, no JVD GI: BS+, soft, nontender Extremities: Trace pedal edema in LLE, RLE wrapped in ACE bandage, -tenderness Neuro: Drowsy, pupils equal and reactive bilaterally, eyes open, corneal and gag reflex intact, does not follow commands GU: Foley in place  Resolved Hospital Problem list   Hypernatremia, DKA  Assessment & Plan:   Acute hypoxic respiratory failure with compromised airway in setting of aspiration pneumonitis. - Full vent support - plan to wean sedation for RASS goal 0 and -1 followed by WUA/SBT - goal SpO2 > 92%  Sepsis from aspiration pneumonia and chronic osteomyelitis of Rt foot - improving S/p right great toe amputation 3/10 - day 5 of ABx, currently on daptomycin and zosyn per ID - post-op care per surgery  Acute metabolic encephalopathy from sepsis, hyperglycemia. Hx of seizures, Alzheimer dementia, CVA. - RASS  goal 0 to -1 - continue depakote - hold outpt aricept, paxil, namenda  DM type 2 poorly controlled with hyperglycemia. - SSI with tube feed coverage  - Levemir to 25 units BID  AKI from ATN in setting of sepsis. Improved. Fair UOP CKD 3a. Lactic acidosis. Hx of BPH. - baseline creatinine 1.3 - DC IV fluids - f/u BMET - monitor urine outpt - hold outpt flomax  Anemia of  critical illness and chronic disease. - f/u CBC - transfuse for Hb < 7 or significant bleeding  Hx of HTN. - hold outpt norvasc  Best practice (evaluated daily)  Diet: tube feeds DVT prophylaxis: Lovenox GI prophylaxis: PPI Glucose control: Endotool Mobility: Bedrest Disposition:ICU Code Status: FULL  Labs    CMP Latest Ref Rng & Units 10/07/2020 09/27/2020 10/05/2020  Glucose 70 - 99 mg/dL 225(H) 288(H) 252(H)  BUN 8 - 23 mg/dL 23 27(H) 29(H)  Creatinine 0.61 - 1.24 mg/dL 1.13 1.09 1.27(H)  Sodium 135 - 145 mmol/L 145 146(H) 146(H)  Potassium 3.5 - 5.1 mmol/L 3.8 3.5 3.6  Chloride 98 - 111 mmol/L 110 114(H) 112(H)  CO2 22 - 32 mmol/L 28 25 24   Calcium 8.9 - 10.3 mg/dL 9.8 10.2 9.9  Total Protein 6.5 - 8.1 g/dL - - -  Total Bilirubin 0.3 - 1.2 mg/dL - - -  Alkaline Phos 38 - 126 U/L - - -  AST 15 - 41 U/L - - -  ALT 0 - 44 U/L - - -    CBC Latest Ref Rng & Units 10/07/2020 10/05/2020 10/04/2020  WBC 4.0 - 10.5 K/uL 14.0(H) 16.0(H) 13.4(H)  Hemoglobin 13.0 - 17.0 g/dL 9.4(L) 10.5(L) 12.1(L)  Hematocrit 39.0 - 52.0 % 29.0(L) 33.2(L) 39.5  Platelets 150 - 400 K/uL 148(L) 151 208    ABG    Component Value Date/Time   PHART 7.529 (H) 10/04/2020 0923   PCO2ART 32.1 10/04/2020 0923   PO2ART 174 (H) 10/04/2020 0923   HCO3 26.4 10/04/2020 0923   TCO2 27 10/04/2020 0923   ACIDBASEDEF 3.7 (H) 02/07/2010 1730   O2SAT 100.0 10/04/2020 0923    CBG (last 3)  Recent Labs    10/20/2020 2014 10/05/2020 2336 10/07/20 0328  GLUCAP 100* 89 190*    Critical care time: 33 minutes   The patient is critically ill and requires high complexity decision making for assessment and support, frequent evaluation and titration of therapies, application of advanced monitoring technologies and extensive interpretation of multiple databases.    Rodman Pickle, M.D. [facility] Pulmonary/Critical Care Medicine 10/07/2020 7:30 AM   Please see Amion for pager number to reach on-call Pulmonary and Critical  Care Team.

## 2020-10-08 DIAGNOSIS — M869 Osteomyelitis, unspecified: Secondary | ICD-10-CM

## 2020-10-08 DIAGNOSIS — L97519 Non-pressure chronic ulcer of other part of right foot with unspecified severity: Secondary | ICD-10-CM | POA: Diagnosis not present

## 2020-10-08 LAB — BASIC METABOLIC PANEL
Anion gap: 8 (ref 5–15)
BUN: 23 mg/dL (ref 8–23)
CO2: 27 mmol/L (ref 22–32)
Calcium: 10.4 mg/dL — ABNORMAL HIGH (ref 8.9–10.3)
Chloride: 109 mmol/L (ref 98–111)
Creatinine, Ser: 1.11 mg/dL (ref 0.61–1.24)
GFR, Estimated: >60 mL/min (ref >60)
Glucose, Bld: 269 mg/dL — ABNORMAL HIGH (ref 70–99)
Potassium: 5.4 mmol/L — ABNORMAL HIGH (ref 3.5–5.1)
Sodium: 144 mmol/L (ref 135–145)

## 2020-10-08 LAB — GLUCOSE, CAPILLARY
Glucose-Capillary: 198 mg/dL — ABNORMAL HIGH (ref 70–99)
Glucose-Capillary: 230 mg/dL — ABNORMAL HIGH (ref 70–99)
Glucose-Capillary: 243 mg/dL — ABNORMAL HIGH (ref 70–99)
Glucose-Capillary: 246 mg/dL — ABNORMAL HIGH (ref 70–99)
Glucose-Capillary: 248 mg/dL — ABNORMAL HIGH (ref 70–99)
Glucose-Capillary: 248 mg/dL — ABNORMAL HIGH (ref 70–99)
Glucose-Capillary: 270 mg/dL — ABNORMAL HIGH (ref 70–99)

## 2020-10-08 LAB — CSF CULTURE W GRAM STAIN: Culture: NO GROWTH

## 2020-10-08 LAB — CULTURE, BLOOD (ROUTINE X 2)
Culture: NO GROWTH
Culture: NO GROWTH
Special Requests: ADEQUATE

## 2020-10-08 LAB — HSV(HERPES SMPLX VRS)ABS-I+II(IGG)-CSF: HSV Type I/II Ab, IgG CSF: 3.83 IV — ABNORMAL HIGH (ref <=0.89)

## 2020-10-08 MED ORDER — FUROSEMIDE 10 MG/ML IJ SOLN
40.0000 mg | Freq: Once | INTRAMUSCULAR | Status: AC
  Administered 2020-10-08: 40 mg via INTRAVENOUS
  Filled 2020-10-08: qty 4

## 2020-10-08 MED ORDER — INSULIN DETEMIR 100 UNIT/ML ~~LOC~~ SOLN
35.0000 [IU] | Freq: Two times a day (BID) | SUBCUTANEOUS | Status: DC
  Administered 2020-10-08: 35 [IU] via SUBCUTANEOUS
  Filled 2020-10-08 (×3): qty 0.35

## 2020-10-08 MED ORDER — INSULIN DETEMIR 100 UNIT/ML ~~LOC~~ SOLN
10.0000 [IU] | Freq: Once | SUBCUTANEOUS | Status: AC
  Administered 2020-10-08: 10 [IU] via SUBCUTANEOUS
  Filled 2020-10-08: qty 0.1

## 2020-10-08 MED ORDER — INSULIN ASPART 100 UNIT/ML ~~LOC~~ SOLN
0.0000 [IU] | SUBCUTANEOUS | Status: DC
  Administered 2020-10-08: 11 [IU] via SUBCUTANEOUS
  Administered 2020-10-08 (×2): 7 [IU] via SUBCUTANEOUS
  Administered 2020-10-09 – 2020-10-10 (×6): 4 [IU] via SUBCUTANEOUS
  Administered 2020-10-10: 3 [IU] via SUBCUTANEOUS
  Administered 2020-10-10: 4 [IU] via SUBCUTANEOUS
  Administered 2020-10-10: 3 [IU] via SUBCUTANEOUS
  Administered 2020-10-10 (×2): 4 [IU] via SUBCUTANEOUS
  Administered 2020-10-10: 7 [IU] via SUBCUTANEOUS
  Administered 2020-10-11: 3 [IU] via SUBCUTANEOUS
  Administered 2020-10-11 (×3): 4 [IU] via SUBCUTANEOUS
  Administered 2020-10-12: 7 [IU] via SUBCUTANEOUS
  Administered 2020-10-12: 11 [IU] via SUBCUTANEOUS
  Administered 2020-10-12 (×2): 4 [IU] via SUBCUTANEOUS
  Administered 2020-10-12 (×2): 7 [IU] via SUBCUTANEOUS
  Administered 2020-10-12 – 2020-10-13 (×3): 4 [IU] via SUBCUTANEOUS
  Administered 2020-10-13: 3 [IU] via SUBCUTANEOUS
  Administered 2020-10-13 (×3): 4 [IU] via SUBCUTANEOUS
  Administered 2020-10-14 (×2): 3 [IU] via SUBCUTANEOUS
  Administered 2020-10-14 – 2020-10-15 (×5): 4 [IU] via SUBCUTANEOUS
  Administered 2020-10-16: 3 [IU] via SUBCUTANEOUS
  Administered 2020-10-16: 7 [IU] via SUBCUTANEOUS
  Administered 2020-10-16: 4 [IU] via SUBCUTANEOUS
  Administered 2020-10-17: 3 [IU] via SUBCUTANEOUS
  Administered 2020-10-17 (×4): 4 [IU] via SUBCUTANEOUS
  Administered 2020-10-17 – 2020-10-18 (×4): 3 [IU] via SUBCUTANEOUS
  Administered 2020-10-18: 4 [IU] via SUBCUTANEOUS

## 2020-10-08 NOTE — Progress Notes (Signed)
Patient placed back on full support due to breath stacking.

## 2020-10-08 NOTE — Progress Notes (Signed)
Subjective:  Patient ID: LA DIBELLA, male    DOB: 05-Dec-1950,  MRN: [id number]  Remains intubated. Appears more alert today. Objective:   Vitals:   10/08/20 0900 10/08/20 1136  BP: 129/62 (!) 143/64  Pulse: (!) 109 (!) 107  Resp: 16 15  Temp: 99.32 F (37.4 C)   SpO2: 100% 100%   General AA&O x3. Normal mood and affect.  Vascular Leg and remaining digits warm to touch. Calf supple.  Neurologic Epicritic sensation grossly intact.  Dermatologic Dressing removed. Wound healing well, no periwound erythema noted. Scant sanguinous drainage. Skin taut, edematous.   Orthopedic: Responds to painful stimuli   Temp (24hrs), Avg:99.4 F (37.4 C), Min:96.8 F (36 C), Max:101.1 F (38.4 C)  Results for orders placed or performed during the hospital encounter of 10/05/2020  Urine culture     Status: Abnormal   Collection Time: 10/18/2020  2:42 PM   Specimen: Urine, Random  Result Value Ref Range Status   Specimen Description URINE, RANDOM  Final   Special Requests   Final    NONE Performed at [facility], [address] [place], [place] [address]    Culture MULTIPLE SPECIES PRESENT, SUGGEST RECOLLECTION (A)  Final   Report Status 10/05/2020 FINAL  Final  Resp Panel by RT-PCR (Flu A&B, Covid) Nasopharyngeal Swab     Status: None   Collection Time: 10/07/2020  2:55 PM   Specimen: Nasopharyngeal Swab; Nasopharyngeal(NP) swabs in vial transport medium  Result Value Ref Range Status   SARS Coronavirus 2 by RT PCR NEGATIVE NEGATIVE Final    Comment: (NOTE) SARS-CoV-2 target nucleic acids are NOT DETECTED.  The SARS-CoV-2 RNA is generally detectable in upper respiratory specimens during the acute phase of infection. The lowest concentration of SARS-CoV-2 viral copies this assay can detect is 138 copies/mL. A negative result does not preclude SARS-Cov-2 infection and should not be used as the sole basis for treatment or other patient management decisions. A negative result may occur  with  improper specimen collection/handling, submission of specimen other than nasopharyngeal swab, presence of viral mutation(s) within the areas targeted by this assay, and inadequate number of viral copies(<138 copies/mL). A negative result must be combined with clinical observations, patient history, and epidemiological information. The expected result is Negative.  Fact Sheet for Patients:  EntrepreneurPulse.com.au  Fact Sheet for Healthcare Providers:  IncredibleEmployment.be  This test is no t yet approved or cleared by the [place] FDA and  has been authorized for detection and/or diagnosis of SARS-CoV-2 by FDA under an Emergency Use Authorization (EUA). This EUA will remain  in effect (meaning this test can be used) for the duration of the COVID-19 declaration under Section 564(b)(1) of the Act, 21 U.S.C.section 360bbb-3(b)(1), unless the authorization is terminated  or revoked sooner.       Influenza A by PCR NEGATIVE NEGATIVE Final   Influenza B by PCR NEGATIVE NEGATIVE Final    Comment: (NOTE) The Xpert Xpress SARS-CoV-2/FLU/RSV plus assay is intended as an aid in the diagnosis of influenza from Nasopharyngeal swab specimens and should not be used as a sole basis for treatment. Nasal washings and aspirates are unacceptable for Xpert Xpress SARS-CoV-2/FLU/RSV testing.  Fact Sheet for Patients: EntrepreneurPulse.com.au  Fact Sheet for Healthcare Providers: IncredibleEmployment.be  This test is not yet approved or cleared by the [place] FDA and has been authorized for detection and/or diagnosis of SARS-CoV-2 by FDA under an Emergency Use Authorization (EUA). This EUA will remain in effect (meaning  this test can be used) for the duration of the COVID-19 declaration under Section 564(b)(1) of the Act, 21 U.S.C. section 360bbb-3(b)(1), unless the authorization is terminated  or revoked.  Performed at [facility], [place] [address] [place], [place] [address]   Blood culture (routine x 2)     Status: None (Preliminary result)   Collection Time: 10/02/2020  6:59 PM   Specimen: BLOOD  Result Value Ref Range Status   Specimen Description BLOOD SITE NOT SPECIFIED  Final   Special Requests   Final    BOTTLES DRAWN AEROBIC AND ANAEROBIC Blood Culture results may not be optimal due to an inadequate volume of blood received in culture bottles   Culture   Final    NO GROWTH 4 DAYS Performed at [facility], [place] [address], [place], [place] [address]    Report Status PENDING  Incomplete  Blood culture (routine x 2)     Status: None (Preliminary result)   Collection Time: 09/30/2020  7:00 PM   Specimen: BLOOD  Result Value Ref Range Status   Specimen Description BLOOD LEFT ANTECUBITAL  Final   Special Requests   Final    BOTTLES DRAWN AEROBIC AND ANAEROBIC Blood Culture adequate volume   Culture   Final    NO GROWTH 4 DAYS Performed at [facility], [place] [address], [place], [place] [address]    Report Status PENDING  Incomplete  Culture, Respiratory w Gram Stain     Status: None   Collection Time: 10/04/20  8:24 AM   Specimen: Tracheal Aspirate; Respiratory  Result Value Ref Range Status   Specimen Description TRACHEAL ASPIRATE  Final   Special Requests NONE  Final   Gram Stain   Final    MODERATE WBC PRESENT,BOTH PMN AND MONONUCLEAR RARE SQUAMOUS EPITHELIAL CELLS PRESENT FEW GRAM POSITIVE RODS RARE BUDDING YEAST SEEN    Culture   Final    FEW Normal respiratory flora-no Staph aureus or Pseudomonas seen Performed at [facility], [place] [address] [place], [place] [address]    Report Status 10/07/2020 FINAL  Final  MRSA PCR Screening     Status: None   Collection Time: 10/04/20  8:56 AM   Specimen: Nasopharyngeal  Result Value Ref Range Status   MRSA by PCR NEGATIVE NEGATIVE Final    Comment:        The GeneXpert MRSA Assay  (FDA approved for NASAL specimens only), is one component of a comprehensive MRSA colonization surveillance program. It is not intended to diagnose MRSA infection nor to guide or monitor treatment for MRSA infections. Performed at [facility], [place] [address] [place], [place] [address]   CSF culture w Gram Stain     Status: None   Collection Time: 10/04/20  5:01 PM   Specimen: PATH Cytology CSF; Cerebrospinal Fluid  Result Value Ref Range Status   Specimen Description CSF  Final   Special Requests NONE  Final   Gram Stain   Final    WBC PRESENT, PREDOMINANTLY MONONUCLEAR NO ORGANISMS SEEN CYTOSPIN SMEAR    Culture   Final    NO GROWTH 3 DAYS Performed at [facility], [place] [address] [place], [place] [address]    Report Status 10/08/2020 FINAL  Final  Fungus Culture With Stain     Status: None (Preliminary result)   Collection Time: 10/04/20  5:01 PM   Specimen: PATH Cytology CSF; Cerebrospinal Fluid  Result Value Ref  Range Status   Fungus Stain Final report  Final    Comment: (NOTE) Performed At: [facility] [place], [place] [id number] Rush Farmer MD [id number]    Fungus (Mycology) Culture PENDING  Incomplete   Fungal Source CSF  Final    Comment: Performed at [facility], [place] [address] [place], [place] [address]  Anaerobic culture w Gram Stain     Status: None (Preliminary result)   Collection Time: 10/04/20  5:01 PM   Specimen: PATH Cytology CSF; Cerebrospinal Fluid  Result Value Ref Range Status   Specimen Description CSF  Final   Special Requests NONE  Final   Culture   Final    NO GROWTH 3 DAYS NO ANAEROBES ISOLATED; CULTURE IN PROGRESS FOR 5 DAYS Performed at [facility], [place] [address], [place], [place] [address]    Report Status PENDING  Incomplete  Fungus Culture Result     Status: None   Collection Time: 10/04/20  5:01 PM  Result Value Ref Range Status   Result 1 Comment  Final    Comment:  (NOTE) KOH/Calcofluor preparation:  no fungus observed. Performed At: [facility] [place], [place] [id number] Rush Farmer MD CZ:[id number]   Culture, Urine     Status: None   Collection Time: 10/05/2020  5:30 AM   Specimen: Urine, Catheterized  Result Value Ref Range Status   Specimen Description URINE, CATHETERIZED  Final   Special Requests daptomycin, zosyn Normal  Final   Culture   Final    NO GROWTH Performed at [facility], [address] [place], [place] [address]    Report Status 10/07/2020 FINAL  Final  Aerobic/Anaerobic Culture w Gram Stain (surgical/deep wound)     Status: None (Preliminary result)   Collection Time: 10/18/2020  7:13 PM   Specimen: Soft Tissue, Other  Result Value Ref Range Status   Specimen Description TISSUE RIGHT TOE  Final   Special Requests SOFT TISSUE  Final   Gram Stain NO WBC SEEN NO ORGANISMS SEEN   Final   Culture   Final    NO GROWTH 2 DAYS Performed at [facility], [address] [place], [place] [address]    Report Status PENDING  Incomplete  Aerobic/Anaerobic Culture w Gram Stain (surgical/deep wound)     Status: None (Preliminary result)   Collection Time: 10/19/2020  7:15 PM   Specimen: Soft Tissue, Other  Result Value Ref Range Status   Specimen Description BONE RIGHT TOE  Final   Special Requests RT GREAT TOE BONE  Final   Gram Stain NO WBC SEEN NO ORGANISMS SEEN   Final   Culture   Final    NO GROWTH 2 DAYS Performed at [facility], [address], [place],  [address]    Report Status PENDING  Incomplete    Assessment & Plan:  Patient was evaluated and treated and all questions answered.  R Great Toe Osteomyelitis -Bone and ST cultures pending. NGTD. -Path pending -Dressing changed. Wound well appearing. Order placed for changes qOD. -Abx per ID. -Will continue to follow until cultures and path return. No further surgical intervention planned.  Evelina Bucy,  DPM  Accessible via secure chat for questions or concerns.

## 2020-10-08 NOTE — Progress Notes (Signed)
NAME:  Tony [name], MRN:  [id number], DOB:  [birth year], LOS: 5 ADMISSION DATE:  09/29/2020, CONSULTATION DATE: 3/8 REFERRING MD: Dr. Rosine Door, CHIEF COMPLAINT: Altered mental status  Brief History:  70 yo male admitted from [facility] on 3/07 with AMS, fever (Tm 101.7), tachycardia, lactic acidosis and hyperglycemia.  Required intubation for airway protection and transferred to ICU on 3/08.  Past Medical History:  Alzheimer's dementia, BPH, CKD 3a, DM type 2, HTN, Seizure disorder, CVA  Significant Hospital Events:  3/7 admitted for sepsis/DKA 3/8 unresponsive, intubated, transferred to ICU 3/10 Right great toe amputation 3/11 Tolerate SBT. Mental status precludes extubation. Cumulative fluid balance +  Consults:  Neurology  Procedures:  ETT 3/8 >  Significant Diagnostic Tests:   CT head 3/7 > no evidence of acute intracranial abnormality  X-ray right foot 3/7 > no gross findings suspicious of osteomyelitis  MRI right foot 3/8 > osteomyelitis of 1st distal phalanx and 1st proximal phalanx, plantar muscle edema concerning for myositis  CT head 3/8 > remote b/l cerebellar infarcts  CT abdomen pelvis 3/8 > bibasilar consolidation, heavy stool burden throughout colon, diverticulosis of descending and sigmoid colon  LP 3/8 > glucose 139, protein 113, RBC 48, WBC 6  EEG 3/8 > generalized slowing  EEG 3/10 > No evidence of epileptogenicity  Micro Data:  COVID/Flu 3/7 > negative MRSA PCR 3/7 > negative Blood culture 3/7 > Urine culture 3/7 > multiple species tracheal aspirate 3/8 > CSF 3/8 > Right bone/wound culture 3/10 >  Antimicrobials:  Cefepime 3/7 > 3/8 Vancomycin 3/7 > 3/8 Zosyn 3/8 > Daptomycin 3/8 >  Interim History / Subjective:  Tolerating PS. Unable to follow commands or cough.  Objective   Blood pressure 129/62, pulse (!) 109, temperature 99.32 F (37.4 C), resp. rate 16, height 6' (1.829 m), weight 88.1 kg, SpO2 100 %.    Vent Mode:  PSV;CPAP FiO2 (%):  [30 %] 30 % Set Rate:  [12 bmp] 12 bmp Vt Set:  [550 mL] 550 mL PEEP:  [5 cmH20] 5 cmH20 Pressure Support:  [5 cmH20] 5 cmH20 Plateau Pressure:  [12 cmH20-13 cmH20] 12 cmH20   Intake/Output Summary (Last 24 hours) at 10/08/2020 1134 Last data filed at 10/08/2020 1000 Gross per 24 hour  Intake 926.3 ml  Output 2000 ml  Net -1073.7 ml   Filed Weights   09/27/2020 0416 10/07/20 0430 10/08/20 0452  Weight: 85.7 kg 88.3 kg 88.1 kg   Physical Exam: General: Chronically ill-appearing, no acute distress HENT: [place], AT, ETT in place Eyes: EOMI, no scleral icterus Respiratory: Clear to auscultation bilaterally.  No crackles, wheezing or rales Cardiovascular: RRR, -M/R/G, no JVD GI: BS+, soft, nontender Extremities:-Edema,-tenderness Neuro: Eyes open, pupils equal and reactive bilaterally, corneal and gag intact, does not follow commands GU: Foley in place  Resolved Hospital Problem list   Hypernatremia, DKA  Assessment & Plan:   Acute hypoxic respiratory failure with compromised airway in setting of aspiration pneumonitis. - PS as tolerated - Extubation precluded by mental status. Will continue to monitor for improvement. Will also need to discuss [place] regarding extubation plan  - RASS goal 0 and -1 - goal SpO2 > 92%  Sepsis from aspiration pneumonia and chronic osteomyelitis of Rt foot - improving S/p right great toe amputation 3/10 - day 6 of ABx, currently on daptomycin and zosyn per ID - post-op care per surgery  Acute metabolic encephalopathy from sepsis, hyperglycemia. Hx of seizures, Alzheimer dementia, CVA. - RASS  goal 0 to -1 - continue depakote - hold outpt aricept, paxil, namenda  DM type 2 poorly controlled with hyperglycemia. - SSI with tube feed coverage  - Levemir to 25 units BID  AKI from ATN in setting of sepsis. Improved. Fair UOP CKD 3a. Lactic acidosis. Hx of BPH. - baseline creatinine 1.3 - f/u BMET - monitor urine outpt - hold  outpt flomax  Anemia of critical illness and chronic disease. - f/u CBC - transfuse for Hb < 7 or significant bleeding  Hx of HTN. - hold outpt norvasc  Best practice (evaluated daily)  Diet: tube feeds DVT prophylaxis: Lovenox GI prophylaxis: PPI Glucose control: Endotool Mobility: Bedrest Disposition:ICU Code Status: FULL  Labs    CMP Latest Ref Rng & Units 10/07/2020 09/30/2020 10/05/2020  Glucose 70 - 99 mg/dL 225(H) 288(H) 252(H)  BUN 8 - 23 mg/dL 23 27(H) 29(H)  Creatinine 0.61 - 1.24 mg/dL 1.13 1.09 1.27(H)  Sodium 135 - 145 mmol/L 145 146(H) 146(H)  Potassium 3.5 - 5.1 mmol/L 3.8 3.5 3.6  Chloride 98 - 111 mmol/L 110 114(H) 112(H)  CO2 22 - 32 mmol/L 28 25 24   Calcium 8.9 - 10.3 mg/dL 9.8 10.2 9.9  Total Protein 6.5 - 8.1 g/dL - - -  Total Bilirubin 0.3 - 1.2 mg/dL - - -  Alkaline Phos 38 - 126 U/L - - -  AST 15 - 41 U/L - - -  ALT 0 - 44 U/L - - -    CBC Latest Ref Rng & Units 10/07/2020 10/05/2020 10/04/2020  WBC 4.0 - 10.5 K/uL 14.0(H) 16.0(H) 13.4(H)  Hemoglobin 13.0 - 17.0 g/dL 9.4(L) 10.5(L) 12.1(L)  Hematocrit 39.0 - 52.0 % 29.0(L) 33.2(L) 39.5  Platelets 150 - 400 K/uL 148(L) 151 208    ABG    Component Value Date/Time   PHART 7.529 (H) 10/04/2020 0923   PCO2ART 32.1 10/04/2020 0923   PO2ART 174 (H) 10/04/2020 0923   HCO3 26.4 10/04/2020 0923   TCO2 27 10/04/2020 0923   ACIDBASEDEF 3.7 (H) 02/07/2010 1730   O2SAT 100.0 10/04/2020 0923    CBG (last 3)  Recent Labs    10/08/20 0006 10/08/20 0324 10/08/20 0809  GLUCAP 246* 198* 243*    Critical care time: 31 minutes   The patient is critically ill with multiple organ systems failure and requires high complexity decision making for assessment and support, frequent evaluation and titration of therapies, application of advanced monitoring technologies and extensive interpretation of multiple databases.  Independent Critical Care Time: 31 Minutes.   Rodman Pickle, M.D. [facility] Pulmonary/Critical  Care Medicine 10/08/2020 11:34 AM   Please see Amion for pager number to reach on-call Pulmonary and Critical Care Team.

## 2020-10-08 NOTE — Progress Notes (Signed)
Patient placed back on PS/CPAP 10/5 for better vent synchrony by Dr. Loanne Drilling.

## 2020-10-09 ENCOUNTER — Inpatient Hospital Stay (HOSPITAL_COMMUNITY): Payer: Medicare (Managed Care)

## 2020-10-09 DIAGNOSIS — M869 Osteomyelitis, unspecified: Secondary | ICD-10-CM | POA: Diagnosis not present

## 2020-10-09 LAB — BASIC METABOLIC PANEL
Anion gap: 8 (ref 5–15)
BUN: 26 mg/dL — ABNORMAL HIGH (ref 8–23)
CO2: 30 mmol/L (ref 22–32)
Calcium: 10.5 mg/dL — ABNORMAL HIGH (ref 8.9–10.3)
Chloride: 109 mmol/L (ref 98–111)
Creatinine, Ser: 1.07 mg/dL (ref 0.61–1.24)
GFR, Estimated: >60 mL/min (ref >60)
Glucose, Bld: 217 mg/dL — ABNORMAL HIGH (ref 70–99)
Potassium: 3.6 mmol/L (ref 3.5–5.1)
Sodium: 147 mmol/L — ABNORMAL HIGH (ref 135–145)

## 2020-10-09 LAB — GLUCOSE, CAPILLARY
Glucose-Capillary: 162 mg/dL — ABNORMAL HIGH (ref 70–99)
Glucose-Capillary: 192 mg/dL — ABNORMAL HIGH (ref 70–99)
Glucose-Capillary: 180 mg/dL — ABNORMAL HIGH (ref 70–99)
Glucose-Capillary: 167 mg/dL — ABNORMAL HIGH (ref 70–99)
Glucose-Capillary: 168 mg/dL — ABNORMAL HIGH (ref 70–99)
Glucose-Capillary: 163 mg/dL — ABNORMAL HIGH (ref 70–99)

## 2020-10-09 LAB — MAGNESIUM: Magnesium: 1.7 mg/dL (ref 1.7–2.4)

## 2020-10-09 LAB — CBC
HCT: 28.8 % — ABNORMAL LOW (ref 39.0–52.0)
Hemoglobin: 8.9 g/dL — ABNORMAL LOW (ref 13.0–17.0)
MCH: 30.9 pg (ref 26.0–34.0)
MCHC: 30.9 g/dL (ref 30.0–36.0)
MCV: 100 fL (ref 80.0–100.0)
Platelets: 167 10*3/uL (ref 150–400)
RBC: 2.88 MIL/uL — ABNORMAL LOW (ref 4.22–5.81)
RDW: 13.2 % (ref 11.5–15.5)
WBC: 12.4 10*3/uL — ABNORMAL HIGH (ref 4.0–10.5)
nRBC: 0.4 % — ABNORMAL HIGH (ref 0.0–0.2)

## 2020-10-09 LAB — ANAEROBIC CULTURE W GRAM STAIN

## 2020-10-09 MED ORDER — POTASSIUM CHLORIDE CRYS ER 20 MEQ PO TBCR: 40.0000 meq | EXTENDED_RELEASE_TABLET | Freq: Once | ORAL | Status: DC

## 2020-10-09 MED ORDER — INSULIN DETEMIR 100 UNIT/ML ~~LOC~~ SOLN
45.0000 [IU] | Freq: Two times a day (BID) | SUBCUTANEOUS | Status: DC
  Administered 2020-10-09 – 2020-10-13 (×9): 45 [IU] via SUBCUTANEOUS
  Filled 2020-10-09 (×10): qty 0.45

## 2020-10-09 MED ORDER — PAROXETINE HCL 10 MG PO TABS
10.0000 mg | ORAL_TABLET | Freq: Every day | ORAL | Status: DC
  Administered 2020-10-09 – 2020-10-15 (×7): 10 mg
  Filled 2020-10-09 (×7): qty 1

## 2020-10-09 MED ORDER — POTASSIUM CHLORIDE 20 MEQ PO PACK: 40.0000 meq | PACK | Freq: Once | ORAL | Status: DC

## 2020-10-09 MED ORDER — FUROSEMIDE 10 MG/ML IJ SOLN: 40.0000 mg | Freq: Once | INTRAMUSCULAR | Status: DC

## 2020-10-09 NOTE — Progress Notes (Signed)
Patient has reoccurring peak pressure alarms on the vent and is breath stacking.  RT and RN have suctioned the ETT multiple times during the day with either moderate or small amount of thick secretions obtained.  RT bag lavaged patient to ensure no mucus plugging. Minimal secretions obtained.  RN notified.

## 2020-10-09 NOTE — Progress Notes (Signed)
NAME:  Tony [name], MRN:  [id number], DOB:  01-Aug-1950, LOS: 6 ADMISSION DATE:  10/05/2020, CONSULTATION DATE: 3/8 REFERRING MD: Dr. Rosine Door, CHIEF COMPLAINT: Altered mental status  Brief History:  69 yo male admitted from [facility] on 3/07 with AMS, fever (Tm 101.7), tachycardia, lactic acidosis and hyperglycemia.  Required intubation for airway protection and transferred to ICU on 3/08.  Past Medical History:  Alzheimer's dementia, BPH, CKD 3a, DM type 2, HTN, Seizure disorder, CVA  Significant Hospital Events:  3/7 admitted for sepsis/DKA 3/8 unresponsive, intubated, transferred to ICU 3/10 Right great toe amputation 3/11 Tolerate SBT. Mental status precludes extubation. Cumulative fluid balance + 3/12 Weaned off sedation. Poor mental status. Tolerating SBT 3/13 Fever  Consults:  Neurology  Procedures:  ETT 3/8 >  Significant Diagnostic Tests:   CT head 3/7 > no evidence of acute intracranial abnormality  X-ray right foot 3/7 > no gross findings suspicious of osteomyelitis  MRI right foot 3/8 > osteomyelitis of 1st distal phalanx and 1st proximal phalanx, plantar muscle edema concerning for myositis  CT head 3/8 > remote b/l cerebellar infarcts  CT abdomen pelvis 3/8 > bibasilar consolidation, heavy stool burden throughout colon, diverticulosis of descending and sigmoid colon  LP 3/8 > glucose 139, protein 113, RBC 48, WBC 6  EEG 3/8 > generalized slowing  EEG 3/10 > No evidence of epileptogenicity  Micro Data:  COVID/Flu 3/7 > negative MRSA PCR 3/7 > negative Blood culture 3/7 > Urine culture 3/7 > multiple species tracheal aspirate 3/8 > CSF 3/8 > Right bone/wound culture 3/10 >  Antimicrobials:  Cefepime 3/7 > 3/8 Vancomycin 3/7 > 3/8 Zosyn 3/8 > Daptomycin 3/8 >  Interim History / Subjective:  Tolerated PS yesterday. This am fever. Remains minimally responsive off sedation. However overnight reported did become agitated and requiring  fentanyl gtt.   Objective   Blood pressure (!) 92/55, pulse (!) 107, temperature (!) 101.8 F (38.8 C), temperature source Axillary, resp. rate 15, height 6' (1.829 m), weight 85.5 kg, SpO2 93 %.    Vent Mode: PRVC FiO2 (%):  [30 %] 30 % Set Rate:  [12 bmp] 12 bmp Vt Set:  [550 mL] 550 mL PEEP:  [5 cmH20] 5 cmH20 Pressure Support:  [5 cmH20-10 cmH20] 10 cmH20 Plateau Pressure:  [14 cmH20-17 cmH20] 16 cmH20   Intake/Output Summary (Last 24 hours) at 10/09/2020 0857 Last data filed at 10/09/2020 0500 Gross per 24 hour  Intake 1774.68 ml  Output 3640 ml  Net -1865.32 ml   Filed Weights   10/07/20 0430 10/08/20 0452 10/09/20 0500  Weight: 88.3 kg 88.1 kg 85.5 kg   Physical Exam: General: Chronically-ill appearing, no acute distress HENT: [place], AT, OP clear, MMM Eyes: EOMI, no scleral icterus Respiratory: Clear to auscultation bilaterally.  No crackles, wheezing or rales Cardiovascular: RRR, -M/R/G, no JVD GI: BS+, soft, nontender Extremities:-Edema,-tenderness Neuro: Eyes open, pupils equal and reactive bilaterally, corneal and gag intact, does not follow commands   Resolved Hospital Problem list   Hypernatremia, DKA  Assessment & Plan:   Acute hypoxic respiratory failure with compromised airway in setting of aspiration pneumonitis. - PS as tolerated - Extubation precluded by mental status. Will continue to monitor for improvement after discontinuation of fentanyl gtt. Discussed with [name], his brother. Does not wish to pursue tracheostomy. - RASS goal 0 and -1 - goal SpO2 > 92%  Sepsis from aspiration pneumonia and chronic osteomyelitis of Rt foot - improving S/p right great toe  amputation 3/10 - day 7 of ABx, currently on daptomycin and zosyn per ID - CXR, blood cultures - monitor fever/WBC curve - post-op care per surgery  Acute metabolic encephalopathy from sepsis, hyperglycemia. Hx of seizures, Alzheimer dementia, CVA. - RASS goal 0 to -1 - continue depakote -  hold outpt aricept, paxil, namenda  DM type 2 poorly controlled with hyperglycemia. - SSI with tube feed coverage  - Levemir to 45 units BID  AKI from ATN in setting of sepsis. Improved. Fair UOP CKD 3a. Lactic acidosis. Hx of BPH. - baseline creatinine 1.3 - f/u BMET - monitor urine outpt - hold outpt flomax  Anemia of critical illness and chronic disease. - f/u CBC - transfuse for Hb < 7 or significant bleeding  Hx of HTN. - hold outpt norvasc  Best practice (evaluated daily)  Diet: tube feeds DVT prophylaxis: Lovenox GI prophylaxis: PPI Glucose control: Endotool Mobility: Bedrest Disposition:ICU Code Status: FULL  Labs    CMP Latest Ref Rng & Units 10/09/2020 10/08/2020 10/07/2020  Glucose 70 - 99 mg/dL 217(H) 269(H) 225(H)  BUN 8 - 23 mg/dL 26(H) 23 23  Creatinine 0.61 - 1.24 mg/dL 1.07 1.11 1.13  Sodium 135 - 145 mmol/L 147(H) 144 145  Potassium 3.5 - 5.1 mmol/L 3.6 5.4(H) 3.8  Chloride 98 - 111 mmol/L 109 109 110  CO2 22 - 32 mmol/L 30 27 28   Calcium 8.9 - 10.3 mg/dL 10.5(H) 10.4(H) 9.8  Total Protein 6.5 - 8.1 g/dL - - -  Total Bilirubin 0.3 - 1.2 mg/dL - - -  Alkaline Phos 38 - 126 U/L - - -  AST 15 - 41 U/L - - -  ALT 0 - 44 U/L - - -    CBC Latest Ref Rng & Units 10/09/2020 10/07/2020 10/05/2020  WBC 4.0 - 10.5 K/uL 12.4(H) 14.0(H) 16.0(H)  Hemoglobin 13.0 - 17.0 g/dL 8.9(L) 9.4(L) 10.5(L)  Hematocrit 39.0 - 52.0 % 28.8(L) 29.0(L) 33.2(L)  Platelets 150 - 400 K/uL 167 148(L) 151    ABG    Component Value Date/Time   PHART 7.529 (H) 10/04/2020 0923   PCO2ART 32.1 10/04/2020 0923   PO2ART 174 (H) 10/04/2020 0923   HCO3 26.4 10/04/2020 0923   TCO2 27 10/04/2020 0923   ACIDBASEDEF 3.7 (H) 02/07/2010 1730   O2SAT 100.0 10/04/2020 0923    CBG (last 3)  Recent Labs    10/08/20 2326 10/09/20 0358 10/09/20 0809  GLUCAP 230* 162* 192*    Critical care time: 31 minutes   The patient is critically ill with multiple organ systems failure and  requires high complexity decision making for assessment and support, frequent evaluation and titration of therapies, application of advanced monitoring technologies and extensive interpretation of multiple databases.  Independent Critical Care Time: 31 Minutes.   Rodman Pickle, M.D. [facility] Pulmonary/Critical Care Medicine 10/09/2020 8:57 AM   Please see Amion for pager number to reach on-call Pulmonary and Critical Care Team.

## 2020-10-10 ENCOUNTER — Inpatient Hospital Stay: Payer: Self-pay

## 2020-10-10 DIAGNOSIS — M869 Osteomyelitis, unspecified: Secondary | ICD-10-CM | POA: Diagnosis not present

## 2020-10-10 DIAGNOSIS — R401 Stupor: Secondary | ICD-10-CM | POA: Diagnosis not present

## 2020-10-10 DIAGNOSIS — A419 Sepsis, unspecified organism: Secondary | ICD-10-CM | POA: Diagnosis not present

## 2020-10-10 DIAGNOSIS — M86171 Other acute osteomyelitis, right ankle and foot: Secondary | ICD-10-CM | POA: Diagnosis not present

## 2020-10-10 DIAGNOSIS — R4182 Altered mental status, unspecified: Secondary | ICD-10-CM | POA: Diagnosis not present

## 2020-10-10 DIAGNOSIS — G309 Alzheimer's disease, unspecified: Secondary | ICD-10-CM | POA: Diagnosis not present

## 2020-10-10 DIAGNOSIS — N179 Acute kidney failure, unspecified: Secondary | ICD-10-CM | POA: Diagnosis not present

## 2020-10-10 LAB — CBC
HCT: 29.8 % — ABNORMAL LOW (ref 39.0–52.0)
Hemoglobin: 9.2 g/dL — ABNORMAL LOW (ref 13.0–17.0)
MCH: 31.1 pg (ref 26.0–34.0)
MCHC: 30.9 g/dL (ref 30.0–36.0)
MCV: 100.7 fL — ABNORMAL HIGH (ref 80.0–100.0)
Platelets: 322 10*3/uL (ref 150–400)
RBC: 2.96 MIL/uL — ABNORMAL LOW (ref 4.22–5.81)
RDW: 13.3 % (ref 11.5–15.5)
WBC: 18.5 10*3/uL — ABNORMAL HIGH (ref 4.0–10.5)
nRBC: 0.2 % (ref 0.0–0.2)

## 2020-10-10 LAB — GLUCOSE, CAPILLARY
Glucose-Capillary: 146 mg/dL — ABNORMAL HIGH (ref 70–99)
Glucose-Capillary: 148 mg/dL — ABNORMAL HIGH (ref 70–99)
Glucose-Capillary: 156 mg/dL — ABNORMAL HIGH (ref 70–99)
Glucose-Capillary: 161 mg/dL — ABNORMAL HIGH (ref 70–99)
Glucose-Capillary: 162 mg/dL — ABNORMAL HIGH (ref 70–99)
Glucose-Capillary: 201 mg/dL — ABNORMAL HIGH (ref 70–99)

## 2020-10-10 LAB — BASIC METABOLIC PANEL
Anion gap: 7 (ref 5–15)
BUN: 31 mg/dL — ABNORMAL HIGH (ref 8–23)
CO2: 33 mmol/L — ABNORMAL HIGH (ref 22–32)
Calcium: 11.3 mg/dL — ABNORMAL HIGH (ref 8.9–10.3)
Chloride: 105 mmol/L (ref 98–111)
Creatinine, Ser: 1.05 mg/dL (ref 0.61–1.24)
GFR, Estimated: >60 mL/min (ref >60)
Glucose, Bld: 214 mg/dL — ABNORMAL HIGH (ref 70–99)
Potassium: 3.3 mmol/L — ABNORMAL LOW (ref 3.5–5.1)
Sodium: 145 mmol/L (ref 135–145)

## 2020-10-10 LAB — SURGICAL PATHOLOGY

## 2020-10-10 LAB — VALPROIC ACID LEVEL: Valproic Acid Lvl: 32 ug/mL — ABNORMAL LOW (ref 50.0–100.0)

## 2020-10-10 MED ORDER — FUROSEMIDE 10 MG/ML IJ SOLN
40.0000 mg | Freq: Once | INTRAMUSCULAR | Status: AC
  Administered 2020-10-10: 40 mg via INTRAVENOUS
  Filled 2020-10-10: qty 4

## 2020-10-10 MED ORDER — SODIUM CHLORIDE 0.9% FLUSH: 10.0000 mL | INTRAVENOUS | Status: DC | PRN

## 2020-10-10 MED ORDER — FREE WATER
200.0000 mL | Freq: Four times a day (QID) | Status: DC
  Administered 2020-10-10 – 2020-10-11 (×4): 200 mL

## 2020-10-10 MED ORDER — SODIUM CHLORIDE 0.9% FLUSH
10.0000 mL | Freq: Two times a day (BID) | INTRAVENOUS | Status: DC
  Administered 2020-10-10 – 2020-10-20 (×16): 10 mL

## 2020-10-10 NOTE — Progress Notes (Signed)
NAME:  Tony [name], MRN:  [id number], DOB:  02-Aug-1950, LOS: 7 ADMISSION DATE:  10/04/2020, CONSULTATION DATE: 3/8 REFERRING MD: Dr. Rosine Door, CHIEF COMPLAINT: Altered mental status  Brief History:  70 yo male admitted from [facility] on 3/07 with AMS, fever (Tm 101.7), tachycardia, lactic acidosis and hyperglycemia.  Required intubation for airway protection and transferred to ICU on 3/08.  Past Medical History:  Alzheimer's dementia, BPH, CKD 3a, DM type 2, HTN, Seizure disorder, CVA  Significant Hospital Events:  3/7 admitted for sepsis/DKA 3/8 unresponsive, intubated, transferred to ICU 3/10 Right great toe amputation 3/11 Tolerate SBT. Mental status precludes extubation. Cumulative fluid balance + 3/12 Weaned off sedation. Poor mental status. Tolerating SBT 3/13 Fever  Consults:  Neurology  Procedures:  ETT 3/8 >  Significant Diagnostic Tests:   CT head 3/7 > no evidence of acute intracranial abnormality  X-ray right foot 3/7 > no gross findings suspicious of osteomyelitis  MRI right foot 3/8 > osteomyelitis of 1st distal phalanx and 1st proximal phalanx, plantar muscle edema concerning for myositis  CT head 3/8 > remote b/l cerebellar infarcts  CT abdomen pelvis 3/8 > bibasilar consolidation, heavy stool burden throughout colon, diverticulosis of descending and sigmoid colon  LP 3/8 > glucose 139, protein 113, RBC 48, WBC 6  EEG 3/8 > generalized slowing  EEG 3/10 > No evidence of epileptogenicity  Micro Data:  COVID/Flu 3/7 > negative MRSA PCR 3/7 > negative Blood culture 3/7 > Urine culture 3/7 > multiple species tracheal aspirate 3/8 > CSF 3/8 > negative Right bone/wound culture 3/10 > Blood 3/13   Antimicrobials:  Cefepime 3/7 > 3/8 Vancomycin 3/7 > 3/8 Zosyn 3/8 > Daptomycin 3/8 >  Interim History / Subjective:   Has had some episodes of high peak pressures.  Fentanyl off, Precedex weaned off this morning 0.30, PEEP 5 Sodium up to  147 I/O+ 11.8 L total, urine output 877 cc / 24 hours Unable to get his labs this am  Objective   Blood pressure 105/70, pulse (!) 106, temperature 99.8 F (37.7 C), temperature source Oral, resp. rate (!) 25, height 6' (1.829 m), weight 85.6 kg, SpO2 98 %.    Vent Mode: PSV;CPAP FiO2 (%):  [30 %] 30 % Set Rate:  [12 bmp] 12 bmp Vt Set:  [550 mL] 550 mL PEEP:  [5 cmH20] 5 cmH20 Pressure Support:  [12 cmH20] 12 cmH20 Plateau Pressure:  [14 cmH20-24 cmH20] 14 cmH20   Intake/Output Summary (Last 24 hours) at 10/10/2020 0910 Last data filed at 10/10/2020 0800 Gross per 24 hour  Intake 1622.8 ml  Output 897 ml  Net 725.8 ml   Filed Weights   10/08/20 0452 10/09/20 0500 10/10/20 0410  Weight: 88.1 kg 85.5 kg 85.6 kg   Physical Exam: General: Ill-appearing man, ventilated, no distress HENT: ET tube in place, pupils equal Respiratory: Soft inspiratory wheeze, no expiratory wheeze, distant, no crackles Cardiovascular: Regular, distant, no murmur GI: Nondistended, positive bowel sounds Extremities: 1-2+ lower extremity edema Neuro: Opens eyes to voice, nodded to questions, attempted to stick out tongue on command but would not wiggle his toes on command   Resolved Hospital Problem list   Hypernatremia, DKA  Assessment & Plan:   Acute hypoxic respiratory failure with compromised airway in setting of aspiration pneumonitis. -Continue to push for pressure support.  Note previous discussions with patient brother [name] given his baseline quality of life he would not want tracheostomy.  We will try to optimize  respiratory status and discuss a possible extubation for success with no plans for reintubation. -Assess daily for possible diuresis based on volume status, chest x-ray, renal function -VAP prevention orders -Pulmonary hygiene -RASS goal 0 to -1   Sepsis from aspiration pneumonia and chronic osteomyelitis of Rt foot - improving S/p right great toe amputation 3/10 -Day 8 total  antibiotics, currently daptomycin and Zosyn.  Appreciate ID input and assistance -Continue to follow blood cultures, repeated 3/13 -Follow chest x-ray -Postop wound care great toe post amputation -Place PICC 3/14 for labs and IV meds  Acute metabolic encephalopathy from sepsis, hyperglycemia. Hx of seizures, Alzheimer dementia, CVA. -Mental status improving, continue to minimize sedating medication -Continue Depakote -Continue home Aricept, Namenda and Paxil (restarted 3/11, 3/13)  DM type 2 poorly controlled with hyperglycemia. -Levemir 45 units twice daily -Sliding scale insulin with tube feeding coverage 7 units every 4 hours  AKI from ATN in setting of sepsis. Improved. Fair UOP CKD 3a. Lactic acidosis. Hx of BPH. -Monitor daily for possible reinitiation diuretics, dose daily -Follow urine output, BMP -Outpatient Flomax is on hold  Hyponatremia -Follow BMP -Add low-dose free water 3/14  Anemia of critical illness and chronic disease. -Follow CBC -Hemoglobin transfusion threshold 7.0  Hx of HTN. -Continue to hold outpatient amlodipine  Best practice (evaluated daily)  Diet: tube feeds DVT prophylaxis: Lovenox GI prophylaxis: PPI Glucose control: Endotool Mobility: Bedrest Disposition:ICU Code Status: FULL Family: Discussed status with patient's brother [name] on 3/14  Labs    CMP Latest Ref Rng & Units 10/09/2020 10/08/2020 10/07/2020  Glucose 70 - 99 mg/dL 217(H) 269(H) 225(H)  BUN 8 - 23 mg/dL 26(H) 23 23  Creatinine 0.61 - 1.24 mg/dL 1.07 1.11 1.13  Sodium 135 - 145 mmol/L 147(H) 144 145  Potassium 3.5 - 5.1 mmol/L 3.6 5.4(H) 3.8  Chloride 98 - 111 mmol/L 109 109 110  CO2 22 - 32 mmol/L 30 27 28   Calcium 8.9 - 10.3 mg/dL 10.5(H) 10.4(H) 9.8  Total Protein 6.5 - 8.1 g/dL - - -  Total Bilirubin 0.3 - 1.2 mg/dL - - -  Alkaline Phos 38 - 126 U/L - - -  AST 15 - 41 U/L - - -  ALT 0 - 44 U/L - - -    CBC Latest Ref Rng & Units 10/09/2020 10/07/2020 10/05/2020   WBC 4.0 - 10.5 K/uL 12.4(H) 14.0(H) 16.0(H)  Hemoglobin 13.0 - 17.0 g/dL 8.9(L) 9.4(L) 10.5(L)  Hematocrit 39.0 - 52.0 % 28.8(L) 29.0(L) 33.2(L)  Platelets 150 - 400 K/uL 167 148(L) 151    ABG    Component Value Date/Time   PHART 7.529 (H) 10/04/2020 0923   PCO2ART 32.1 10/04/2020 0923   PO2ART 174 (H) 10/04/2020 0923   HCO3 26.4 10/04/2020 0923   TCO2 27 10/04/2020 0923   ACIDBASEDEF 3.7 (H) 02/07/2010 1730   O2SAT 100.0 10/04/2020 0923    CBG (last 3)  Recent Labs    10/09/20 2329 10/10/20 0358 10/10/20 0747  GLUCAP 163* 201* 162*    Critical care time: 32 minutes   The patient is critically ill with multiple organ systems failure and requires high complexity decision making for assessment and support, frequent evaluation and titration of therapies, application of advanced monitoring technologies and extensive interpretation of multiple databases.  Independent Critical Care Time: 32 Minutes.   Baltazar Apo, MD, PhD 10/10/2020, 9:25 AM [facility] [phone] or if no answer before 7:00PM call [phone] For any issues after 7:00PM please call eLink  [phone]         

## 2020-10-10 NOTE — Progress Notes (Signed)
Peripherally Inserted Central Catheter Placement  The IV Nurse has discussed with the patient and/or persons authorized to consent for the patient, the purpose of this procedure and the potential benefits and risks involved with this procedure.  The benefits include less needle sticks, lab draws from the catheter, and the patient may be discharged home with the catheter. Risks include, but not limited to, infection, bleeding, blood clot (thrombus formation), and puncture of an artery; nerve damage and irregular heartbeat and possibility to perform a PICC exchange if needed/ordered by physician.  Alternatives to this procedure were also discussed.  Bard Power PICC patient education guide, fact sheet on infection prevention and patient information card has been provided to patient /or left at bedside.    PICC Placement Documentation  PICC Single Lumen 10/10/20 PICC Brachial 39 cm 0 cm (Active)  Indication for Insertion or Continuance of Line Prolonged intravenous therapies 10/10/20 1643  Exposed Catheter (cm) 0 cm 10/10/20 1643  Site Assessment Clean;Dry;Intact 10/10/20 1643  Line Status Flushed;Blood return noted;Saline locked 10/10/20 1643  Dressing Type Transparent 10/10/20 1643  Dressing Status Clean;Dry;Intact 10/10/20 1643  Antimicrobial disc in place? Yes 10/10/20 1643  Dressing Change Due 10/17/20 10/10/20 1643       Scotty Court 10/10/2020, 4:46 PM

## 2020-10-10 NOTE — Progress Notes (Signed)
Subjective: Intubated   Antibiotics:  Anti-infectives (From admission, onward)   Start     Dose/Rate Route Frequency Ordered Stop   10/04/20 1530  ceFEPIme (MAXIPIME) 2 g in sodium chloride 0.9 % 100 mL IVPB  Status:  Discontinued        2 g 200 mL/hr over 30 Minutes Intravenous Every 24 hours 10/20/2020 1615 10/04/20 0905   10/04/20 1130  DAPTOmycin (CUBICIN) 750 mg in sodium chloride 0.9 % IVPB        750 mg 230 mL/hr over 30 Minutes Intravenous Daily 10/04/20 1042     10/04/20 1000  piperacillin-tazobactam (ZOSYN) IVPB 3.375 g        3.375 g 12.5 mL/hr over 240 Minutes Intravenous Every 8 hours 10/04/20 0920     10/13/2020 1615  vancomycin variable dose per unstable renal function (pharmacist dosing)  Status:  Discontinued         Does not apply See admin instructions 10/11/2020 1615 10/04/20 1042   10/13/2020 1515  ceFEPIme (MAXIPIME) 2 g in sodium chloride 0.9 % 100 mL IVPB        2 g 200 mL/hr over 30 Minutes Intravenous  Once 10/09/2020 1505 10/08/2020 1627   10/24/2020 1515  metroNIDAZOLE (FLAGYL) IVPB 500 mg        500 mg 100 mL/hr over 60 Minutes Intravenous  Once 10/13/2020 1505 10/02/2020 1645   10/04/2020 1515  vancomycin (VANCOREADY) IVPB 1750 mg/350 mL        1,750 mg 175 mL/hr over 120 Minutes Intravenous  Once 10/11/2020 1505 10/12/2020 1857      Medications: Scheduled Meds: . chlorhexidine gluconate (MEDLINE KIT)  15 mL Mouth Rinse BID  . Chlorhexidine Gluconate Cloth  6 each Topical Daily  . docusate  100 mg Per Tube BID  . donepezil  10 mg Per Tube QHS  . enoxaparin (LOVENOX) injection  40 mg Subcutaneous Q24H  . feeding supplement (PROSource TF)  45 mL Per Tube QID  . free water  200 mL Per Tube Q6H  . insulin aspart  0-20 Units Subcutaneous Q4H  . insulin aspart  7 Units Subcutaneous Q4H  . insulin detemir  45 Units Subcutaneous BID  . mouth rinse  15 mL Mouth Rinse 10 times per day  . memantine  10 mg Per Tube BID  . multivitamin with minerals  1 tablet Per  Tube Daily  . mupirocin ointment  1 application Topical BID  . pantoprazole sodium  40 mg Per Tube Q24H  . PARoxetine  10 mg Per Tube Daily  . polyethylene glycol  17 g Per Tube Daily  . thiamine  100 mg Per Tube Daily  . valproic acid  500 mg Per Tube Q8H   Continuous Infusions: . sodium chloride Stopped (10/26/2020 0609)  . DAPTOmycin (CUBICIN)  IV Stopped (10/09/20 2252)  . feeding supplement (VITAL 1.5 CAL) 1,000 mL (10/09/20 2146)  . piperacillin-tazobactam (ZOSYN)  IV 3.375 g (10/10/20 1037)   PRN Meds:.sodium chloride, acetaminophen (TYLENOL) oral liquid 160 mg/5 mL **OR** acetaminophen, dextrose, fentaNYL (SUBLIMAZE) injection, midazolam, ondansetron **OR** ondansetron (ZOFRAN) IV    Objective: Weight change: 0.1 kg  Intake/Output Summary (Last 24 hours) at 10/10/2020 1137 Last data filed at 10/10/2020 1000 Gross per 24 hour  Intake 1547.8 ml  Output 1197 ml  Net 350.8 ml   Blood pressure 125/63, pulse (!) 112, temperature 99.8 F (37.7 C), temperature source Oral, resp. rate 20, height 6' (1.829 m), weight 85.6  kg, SpO2 100 %. Temp:  [98.9 F (37.2 C)-102.4 F (39.1 C)] 99.8 F (37.7 C) (03/14 0700) Pulse Rate:  [90-130] 112 (03/14 1000) Resp:  [12-25] 20 (03/14 1000) BP: (99-182)/(48-170) 125/63 (03/14 0900) SpO2:  [98 %-100 %] 100 % (03/14 1000) FiO2 (%):  [30 %] 30 % (03/14 0827) Weight:  [85.6 kg] 85.6 kg (03/14 0410)  Physical Exam: Physical Exam Constitutional:      Interventions: He is intubated.  HENT:     Head: Normocephalic.  Cardiovascular:     Rate and Rhythm: Tachycardia present.  Pulmonary:     Effort: He is intubated.  Abdominal:     General: There is no distension.  Skin:    General: Skin is warm and dry.  Neurological:     General: No focal deficit present.     Foot bandaged at site of amputation  10/10/2020:      CBC:    BMET Recent Labs    10/08/20 1153 10/09/20 0448  NA 144 147*  K 5.4* 3.6  CL 109 109  CO2 27 30   GLUCOSE 269* 217*  BUN 23 26*  CREATININE 1.11 1.07  CALCIUM 10.4* 10.5*     Liver Panel  No results for input(s): PROT, ALBUMIN, AST, ALT, ALKPHOS, BILITOT, BILIDIR, IBILI in the last 72 hours.     Sedimentation Rate No results for input(s): ESRSEDRATE in the last 72 hours. C-Reactive Protein No results for input(s): CRP in the last 72 hours.  Micro Results: Recent Results (from the past 720 hour(s))  Urine culture     Status: Abnormal   Collection Time: 10/25/2020  2:42 PM   Specimen: Urine, Random  Result Value Ref Range Status   Specimen Description URINE, RANDOM  Final   Special Requests   Final    NONE Performed at [facility], [address] [place], [place] [address]    Culture MULTIPLE SPECIES PRESENT, SUGGEST RECOLLECTION (A)  Final   Report Status 10/05/2020 FINAL  Final  Resp Panel by RT-PCR (Flu A&B, Covid) Nasopharyngeal Swab     Status: None   Collection Time: 09/28/2020  2:55 PM   Specimen: Nasopharyngeal Swab; Nasopharyngeal(NP) swabs in vial transport medium  Result Value Ref Range Status   SARS Coronavirus 2 by RT PCR NEGATIVE NEGATIVE Final    Comment: (NOTE) SARS-CoV-2 target nucleic acids are NOT DETECTED.  The SARS-CoV-2 RNA is generally detectable in upper respiratory specimens during the acute phase of infection. The lowest concentration of SARS-CoV-2 viral copies this assay can detect is 138 copies/mL. A negative result does not preclude SARS-Cov-2 infection and should not be used as the sole basis for treatment or other patient management decisions. A negative result may occur with  improper specimen collection/handling, submission of specimen other than nasopharyngeal swab, presence of viral mutation(s) within the areas targeted by this assay, and inadequate number of viral copies(<138 copies/mL). A negative result must be combined with clinical observations, patient history, and epidemiological information. The expected result is  Negative.  Fact Sheet for Patients:  EntrepreneurPulse.com.au  Fact Sheet for Healthcare Providers:  IncredibleEmployment.be  This test is no t yet approved or cleared by the [place] FDA and  has been authorized for detection and/or diagnosis of SARS-CoV-2 by FDA under an Emergency Use Authorization (EUA). This EUA will remain  in effect (meaning this test can be used) for the duration of the COVID-19 declaration under Section 564(b)(1) of the Act, 21 U.S.C.section 360bbb-3(b)(1), unless the  authorization is terminated  or revoked sooner.       Influenza A by PCR NEGATIVE NEGATIVE Final   Influenza B by PCR NEGATIVE NEGATIVE Final    Comment: (NOTE) The Xpert Xpress SARS-CoV-2/FLU/RSV plus assay is intended as an aid in the diagnosis of influenza from Nasopharyngeal swab specimens and should not be used as a sole basis for treatment. Nasal washings and aspirates are unacceptable for Xpert Xpress SARS-CoV-2/FLU/RSV testing.  Fact Sheet for Patients: EntrepreneurPulse.com.au  Fact Sheet for Healthcare Providers: IncredibleEmployment.be  This test is not yet approved or cleared by the [place] FDA and has been authorized for detection and/or diagnosis of SARS-CoV-2 by FDA under an Emergency Use Authorization (EUA). This EUA will remain in effect (meaning this test can be used) for the duration of the COVID-19 declaration under Section 564(b)(1) of the Act, 21 U.S.C. section 360bbb-3(b)(1), unless the authorization is terminated or revoked.  Performed at [facility], [place] [address] [place], [place] [address]   Blood culture (routine x 2)     Status: None   Collection Time: 10/26/2020  6:59 PM   Specimen: BLOOD  Result Value Ref Range Status   Specimen Description BLOOD SITE NOT SPECIFIED  Final   Special Requests   Final    BOTTLES DRAWN AEROBIC AND ANAEROBIC Blood Culture results may  not be optimal due to an inadequate volume of blood received in culture bottles   Culture   Final    NO GROWTH 5 DAYS Performed at [facility], [place] [address], [place], [place] [address]    Report Status 10/08/2020 FINAL  Final  Blood culture (routine x 2)     Status: None   Collection Time: 10/04/2020  7:00 PM   Specimen: BLOOD  Result Value Ref Range Status   Specimen Description BLOOD LEFT ANTECUBITAL  Final   Special Requests   Final    BOTTLES DRAWN AEROBIC AND ANAEROBIC Blood Culture adequate volume   Culture   Final    NO GROWTH 5 DAYS Performed at [facility], [place] [address], [place], [place] [address]    Report Status 10/08/2020 FINAL  Final  Culture, Respiratory w Gram Stain     Status: None   Collection Time: 10/04/20  8:24 AM   Specimen: Tracheal Aspirate; Respiratory  Result Value Ref Range Status   Specimen Description TRACHEAL ASPIRATE  Final   Special Requests NONE  Final   Gram Stain   Final    MODERATE WBC PRESENT,BOTH PMN AND MONONUCLEAR RARE SQUAMOUS EPITHELIAL CELLS PRESENT FEW GRAM POSITIVE RODS RARE BUDDING YEAST SEEN    Culture   Final    FEW Normal respiratory flora-no Staph aureus or Pseudomonas seen Performed at [facility], [place] [address] [place], [place] [address]    Report Status 10/07/2020 FINAL  Final  MRSA PCR Screening     Status: None   Collection Time: 10/04/20  8:56 AM   Specimen: Nasopharyngeal  Result Value Ref Range Status   MRSA by PCR NEGATIVE NEGATIVE Final    Comment:        The GeneXpert MRSA Assay (FDA approved for NASAL specimens only), is one component of a comprehensive MRSA colonization surveillance program. It is not intended to diagnose MRSA infection nor to guide or monitor treatment for MRSA infections. Performed at [facility], [place] [address], [place], [place] [address]   CSF culture w Gram Stain     Status:  None   Collection Time: 10/04/20  5:01 PM   Specimen: PATH Cytology CSF;  Cerebrospinal Fluid  Result Value Ref Range Status   Specimen Description CSF  Final   Special Requests NONE  Final   Gram Stain   Final    WBC PRESENT, PREDOMINANTLY MONONUCLEAR NO ORGANISMS SEEN CYTOSPIN SMEAR    Culture   Final    NO GROWTH 3 DAYS Performed at [place] [address], [place], [place] [address]    Report Status 10/08/2020 FINAL  Final  Fungus Culture With Stain     Status: None (Preliminary result)   Collection Time: 10/04/20  5:01 PM   Specimen: PATH Cytology CSF; Cerebrospinal Fluid  Result Value Ref Range Status   Fungus Stain Final report  Final    Comment: (NOTE) Performed At: [facility] [address] [place], [place] [id number] Rush Farmer MD [id number]    Fungus (Mycology) Culture PENDING  Incomplete   Fungal Source CSF  Final    Comment: Performed at [facility], [place] [address], [place], [place] [address]  Anaerobic culture w Gram Stain     Status: None   Collection Time: 10/04/20  5:01 PM   Specimen: PATH Cytology CSF; Cerebrospinal Fluid  Result Value Ref Range Status   Specimen Description CSF  Final   Special Requests NONE  Final   Culture   Final    NO ANAEROBES ISOLATED Performed at [facility], [place] [address] [place], [place] [address]    Report Status 10/09/2020 FINAL  Final  Fungus Culture Result     Status: None   Collection Time: 10/04/20  5:01 PM  Result Value Ref Range Status   Result 1 Comment  Final    Comment: (NOTE) KOH/Calcofluor preparation:  no fungus observed. Performed At: [facility] [place], [place] [id number] Rush Farmer MD [id number]   Culture, Urine     Status: None   Collection Time: 09/30/2020  5:30 AM   Specimen: Urine, Catheterized  Result Value Ref Range Status   Specimen Description URINE, CATHETERIZED  Final   Special Requests daptomycin, zosyn Normal  Final   Culture   Final    NO GROWTH Performed at [facility], [address] [place], [place] [address]    Report Status 10/07/2020 FINAL  Final  Fungus Culture With Stain     Status: None (Preliminary result)   Collection Time: 10/15/2020  7:13 PM   Specimen: Soft Tissue, Other  Result Value Ref Range Status   Fungus Stain Final report  Final    Comment: (NOTE) Performed At: [facility] [place], [place] [id number] Rush Farmer MD [id number]    Fungus (Mycology) Culture PENDING  Incomplete   Fungal Source TOE  Final    Comment: RT GREAT TOE SOFT TISSUE Performed at [facility], [place] [address] [place], [place] [address]   Aerobic/Anaerobic Culture w Gram Stain (surgical/deep wound)     Status: None (Preliminary result)   Collection Time: 10/02/2020  7:13 PM   Specimen: Soft Tissue, Other  Result Value Ref Range Status   Specimen Description TISSUE RIGHT TOE  Final   Special Requests SOFT TISSUE  Final   Gram Stain NO WBC SEEN NO ORGANISMS SEEN   Final   Culture   Final    NO GROWTH 3 DAYS NO ANAEROBES ISOLATED; CULTURE IN PROGRESS FOR 5 DAYS Performed at [facility], [place] [address] [place], [place] [address]    Report Status PENDING  Incomplete  Fungus Culture Result     Status: None   Collection Time: 10/17/2020  7:13 PM  Result Value Ref Range Status   Result 1 Comment  Final    Comment: (NOTE) KOH/Calcofluor preparation:  no fungus observed. Performed At: [facility] [address] [place], [place] [id number] Rush Farmer MD [id number]   Fungus Culture With Stain     Status: None (Preliminary result)   Collection Time: 10/04/2020  7:15 PM   Specimen: Soft Tissue, Other  Result Value Ref Range Status   Fungus Stain Final report  Final    Comment: (NOTE) Performed At: [facility] [place], [place] [id number] Rush Farmer MD [id number]    Fungus (Mycology) Culture PENDING  Incomplete   Fungal Source BONE  Final    Comment: RT GREAT TOE  Performed at [facility], [place] [address], [place], [place] [address]   Aerobic/Anaerobic Culture w Gram Stain (surgical/deep wound)     Status: None (Preliminary result)   Collection Time: 10/19/2020  7:15 PM   Specimen: Soft Tissue, Other  Result Value Ref Range Status   Specimen Description BONE RIGHT TOE  Final   Special Requests RT GREAT TOE BONE  Final   Gram Stain NO WBC SEEN NO ORGANISMS SEEN   Final   Culture   Final    NO GROWTH 3 DAYS NO ANAEROBES ISOLATED; CULTURE IN PROGRESS FOR 5 DAYS Performed at [facility], [place] [address], [place], [place] [address]    Report Status PENDING  Incomplete  Fungus Culture Result     Status: None   Collection Time: 09/29/2020  7:15 PM  Result Value Ref Range Status   Result 1 Comment  Final    Comment: (NOTE) KOH/Calcofluor preparation:  no fungus observed. Performed At: [facility] [place], [place] [id number] Rush Farmer MD [id number]   Culture, blood (routine x 2)     Status: None (Preliminary result)   Collection Time: 10/09/20 10:15 AM   Specimen: BLOOD  Result Value Ref Range Status   Specimen Description BLOOD RIGHT ANTECUBITAL  Final   Special Requests   Final    BOTTLES DRAWN AEROBIC ONLY Blood Culture adequate volume   Culture   Final    NO GROWTH < 24 HOURS Performed at [facility], [address], [place], [place] [address]    Report Status PENDING  Incomplete  Culture, blood (routine x 2)     Status: None (Preliminary result)   Collection Time: 10/09/20 10:30 AM   Specimen: BLOOD RIGHT FOREARM  Result Value Ref Range Status   Specimen Description BLOOD RIGHT FOREARM  Final   Special Requests   Final    BOTTLES DRAWN AEROBIC ONLY Blood Culture adequate volume   Culture   Final    NO GROWTH < 24 HOURS Performed at [facility], [place] [address], [place],  [address]    Report Status PENDING  Incomplete    Studies/Results: DG CHEST PORT 1 VIEW  Result Date:  10/09/2020 CLINICAL DATA:  Hypoxia EXAM: PORTABLE CHEST 1 VIEW COMPARISON:  October 07, 2020 FINDINGS: Endotracheal tube tip is 5.2 cm above the carina. Nasogastric tube tip and side port are below the diaphragm. No pneumothorax. There is persistent airspace opacity in the left lower lobe. The right lung is now clear. Heart size  and pulmonary vascularity are normal. No adenopathy. No bone lesions. IMPRESSION: Tube positions as described without evident pneumothorax. Persistent airspace opacity consistent with pneumonia left lower lung region. Right lung now clear. Heart size within normal limits. Electronically Signed   By: Lowella Grip III M.D.   On: 10/09/2020 09:46   [place] EKG SITE RITE  Result Date: 10/10/2020 If Site Rite image not attached, placement could not be confirmed due to current cardiac rhythm.     Assessment/Plan:  INTERVAL HISTORY:  Cultures from operating room remain unrevealing.     Principal Problem:   Osteomyelitis of great toe (HCC) Active Problems:   Hypertension   Dementia (HCC)   Acute renal failure superimposed on stage 3b chronic kidney disease (HCC)   Sepsis (HCC)   Altered mental status   DKA (diabetic ketoacidosis) (HCC)   Ulcer of great toe, right, with unspecified severity ([place])   Unresponsive   Endotracheal tube present   Acute osteomyelitis of toe, right (HCC)    Tony [name] is a 70 y.o. male with admitted with a septic picture in the context of osteomyelitis of his right great toe.  His blood cultures have remained sterile and he is stabilized hemodynamically on daptomycin and Zosyn.  Yesterday he underwent amputation of his great toe which what sounds to be macroscopically good margins.  Osteomyelitis: Ultras remain unrevealing.  For now continue daptomycin and Zosyn for now.  I would want to give him at least 4 weeks of systemic IV antibiotics after his surgery to take care of any microscopic infection  Pathology report may also be  informative.      LOS: 7 days   Alcide Evener 10/10/2020, 11:37 AM

## 2020-10-11 ENCOUNTER — Inpatient Hospital Stay (HOSPITAL_COMMUNITY): Payer: Medicare (Managed Care)

## 2020-10-11 DIAGNOSIS — J9601 Acute respiratory failure with hypoxia: Secondary | ICD-10-CM | POA: Diagnosis not present

## 2020-10-11 DIAGNOSIS — M869 Osteomyelitis, unspecified: Secondary | ICD-10-CM | POA: Diagnosis not present

## 2020-10-11 DIAGNOSIS — N1832 Chronic kidney disease, stage 3b: Secondary | ICD-10-CM | POA: Diagnosis not present

## 2020-10-11 DIAGNOSIS — N179 Acute kidney failure, unspecified: Secondary | ICD-10-CM | POA: Diagnosis not present

## 2020-10-11 DIAGNOSIS — M86171 Other acute osteomyelitis, right ankle and foot: Secondary | ICD-10-CM | POA: Diagnosis not present

## 2020-10-11 DIAGNOSIS — G309 Alzheimer's disease, unspecified: Secondary | ICD-10-CM | POA: Diagnosis not present

## 2020-10-11 DIAGNOSIS — R401 Stupor: Secondary | ICD-10-CM | POA: Diagnosis not present

## 2020-10-11 DIAGNOSIS — J9621 Acute and chronic respiratory failure with hypoxia: Secondary | ICD-10-CM

## 2020-10-11 LAB — GLUCOSE, CAPILLARY
Glucose-Capillary: 111 mg/dL — ABNORMAL HIGH (ref 70–99)
Glucose-Capillary: 143 mg/dL — ABNORMAL HIGH (ref 70–99)
Glucose-Capillary: 164 mg/dL — ABNORMAL HIGH (ref 70–99)
Glucose-Capillary: 164 mg/dL — ABNORMAL HIGH (ref 70–99)
Glucose-Capillary: 174 mg/dL — ABNORMAL HIGH (ref 70–99)
Glucose-Capillary: 185 mg/dL — ABNORMAL HIGH (ref 70–99)

## 2020-10-11 LAB — CBC
HCT: 26.8 % — ABNORMAL LOW (ref 39.0–52.0)
Hemoglobin: 8.2 g/dL — ABNORMAL LOW (ref 13.0–17.0)
MCH: 31.3 pg (ref 26.0–34.0)
MCHC: 30.6 g/dL (ref 30.0–36.0)
MCV: 102.3 fL — ABNORMAL HIGH (ref 80.0–100.0)
Platelets: 380 10*3/uL (ref 150–400)
RBC: 2.62 MIL/uL — ABNORMAL LOW (ref 4.22–5.81)
RDW: 13.5 % (ref 11.5–15.5)
WBC: 18.5 10*3/uL — ABNORMAL HIGH (ref 4.0–10.5)
nRBC: 0.2 % (ref 0.0–0.2)

## 2020-10-11 LAB — BASIC METABOLIC PANEL
Anion gap: 5 (ref 5–15)
BUN: 33 mg/dL — ABNORMAL HIGH (ref 8–23)
CO2: 37 mmol/L — ABNORMAL HIGH (ref 22–32)
Calcium: 11.4 mg/dL — ABNORMAL HIGH (ref 8.9–10.3)
Chloride: 105 mmol/L (ref 98–111)
Creatinine, Ser: 1.14 mg/dL (ref 0.61–1.24)
GFR, Estimated: >60 mL/min (ref >60)
Glucose, Bld: 192 mg/dL — ABNORMAL HIGH (ref 70–99)
Potassium: 3.7 mmol/L (ref 3.5–5.1)
Sodium: 147 mmol/L — ABNORMAL HIGH (ref 135–145)

## 2020-10-11 LAB — MAGNESIUM: Magnesium: 2.2 mg/dL (ref 1.7–2.4)

## 2020-10-11 LAB — CK: Total CK: 18 U/L — ABNORMAL LOW (ref 49–397)

## 2020-10-11 LAB — PHOSPHORUS: Phosphorus: 3 mg/dL (ref 2.5–4.6)

## 2020-10-11 MED ORDER — FENTANYL CITRATE (PF) 100 MCG/2ML IJ SOLN
25.0000 ug | INTRAMUSCULAR | Status: DC | PRN
  Filled 2020-10-11: qty 2

## 2020-10-11 MED ORDER — FREE WATER
200.0000 mL | Status: DC
  Administered 2020-10-11 – 2020-10-15 (×25): 200 mL

## 2020-10-11 MED ORDER — GADOBUTROL 1 MMOL/ML IV SOLN
8.5000 mL | Freq: Once | INTRAVENOUS | Status: AC | PRN
  Administered 2020-10-11: 8.5 mL via INTRAVENOUS

## 2020-10-11 MED ORDER — VITAL 1.5 CAL PO LIQD
1000.0000 mL | ORAL | Status: DC
  Administered 2020-10-11 – 2020-10-14 (×6): 1000 mL
  Filled 2020-10-11: qty 1000

## 2020-10-11 MED ORDER — FUROSEMIDE 10 MG/ML IJ SOLN
40.0000 mg | Freq: Once | INTRAMUSCULAR | Status: AC
  Administered 2020-10-11: 40 mg via INTRAVENOUS
  Filled 2020-10-11: qty 4

## 2020-10-11 NOTE — Consult Note (Signed)
[facility] Nurse Consult Note: Patient receiving care in [facility] 3M14. Reason for Consult: stage 2 sacral wound I have initiated the Skin Care Standing Order Set available to all licensed nurses in the [facility] system.  I have activated the foam dressing order for this patient. Val Riles, RN, MSN, CWOCN, CNS-BC, pager [phone]

## 2020-10-11 NOTE — Progress Notes (Signed)
Nutrition Follow-up  DOCUMENTATION CODES:   Not applicable  INTERVENTION:   Tube Feeding via OG Increase Vital 1.5 to 60 ml/hr Continue Pro-Source 45 mL QID Provides 2320 kcals, 141 g of protein and 1094 mL of free water  Total free water with TF and current flushes: 2294 mL of free water   NUTRITION DIAGNOSIS:   Inadequate oral intake related to acute illness as evidenced by NPO status.  Being addressed via TF   GOAL:   Patient will meet greater than or equal to 90% of their needs  Met  MONITOR:   PO intake,Supplement acceptance,Labs,Weight trends  REASON FOR ASSESSMENT:   Ventilator,Consult Enteral/tube feeding initiation and management  ASSESSMENT:   70 yo admitted with AMS and sepsis due to right foot osteomyelitis, AKI, acute metabolic encephalopathy. PMH Alzheimer's disease, CKD III, DM, HTN, seizure disorder. Pt resides in [facility]  3/07 Admitted for sepsis, DKA 3/08 Unresponsive, intubated, transferred to ICU 3/10 R. Great toe amputation  Pt remains febrile, MRI right foot to rule out continued infection  Pt remains on vent support. Noted poc ongoing, pt would not want trach, PEG or prolonged ICU stay. Noted family considering one way extubation  Vital 1.5 at 55 ml/hr, Pro-Source TF 45 mL QID, free water 200 mL q 4 hours per OG  Current wt 82 kg; admit weight 84.5 kg. Net +10 L per I/O flow sheet  Labs: sodium 147 (H), corrected calcium 11.4 (H), Creatinine wdl Meds: colace, ss novolog, novolog q 4 hours, levemir, MVI with Minerals    Diet Order:   Diet Order            Diet NPO time specified  Diet effective midnight                 EDUCATION NEEDS:   Education needs have been addressed  Skin:  Skin Assessment: Skin Integrity Issues: Skin Integrity Issues:: Stage II Stage II: sacrum Other: R.great toe amputation on 3/10 for necrotic wound  Last BM:  3/14  Height:   Ht Readings from Last 1 Encounters:  10/04/20 6' (1.829 m)     Weight:   Wt Readings from Last 1 Encounters:  10/11/20 82.1 kg    BMI:  Body mass index is 24.55 kg/m.  Estimated Nutritional Needs:   Kcal:  2100-2400 kcals  Protein:  125-150 g  Fluid:  >/= 2 L   Kerman Passey MS, RDN, LDN, CNSC Registered Dietitian III Clinical Nutrition RD Pager and On-Call Pager Number Located in [place]

## 2020-10-11 NOTE — Progress Notes (Signed)
NAME:  Tony [name], MRN:  [id number], DOB:  Apr 13, 1951, LOS: 8 ADMISSION DATE:  09/30/2020, CONSULTATION DATE: 3/8 REFERRING MD: Dr. Rosine Door, CHIEF COMPLAINT: Altered mental status  Brief History:  70 yo male admitted from [facility] on 3/07 with AMS, fever (Tm 101.7), tachycardia, lactic acidosis and hyperglycemia.  Required intubation for airway protection and transferred to ICU on 3/08.  Past Medical History:  Alzheimer's dementia, BPH, CKD 3a, DM type 2, HTN, Seizure disorder, CVA  Significant Hospital Events:  3/7 admitted for sepsis/DKA 3/8 unresponsive, intubated, transferred to ICU 3/10 Right great toe amputation 3/11 Tolerate SBT. Mental status precludes extubation. Cumulative fluid balance + 3/12 Weaned off sedation. Poor mental status. Tolerating SBT 3/13 Fever  Consults:  Neurology  Procedures:  ETT 3/8 >  Significant Diagnostic Tests:   CT head 3/7 > no evidence of acute intracranial abnormality  X-ray right foot 3/7 > no gross findings suspicious of osteomyelitis  MRI right foot 3/8 > osteomyelitis of 1st distal phalanx and 1st proximal phalanx, plantar muscle edema concerning for myositis  CT head 3/8 > remote b/l cerebellar infarcts  CT abdomen pelvis 3/8 > bibasilar consolidation, heavy stool burden throughout colon, diverticulosis of descending and sigmoid colon  LP 3/8 > glucose 139, protein 113, RBC 48, WBC 6  EEG 3/8 > generalized slowing  EEG 3/10 > No evidence of epileptogenicity  Micro Data:  COVID/Flu 3/7 > negative MRSA PCR 3/7 > negative Blood culture 3/7 > Urine culture 3/7 > multiple species tracheal aspirate 3/8 > CSF 3/8 > negative Right bone/wound culture 3/10 > Blood 3/13 >>    Antimicrobials:  Cefepime 3/7 > 3/8 Vancomycin 3/7 > 3/8 Zosyn 3/8 > Daptomycin 3/8 >  Interim History / Subjective:   Culture remain negative so far, T-max 101.9 last 24 hours I/O+ 10.7 L total, serum creatinine 1.14 0.30, PEEP  5 Overnight single dose fentanyl 50 Currently comfortable on PS 12   Objective   Blood pressure 116/65, pulse (!) 107, temperature 99.8 F (37.7 C), temperature source Axillary, resp. rate 16, height 6' (1.829 m), weight 82.1 kg, SpO2 100 %.    Vent Mode: PRVC FiO2 (%):  [30 %-40 %] 30 % Set Rate:  [12 bmp] 12 bmp Vt Set:  [550 mL] 550 mL PEEP:  [5 cmH20] 5 cmH20 Pressure Support:  [12 cmH20] 12 cmH20 Plateau Pressure:  [14 cmH20-21 cmH20] 14 cmH20   Intake/Output Summary (Last 24 hours) at 10/11/2020 7591 Last data filed at 10/11/2020 0600 Gross per 24 hour  Intake 1319.79 ml  Output 2495 ml  Net -1175.21 ml   Filed Weights   10/09/20 0500 10/10/20 0410 10/11/20 0445  Weight: 85.5 kg 85.6 kg 82.1 kg   Physical Exam: General: Ill-appearing man, ventilated, no distress HENT: ET tube in position, pupils equal, oropharynx somewhat dry Respiratory: No crackles, no wheezes, decreased at both bases Cardiovascular: Distant, regular, no murmur GI: Nondistended, positive bowel sounds Extremities: 2+ pretibial edema.  Right great toe dressing in place without any evident drainage Neuro: More somnolent, tried to open eyes to voice.  Did not follow commands today   Resolved Hospital Problem list   Hypernatremia, DKA  Assessment & Plan:   Acute hypoxic respiratory failure with compromised airway in setting of aspiration pneumonitis. -Tolerating PS 12, continue to push pressure for trials.  Currently do not believe he has the mental status for adequate airway protection and a successful extubation.  We are working on optimizing respiratory status  with a goal for possible extubation and no plan for reintubation.  I discussed this with his brother [name] and this is consistent with the patient's wishes.  The patient would not want tracheostomy, PEG, prolonged ICU support or institutionalization. -Assess daily for possible diuresis, repeat Lasix 40 mg x 1 on 3/15 -VAP prevention  orders -Pulmonary hygiene -RASS goal 0   Sepsis from aspiration pneumonia and chronic osteomyelitis of Rt foot - improving S/p right great toe amputation 3/10 -Day 9 total antibiotics, currently daptomycin and Zosyn.  Appreciate ID input and assistance.  Continue as per ID recs -Continue to follow culture data from wound, blood -Follow chest x-ray -Postop wound care for right great toe  Acute metabolic encephalopathy from sepsis, hyperglycemia. Hx of seizures, Alzheimer dementia, CVA. -Minimize sedating medication, more somnolent today 3/15 -Continue Depakote -Continue Aricept, Namenda, Paxil  DM type 2 poorly controlled with hyperglycemia. -Continue Levemir 45 units twice daily -Sliding scale insulin, TF coverage 7 units  AKI from ATN in setting of sepsis. Improved. Fair UOP CKD 3a. Lactic acidosis. Hx of BPH. -Follow urine output, BMP and dose diuretics daily.  Repeat Lasix 40 mg x 1 as above -Outpatient Flomax on hold  Hyponatremia -Follow BMP -Low-dose free water added on 3/14, may need to increase over the next few days  Anemia of critical illness and chronic disease. -Following CBC -Hemoglobin transfusion threshold 7.0  Hx of HTN. -Outpatient amlodipine on hold given relative hypotension  Best practice (evaluated daily)  Diet: tube feeds DVT prophylaxis: Lovenox GI prophylaxis: PPI Glucose control: Sliding scale insulin, Lantus Mobility: Bedrest Disposition:ICU Code Status: FULL Family: Discussed status with patient's brother [name] on 3/ 15  Labs    CMP Latest Ref Rng & Units 10/11/2020 10/10/2020 10/09/2020  Glucose 70 - 99 mg/dL 192(H) 214(H) 217(H)  BUN 8 - 23 mg/dL 33(H) 31(H) 26(H)  Creatinine 0.61 - 1.24 mg/dL 1.14 1.05 1.07  Sodium 135 - 145 mmol/L 147(H) 145 147(H)  Potassium 3.5 - 5.1 mmol/L 3.7 3.3(L) 3.6  Chloride 98 - 111 mmol/L 105 105 109  CO2 22 - 32 mmol/L 37(H) 33(H) 30  Calcium 8.9 - 10.3 mg/dL 11.4(H) 11.3(H) 10.5(H)  Total Protein 6.5  - 8.1 g/dL - - -  Total Bilirubin 0.3 - 1.2 mg/dL - - -  Alkaline Phos 38 - 126 U/L - - -  AST 15 - 41 U/L - - -  ALT 0 - 44 U/L - - -    CBC Latest Ref Rng & Units 10/11/2020 10/10/2020 10/09/2020  WBC 4.0 - 10.5 K/uL 18.5(H) 18.5(H) 12.4(H)  Hemoglobin 13.0 - 17.0 g/dL 8.2(L) 9.2(L) 8.9(L)  Hematocrit 39.0 - 52.0 % 26.8(L) 29.8(L) 28.8(L)  Platelets 150 - 400 K/uL 380 322 167    ABG    Component Value Date/Time   PHART 7.529 (H) 10/04/2020 0923   PCO2ART 32.1 10/04/2020 0923   PO2ART 174 (H) 10/04/2020 0923   HCO3 26.4 10/04/2020 0923   TCO2 27 10/04/2020 0923   ACIDBASEDEF 3.7 (H) 02/07/2010 1730   O2SAT 100.0 10/04/2020 0923    CBG (last 3)  Recent Labs    10/10/20 2349 10/11/20 0341 10/11/20 0801  GLUCAP 146* 174* 185*    Critical care time: 32 minutes   The patient is critically ill with multiple organ systems failure and requires high complexity decision making for assessment and support, frequent evaluation and titration of therapies, application of advanced monitoring technologies and extensive interpretation of multiple databases.  Independent Critical Care Time:  32 Minutes.   Baltazar Apo, MD, PhD 10/11/2020, 8:22 AM [facility] [phone] or if no answer before 7:00PM call [phone] For any issues after 7:00PM please call eLink [phone]

## 2020-10-11 NOTE — Progress Notes (Signed)
[facility] for Infectious Disease  Date of Admission:  10/25/2020     Total days of antibiotics 9         ASSESSMENT:  Mr. Manville continues to have intermittent fevers s/p right great toe amputation at the MPJ level. Remains intubated secondary to mental status. Will check MRI right foot for any additional sources of infection. Aspiration pneumonia likely treated with 7 days of Zoysn. Continue with broad spectrum coverage pending MRI results. Family considering one-way extubation as trach has been declined.   PLAN:  1. Continue daptomycin and pip/tazo. 2. MRI right foot to rule out sources of infection. 3. Ventilatory management per CCM.   Principal Problem:   Osteomyelitis of great toe (HCC) Active Problems:   Hypertension   Dementia (HCC)   Acute renal failure superimposed on stage 3b chronic kidney disease (HCC)   Sepsis (HCC)   Altered mental status   DKA (diabetic ketoacidosis) (HCC)   Ulcer of great toe, right, with unspecified severity ([place])   Unresponsive   Endotracheal tube present   Acute osteomyelitis of toe, right ([place])   . chlorhexidine gluconate (MEDLINE KIT)  15 mL Mouth Rinse BID  . Chlorhexidine Gluconate Cloth  6 each Topical Daily  . docusate  100 mg Per Tube BID  . donepezil  10 mg Per Tube QHS  . enoxaparin (LOVENOX) injection  40 mg Subcutaneous Q24H  . feeding supplement (PROSource TF)  45 mL Per Tube QID  . free water  200 mL Per Tube Q4H  . insulin aspart  0-20 Units Subcutaneous Q4H  . insulin aspart  7 Units Subcutaneous Q4H  . insulin detemir  45 Units Subcutaneous BID  . mouth rinse  15 mL Mouth Rinse 10 times per day  . memantine  10 mg Per Tube BID  . multivitamin with minerals  1 tablet Per Tube Daily  . mupirocin ointment  1 application Topical BID  . pantoprazole sodium  40 mg Per Tube Q24H  . PARoxetine  10 mg Per Tube Daily  . polyethylene glycol  17 g Per Tube Daily  . sodium chloride flush  10-40 mL Intracatheter Q12H  .  thiamine  100 mg Per Tube Daily  . valproic acid  500 mg Per Tube Q8H    SUBJECTIVE:  Intubated and not currently on sedation. Febrile to 101.9 overnight.   No Known Allergies   Review of Systems: Review of Systems  Unable to perform ROS: Intubated      OBJECTIVE: Vitals:   10/11/20 0600 10/11/20 0700 10/11/20 0805 10/11/20 1140  BP: 116/65  116/67 122/61  Pulse: (!) 107  (!) 106 (!) 115  Resp: 16  15 (!) 23  Temp:  99.8 F (37.7 C)  99 F (37.2 C)  TempSrc:  Axillary  Axillary  SpO2: 100%  100% 100%  Weight:      Height:       Body mass index is 24.55 kg/m.  Physical Exam Constitutional:      General: He is not in acute distress.    Appearance: He is well-developed.     Comments: Lying in bed with head of bed elevated  Cardiovascular:     Rate and Rhythm: Regular rhythm. Tachycardia present.     Heart sounds: Normal heart sounds.  Pulmonary:     Effort: Pulmonary effort is normal.     Breath sounds: Normal breath sounds.  Musculoskeletal:     Comments: Surgical dressing clean and dry  Skin:  General: Skin is warm and dry.     Lab Results Lab Results  Component Value Date   WBC 18.5 (H) 10/11/2020   HGB 8.2 (L) 10/11/2020   HCT 26.8 (L) 10/11/2020   MCV 102.3 (H) 10/11/2020   PLT 380 10/11/2020    Lab Results  Component Value Date   CREATININE 1.14 10/11/2020   BUN 33 (H) 10/11/2020   NA 147 (H) 10/11/2020   K 3.7 10/11/2020   CL 105 10/11/2020   CO2 37 (H) 10/11/2020    Lab Results  Component Value Date   ALT 9 10/04/2020   AST 21 10/04/2020   ALKPHOS 51 10/04/2020   BILITOT 0.8 10/04/2020     Microbiology: Recent Results (from the past 240 hour(s))  Urine culture     Status: Abnormal   Collection Time: 10/02/2020  2:42 PM   Specimen: Urine, Random  Result Value Ref Range Status   Specimen Description URINE, RANDOM  Final   Special Requests   Final    NONE Performed at [facility], [address], [place], [place]  [address]    Culture MULTIPLE SPECIES PRESENT, SUGGEST RECOLLECTION (A)  Final   Report Status 10/05/2020 FINAL  Final  Resp Panel by RT-PCR (Flu A&B, Covid) Nasopharyngeal Swab     Status: None   Collection Time: 10/27/2020  2:55 PM   Specimen: Nasopharyngeal Swab; Nasopharyngeal(NP) swabs in vial transport medium  Result Value Ref Range Status   SARS Coronavirus 2 by RT PCR NEGATIVE NEGATIVE Final    Comment: (NOTE) SARS-CoV-2 target nucleic acids are NOT DETECTED.  The SARS-CoV-2 RNA is generally detectable in upper respiratory specimens during the acute phase of infection. The lowest concentration of SARS-CoV-2 viral copies this assay can detect is 138 copies/mL. A negative result does not preclude SARS-Cov-2 infection and should not be used as the sole basis for treatment or other patient management decisions. A negative result may occur with  improper specimen collection/handling, submission of specimen other than nasopharyngeal swab, presence of viral mutation(s) within the areas targeted by this assay, and inadequate number of viral copies(<138 copies/mL). A negative result must be combined with clinical observations, patient history, and epidemiological information. The expected result is Negative.  Fact Sheet for Patients:  EntrepreneurPulse.com.au  Fact Sheet for Healthcare Providers:  IncredibleEmployment.be  This test is no t yet approved or cleared by the [place] FDA and  has been authorized for detection and/or diagnosis of SARS-CoV-2 by FDA under an Emergency Use Authorization (EUA). This EUA will remain  in effect (meaning this test can be used) for the duration of the COVID-19 declaration under Section 564(b)(1) of the Act, 21 U.S.C.section 360bbb-3(b)(1), unless the authorization is terminated  or revoked sooner.       Influenza A by PCR NEGATIVE NEGATIVE Final   Influenza B by PCR NEGATIVE NEGATIVE Final    Comment:  (NOTE) The Xpert Xpress SARS-CoV-2/FLU/RSV plus assay is intended as an aid in the diagnosis of influenza from Nasopharyngeal swab specimens and should not be used as a sole basis for treatment. Nasal washings and aspirates are unacceptable for Xpert Xpress SARS-CoV-2/FLU/RSV testing.  Fact Sheet for Patients: EntrepreneurPulse.com.au  Fact Sheet for Healthcare Providers: IncredibleEmployment.be  This test is not yet approved or cleared by the [place] FDA and has been authorized for detection and/or diagnosis of SARS-CoV-2 by FDA under an Emergency Use Authorization (EUA). This EUA will remain in effect (meaning this test can be used) for the  duration of the COVID-19 declaration under Section 564(b)(1) of the Act, 21 U.S.C. section 360bbb-3(b)(1), unless the authorization is terminated or revoked.  Performed at [facility], [place] [address] [place], [place] [address]   Blood culture (routine x 2)     Status: None   Collection Time: 10/08/2020  6:59 PM   Specimen: BLOOD  Result Value Ref Range Status   Specimen Description BLOOD SITE NOT SPECIFIED  Final   Special Requests   Final    BOTTLES DRAWN AEROBIC AND ANAEROBIC Blood Culture results may not be optimal due to an inadequate volume of blood received in culture bottles   Culture   Final    NO GROWTH 5 DAYS Performed at [facility], [place] [address] [place], [place] [address]    Report Status 10/08/2020 FINAL  Final  Blood culture (routine x 2)     Status: None   Collection Time: 09/29/2020  7:00 PM   Specimen: BLOOD  Result Value Ref Range Status   Specimen Description BLOOD LEFT ANTECUBITAL  Final   Special Requests   Final    BOTTLES DRAWN AEROBIC AND ANAEROBIC Blood Culture adequate volume   Culture   Final    NO GROWTH 5 DAYS Performed at [facility], [place] [address], [place], [place] [address]    Report Status 10/08/2020 FINAL  Final  Culture, Respiratory w  Gram Stain     Status: None   Collection Time: 10/04/20  8:24 AM   Specimen: Tracheal Aspirate; Respiratory  Result Value Ref Range Status   Specimen Description TRACHEAL ASPIRATE  Final   Special Requests NONE  Final   Gram Stain   Final    MODERATE WBC PRESENT,BOTH PMN AND MONONUCLEAR RARE SQUAMOUS EPITHELIAL CELLS PRESENT FEW GRAM POSITIVE RODS RARE BUDDING YEAST SEEN    Culture   Final    FEW Normal respiratory flora-no Staph aureus or Pseudomonas seen Performed at [facility], [place] [address], [place], [place] [address]    Report Status 10/07/2020 FINAL  Final  MRSA PCR Screening     Status: None   Collection Time: 10/04/20  8:56 AM   Specimen: Nasopharyngeal  Result Value Ref Range Status   MRSA by PCR NEGATIVE NEGATIVE Final    Comment:        The GeneXpert MRSA Assay (FDA approved for NASAL specimens only), is one component of a comprehensive MRSA colonization surveillance program. It is not intended to diagnose MRSA infection nor to guide or monitor treatment for MRSA infections. Performed at [facility], [place] [address] [place], [place] [address]   CSF culture w Gram Stain     Status: None   Collection Time: 10/04/20  5:01 PM   Specimen: PATH Cytology CSF; Cerebrospinal Fluid  Result Value Ref Range Status   Specimen Description CSF  Final   Special Requests NONE  Final   Gram Stain   Final    WBC PRESENT, PREDOMINANTLY MONONUCLEAR NO ORGANISMS SEEN CYTOSPIN SMEAR    Culture   Final    NO GROWTH 3 DAYS Performed at [facility], [place] [address], [place], [place] [address]    Report Status 10/08/2020 FINAL  Final  Fungus Culture With Stain     Status: None (Preliminary result)   Collection Time: 10/04/20  5:01 PM   Specimen: PATH Cytology CSF; Cerebrospinal Fluid  Result Value Ref Range Status   Fungus Stain Final report  Final  Comment: (NOTE) Performed At: [facility] [place], [place] [id number] Rush Farmer MD [id number]    Fungus (Mycology) Culture PENDING  Incomplete   Fungal Source CSF  Final    Comment: Performed at [facility], [place] [address] [place], [place] [address]  Anaerobic culture w Gram Stain     Status: None   Collection Time: 10/04/20  5:01 PM   Specimen: PATH Cytology CSF; Cerebrospinal Fluid  Result Value Ref Range Status   Specimen Description CSF  Final   Special Requests NONE  Final   Culture   Final    NO ANAEROBES ISOLATED Performed at [facility], [place] [address], [place], [place] [address]    Report Status 10/09/2020 FINAL  Final  Fungus Culture Result     Status: None   Collection Time: 10/04/20  5:01 PM  Result Value Ref Range Status   Result 1 Comment  Final    Comment: (NOTE) KOH/Calcofluor preparation:  no fungus observed. Performed At: [facility] [place], [place] [id number] Rush Farmer MD [id number]   Culture, Urine     Status: None   Collection Time: 10/05/2020  5:30 AM   Specimen: Urine, Catheterized  Result Value Ref Range Status   Specimen Description URINE, CATHETERIZED  Final   Special Requests daptomycin, zosyn Normal  Final   Culture   Final    NO GROWTH Performed at [facility], [address] [place], [place] [address]    Report Status 10/07/2020 FINAL  Final  Fungus Culture With Stain     Status: None (Preliminary result)   Collection Time: 09/29/2020  7:13 PM   Specimen: Soft Tissue, Other  Result Value Ref Range Status   Fungus Stain Final report  Final    Comment: (NOTE) Performed At: [facility] [place], [place] [id number] Rush Farmer MD [id number]    Fungus (Mycology) Culture PENDING  Incomplete   Fungal Source TOE  Final    Comment: RT GREAT TOE SOFT TISSUE Performed at [facility], [place] [address] [place], [place] [address]   Aerobic/Anaerobic Culture w Gram Stain (surgical/deep wound)     Status: None (Preliminary result)    Collection Time: 10/10/2020  7:13 PM   Specimen: Soft Tissue, Other  Result Value Ref Range Status   Specimen Description TISSUE RIGHT TOE  Final   Special Requests SOFT TISSUE  Final   Gram Stain NO WBC SEEN NO ORGANISMS SEEN   Final   Culture   Final    NO GROWTH 3 DAYS NO ANAEROBES ISOLATED; CULTURE IN PROGRESS FOR 5 DAYS Performed at [facility], [place] [address] [place], [place] [address]    Report Status PENDING  Incomplete  Fungus Culture Result     Status: None   Collection Time: 10/09/2020  7:13 PM  Result Value Ref Range Status   Result 1 Comment  Final    Comment: (NOTE) KOH/Calcofluor preparation:  no fungus observed. Performed At: [facility] [place], [place] [id number] Rush Farmer MD [id number]   Fungus Culture With Stain     Status: None (Preliminary result)   Collection Time: 10/01/2020  7:15 PM   Specimen: Soft Tissue, Other  Result Value Ref Range Status   Fungus Stain Final report  Final    Comment: (NOTE) Performed At: [facility] [place], [place] [id number] Rush Farmer MD [id number]  Fungus (Mycology) Culture PENDING  Incomplete   Fungal Source BONE  Final    Comment: RT GREAT TOE  Performed at [facility], [place] [address] [place], [place] [address]   Aerobic/Anaerobic Culture w Gram Stain (surgical/deep wound)     Status: None (Preliminary result)   Collection Time: 10/27/2020  7:15 PM   Specimen: Soft Tissue, Other  Result Value Ref Range Status   Specimen Description BONE RIGHT TOE  Final   Special Requests RT GREAT TOE BONE  Final   Gram Stain NO WBC SEEN NO ORGANISMS SEEN   Final   Culture   Final    NO GROWTH 3 DAYS NO ANAEROBES ISOLATED; CULTURE IN PROGRESS FOR 5 DAYS Performed at [facility], [place] [address], [place], [place] [address]    Report Status PENDING  Incomplete  Fungus Culture Result     Status: None   Collection Time: 10/13/2020  7:15 PM  Result Value  Ref Range Status   Result 1 Comment  Final    Comment: (NOTE) KOH/Calcofluor preparation:  no fungus observed. Performed At: [facility] [place], [place] [id number] Rush Farmer MD [id number]   Culture, blood (routine x 2)     Status: None (Preliminary result)   Collection Time: 10/09/20 10:15 AM   Specimen: BLOOD  Result Value Ref Range Status   Specimen Description BLOOD RIGHT ANTECUBITAL  Final   Special Requests   Final    BOTTLES DRAWN AEROBIC ONLY Blood Culture adequate volume   Culture   Final    NO GROWTH 2 DAYS Performed at [place] [address] [place], [place] [address]    Report Status PENDING  Incomplete  Culture, blood (routine x 2)     Status: None (Preliminary result)   Collection Time: 10/09/20 10:30 AM   Specimen: BLOOD RIGHT FOREARM  Result Value Ref Range Status   Specimen Description BLOOD RIGHT FOREARM  Final   Special Requests   Final    BOTTLES DRAWN AEROBIC ONLY Blood Culture adequate volume   Culture   Final    NO GROWTH 2 DAYS Performed at [facility], [place] [address] [place], [place] [address]    Report Status PENDING  Incomplete     Terri Piedra, [facility]  10/11/2020  12:44 PM

## 2020-10-12 ENCOUNTER — Inpatient Hospital Stay (HOSPITAL_COMMUNITY): Payer: Medicare (Managed Care)

## 2020-10-12 DIAGNOSIS — M869 Osteomyelitis, unspecified: Secondary | ICD-10-CM | POA: Diagnosis not present

## 2020-10-12 DIAGNOSIS — N179 Acute kidney failure, unspecified: Secondary | ICD-10-CM | POA: Diagnosis not present

## 2020-10-12 DIAGNOSIS — M86171 Other acute osteomyelitis, right ankle and foot: Secondary | ICD-10-CM | POA: Diagnosis not present

## 2020-10-12 DIAGNOSIS — J9621 Acute and chronic respiratory failure with hypoxia: Secondary | ICD-10-CM | POA: Diagnosis not present

## 2020-10-12 DIAGNOSIS — G309 Alzheimer's disease, unspecified: Secondary | ICD-10-CM | POA: Diagnosis not present

## 2020-10-12 DIAGNOSIS — R400 Somnolence: Secondary | ICD-10-CM | POA: Diagnosis not present

## 2020-10-12 DIAGNOSIS — R401 Stupor: Secondary | ICD-10-CM | POA: Diagnosis not present

## 2020-10-12 LAB — CBC
HCT: 30.7 % — ABNORMAL LOW (ref 39.0–52.0)
Hemoglobin: 9.3 g/dL — ABNORMAL LOW (ref 13.0–17.0)
MCH: 31.5 pg (ref 26.0–34.0)
MCHC: 30.3 g/dL (ref 30.0–36.0)
MCV: 104.1 fL — ABNORMAL HIGH (ref 80.0–100.0)
Platelets: 500 10*3/uL — ABNORMAL HIGH (ref 150–400)
RBC: 2.95 MIL/uL — ABNORMAL LOW (ref 4.22–5.81)
RDW: 13.6 % (ref 11.5–15.5)
WBC: 19 10*3/uL — ABNORMAL HIGH (ref 4.0–10.5)
nRBC: 0.1 % (ref 0.0–0.2)

## 2020-10-12 LAB — GLUCOSE, CAPILLARY
Glucose-Capillary: 192 mg/dL — ABNORMAL HIGH (ref 70–99)
Glucose-Capillary: 219 mg/dL — ABNORMAL HIGH (ref 70–99)
Glucose-Capillary: 233 mg/dL — ABNORMAL HIGH (ref 70–99)
Glucose-Capillary: 236 mg/dL — ABNORMAL HIGH (ref 70–99)
Glucose-Capillary: 260 mg/dL — ABNORMAL HIGH (ref 70–99)
Glucose-Capillary: 192 mg/dL — ABNORMAL HIGH (ref 70–99)

## 2020-10-12 LAB — AEROBIC/ANAEROBIC CULTURE W GRAM STAIN (SURGICAL/DEEP WOUND)
Culture: NO GROWTH
Culture: NO GROWTH
Gram Stain: NONE SEEN
Gram Stain: NONE SEEN

## 2020-10-12 LAB — MAGNESIUM: Magnesium: 2.1 mg/dL (ref 1.7–2.4)

## 2020-10-12 LAB — BASIC METABOLIC PANEL
Anion gap: 10 (ref 5–15)
BUN: 39 mg/dL — ABNORMAL HIGH (ref 8–23)
CO2: 34 mmol/L — ABNORMAL HIGH (ref 22–32)
Calcium: 12 mg/dL — ABNORMAL HIGH (ref 8.9–10.3)
Chloride: 102 mmol/L (ref 98–111)
Creatinine, Ser: 1.33 mg/dL — ABNORMAL HIGH (ref 0.61–1.24)
GFR, Estimated: 58 mL/min — ABNORMAL LOW (ref >60)
Glucose, Bld: 233 mg/dL — ABNORMAL HIGH (ref 70–99)
Potassium: 4.1 mmol/L (ref 3.5–5.1)
Sodium: 146 mmol/L — ABNORMAL HIGH (ref 135–145)

## 2020-10-12 LAB — VITAMIN B1: Vitamin B1 (Thiamine): 295 nmol/L — ABNORMAL HIGH (ref 66.5–200.0)

## 2020-10-12 MED ORDER — VANCOMYCIN HCL 1500 MG/300ML IV SOLN
1500.0000 mg | INTRAVENOUS | Status: DC
  Administered 2020-10-13 – 2020-10-15 (×3): 1500 mg via INTRAVENOUS
  Filled 2020-10-12 (×4): qty 300

## 2020-10-12 MED ORDER — FUROSEMIDE 10 MG/ML IJ SOLN
40.0000 mg | Freq: Once | INTRAMUSCULAR | Status: AC
  Administered 2020-10-12: 40 mg via INTRAVENOUS
  Filled 2020-10-12: qty 4

## 2020-10-12 MED ORDER — VANCOMYCIN HCL 1750 MG/350ML IV SOLN
1750.0000 mg | Freq: Once | INTRAVENOUS | Status: AC
  Administered 2020-10-12: 1750 mg via INTRAVENOUS
  Filled 2020-10-12: qty 350

## 2020-10-12 MED ORDER — SODIUM CHLORIDE 0.9 % IV SOLN
2.0000 g | INTRAVENOUS | Status: DC
  Administered 2020-10-12 – 2020-10-16 (×5): 2 g via INTRAVENOUS
  Filled 2020-10-12 (×5): qty 20

## 2020-10-12 NOTE — Progress Notes (Signed)
Pharmacy Antibiotic Note  Tony [name] is a 70 y.o. male admitted on 10/19/2020 with osteomyelitis.  Pharmacy has been consulted for vancomycin dosing.  Patient initially on daptomycin and zosyn for osteomyelitis of the right great toe. He is s/p amputation since 3/10. Cultures remained negative throughout admission. Patient likely to discharge to SNF for further care. Now transitioning to ceftriaxone plus vancomycin for long term IV antibiotic course.   Plan: Start vancomycin 1750mg  IV x1, then 1500mg  IV q24h Continue ceftriaxone 2g IV q24h  Monitor clinical improvement Obtain vancomycin levels as clinically indicated   Height: 6' (182.9 cm) Weight: 82.4 kg (181 lb 10.5 oz) IBW/kg (Calculated) : 77.6  Temp (24hrs), Avg:100 F (37.8 C), Min:98.6 F (37 C), Max:101.3 F (38.5 C)  Recent Labs  Lab 10/02/2020 0205 10/07/20 0301 10/08/20 1153 10/09/20 0448 10/10/20 1211 10/11/20 0539 10/12/20 0259  WBC  --  14.0*  --  12.4* 18.5* 18.5* 19.0*  CREATININE 1.09 1.13 1.11 1.07 1.05 1.14 1.33*  LATICACIDVEN 4.2*  --   --   --   --   --   --     Estimated Creatinine Clearance: 56.7 mL/min (A) (by C-G formula based on SCr of 1.33 mg/dL (H)).    No Known Allergies  Antimicrobials this admission: Cefepime 3/7  x1  Metronidazole 3/7 x1 Vanc 3/7 x1, 3/16 >>  Daptomycin 3/8 >> 3/16 Zosyn 3/8 >> 3/16 CTX 3/16>>  Thank you for allowing pharmacy to be a part of this patient's care.  Claudina Lick, PharmD PGY1 Acute Care Pharmacy Resident 10/12/2020 11:51 AM  Please check [email or web address] for unit-specific pharmacy phone numbers.

## 2020-10-12 NOTE — Progress Notes (Signed)
RT NOTE: patient placed on CPAP/PSV of 12/5 at 0830.  Currently tolerating well.  Will continue to monitor.  

## 2020-10-12 NOTE — Progress Notes (Signed)
Subjective: Intubated   Antibiotics:  Anti-infectives (From admission, onward)   Start     Dose/Rate Route Frequency Ordered Stop   10/04/20 1530  ceFEPIme (MAXIPIME) 2 g in sodium chloride 0.9 % 100 mL IVPB  Status:  Discontinued        2 g 200 mL/hr over 30 Minutes Intravenous Every 24 hours 10/22/2020 1615 10/04/20 0905   10/04/20 1130  DAPTOmycin (CUBICIN) 750 mg in sodium chloride 0.9 % IVPB        750 mg 230 mL/hr over 30 Minutes Intravenous Daily 10/04/20 1042     10/04/20 1000  piperacillin-tazobactam (ZOSYN) IVPB 3.375 g        3.375 g 12.5 mL/hr over 240 Minutes Intravenous Every 8 hours 10/04/20 0920     10/20/2020 1615  vancomycin variable dose per unstable renal function (pharmacist dosing)  Status:  Discontinued         Does not apply See admin instructions 10/16/2020 1615 10/04/20 1042   10/23/2020 1515  ceFEPIme (MAXIPIME) 2 g in sodium chloride 0.9 % 100 mL IVPB        2 g 200 mL/hr over 30 Minutes Intravenous  Once 10/26/2020 1505 10/02/2020 1627   09/28/2020 1515  metroNIDAZOLE (FLAGYL) IVPB 500 mg        500 mg 100 mL/hr over 60 Minutes Intravenous  Once 10/11/2020 1505 10/16/2020 1645   10/16/2020 1515  vancomycin (VANCOREADY) IVPB 1750 mg/350 mL        1,750 mg 175 mL/hr over 120 Minutes Intravenous  Once 10/15/2020 1505 10/27/2020 1857      Medications: Scheduled Meds: . chlorhexidine gluconate (MEDLINE KIT)  15 mL Mouth Rinse BID  . Chlorhexidine Gluconate Cloth  6 each Topical Daily  . docusate  100 mg Per Tube BID  . donepezil  10 mg Per Tube QHS  . enoxaparin (LOVENOX) injection  40 mg Subcutaneous Q24H  . feeding supplement (PROSource TF)  45 mL Per Tube QID  . free water  200 mL Per Tube Q4H  . furosemide  40 mg Intravenous Once  . insulin aspart  0-20 Units Subcutaneous Q4H  . insulin aspart  7 Units Subcutaneous Q4H  . insulin detemir  45 Units Subcutaneous BID  . mouth rinse  15 mL Mouth Rinse 10 times per day  . memantine  10 mg Per Tube BID  .  multivitamin with minerals  1 tablet Per Tube Daily  . mupirocin ointment  1 application Topical BID  . pantoprazole sodium  40 mg Per Tube Q24H  . PARoxetine  10 mg Per Tube Daily  . polyethylene glycol  17 g Per Tube Daily  . sodium chloride flush  10-40 mL Intracatheter Q12H  . thiamine  100 mg Per Tube Daily  . valproic acid  500 mg Per Tube Q8H   Continuous Infusions: . sodium chloride Stopped (10/05/2020 0609)  . DAPTOmycin (CUBICIN)  IV Stopped (10/11/20 2132)  . feeding supplement (VITAL 1.5 CAL) 1,000 mL (10/12/20 0745)  . piperacillin-tazobactam (ZOSYN)  IV 3.375 g (10/12/20 0954)   PRN Meds:.sodium chloride, acetaminophen (TYLENOL) oral liquid 160 mg/5 mL **OR** acetaminophen, dextrose, fentaNYL (SUBLIMAZE) injection, midazolam, ondansetron **OR** ondansetron (ZOFRAN) IV, sodium chloride flush    Objective: Weight change: 0.3 kg  Intake/Output Summary (Last 24 hours) at 10/12/2020 1058 Last data filed at 10/12/2020 1000 Gross per 24 hour  Intake 1503.57 ml  Output 2516 ml  Net -1012.43 ml   Blood pressure  117/65, pulse (!) 111, temperature 99.2 F (37.3 C), temperature source Axillary, resp. rate 15, height 6' (1.829 m), weight 82.4 kg, SpO2 98 %. Temp:  [98.6 F (37 C)-101.3 F (38.5 C)] 99.2 F (37.3 C) (03/16 0700) Pulse Rate:  [106-118] 111 (03/16 1000) Resp:  [11-25] 15 (03/16 1000) BP: (84-159)/(54-146) 117/65 (03/16 1000) SpO2:  [96 %-100 %] 98 % (03/16 1000) FiO2 (%):  [30 %] 30 % (03/16 0832) Weight:  [82.4 kg] 82.4 kg (03/16 0455)  Physical Exam: Physical Exam Constitutional:      Interventions: He is intubated.  HENT:     Head: Normocephalic.  Cardiovascular:     Rate and Rhythm: Tachycardia present.  Pulmonary:     Effort: He is intubated.  Abdominal:     General: There is no distension.  Skin:    General: Skin is warm and dry.  Neurological:     General: No focal deficit present.     Foot bandaged at site of  amputation    CBC:    BMET Recent Labs    10/11/20 0539 10/12/20 0259  NA 147* 146*  K 3.7 4.1  CL 105 102  CO2 37* 34*  GLUCOSE 192* 233*  BUN 33* 39*  CREATININE 1.14 1.33*  CALCIUM 11.4* 12.0*     Liver Panel  No results for input(s): PROT, ALBUMIN, AST, ALT, ALKPHOS, BILITOT, BILIDIR, IBILI in the last 72 hours.     Sedimentation Rate No results for input(s): ESRSEDRATE in the last 72 hours. C-Reactive Protein No results for input(s): CRP in the last 72 hours.  Micro Results: Recent Results (from the past 720 hour(s))  Urine culture     Status: Abnormal   Collection Time: 09/29/2020  2:42 PM   Specimen: Urine, Random  Result Value Ref Range Status   Specimen Description URINE, RANDOM  Final   Special Requests   Final    NONE Performed at [facility], [address], [place], [place] [address]    Culture MULTIPLE SPECIES PRESENT, SUGGEST RECOLLECTION (A)  Final   Report Status 10/05/2020 FINAL  Final  Resp Panel by RT-PCR (Flu A&B, Covid) Nasopharyngeal Swab     Status: None   Collection Time: 09/27/2020  2:55 PM   Specimen: Nasopharyngeal Swab; Nasopharyngeal(NP) swabs in vial transport medium  Result Value Ref Range Status   SARS Coronavirus 2 by RT PCR NEGATIVE NEGATIVE Final    Comment: (NOTE) SARS-CoV-2 target nucleic acids are NOT DETECTED.  The SARS-CoV-2 RNA is generally detectable in upper respiratory specimens during the acute phase of infection. The lowest concentration of SARS-CoV-2 viral copies this assay can detect is 138 copies/mL. A negative result does not preclude SARS-Cov-2 infection and should not be used as the sole basis for treatment or other patient management decisions. A negative result may occur with  improper specimen collection/handling, submission of specimen other than nasopharyngeal swab, presence of viral mutation(s) within the areas targeted by this assay, and inadequate number of viral copies(<138 copies/mL). A  negative result must be combined with clinical observations, patient history, and epidemiological information. The expected result is Negative.  Fact Sheet for Patients:  EntrepreneurPulse.com.au  Fact Sheet for Healthcare Providers:  IncredibleEmployment.be  This test is no t yet approved or cleared by the [place] FDA and  has been authorized for detection and/or diagnosis of SARS-CoV-2 by FDA under an Emergency Use Authorization (EUA). This EUA will remain  in effect (meaning this test can be used) for  the duration of the COVID-19 declaration under Section 564(b)(1) of the Act, 21 U.S.C.section 360bbb-3(b)(1), unless the authorization is terminated  or revoked sooner.       Influenza A by PCR NEGATIVE NEGATIVE Final   Influenza B by PCR NEGATIVE NEGATIVE Final    Comment: (NOTE) The Xpert Xpress SARS-CoV-2/FLU/RSV plus assay is intended as an aid in the diagnosis of influenza from Nasopharyngeal swab specimens and should not be used as a sole basis for treatment. Nasal washings and aspirates are unacceptable for Xpert Xpress SARS-CoV-2/FLU/RSV testing.  Fact Sheet for Patients: EntrepreneurPulse.com.au  Fact Sheet for Healthcare Providers: IncredibleEmployment.be  This test is not yet approved or cleared by the [place] FDA and has been authorized for detection and/or diagnosis of SARS-CoV-2 by FDA under an Emergency Use Authorization (EUA). This EUA will remain in effect (meaning this test can be used) for the duration of the COVID-19 declaration under Section 564(b)(1) of the Act, 21 U.S.C. section 360bbb-3(b)(1), unless the authorization is terminated or revoked.  Performed at [facility], [place] [address], [place], [place] [address]   Blood culture (routine x 2)     Status: None   Collection Time: 10/16/2020  6:59 PM   Specimen: BLOOD  Result Value Ref Range Status   Specimen  Description BLOOD SITE NOT SPECIFIED  Final   Special Requests   Final    BOTTLES DRAWN AEROBIC AND ANAEROBIC Blood Culture results may not be optimal due to an inadequate volume of blood received in culture bottles   Culture   Final    NO GROWTH 5 DAYS Performed at [facility], [place] [address], [place], [place] [address]    Report Status 10/08/2020 FINAL  Final  Blood culture (routine x 2)     Status: None   Collection Time: 10/07/2020  7:00 PM   Specimen: BLOOD  Result Value Ref Range Status   Specimen Description BLOOD LEFT ANTECUBITAL  Final   Special Requests   Final    BOTTLES DRAWN AEROBIC AND ANAEROBIC Blood Culture adequate volume   Culture   Final    NO GROWTH 5 DAYS Performed at [facility], [place] [address] [place], [place] [address]    Report Status 10/08/2020 FINAL  Final  Culture, Respiratory w Gram Stain     Status: None   Collection Time: 10/04/20  8:24 AM   Specimen: Tracheal Aspirate; Respiratory  Result Value Ref Range Status   Specimen Description TRACHEAL ASPIRATE  Final   Special Requests NONE  Final   Gram Stain   Final    MODERATE WBC PRESENT,BOTH PMN AND MONONUCLEAR RARE SQUAMOUS EPITHELIAL CELLS PRESENT FEW GRAM POSITIVE RODS RARE BUDDING YEAST SEEN    Culture   Final    FEW Normal respiratory flora-no Staph aureus or Pseudomonas seen Performed at [facility], [place] [address], [place], [place] [address]    Report Status 10/07/2020 FINAL  Final  MRSA PCR Screening     Status: None   Collection Time: 10/04/20  8:56 AM   Specimen: Nasopharyngeal  Result Value Ref Range Status   MRSA by PCR NEGATIVE NEGATIVE Final    Comment:        The GeneXpert MRSA Assay (FDA approved for NASAL specimens only), is one component of a comprehensive MRSA colonization surveillance program. It is not intended to diagnose MRSA infection nor to guide or monitor treatment for MRSA infections. Performed at [facility], [place]  [address],  [place], [place] [address]   CSF culture w Gram Stain     Status: None   Collection Time: 10/04/20  5:01 PM   Specimen: PATH Cytology CSF; Cerebrospinal Fluid  Result Value Ref Range Status   Specimen Description CSF  Final   Special Requests NONE  Final   Gram Stain   Final    WBC PRESENT, PREDOMINANTLY MONONUCLEAR NO ORGANISMS SEEN CYTOSPIN SMEAR    Culture   Final    NO GROWTH 3 DAYS Performed at [facility], [place] [address], [place], [place] [address]    Report Status 10/08/2020 FINAL  Final  Fungus Culture With Stain     Status: None (Preliminary result)   Collection Time: 10/04/20  5:01 PM   Specimen: PATH Cytology CSF; Cerebrospinal Fluid  Result Value Ref Range Status   Fungus Stain Final report  Final    Comment: (NOTE) Performed At: [facility] [address], [place] [id number] Rush Farmer MD [id number]    Fungus (Mycology) Culture PENDING  Incomplete   Fungal Source CSF  Final    Comment: Performed at [facility], [place] [address] [place], [place] [address]  Anaerobic culture w Gram Stain     Status: None   Collection Time: 10/04/20  5:01 PM   Specimen: PATH Cytology CSF; Cerebrospinal Fluid  Result Value Ref Range Status   Specimen Description CSF  Final   Special Requests NONE  Final   Culture   Final    NO ANAEROBES ISOLATED Performed at [facility], [place] [address] [place], [place] [address]    Report Status 10/09/2020 FINAL  Final  Fungus Culture Result     Status: None   Collection Time: 10/04/20  5:01 PM  Result Value Ref Range Status   Result 1 Comment  Final    Comment: (NOTE) KOH/Calcofluor preparation:  no fungus observed. Performed At: [facility] [place], [place] [id number] Rush Farmer MD [id number]   Culture, Urine     Status: None   Collection Time: 10/18/2020  5:30 AM   Specimen: Urine, Catheterized  Result Value Ref Range Status   Specimen Description URINE,  CATHETERIZED  Final   Special Requests daptomycin, zosyn Normal  Final   Culture   Final    NO GROWTH Performed at [facility], [address] [place], [place] [address]    Report Status 10/07/2020 FINAL  Final  Fungus Culture With Stain     Status: None (Preliminary result)   Collection Time: 10/08/2020  7:13 PM   Specimen: Soft Tissue, Other  Result Value Ref Range Status   Fungus Stain Final report  Final    Comment: (NOTE) Performed At: [facility] [place], [place] [id number] Rush Farmer MD [id number]    Fungus (Mycology) Culture PENDING  Incomplete   Fungal Source TOE  Final    Comment: RT GREAT TOE SOFT TISSUE Performed at [facility], [place] [address], [place], [place] [address]   Aerobic/Anaerobic Culture w Gram Stain (surgical/deep wound)     Status: None (Preliminary result)   Collection Time: 10/27/2020  7:13 PM   Specimen: Soft Tissue, Other  Result Value Ref Range Status   Specimen Description TISSUE RIGHT TOE  Final   Special Requests SOFT TISSUE  Final   Gram Stain NO WBC SEEN NO ORGANISMS SEEN   Final   Culture   Final  NO GROWTH 4 DAYS NO ANAEROBES ISOLATED; CULTURE IN PROGRESS FOR 5 DAYS Performed at [facility], [place] [address], [place], [place] [address]    Report Status PENDING  Incomplete  Fungus Culture Result     Status: None   Collection Time: 10/10/2020  7:13 PM  Result Value Ref Range Status   Result 1 Comment  Final    Comment: (NOTE) KOH/Calcofluor preparation:  no fungus observed. Performed At: [facility] [address] [place], [place] [id number] Rush Farmer MD [id number]   Fungus Culture With Stain     Status: None (Preliminary result)   Collection Time: 10/24/2020  7:15 PM   Specimen: Soft Tissue, Other  Result Value Ref Range Status   Fungus Stain Final report  Final    Comment: (NOTE) Performed At: [facility] [place], [place] [id number] Rush Farmer MD [id number]    Fungus (Mycology) Culture PENDING  Incomplete   Fungal Source BONE  Final    Comment: RT GREAT TOE  Performed at [facility], [place] [address], [place], [place] [address]   Aerobic/Anaerobic Culture w Gram Stain (surgical/deep wound)     Status: None (Preliminary result)   Collection Time: 10/25/2020  7:15 PM   Specimen: Soft Tissue, Other  Result Value Ref Range Status   Specimen Description BONE RIGHT TOE  Final   Special Requests RT GREAT TOE BONE  Final   Gram Stain NO WBC SEEN NO ORGANISMS SEEN   Final   Culture   Final    NO GROWTH 3 DAYS NO ANAEROBES ISOLATED; CULTURE IN PROGRESS FOR 5 DAYS Performed at [facility], [place] [address], [place], [place] [address]    Report Status PENDING  Incomplete  Fungus Culture Result     Status: None   Collection Time: 10/01/2020  7:15 PM  Result Value Ref Range Status   Result 1 Comment  Final    Comment: (NOTE) KOH/Calcofluor preparation:  no fungus observed. Performed At: [facility] [place], [place] [id number] Rush Farmer MD [id number]   Culture, blood (routine x 2)     Status: None (Preliminary result)   Collection Time: 10/09/20 10:15 AM   Specimen: BLOOD  Result Value Ref Range Status   Specimen Description BLOOD RIGHT ANTECUBITAL  Final   Special Requests   Final    BOTTLES DRAWN AEROBIC ONLY Blood Culture adequate volume   Culture   Final    NO GROWTH 3 DAYS Performed at [facility], [address], [place], [place] [address]    Report Status PENDING  Incomplete  Culture, blood (routine x 2)     Status: None (Preliminary result)   Collection Time: 10/09/20 10:30 AM   Specimen: BLOOD RIGHT FOREARM  Result Value Ref Range Status   Specimen Description BLOOD RIGHT FOREARM  Final   Special Requests   Final    BOTTLES DRAWN AEROBIC ONLY Blood Culture adequate volume   Culture   Final    NO GROWTH 3 DAYS Performed at [facility], [place]  [address] [place], [place] [address]    Report Status PENDING  Incomplete    Studies/Results: MR FOOT RIGHT W WO CONTRAST  Result Date: 10/11/2020 CLINICAL DATA:  History of right great toe amputation on 10/09/2020 EXAM: MRI OF THE RIGHT FOREFOOT WITHOUT AND WITH CONTRAST TECHNIQUE: Multiplanar, multisequence MR imaging of the right forefoot was performed before and after the administration of  intravenous contrast. CONTRAST:  8.59m GADAVIST GADOBUTROL 1 MMOL/ML IV SOLN COMPARISON:  MRI 10/04/2020 FINDINGS: Bones/Joint/Cartilage Interval postsurgical changes of right great toe amputation at the level of the first MTP joint. Remaining osseous structures are intact. No bone marrow edema, erosion, or periostitis. No joint effusions. Ligaments Collateral ligaments of the lesser MTP joints are intact. Intact Lisfranc ligament. Muscles and Tendons Similar mild diffuse intramuscular edema, nonspecific. Post amputation changes of the flexor and extensor tendons of the great toe. Remaining tendinous structures intact. No tenosynovitis. Soft tissues Expected postoperative changes overlying the great toe amputation site. Mild dorsal subcutaneous edema. No soft tissue ulceration. No fluid collection. No abnormal postcontrast enhancement. IMPRESSION: 1. Interval postsurgical changes of right great toe amputation at the level of the first MTP joint. 2. No acute osseous abnormality or evidence of osteomyelitis. 3. Mild dorsal subcutaneous edema.  No fluid collection or abscess. 4. Similar mild diffuse intramuscular edema, suggesting denervation or myositis. Electronically Signed   By: NDavina PokeD.O.   On: 10/11/2020 16:02   DG Chest Port 1 View  Result Date: 10/12/2020 CLINICAL DATA:  Acute on chronic respiratory failure with hypoxia. EXAM: PORTABLE CHEST 1 VIEW patient is rotated. COMPARISON:  chest x-ray 10/11/2020, CT chest 06/22/2019 FINDINGS: Enteric tube courses below the hemidiaphragm with side port overlying the  expected region of the stomach and tip collimated off view. Right PICC line with tip overlying the expected region of the distal superior vena cava. Endotracheal tube with tip approximately 5 cm above the carina. The heart size and mediastinal contours are within normal limits. Interval improved but persistent bilateral lower lung zone airspace opacities. No pulmonary edema. Trace left pleural effusion not excluded. No right pleural effusion. No pneumothorax. No acute osseous abnormality. IMPRESSION: 1. Persistent bilateral lower lung zone airspace opacities. 2. Trace left pleural effusion not excluded. 3. Lines and tubes in stable position. Electronically Signed   By: MIven FinnM.D.   On: 10/12/2020 06:58   DG Chest Port 1 View  Result Date: 10/11/2020 CLINICAL DATA:  Acute respiratory failure with hypoxia EXAM: PORTABLE CHEST 1 VIEW COMPARISON:  Two days ago FINDINGS: Increasingly dense left lower lobe opacity obscuring the diaphragm. No edema, convincing effusion, or air leak. Endotracheal tube with tip just below the clavicular heads. The enteric tube reaches the stomach. Right PICC with tip at the SVC. IMPRESSION: 1. Worsening left lower lobe aeration. 2. Unremarkable hardware positioning. Electronically Signed   By: JMonte FantasiaM.D.   On: 10/11/2020 05:42      Assessment/Plan:  INTERVAL HISTORY:   MRI of the foot was unrevealing for any further evidence of infection     Principal Problem:   Osteomyelitis of great toe (HCC) Active Problems:   Hypertension   Dementia (HCC)   Acute renal failure superimposed on stage 3b chronic kidney disease (HCC)   Sepsis (HCC)   Altered mental status   DKA (diabetic ketoacidosis) (HCC)   Ulcer of great toe, right, with unspecified severity [place]   Unresponsive   Endotracheal tube present   Acute osteomyelitis of toe, right (HCC)   Acute on chronic respiratory failure with hypoxia (HCC)    RWOODROW DRABis a 70y.o. male with admitted  with a septic picture in the context of osteomyelitis of his right great toe.  His blood cultures have remained sterile and he is stabilized hemodynamically on daptomycin and Zosyn.  Yesterday he underwent amputation of his great toe which what sounds to be macroscopically  good margins.  Osteomyelitis:   Cultures have been unrevealing.  Margins on surgical pathology were clean.  Repeat MRI yesterday does not show evidence of residual infection in the foot.  I will plan on giving him 4 weeks of anti MRSA coverage with daptomycin (likely switch to vancomycin if he goes to a SNF as they typically will not cover daptomycin) along with ceftriaxone  This is presuming he will survive this hospitalization which is contingent on him being able to survive extubation without need for reintubation given that he does not want a tracheostomy placed.  I will sign off for now but please call us back if he has come off the ventilator and looks able to be able to continue on with antibiotics and complete his postoperative course.     LOS: 9 days   Alcide Evener 10/12/2020, 10:58 AM

## 2020-10-12 NOTE — Progress Notes (Signed)
NAME:  Tony [name], MRN:  [id number], DOB:  [birth year], LOS: 9 ADMISSION DATE:  10/07/2020, CONSULTATION DATE: 3/8 REFERRING MD: Dr. Rosine Door, CHIEF COMPLAINT: Altered mental status  Brief History:  70 yo male admitted from [facility] on 3/07 with AMS, fever (Tm 101.7), tachycardia, lactic acidosis and hyperglycemia.  Required intubation for airway protection and transferred to ICU on 3/08.  Past Medical History:  Alzheimer's dementia, BPH, CKD 3a, DM type 2, HTN, Seizure disorder, CVA  Significant Hospital Events:  3/7 admitted for sepsis/DKA 3/8 unresponsive, intubated, transferred to ICU 3/10 Right great toe amputation 3/11 Tolerate SBT. Mental status precludes extubation. Cumulative fluid balance + 3/12 Weaned off sedation. Poor mental status. Tolerating SBT 3/13 Fever  Consults:  Neurology  Procedures:  ETT 3/8 >  Significant Diagnostic Tests:   CT head 3/7 > no evidence of acute intracranial abnormality  X-ray right foot 3/7 > no gross findings suspicious of osteomyelitis  MRI right foot 3/8 > osteomyelitis of 1st distal phalanx and 1st proximal phalanx, plantar muscle edema concerning for myositis  CT head 3/8 > remote b/l cerebellar infarcts  CT abdomen pelvis 3/8 > bibasilar consolidation, heavy stool burden throughout colon, diverticulosis of descending and sigmoid colon  LP 3/8 > glucose 139, protein 113, RBC 48, WBC 6  EEG 3/8 > generalized slowing  EEG 3/10 > No evidence of epileptogenicity  Micro Data:  COVID/Flu 3/7 > negative MRSA PCR 3/7 > negative Blood culture 3/7 > Urine culture 3/7 > multiple species tracheal aspirate 3/8 > CSF 3/8 > negative Right bone/wound culture 3/10 > Blood 3/13 >>    Antimicrobials:  Cefepime 3/7 > 3/8 Vancomycin 3/7 > 3/8 Zosyn 3/8 > Daptomycin 3/8 >  Interim History / Subjective:   No sedating medicine, pain medication was given overnight MRI foot performed as above I/O+ 9.7 L total Serum  creatinine up to 1.33 <1.14 0.30, PEEP 5 Currently on pressure support   Objective   Blood pressure 117/65, pulse (!) 111, temperature 99.2 F (37.3 C), temperature source Axillary, resp. rate 15, height 6' (1.829 m), weight 82.4 kg, SpO2 98 %.    Vent Mode: PSV;CPAP FiO2 (%):  [30 %] 30 % Set Rate:  [12 bmp] 12 bmp Vt Set:  [550 mL] 550 mL PEEP:  [5 cmH20] 5 cmH20 Pressure Support:  [12 cmH20] 12 cmH20 Plateau Pressure:  [15 cmH20-24 cmH20] 15 cmH20   Intake/Output Summary (Last 24 hours) at 10/12/2020 1034 Last data filed at 10/12/2020 1000 Gross per 24 hour  Intake 1503.57 ml  Output 2516 ml  Net -1012.43 ml   Filed Weights   10/10/20 0410 10/11/20 0445 10/12/20 0455  Weight: 85.6 kg 82.1 kg 82.4 kg   Physical Exam: General: Chronically ill-appearing man, laying in bed comfortably, ventilated HENT: ET tube in place, pupils equal, oropharynx somewhat dry Respiratory: Clear bilaterally, decreased at both bases Cardiovascular: Regular, distant, no murmur GI: Nondistended, positive bowel sounds Extremities: 1-2+ pretibial edema, right great toe dressing in place without drainage Neuro: Eyes open to voice, attempts to stick out tongue on request, nodded to questions, does not move his upper or lower extremities on command, still profoundly globally weak   Resolved Hospital Problem list   Hypernatremia, DKA  Assessment & Plan:   Acute hypoxic respiratory failure with compromised airway in setting of aspiration pneumonitis. -He tolerates pressure support and we will continue to work with SBT.  Unfortunately his mental status, global weakness are barriers to a successful  extubation.  We will continue to try to limit sedating medication to see how mental status improves.  Discussed with the patient's brother [name] and the plan will be to follow him for another couple days to determine whether we are working towards a possible successful extubation (without plans for intubation)  versus a transition to comfort care.  It is clear that the patient would not want tracheostomy, PEG, prolonged ICU support or institutionalization. -Assess daily for diuretics, note slight rise in serum creatinine 3/16.  Okay to give Lasix 40 mg x 1 again today 3/16 -VAP prevention orders -Pulmonary hygiene -RASS goal 0  Sepsis from aspiration pneumonia and chronic osteomyelitis of Rt foot - improving S/p right great toe amputation 3/10 -Day 10 total antibiotics, continue daptomycin and Zosyn.  Appreciate ID input and assistance.  Continue as per ID recs -MRI of the right foot 3/15 reassuring -Continue to follow culture data -Follow chest x-ray -Postop wound care for right great toe  Acute metabolic encephalopathy from sepsis, hyperglycemia. Hx of seizures, Alzheimer dementia, CVA. -Minimize sedating medications -Continue Depakote -Continue Aricept, Namenda, Paxil  DM type 2 poorly controlled with hyperglycemia. -Levemir 45 units twice daily -Sliding scale insulin, TF coverage 7 units  AKI from ATN in setting of sepsis. Improved. Fair UOP CKD 3a. Lactic acidosis. Hx of BPH. -Follow urine output, BMP.  Dosing diuretics daily, plan for repeat Lasix 40 mg x 1 today 3/16 -Outpatient Flomax on hold  Hypernatremia -Follow BMP -Continue low-dose free water   Anemia of critical illness and chronic disease. -Following CBC -Hemoglobin transfusion threshold 7.0  Hx of HTN. -Outpatient amlodipine on hold given his relative hypotension, restart if blood pressure rising  Best practice (evaluated daily)  Diet: tube feeds DVT prophylaxis: Lovenox GI prophylaxis: PPI Glucose control: Sliding scale insulin, Lantus Mobility: Bedrest Disposition:ICU Code Status: FULL Family: Discussed status with patient's brother [name] on 3/16  Labs    CMP Latest Ref Rng & Units 10/12/2020 10/11/2020 10/10/2020  Glucose 70 - 99 mg/dL 233(H) 192(H) 214(H)  BUN 8 - 23 mg/dL 39(H) 33(H) 31(H)   Creatinine 0.61 - 1.24 mg/dL 1.33(H) 1.14 1.05  Sodium 135 - 145 mmol/L 146(H) 147(H) 145  Potassium 3.5 - 5.1 mmol/L 4.1 3.7 3.3(L)  Chloride 98 - 111 mmol/L 102 105 105  CO2 22 - 32 mmol/L 34(H) 37(H) 33(H)  Calcium 8.9 - 10.3 mg/dL 12.0(H) 11.4(H) 11.3(H)  Total Protein 6.5 - 8.1 g/dL - - -  Total Bilirubin 0.3 - 1.2 mg/dL - - -  Alkaline Phos 38 - 126 U/L - - -  AST 15 - 41 U/L - - -  ALT 0 - 44 U/L - - -    CBC Latest Ref Rng & Units 10/12/2020 10/11/2020 10/10/2020  WBC 4.0 - 10.5 K/uL 19.0(H) 18.5(H) 18.5(H)  Hemoglobin 13.0 - 17.0 g/dL 9.3(L) 8.2(L) 9.2(L)  Hematocrit 39.0 - 52.0 % 30.7(L) 26.8(L) 29.8(L)  Platelets 150 - 400 K/uL 500(H) 380 322    ABG    Component Value Date/Time   PHART 7.529 (H) 10/04/2020 0923   PCO2ART 32.1 10/04/2020 0923   PO2ART 174 (H) 10/04/2020 0923   HCO3 26.4 10/04/2020 0923   TCO2 27 10/04/2020 0923   ACIDBASEDEF 3.7 (H) 02/07/2010 1730   O2SAT 100.0 10/04/2020 0923    CBG (last 3)  Recent Labs    10/11/20 2343 10/12/20 0331 10/12/20 0749  GLUCAP 164* 192* 219*    Critical care time: 32 minutes   The patient is critically  ill with multiple organ systems failure and requires high complexity decision making for assessment and support, frequent evaluation and titration of therapies, application of advanced monitoring technologies and extensive interpretation of multiple databases.  Independent Critical Care Time: 32 Minutes.   Baltazar Apo, MD, PhD 10/12/2020, 10:34 AM [facility] [phone] or if no answer before 7:00PM call [phone] For any issues after 7:00PM please call eLink [phone]

## 2020-10-13 DIAGNOSIS — J9621 Acute and chronic respiratory failure with hypoxia: Secondary | ICD-10-CM | POA: Diagnosis not present

## 2020-10-13 DIAGNOSIS — M869 Osteomyelitis, unspecified: Secondary | ICD-10-CM | POA: Diagnosis not present

## 2020-10-13 LAB — BASIC METABOLIC PANEL
Anion gap: 8 (ref 5–15)
BUN: 39 mg/dL — ABNORMAL HIGH (ref 8–23)
CO2: 37 mmol/L — ABNORMAL HIGH (ref 22–32)
Calcium: 11.8 mg/dL — ABNORMAL HIGH (ref 8.9–10.3)
Chloride: 100 mmol/L (ref 98–111)
Creatinine, Ser: 1.21 mg/dL (ref 0.61–1.24)
GFR, Estimated: >60 mL/min (ref >60)
Glucose, Bld: 202 mg/dL — ABNORMAL HIGH (ref 70–99)
Potassium: 3.9 mmol/L (ref 3.5–5.1)
Sodium: 145 mmol/L (ref 135–145)

## 2020-10-13 LAB — CBC
HCT: 26.7 % — ABNORMAL LOW (ref 39.0–52.0)
Hemoglobin: 8.3 g/dL — ABNORMAL LOW (ref 13.0–17.0)
MCH: 32.2 pg (ref 26.0–34.0)
MCHC: 31.1 g/dL (ref 30.0–36.0)
MCV: 103.5 fL — ABNORMAL HIGH (ref 80.0–100.0)
Platelets: 627 10*3/uL — ABNORMAL HIGH (ref 150–400)
RBC: 2.58 MIL/uL — ABNORMAL LOW (ref 4.22–5.81)
RDW: 13.7 % (ref 11.5–15.5)
WBC: 22.8 10*3/uL — ABNORMAL HIGH (ref 4.0–10.5)
nRBC: 0.1 % (ref 0.0–0.2)

## 2020-10-13 LAB — GLUCOSE, CAPILLARY
Glucose-Capillary: 169 mg/dL — ABNORMAL HIGH (ref 70–99)
Glucose-Capillary: 168 mg/dL — ABNORMAL HIGH (ref 70–99)
Glucose-Capillary: 143 mg/dL — ABNORMAL HIGH (ref 70–99)
Glucose-Capillary: 182 mg/dL — ABNORMAL HIGH (ref 70–99)
Glucose-Capillary: 176 mg/dL — ABNORMAL HIGH (ref 70–99)
Glucose-Capillary: 166 mg/dL — ABNORMAL HIGH (ref 70–99)

## 2020-10-13 LAB — MAGNESIUM: Magnesium: 2.1 mg/dL (ref 1.7–2.4)

## 2020-10-13 MED ORDER — AMLODIPINE BESYLATE 5 MG PO TABS
2.5000 mg | ORAL_TABLET | Freq: Every day | ORAL | Status: DC
  Administered 2020-10-13: 2.5 mg via ORAL
  Filled 2020-10-13: qty 1

## 2020-10-13 MED ORDER — INSULIN ASPART 100 UNIT/ML ~~LOC~~ SOLN
8.0000 [IU] | SUBCUTANEOUS | Status: DC
  Administered 2020-10-13 – 2020-10-15 (×11): 8 [IU] via SUBCUTANEOUS

## 2020-10-13 MED ORDER — AMLODIPINE BESYLATE 5 MG PO TABS: 2.5000 mg | ORAL_TABLET | Freq: Every day | ORAL | Status: DC

## 2020-10-13 MED ORDER — FUROSEMIDE 10 MG/ML IJ SOLN
40.0000 mg | Freq: Two times a day (BID) | INTRAMUSCULAR | Status: AC
  Administered 2020-10-13 (×2): 40 mg via INTRAVENOUS
  Filled 2020-10-13 (×2): qty 4

## 2020-10-13 MED ORDER — INSULIN DETEMIR 100 UNIT/ML ~~LOC~~ SOLN
50.0000 [IU] | Freq: Two times a day (BID) | SUBCUTANEOUS | Status: DC
  Administered 2020-10-13 – 2020-10-15 (×4): 50 [IU] via SUBCUTANEOUS
  Filled 2020-10-13 (×6): qty 0.5

## 2020-10-13 MED ORDER — FUROSEMIDE 10 MG/ML IJ SOLN: 40.0000 mg | Freq: Once | INTRAMUSCULAR | Status: DC

## 2020-10-13 NOTE — Progress Notes (Signed)
NAME:  Tony [name], MRN:  [id number], DOB:  May 17, 1951, LOS: 65 ADMISSION DATE:  10/10/2020, CONSULTATION DATE: 3/8 REFERRING MD: Dr. Rosine Door, CHIEF COMPLAINT: Altered mental status  Brief History:  70 yo male admitted from [facility] on 3/07 with AMS, fever (Tm 101.7), tachycardia, lactic acidosis and hyperglycemia.  Required intubation for airway protection and transferred to ICU on 3/08.  Past Medical History:  Alzheimer's dementia, BPH, CKD 3a, DM type 2, HTN, Seizure disorder, CVA  Significant Hospital Events:  3/7 admitted for sepsis/DKA 3/8 unresponsive, intubated, transferred to ICU 3/10 Right great toe amputation 3/11 Tolerate SBT. Mental status precludes extubation. Cumulative fluid balance + 3/12 Weaned off sedation. Poor mental status. Tolerating SBT 3/13 Fever  Consults:  Neurology  Procedures:  ETT 3/8 >  Significant Diagnostic Tests:   CT head 3/7 > no evidence of acute intracranial abnormality  X-ray right foot 3/7 > no gross findings suspicious of osteomyelitis  MRI right foot 3/8 > osteomyelitis of 1st distal phalanx and 1st proximal phalanx, plantar muscle edema concerning for myositis  CT head 3/8 > remote b/l cerebellar infarcts  CT abdomen pelvis 3/8 > bibasilar consolidation, heavy stool burden throughout colon, diverticulosis of descending and sigmoid colon  LP 3/8 > glucose 139, protein 113, RBC 48, WBC 6  EEG 3/8 > generalized slowing  EEG 3/10 > No evidence of epileptogenicity  MRI R foot 3/15 >> no apparent osteo-, no abscess  Micro Data:  COVID/Flu 3/7 > negative MRSA PCR 3/7 > negative Blood culture 3/7 > Urine culture 3/7 > multiple species tracheal aspirate 3/8 > CSF 3/8 > negative Right bone/wound culture 3/10 > negative Blood 3/13 >>    Antimicrobials:  Cefepime 3/7 > 3/8 Vancomycin 3/7 > 3/8 Zosyn 3/8 > 3/16 Daptomycin 3/8 > 3/16 Vancomycin 3/16 > Ceftriaxone 3/16 >   Interim History / Subjective:   Little  change in mental status, still awake but profoundly weak He did not get any fentanyl overnight Tolerating PSV   Objective   Blood pressure 133/68, pulse (!) 108, temperature 100.3 F (37.9 C), temperature source Axillary, resp. rate 20, height 6' (1.829 m), weight 83 kg, SpO2 100 %.    Vent Mode: PSV;CPAP FiO2 (%):  [30 %] 30 % Set Rate:  [12 bmp] 12 bmp Vt Set:  [50 mL-550 mL] 50 mL PEEP:  [5 cmH20] 5 cmH20 Pressure Support:  [5 JGG83-66 cmH20] 5 cmH20 Plateau Pressure:  [13 cmH20] 13 cmH20   Intake/Output Summary (Last 24 hours) at 10/13/2020 0942 Last data filed at 10/13/2020 0900 Gross per 24 hour  Intake 2330.16 ml  Output 1992 ml  Net 338.16 ml   Filed Weights   10/11/20 0445 10/12/20 0455 10/13/20 0433  Weight: 82.1 kg 82.4 kg 83 kg   Physical Exam: General: Chronically ill-appearing man, laying in bed comfortably, ventilated HENT: ET tube in place, pupils equal, oropharynx somewhat dry Respiratory: Clear bilaterally, decreased at both bases Cardiovascular: Regular, distant, no murmur GI: Nondistended, positive bowel sounds Extremities: 1+ pretibial edema.  Right toe dressing intact without drainage Neuro: Opens eyes and tracks.  Nodded to questions, attempted to stick out his tongue.  Did not move his upper or lower extremities on command.  Profoundly globally weak.  Spontaneous dry debris is present   Resolved Hospital Problem list   Hypernatremia, DKA  Assessment & Plan:   Acute hypoxic respiratory failure with compromised airway in setting of aspiration pneumonitis. -Tolerates pressure support but still profoundly weak.  Airway protection and mental status will likely be issues.  Goal is to work for successful extubation in the next few days but with no plans for reintubation.  If he were to fail then we will transition to comfort.  Alternatively if we do not believe he can be extubated successfully we may transition to comfort from the start.  Have discussed this  with his brother [name] who is his healthcare spokesperson.  [name] supports this plan, notes that the patient would not want tracheostomy, PEG, institutionalization, etc. -Dosing his diuretics daily, has tolerated over the last several days.  Plan to repeat Lasix 40 mg x2 on  3/17 and follow -VAP prevention orders -RASS goal 0 -Pulmonary hygiene  Sepsis from aspiration pneumonia and chronic osteomyelitis of Rt foot - improving S/p right great toe amputation 3/10 -Day 11 total antibiotics.  Appreciate ID recommendations.  Changed to ceftriaxone and vancomycin on 3/16, planning for 4 weeks total therapy -MRI of the right foot 3/15 reassuring -Continue to follow blood cultures, tissue culture from his toe resection is negative -Follow intermittent chest x-ray -Postop wound care for the right great toe  Acute metabolic encephalopathy from sepsis, hyperglycemia. Hx of seizures, Alzheimer dementia, CVA. -Continue to minimize sedating medication -Continue Depakote -Continue Aricept, Namenda, Paxil  DM type 2 poorly controlled with hyperglycemia. -Levemir increased to 50 units twice daily on 3/17 -Sliding scale insulin, + TF coverage increased to 8 units on 3/17  AKI from ATN in setting of sepsis. Improved. Fair UOP CKD 3a. Lactic acidosis. Hx of BPH. -Continue to follow urine output, BMP.  Dosing diuretics daily as above.  Plan to repeat his Lasix 40 mg x 2 on 3/17 -Outpatient Flomax on hold  Hypernatremia -Follow BMP -Continue low-dose free water   Anemia of critical illness and chronic disease. -Continue to follow CBC -Hemoglobin transfusion threshold 7.0  Hx of HTN. -Restart low-dose home amlodipine 3/17  Best practice (evaluated daily)  Diet: tube feeds DVT prophylaxis: Lovenox GI prophylaxis: PPI Glucose control: Sliding scale insulin, Lantus Mobility: Bedrest Disposition:ICU Code Status: FULL Family: Discussed status with patient's brother [name] on 3/17  Labs    CMP  Latest Ref Rng & Units 10/13/2020 10/12/2020 10/11/2020  Glucose 70 - 99 mg/dL 202(H) 233(H) 192(H)  BUN 8 - 23 mg/dL 39(H) 39(H) 33(H)  Creatinine 0.61 - 1.24 mg/dL 1.21 1.33(H) 1.14  Sodium 135 - 145 mmol/L 145 146(H) 147(H)  Potassium 3.5 - 5.1 mmol/L 3.9 4.1 3.7  Chloride 98 - 111 mmol/L 100 102 105  CO2 22 - 32 mmol/L 37(H) 34(H) 37(H)  Calcium 8.9 - 10.3 mg/dL 11.8(H) 12.0(H) 11.4(H)  Total Protein 6.5 - 8.1 g/dL - - -  Total Bilirubin 0.3 - 1.2 mg/dL - - -  Alkaline Phos 38 - 126 U/L - - -  AST 15 - 41 U/L - - -  ALT 0 - 44 U/L - - -    CBC Latest Ref Rng & Units 10/13/2020 10/12/2020 10/11/2020  WBC 4.0 - 10.5 K/uL 22.8(H) 19.0(H) 18.5(H)  Hemoglobin 13.0 - 17.0 g/dL 8.3(L) 9.3(L) 8.2(L)  Hematocrit 39.0 - 52.0 % 26.7(L) 30.7(L) 26.8(L)  Platelets 150 - 400 K/uL 627(H) 500(H) 380    ABG    Component Value Date/Time   PHART 7.529 (H) 10/04/2020 0923   PCO2ART 32.1 10/04/2020 0923   PO2ART 174 (H) 10/04/2020 0923   HCO3 26.4 10/04/2020 0923   TCO2 27 10/04/2020 0923   ACIDBASEDEF 3.7 (H) 02/07/2010 1730   O2SAT 100.0  10/04/2020 0923    CBG (last 3)  Recent Labs    10/12/20 2317 10/13/20 0353 10/13/20 0810  GLUCAP 192* 169* 168*    Critical care time: 31 minutes   The patient is critically ill with multiple organ systems failure and requires high complexity decision making for assessment and support, frequent evaluation and titration of therapies, application of advanced monitoring technologies and extensive interpretation of multiple databases.  Independent Critical Care Time: 31 Minutes.   Baltazar Apo, MD, PhD 10/13/2020, 9:42 AM [facility] [phone] or if no answer before 7:00PM call [phone] For any issues after 7:00PM please call eLink [phone]          NAME:  Tony [name], MRN:  [id number], DOB:  June 30, 1951, LOS: 10 ADMISSION DATE:  10/15/2020, CONSULTATION DATE: 3/8 REFERRING MD: Dr. Rosine Door, CHIEF COMPLAINT:  Altered mental status  Brief History:  70 yo male admitted from [facility] on 3/07 with AMS, fever (Tm 101.7), tachycardia, lactic acidosis and hyperglycemia.  Required intubation for airway protection and transferred to ICU on 3/08.  Past Medical History:  Alzheimer's dementia, BPH, CKD 3a, DM type 2, HTN, Seizure disorder, CVA  Significant Hospital Events:  3/7 admitted for sepsis/DKA 3/8 unresponsive, intubated, transferred to ICU 3/10 Right great toe amputation 3/11 Tolerate SBT. Mental status precludes extubation. Cumulative fluid balance + 3/12 Weaned off sedation. Poor mental status. Tolerating SBT 3/13 Fever  Consults:  Neurology  Procedures:  ETT 3/8 >  Significant Diagnostic Tests:   CT head 3/7 > no evidence of acute intracranial abnormality  X-ray right foot 3/7 > no gross findings suspicious of osteomyelitis  MRI right foot 3/8 > osteomyelitis of 1st distal phalanx and 1st proximal phalanx, plantar muscle edema concerning for myositis  CT head 3/8 > remote b/l cerebellar infarcts  CT abdomen pelvis 3/8 > bibasilar consolidation, heavy stool burden throughout colon, diverticulosis of descending and sigmoid colon  LP 3/8 > glucose 139, protein 113, RBC 48, WBC 6  EEG 3/8 > generalized slowing  EEG 3/10 > No evidence of epileptogenicity  Micro Data:  COVID/Flu 3/7 > negative MRSA PCR 3/7 > negative Blood culture 3/7 > Urine culture 3/7 > multiple species tracheal aspirate 3/8 > CSF 3/8 > negative Right bone/wound culture 3/10 > Blood 3/13 >>    Antimicrobials:  Cefepime 3/7 > 3/8 Vancomycin 3/7 > 3/8 Zosyn 3/8 > Daptomycin 3/8 >  Interim History / Subjective:   No sedating medicine, pain medication was given overnight MRI foot performed as above I/O+ 9.7 L total Serum creatinine up to 1.33 <1.14 0.30, PEEP 5 Currently on pressure support   Objective   Blood pressure 133/68, pulse (!) 108, temperature 100.3 F (37.9 C), temperature  source Axillary, resp. rate 20, height 6' (1.829 m), weight 83 kg, SpO2 100 %.    Vent Mode: PSV;CPAP FiO2 (%):  [30 %] 30 % Set Rate:  [12 bmp] 12 bmp Vt Set:  [50 mL-550 mL] 50 mL PEEP:  [5 cmH20] 5 cmH20 Pressure Support:  [5 MAU63-33 cmH20] 5 cmH20 Plateau Pressure:  [13 cmH20] 13 cmH20   Intake/Output Summary (Last 24 hours) at 10/13/2020 0942 Last data filed at 10/13/2020 0900 Gross per 24 hour  Intake 2330.16 ml  Output 1992 ml  Net 338.16 ml   Filed Weights   10/11/20 0445 10/12/20 0455 10/13/20 0433  Weight: 82.1 kg 82.4 kg 83 kg   Physical Exam: General: Chronically ill-appearing man, laying in bed comfortably,  ventilated HENT: ET tube in place, pupils equal, oropharynx somewhat dry Respiratory: Clear bilaterally, decreased at both bases Cardiovascular: Regular, distant, no murmur GI: Nondistended, positive bowel sounds Extremities: 1-2+ pretibial edema, right great toe dressing in place without drainage Neuro: Eyes open to voice, attempts to stick out tongue on request, nodded to questions, does not move his upper or lower extremities on command, still profoundly globally weak   Resolved Hospital Problem list   Hypernatremia, DKA  Assessment & Plan:   Acute hypoxic respiratory failure with compromised airway in setting of aspiration pneumonitis. -He tolerates pressure support and we will continue to work with SBT.  Unfortunately his mental status, global weakness are barriers to a successful extubation.  We will continue to try to limit sedating medication to see how mental status improves.  Discussed with the patient's brother [name] and the plan will be to follow him for another couple days to determine whether we are working towards a possible successful extubation (without plans for intubation) versus a transition to comfort care.  It is clear that the patient would not want tracheostomy, PEG, prolonged ICU support or institutionalization. -Assess daily for diuretics,  note slight rise in serum creatinine 3/16.  Okay to give Lasix 40 mg x 1 again today 3/16 -VAP prevention orders -Pulmonary hygiene -RASS goal 0  Sepsis from aspiration pneumonia and chronic osteomyelitis of Rt foot - improving S/p right great toe amputation 3/10 -Day 10 total antibiotics, continue daptomycin and Zosyn.  Appreciate ID input and assistance.  Continue as per ID recs -MRI of the right foot 3/15 reassuring -Continue to follow culture data -Follow chest x-ray -Postop wound care for right great toe  Acute metabolic encephalopathy from sepsis, hyperglycemia. Hx of seizures, Alzheimer dementia, CVA. -Minimize sedating medications -Continue Depakote -Continue Aricept, Namenda, Paxil  DM type 2 poorly controlled with hyperglycemia. -Levemir 45 units twice daily -Sliding scale insulin, TF coverage 7 units  AKI from ATN in setting of sepsis. Improved. Fair UOP CKD 3a. Lactic acidosis. Hx of BPH. -Follow urine output, BMP.  Dosing diuretics daily, plan for repeat Lasix 40 mg x 1 today 3/16 -Outpatient Flomax on hold  Hypernatremia -Follow BMP -Continue low-dose free water   Anemia of critical illness and chronic disease. -Following CBC -Hemoglobin transfusion threshold 7.0  Hx of HTN. -Outpatient amlodipine on hold given his relative hypotension, restart if blood pressure rising  Best practice (evaluated daily)  Diet: tube feeds DVT prophylaxis: Lovenox GI prophylaxis: PPI Glucose control: Sliding scale insulin, Lantus Mobility: Bedrest Disposition:ICU Code Status: FULL Family: Discussed status with patient's brother [name] on 3/16  Labs    CMP Latest Ref Rng & Units 10/13/2020 10/12/2020 10/11/2020  Glucose 70 - 99 mg/dL 202(H) 233(H) 192(H)  BUN 8 - 23 mg/dL 39(H) 39(H) 33(H)  Creatinine 0.61 - 1.24 mg/dL 1.21 1.33(H) 1.14  Sodium 135 - 145 mmol/L 145 146(H) 147(H)  Potassium 3.5 - 5.1 mmol/L 3.9 4.1 3.7  Chloride 98 - 111 mmol/L 100 102 105  CO2 22 - 32  mmol/L 37(H) 34(H) 37(H)  Calcium 8.9 - 10.3 mg/dL 11.8(H) 12.0(H) 11.4(H)  Total Protein 6.5 - 8.1 g/dL - - -  Total Bilirubin 0.3 - 1.2 mg/dL - - -  Alkaline Phos 38 - 126 U/L - - -  AST 15 - 41 U/L - - -  ALT 0 - 44 U/L - - -    CBC Latest Ref Rng & Units 10/13/2020 10/12/2020 10/11/2020  WBC 4.0 - 10.5 K/uL 22.8(H)  19.0(H) 18.5(H)  Hemoglobin 13.0 - 17.0 g/dL 8.3(L) 9.3(L) 8.2(L)  Hematocrit 39.0 - 52.0 % 26.7(L) 30.7(L) 26.8(L)  Platelets 150 - 400 K/uL 627(H) 500(H) 380    ABG    Component Value Date/Time   PHART 7.529 (H) 10/04/2020 0923   PCO2ART 32.1 10/04/2020 0923   PO2ART 174 (H) 10/04/2020 0923   HCO3 26.4 10/04/2020 0923   TCO2 27 10/04/2020 0923   ACIDBASEDEF 3.7 (H) 02/07/2010 1730   O2SAT 100.0 10/04/2020 0923    CBG (last 3)  Recent Labs    10/12/20 2317 10/13/20 0353 10/13/20 0810  GLUCAP 192* 169* 168*    Critical care time: 32 minutes   The patient is critically ill with multiple organ systems failure and requires high complexity decision making for assessment and support, frequent evaluation and titration of therapies, application of advanced monitoring technologies and extensive interpretation of multiple databases.  Independent Critical Care Time: 32 Minutes.   Baltazar Apo, MD, PhD 10/13/2020, 9:42 AM [facility] [phone] or if no answer before 7:00PM call [phone] For any issues after 7:00PM please call eLink [phone]

## 2020-10-14 DIAGNOSIS — M86171 Other acute osteomyelitis, right ankle and foot: Secondary | ICD-10-CM | POA: Diagnosis not present

## 2020-10-14 DIAGNOSIS — J9621 Acute and chronic respiratory failure with hypoxia: Secondary | ICD-10-CM | POA: Diagnosis not present

## 2020-10-14 DIAGNOSIS — J690 Pneumonitis due to inhalation of food and vomit: Secondary | ICD-10-CM | POA: Diagnosis not present

## 2020-10-14 LAB — BASIC METABOLIC PANEL
Anion gap: 7 (ref 5–15)
BUN: 41 mg/dL — ABNORMAL HIGH (ref 8–23)
CO2: 37 mmol/L — ABNORMAL HIGH (ref 22–32)
Calcium: 12 mg/dL — ABNORMAL HIGH (ref 8.9–10.3)
Chloride: 101 mmol/L (ref 98–111)
Creatinine, Ser: 1.25 mg/dL — ABNORMAL HIGH (ref 0.61–1.24)
GFR, Estimated: >60 mL/min (ref >60)
Glucose, Bld: 191 mg/dL — ABNORMAL HIGH (ref 70–99)
Potassium: 3.8 mmol/L (ref 3.5–5.1)
Sodium: 145 mmol/L (ref 135–145)

## 2020-10-14 LAB — GLUCOSE, CAPILLARY
Glucose-Capillary: 168 mg/dL — ABNORMAL HIGH (ref 70–99)
Glucose-Capillary: 143 mg/dL — ABNORMAL HIGH (ref 70–99)
Glucose-Capillary: 137 mg/dL — ABNORMAL HIGH (ref 70–99)
Glucose-Capillary: 97 mg/dL (ref 70–99)
Glucose-Capillary: 117 mg/dL — ABNORMAL HIGH (ref 70–99)
Glucose-Capillary: 172 mg/dL — ABNORMAL HIGH (ref 70–99)

## 2020-10-14 LAB — CBC
HCT: 26.8 % — ABNORMAL LOW (ref 39.0–52.0)
Hemoglobin: 8 g/dL — ABNORMAL LOW (ref 13.0–17.0)
MCH: 31.3 pg (ref 26.0–34.0)
MCHC: 29.9 g/dL — ABNORMAL LOW (ref 30.0–36.0)
MCV: 104.7 fL — ABNORMAL HIGH (ref 80.0–100.0)
Platelets: 675 10*3/uL — ABNORMAL HIGH (ref 150–400)
RBC: 2.56 MIL/uL — ABNORMAL LOW (ref 4.22–5.81)
RDW: 13.9 % (ref 11.5–15.5)
WBC: 24.4 10*3/uL — ABNORMAL HIGH (ref 4.0–10.5)
nRBC: 0.1 % (ref 0.0–0.2)

## 2020-10-14 LAB — CULTURE, BLOOD (ROUTINE X 2)
Culture: NO GROWTH
Culture: NO GROWTH
Special Requests: ADEQUATE
Special Requests: ADEQUATE

## 2020-10-14 LAB — PHOSPHORUS: Phosphorus: 3.7 mg/dL (ref 2.5–4.6)

## 2020-10-14 LAB — MAGNESIUM: Magnesium: 2.5 mg/dL — ABNORMAL HIGH (ref 1.7–2.4)

## 2020-10-14 MED ORDER — AMLODIPINE BESYLATE 5 MG PO TABS
5.0000 mg | ORAL_TABLET | Freq: Every day | ORAL | Status: DC
  Administered 2020-10-14 – 2020-10-15 (×2): 5 mg
  Filled 2020-10-14 (×2): qty 1

## 2020-10-14 NOTE — Progress Notes (Signed)
Spoke with pt's brother, [name].  Plan is that family will gather in AM of 10/15/20.  His medical status appears optimized as best as able to proceed with extubation, and will plan to do this on 10/15/20 around noon.  Confirmed the plan that he should not be reintubated if he is unable to sustain himself after extubation.  Also, noted to have urine retention with bladder scan showing 826 ml in bladder.  Will have foley placed.  [name] informed us that Taseen recent had treatment with urology for BPH and had foley in for about 2 months.  Chesley Mires, MD [place] Pager - [phone] 10/14/2020, 1:55 PM

## 2020-10-14 NOTE — Progress Notes (Signed)
NAME:  Tony [name], MRN:  [id number], DOB:  1951/01/13, LOS: 70 ADMISSION DATE:  10/22/2020, CONSULTATION DATE: 3/8 REFERRING MD: Dr. Rosine Door, CHIEF COMPLAINT: Altered mental status  Brief History:  70 yo male admitted from [facility] on 3/07 with AMS, fever (Tm 101.7), tachycardia, lactic acidosis and hyperglycemia.  Required intubation for airway protection and transferred to ICU on 3/08.  Past Medical History:  Alzheimer's dementia, BPH, CKD 3a, DM type 2, HTN, Seizure disorder, CVA  Significant Hospital Events:  3/7 admitted for sepsis/DKA 3/8 unresponsive, intubated, transferred to ICU 3/10 Right great toe amputation 3/11 Tolerate SBT. Mental status precludes extubation. Cumulative fluid balance + 3/12 Weaned off sedation. Poor mental status. Tolerating SBT 3/13 Fever  Consults:  Neurology  Procedures:  ETT 3/8 >  Significant Diagnostic Tests:   CT head 3/7 > no evidence of acute intracranial abnormality  X-ray right foot 3/7 > no gross findings suspicious of osteomyelitis  MRI right foot 3/8 > osteomyelitis of 1st distal phalanx and 1st proximal phalanx, plantar muscle edema concerning for myositis  CT head 3/8 > remote b/l cerebellar infarcts  CT abdomen pelvis 3/8 > bibasilar consolidation, heavy stool burden throughout colon, diverticulosis of descending and sigmoid colon  LP 3/8 > glucose 139, protein 113, RBC 48, WBC 6  EEG 3/8 > generalized slowing  EEG 3/10 > No evidence of epileptogenicity  MRI R foot 3/15 >> no apparent osteo-, no abscess  Micro Data:  COVID/Flu 3/7 > negative MRSA PCR 3/7 > negative Blood culture 3/7 > Urine culture 3/7 > multiple species tracheal aspirate 3/8 > CSF 3/8 > negative Right bone/wound culture 3/10 > negative Blood 3/13 >>   Antimicrobials:  Cefepime 3/7 > 3/8 Vancomycin 3/7 > 3/8 Zosyn 3/8 > 3/16 Daptomycin 3/8 > 3/16 Vancomycin 3/16 > Ceftriaxone 3/16 >   Interim History / Subjective:  Low RR with  SBT.    Objective   Blood pressure (!) 152/138, pulse (!) 110, temperature 99.2 F (37.3 C), temperature source Oral, resp. rate 13, height 6' (1.829 m), weight 83 kg, SpO2 100 %.    Vent Mode: PRVC FiO2 (%):  [30 %] 30 % Set Rate:  [12 bmp] 12 bmp Vt Set:  [550 mL] 550 mL PEEP:  [5 cmH20] 5 cmH20 Pressure Support:  [5 cmH20] 5 cmH20 Plateau Pressure:  [13 cmH20-15 cmH20] 15 cmH20   Intake/Output Summary (Last 24 hours) at 10/14/2020 7793 Last data filed at 10/14/2020 0600 Gross per 24 hour  Intake 2400.21 ml  Output 1785 ml  Net 615.21 ml   Filed Weights   10/11/20 0445 10/12/20 0455 10/13/20 0433  Weight: 82.1 kg 82.4 kg 83 kg   Physical Exam:  General - somnolent Eyes - pupils reactive ENT - ETT in place Cardiac - regular rate/rhythm, no murmur Chest - equal breath sounds b/l, no wheezing or rales Abdomen - soft, non tender, + bowel sounds Extremities - 1+ edema, Rt foot bandage clean Skin - no rashes Neuro - follows simple commands, very deconditioned   Resolved Hospital Problem list   Hypernatremia, DKA, AKI from ATN secondary to sepsis, Lactic acidosis  Assessment & Plan:   Acute hypoxic respiratory failure with compromised airway in setting of aspiration pneumonitis. - tolerates some pressure support, but deconditioning barrier to extubation - family would not want trach/long term vent - goal is for extubation when ready, but no reintubation after; if we can not reach this goal, then family would prefer transition to  comfort measures - f/u CXR intermitently  Sepsis from aspiration pneumonia and chronic osteomyelitis of Rt foot s/p right great toe amputation 3/10 - day 12 of ABx, currently on rocephin/vancomycin per ID; plan for 4 weeks total of ABx - last seen by ID on 3/16 - f/u blood culture from 6/29  Acute metabolic encephalopathy from sepsis, hyperglycemia. Hx of seizures, Alzheimer dementia, CVA. - RASS goal 0 to -1 -Continue Depakote, Aricept,  Namenda, Paxil  DM type 2 poorly controlled with hyperglycemia. - SSI with 8 units TF coverage and 50 units levemir bid  CKD 3a. Hx of BPH. - f/u BMET - hold outpt flomax (can't give per tube)  Anemia of critical illness and chronic disease. - f/u CBC - transfuse for Hb < 7 or significant bleeding - check iron levels  Hx of HTN. - change norvasc to 5 mg daily  Best practice (evaluated daily)  Diet: tube feeds DVT prophylaxis: Lovenox GI prophylaxis: Protonix Mobility: Bedrest Disposition:ICU Code Status: FULL  Labs    CMP Latest Ref Rng & Units 10/14/2020 10/13/2020 10/12/2020  Glucose 70 - 99 mg/dL 191(H) 202(H) 233(H)  BUN 8 - 23 mg/dL 41(H) 39(H) 39(H)  Creatinine 0.61 - 1.24 mg/dL 1.25(H) 1.21 1.33(H)  Sodium 135 - 145 mmol/L 145 145 146(H)  Potassium 3.5 - 5.1 mmol/L 3.8 3.9 4.1  Chloride 98 - 111 mmol/L 101 100 102  CO2 22 - 32 mmol/L 37(H) 37(H) 34(H)  Calcium 8.9 - 10.3 mg/dL 12.0(H) 11.8(H) 12.0(H)  Total Protein 6.5 - 8.1 g/dL - - -  Total Bilirubin 0.3 - 1.2 mg/dL - - -  Alkaline Phos 38 - 126 U/L - - -  AST 15 - 41 U/L - - -  ALT 0 - 44 U/L - - -    CBC Latest Ref Rng & Units 10/14/2020 10/13/2020 10/12/2020  WBC 4.0 - 10.5 K/uL 24.4(H) 22.8(H) 19.0(H)  Hemoglobin 13.0 - 17.0 g/dL 8.0(L) 8.3(L) 9.3(L)  Hematocrit 39.0 - 52.0 % 26.8(L) 26.7(L) 30.7(L)  Platelets 150 - 400 K/uL 675(H) 627(H) 500(H)    ABG    Component Value Date/Time   PHART 7.529 (H) 10/04/2020 0923   PCO2ART 32.1 10/04/2020 0923   PO2ART 174 (H) 10/04/2020 0923   HCO3 26.4 10/04/2020 0923   TCO2 27 10/04/2020 0923   ACIDBASEDEF 3.7 (H) 02/07/2010 1730   O2SAT 100.0 10/04/2020 0923    CBG (last 3)  Recent Labs    10/13/20 2323 10/14/20 0340 10/14/20 0801  GLUCAP 166* 168* 143*    Critical care time: 32 minutes  Chesley Mires, MD [place] Pager - [phone] 10/14/2020, 8:19 AM

## 2020-10-15 ENCOUNTER — Inpatient Hospital Stay (HOSPITAL_COMMUNITY): Payer: Medicare (Managed Care)

## 2020-10-15 DIAGNOSIS — J9621 Acute and chronic respiratory failure with hypoxia: Secondary | ICD-10-CM | POA: Diagnosis not present

## 2020-10-15 DIAGNOSIS — M869 Osteomyelitis, unspecified: Secondary | ICD-10-CM | POA: Diagnosis not present

## 2020-10-15 LAB — CBC
HCT: 24.2 % — ABNORMAL LOW (ref 39.0–52.0)
Hemoglobin: 7.5 g/dL — ABNORMAL LOW (ref 13.0–17.0)
MCH: 31.9 pg (ref 26.0–34.0)
MCHC: 31 g/dL (ref 30.0–36.0)
MCV: 103 fL — ABNORMAL HIGH (ref 80.0–100.0)
Platelets: 709 10*3/uL — ABNORMAL HIGH (ref 150–400)
RBC: 2.35 MIL/uL — ABNORMAL LOW (ref 4.22–5.81)
RDW: 14.3 % (ref 11.5–15.5)
WBC: 23.2 10*3/uL — ABNORMAL HIGH (ref 4.0–10.5)
nRBC: 0.2 % (ref 0.0–0.2)

## 2020-10-15 LAB — IRON AND TIBC
Iron: 32 ug/dL — ABNORMAL LOW (ref 45–182)
Saturation Ratios: 12 % — ABNORMAL LOW (ref 17.9–39.5)
TIBC: 262 ug/dL (ref 250–450)
UIBC: 230 ug/dL

## 2020-10-15 LAB — GLUCOSE, CAPILLARY
Glucose-Capillary: 101 mg/dL — ABNORMAL HIGH (ref 70–99)
Glucose-Capillary: 105 mg/dL — ABNORMAL HIGH (ref 70–99)
Glucose-Capillary: 186 mg/dL — ABNORMAL HIGH (ref 70–99)
Glucose-Capillary: 190 mg/dL — ABNORMAL HIGH (ref 70–99)
Glucose-Capillary: 197 mg/dL — ABNORMAL HIGH (ref 70–99)
Glucose-Capillary: 37 mg/dL — CL (ref 70–99)
Glucose-Capillary: 60 mg/dL — ABNORMAL LOW (ref 70–99)
Glucose-Capillary: 74 mg/dL (ref 70–99)

## 2020-10-15 LAB — BASIC METABOLIC PANEL
Anion gap: 7 (ref 5–15)
BUN: 47 mg/dL — ABNORMAL HIGH (ref 8–23)
CO2: 35 mmol/L — ABNORMAL HIGH (ref 22–32)
Calcium: 11.6 mg/dL — ABNORMAL HIGH (ref 8.9–10.3)
Chloride: 100 mmol/L (ref 98–111)
Creatinine, Ser: 1.4 mg/dL — ABNORMAL HIGH (ref 0.61–1.24)
GFR, Estimated: 54 mL/min — ABNORMAL LOW (ref >60)
Glucose, Bld: 204 mg/dL — ABNORMAL HIGH (ref 70–99)
Potassium: 3.8 mmol/L (ref 3.5–5.1)
Sodium: 142 mmol/L (ref 135–145)

## 2020-10-15 LAB — FERRITIN: Ferritin: 1020 ng/mL — ABNORMAL HIGH (ref 24–336)

## 2020-10-15 MED ORDER — VALPROIC ACID 250 MG PO CAPS
500.0000 mg | ORAL_CAPSULE | Freq: Three times a day (TID) | ORAL | Status: DC
  Filled 2020-10-15 (×3): qty 2

## 2020-10-15 MED ORDER — PAROXETINE HCL 10 MG PO TABS
10.0000 mg | ORAL_TABLET | Freq: Every day | ORAL | Status: DC
  Filled 2020-10-15 (×2): qty 1

## 2020-10-15 MED ORDER — DONEPEZIL HCL 10 MG PO TABS
10.0000 mg | ORAL_TABLET | Freq: Every day | ORAL | Status: DC
  Filled 2020-10-15 (×2): qty 1

## 2020-10-15 MED ORDER — ACETAMINOPHEN 160 MG/5ML PO SOLN: 650.0000 mg | Freq: Four times a day (QID) | ORAL | Status: DC | PRN

## 2020-10-15 MED ORDER — LEVETIRACETAM IN NACL 1000 MG/100ML IV SOLN
1000.0000 mg | Freq: Once | INTRAVENOUS | Status: AC
  Administered 2020-10-15: 1000 mg via INTRAVENOUS
  Filled 2020-10-15: qty 100

## 2020-10-15 MED ORDER — FENTANYL CITRATE (PF) 100 MCG/2ML IJ SOLN
25.0000 ug | INTRAMUSCULAR | Status: DC | PRN
Start: 2020-10-15 — End: 2020-10-18

## 2020-10-15 MED ORDER — MEMANTINE HCL 10 MG PO TABS
10.0000 mg | ORAL_TABLET | Freq: Two times a day (BID) | ORAL | Status: DC
  Filled 2020-10-15 (×4): qty 1

## 2020-10-15 MED ORDER — DEXTROSE 10 % IV SOLN: INTRAVENOUS | Status: DC

## 2020-10-15 MED ORDER — INSULIN DETEMIR 100 UNIT/ML ~~LOC~~ SOLN
25.0000 [IU] | Freq: Two times a day (BID) | SUBCUTANEOUS | Status: DC
  Filled 2020-10-15 (×2): qty 0.25

## 2020-10-15 MED ORDER — AMLODIPINE BESYLATE 5 MG PO TABS: 5.0000 mg | ORAL_TABLET | Freq: Every day | ORAL | Status: DC

## 2020-10-15 MED ORDER — ONDANSETRON HCL 4 MG PO TABS: 4.0000 mg | ORAL_TABLET | Freq: Four times a day (QID) | ORAL | Status: DC | PRN

## 2020-10-15 MED ORDER — ADULT MULTIVITAMIN W/MINERALS CH: 1.0000 | ORAL_TABLET | Freq: Every day | ORAL | Status: DC

## 2020-10-15 MED ORDER — THIAMINE HCL 100 MG PO TABS: 100.0000 mg | ORAL_TABLET | Freq: Every day | ORAL | Status: DC

## 2020-10-15 MED ORDER — PANTOPRAZOLE SODIUM 40 MG PO TBEC: 40.0000 mg | DELAYED_RELEASE_TABLET | Freq: Every day | ORAL | Status: DC

## 2020-10-15 MED ORDER — ACETAMINOPHEN 650 MG RE SUPP
650.0000 mg | Freq: Four times a day (QID) | RECTAL | Status: DC | PRN
  Administered 2020-10-20: 650 mg via RECTAL
  Filled 2020-10-15 (×2): qty 1

## 2020-10-15 MED ORDER — ONDANSETRON HCL 4 MG/2ML IJ SOLN: 4.0000 mg | Freq: Four times a day (QID) | INTRAMUSCULAR | Status: DC | PRN

## 2020-10-15 NOTE — Procedures (Signed)
Extubation Procedure Note  Patient Details:   Name: Tony [name] DOB: 04/26/1951 MRN: [id number]   Airway Documentation:    Vent end date: 10/15/20 Vent end time: 1247   Evaluation  O2 sats: stable throughout Complications: No apparent complications Patient did tolerate procedure well. Bilateral Breath Sounds: Clear,Diminished   Yes, pt could speak post extubation.  Pt extubated to 2l/m [place] with family at bedside.  Earney Navy 10/15/2020, 12:49 PM

## 2020-10-15 NOTE — Progress Notes (Signed)
SLP Cancellation Note  Patient Details Name: Tony [name] MRN: [id number] DOB: 1950/11/19   Cancelled treatment:        Extubated 12:49 after extended period. Plan to follow up tomorrow.    Houston Siren 10/15/2020, 2:24 PM  Orbie Pyo Colvin Caroli.Ed Risk analyst [phone] Office [phone] '

## 2020-10-15 NOTE — Progress Notes (Signed)
eLink Physician-Brief Progress Note Patient Name: Tony [name] DOB: [birth year] MRN: [id number]   Date of Service  10/15/2020  HPI/Events of Note  Hypoglycemia X 2 episodes - Blood glucose = 37 and 60.  eICU Interventions  Plan: 1. Decrease Levemir dose from 50 to 25 units BID. 2. D10W IV infusion to run at 50 mL/hour.      Intervention Category Major Interventions: Other:  Lysle Dingwall 10/15/2020, 11:33 PM

## 2020-10-15 NOTE — Progress Notes (Signed)
NAME:  Tony [name], MRN:  [id number], DOB:  [birth year], LOS: 104 ADMISSION DATE:  10/04/2020, CONSULTATION DATE: 3/8 REFERRING MD: Dr. Rosine Door, CHIEF COMPLAINT: Altered mental status  Brief History:  70 yo male admitted from [facility] on 3/07 with AMS, fever (Tm 101.7), tachycardia, lactic acidosis and hyperglycemia.  Required intubation for airway protection and transferred to ICU on 3/08.  Past Medical History:  Alzheimer's dementia, BPH, CKD 3a, DM type 2, HTN, Seizure disorder, CVA  Significant Hospital Events:  3/7 admitted for sepsis/DKA 3/8 unresponsive, intubated, transferred to ICU 3/10 Right great toe amputation 3/11 Tolerate SBT. Mental status precludes extubation. Cumulative fluid balance + 3/12 Weaned off sedation. Poor mental status. Tolerating SBT 3/13 Fever 3/18 mucus plug episode  Consults:  Neurology  Procedures:  ETT 3/8 >  Significant Diagnostic Tests:   CT head 3/7 > no evidence of acute intracranial abnormality  X-ray right foot 3/7 > no gross findings suspicious of osteomyelitis  MRI right foot 3/8 > osteomyelitis of 1st distal phalanx and 1st proximal phalanx, plantar muscle edema concerning for myositis  CT head 3/8 > remote b/l cerebellar infarcts  CT abdomen pelvis 3/8 > bibasilar consolidation, heavy stool burden throughout colon, diverticulosis of descending and sigmoid colon  LP 3/8 > glucose 139, protein 113, RBC 48, WBC 6  EEG 3/8 > generalized slowing  EEG 3/10 > No evidence of epileptogenicity  MRI R foot 3/15 >> no apparent osteo-, no abscess  Micro Data:  COVID/Flu 3/7 > negative MRSA PCR 3/7 > negative Blood culture 3/7 > Urine culture 3/7 > multiple species tracheal aspirate 3/8 > CSF 3/8 > negative Right bone/wound culture 3/10 > negative Blood 3/13 >> negative  Antimicrobials:  Cefepime 3/7 > 3/8 Vancomycin 3/7 > 3/8 Zosyn 3/8 > 3/16 Daptomycin 3/8 > 3/16 Vancomycin 3/16 > Ceftriaxone 3/16 >   Interim  History / Subjective:  Tolerating pressure support.  Objective   Blood pressure (!) 93/56, pulse (!) 105, temperature 100 F (37.8 C), temperature source Axillary, resp. rate 15, height 6' (1.829 m), weight 81 kg, SpO2 100 %.    Vent Mode: PSV;CPAP FiO2 (%):  [30 %-60 %] 30 % Set Rate:  [12 bmp] 12 bmp Vt Set:  [550 mL] 550 mL PEEP:  [5 cmH20-8 cmH20] 8 cmH20 Pressure Support:  [5 cmH20-8 cmH20] 8 cmH20   Intake/Output Summary (Last 24 hours) at 10/15/2020 0931 Last data filed at 10/15/2020 0600 Gross per 24 hour  Intake 2439.81 ml  Output 1870 ml  Net 569.81 ml   Filed Weights   10/12/20 0455 10/13/20 0433 10/15/20 0413  Weight: 82.4 kg 83 kg 81 kg   Physical Exam:  General - more alert Eyes - pupils reactive ENT - ETT in place Cardiac - regular rate/rhythm, no murmur Chest - equal breath sounds b/l, no wheezing or rales Abdomen - soft, non tender, + bowel sounds Extremities - no cyanosis, clubbing, or edema Skin - Rt foot bandage clean Neuro - follows commands appropriately   Resolved Hospital Problem list   Hypernatremia, DKA, AKI from ATN secondary to sepsis, Lactic acidosis  Assessment & Plan:   Acute hypoxic respiratory failure with compromised airway in setting of aspiration pneumonitis. - plan for extubation on 3/19 after family arrives - continue medical care, but he is not to be reintubated - f/u CXR intermittently  Sepsis from aspiration pneumonia and chronic osteomyelitis of Rt foot s/p right great toe amputation 3/10 - day 13 of ABx,  currently on rocephin/vancomycin per ID; plan for 4 weeks total of ABx - last seen by ID on 6/33  Acute metabolic encephalopathy from sepsis, hyperglycemia. Hx of seizures, Alzheimer dementia, CVA. - RASS goal 0 to -1 - Continue Depakote, Aricept, Namenda, Paxil  DM type 2 poorly controlled with hyperglycemia. - SSI with 8 units TF coverage and 50 units levemir bid  CKD 3a. Hx of BPH with urine retention. - f/u  BMET - foley placed 3/18 - hold outpt flomax (can't give per tube)  Anemia of critical illness and chronic disease. - f/u CBC - transfuse for Hb < 7 or significant bleeding  Hx of HTN. - continue norvasc 5 mg daily  Goals of care. - after extubation on 3/19 he is not to be reintubated - d/w his brother [name] >> he would like to see how things progress after extubation before making final decision about CPR and defibrillation in the event of cardiac arrest - [name] was clear that [place] should continue to receive other medical care with the hope that he can still recover  Best practice (evaluated daily)  Diet: tube feeds DVT prophylaxis: Lovenox GI prophylaxis: Protonix Mobility: Bedrest Disposition:ICU Code Status: FULL  Labs    CMP Latest Ref Rng & Units 10/15/2020 10/14/2020 10/13/2020  Glucose 70 - 99 mg/dL 204(H) 191(H) 202(H)  BUN 8 - 23 mg/dL 47(H) 41(H) 39(H)  Creatinine 0.61 - 1.24 mg/dL 1.40(H) 1.25(H) 1.21  Sodium 135 - 145 mmol/L 142 145 145  Potassium 3.5 - 5.1 mmol/L 3.8 3.8 3.9  Chloride 98 - 111 mmol/L 100 101 100  CO2 22 - 32 mmol/L 35(H) 37(H) 37(H)  Calcium 8.9 - 10.3 mg/dL 11.6(H) 12.0(H) 11.8(H)  Total Protein 6.5 - 8.1 g/dL - - -  Total Bilirubin 0.3 - 1.2 mg/dL - - -  Alkaline Phos 38 - 126 U/L - - -  AST 15 - 41 U/L - - -  ALT 0 - 44 U/L - - -    CBC Latest Ref Rng & Units 10/15/2020 10/14/2020 10/13/2020  WBC 4.0 - 10.5 K/uL 23.2(H) 24.4(H) 22.8(H)  Hemoglobin 13.0 - 17.0 g/dL 7.5(L) 8.0(L) 8.3(L)  Hematocrit 39.0 - 52.0 % 24.2(L) 26.8(L) 26.7(L)  Platelets 150 - 400 K/uL 709(H) 675(H) 627(H)    ABG    Component Value Date/Time   PHART 7.529 (H) 10/04/2020 0923   PCO2ART 32.1 10/04/2020 0923   PO2ART 174 (H) 10/04/2020 0923   HCO3 26.4 10/04/2020 0923   TCO2 27 10/04/2020 0923   ACIDBASEDEF 3.7 (H) 02/07/2010 1730   O2SAT 100.0 10/04/2020 0923    CBG (last 3)  Recent Labs    10/14/20 2307 10/15/20 0347 10/15/20 0808  GLUCAP 172* 186* 190*     Critical care time: 33 minutes  Chesley Mires, MD [place] Pager - [phone] 10/15/2020, 9:31 AM

## 2020-10-16 DIAGNOSIS — M869 Osteomyelitis, unspecified: Secondary | ICD-10-CM | POA: Diagnosis not present

## 2020-10-16 DIAGNOSIS — J9621 Acute and chronic respiratory failure with hypoxia: Secondary | ICD-10-CM | POA: Diagnosis not present

## 2020-10-16 LAB — GLUCOSE, CAPILLARY
Glucose-Capillary: 111 mg/dL — ABNORMAL HIGH (ref 70–99)
Glucose-Capillary: 110 mg/dL — ABNORMAL HIGH (ref 70–99)
Glucose-Capillary: 146 mg/dL — ABNORMAL HIGH (ref 70–99)
Glucose-Capillary: 206 mg/dL — ABNORMAL HIGH (ref 70–99)
Glucose-Capillary: 164 mg/dL — ABNORMAL HIGH (ref 70–99)

## 2020-10-16 LAB — BASIC METABOLIC PANEL
Anion gap: 7 (ref 5–15)
BUN: 40 mg/dL — ABNORMAL HIGH (ref 8–23)
CO2: 34 mmol/L — ABNORMAL HIGH (ref 22–32)
Calcium: 10.8 mg/dL — ABNORMAL HIGH (ref 8.9–10.3)
Chloride: 101 mmol/L (ref 98–111)
Creatinine, Ser: 1.28 mg/dL — ABNORMAL HIGH (ref 0.61–1.24)
GFR, Estimated: >60 mL/min (ref >60)
Glucose, Bld: 111 mg/dL — ABNORMAL HIGH (ref 70–99)
Potassium: 3.7 mmol/L (ref 3.5–5.1)
Sodium: 142 mmol/L (ref 135–145)

## 2020-10-16 LAB — CBC
HCT: 26.1 % — ABNORMAL LOW (ref 39.0–52.0)
Hemoglobin: 7.8 g/dL — ABNORMAL LOW (ref 13.0–17.0)
MCH: 31.5 pg (ref 26.0–34.0)
MCHC: 29.9 g/dL — ABNORMAL LOW (ref 30.0–36.0)
MCV: 105.2 fL — ABNORMAL HIGH (ref 80.0–100.0)
Platelets: 742 10*3/uL — ABNORMAL HIGH (ref 150–400)
RBC: 2.48 MIL/uL — ABNORMAL LOW (ref 4.22–5.81)
RDW: 14.5 % (ref 11.5–15.5)
WBC: 26.5 10*3/uL — ABNORMAL HIGH (ref 4.0–10.5)
nRBC: 0.2 % (ref 0.0–0.2)

## 2020-10-16 LAB — VANCOMYCIN, TROUGH: Vancomycin Tr: 25 ug/mL (ref 15–20)

## 2020-10-16 MED ORDER — PROSOURCE TF PO LIQD: 90.0000 mL | Freq: Two times a day (BID) | ORAL | Status: DC

## 2020-10-16 MED ORDER — VALPROATE SODIUM 100 MG/ML IV SOLN
500.0000 mg | Freq: Three times a day (TID) | INTRAVENOUS | Status: DC
  Administered 2020-10-16 – 2020-10-20 (×12): 500 mg via INTRAVENOUS
  Filled 2020-10-16 (×16): qty 5

## 2020-10-16 MED ORDER — VANCOMYCIN HCL 1000 MG/200ML IV SOLN
1000.0000 mg | INTRAVENOUS | Status: DC
  Administered 2020-10-17: 100 mg via INTRAVENOUS

## 2020-10-16 MED ORDER — OSMOLITE 1.2 CAL PO LIQD
1000.0000 mL | ORAL | Status: DC
  Filled 2020-10-16: qty 1000

## 2020-10-16 MED ORDER — OSMOLITE 1.5 CAL PO LIQD: 1000.0000 mL | ORAL | Status: DC

## 2020-10-16 NOTE — Progress Notes (Signed)
NAME:  Tony [name], MRN:  [id number], DOB:  [birth year], LOS: 60 ADMISSION DATE:  10/22/2020, CONSULTATION DATE: 3/8 REFERRING MD: Dr. Rosine Door, CHIEF COMPLAINT: Altered mental status  Brief History:  70 yo male admitted from [facility] on 3/07 with AMS, fever (Tm 101.7), tachycardia, lactic acidosis and hyperglycemia.  Required intubation for airway protection and transferred to ICU on 3/08.  Past Medical History:  Alzheimer's dementia, BPH, CKD 3a, DM type 2, HTN, Seizure disorder, CVA  Significant Hospital Events:  3/7 admitted for sepsis/DKA 3/8 unresponsive, intubated, transferred to ICU 3/10 Right great toe amputation 3/11 Tolerate SBT. Mental status precludes extubation. Cumulative fluid balance + 3/12 Weaned off sedation. Poor mental status. Tolerating SBT 3/13 Fever 3/18 mucus plug episode 3/19 extubated; DNR  Consults:  Neurology  Procedures:  ETT 3/8 > 3/19  Significant Diagnostic Tests:   CT head 3/7 > no evidence of acute intracranial abnormality  X-ray right foot 3/7 > no gross findings suspicious of osteomyelitis  MRI right foot 3/8 > osteomyelitis of 1st distal phalanx and 1st proximal phalanx, plantar muscle edema concerning for myositis  CT head 3/8 > remote b/l cerebellar infarcts  CT abdomen pelvis 3/8 > bibasilar consolidation, heavy stool burden throughout colon, diverticulosis of descending and sigmoid colon  LP 3/8 > glucose 139, protein 113, RBC 48, WBC 6  EEG 3/8 > generalized slowing  EEG 3/10 > No evidence of epileptogenicity  MRI R foot 3/15 >> no apparent osteo-, no abscess  Micro Data:  COVID/Flu 3/7 > negative MRSA PCR 3/7 > negative Blood culture 3/7 > Urine culture 3/7 > multiple species tracheal aspirate 3/8 > CSF 3/8 > negative Right bone/wound culture 3/10 > negative Blood 3/13 >> negative  Antimicrobials:  Cefepime 3/7 > 3/8 Vancomycin 3/7 > 3/8 Zosyn 3/8 > 3/16 Daptomycin 3/8 > 3/16 Vancomycin 3/16  > Ceftriaxone 3/16 >   Interim History / Subjective:  Didn't do well with swallow assessment.  Gurgling with poor cough effort.    Objective   Blood pressure (!) 90/57, pulse 100, temperature 98.8 F (37.1 C), temperature source Axillary, resp. rate 20, height 6' (1.829 m), weight 81.4 kg, SpO2 91 %.    Vent Mode: PSV;CPAP FiO2 (%):  [28 %-30 %] 28 % Pressure Support:  [8 cmH20] 8 cmH20   Intake/Output Summary (Last 24 hours) at 10/16/2020 1013 Last data filed at 10/16/2020 0600 Gross per 24 hour  Intake 810.75 ml  Output 985 ml  Net -174.25 ml   Filed Weights   10/13/20 0433 10/15/20 0413 10/16/20 0500  Weight: 83 kg 81 kg 81.4 kg   Physical Exam:  General - alert Eyes - pupils reactive ENT - gurgling Cardiac - regular rate/rhythm, no murmur Chest - b/l rhonchi Abdomen - soft, non tender, + bowel sounds Extremities - 1+ edema Skin - Rt foot dressing clean Neuro - Lt facial drop, follows simple commands   Resolved Hospital Problem list   Hypernatremia, DKA, AKI from ATN secondary to sepsis, Lactic acidosis  Assessment & Plan:   Acute hypoxic respiratory failure with compromised airway in setting of aspiration pneumonitis. - extubated 3/19, and now DNR/DNI  Sepsis from aspiration pneumonia and chronic osteomyelitis of Rt foot s/p right great toe amputation 3/10 - day 14 of ABx, currently on rocephin/vancomycin per ID; plan for 4 weeks total of ABx - last seen by ID on 4/43  Acute metabolic encephalopathy from sepsis, hyperglycemia. Hx of seizures, Alzheimer dementia, CVA. - change depakote  to IV for now - resume aricept, nameda, paxil after he has cortrak placed  DM type 2 poorly controlled with hyperglycemia. - episode of hypoglycemia on 3/19 after extubation and tube feeds stopped - continue dextrose in IV fluid - levemir reduced to 25 units bid on 3/20 - SSI  CKD 3a. Hx of BPH with urine retention. - f/u BMET intermittently - foley placed 3/18 - hold  outpt flomax (can't give per tube)  Anemia of critical illness and chronic disease. Thrombocytosis. - f/u CBC intermittently - transfuse for Hb < 7 or significant bleeding  Hx of HTN. - hold norvasc for now  Goals of care. - DNR/DNI - family would like to continue medical care if he can improve - concern is that he will not be able to control his oral/respiratory secretions and will developed respiratory distress from aspiration again; if this occurs, then family would likely have Carloyn Manner transition to comfort measures  Best practice (evaluated daily)  Diet: NPO DVT prophylaxis: Lovenox GI prophylaxis: Not indicated Mobility: Bedrest Disposition:ICU Code Status: DNR/DNI  Labs    CMP Latest Ref Rng & Units 10/16/2020 10/15/2020 10/14/2020  Glucose 70 - 99 mg/dL 111(H) 204(H) 191(H)  BUN 8 - 23 mg/dL 40(H) 47(H) 41(H)  Creatinine 0.61 - 1.24 mg/dL 1.28(H) 1.40(H) 1.25(H)  Sodium 135 - 145 mmol/L 142 142 145  Potassium 3.5 - 5.1 mmol/L 3.7 3.8 3.8  Chloride 98 - 111 mmol/L 101 100 101  CO2 22 - 32 mmol/L 34(H) 35(H) 37(H)  Calcium 8.9 - 10.3 mg/dL 10.8(H) 11.6(H) 12.0(H)  Total Protein 6.5 - 8.1 g/dL - - -  Total Bilirubin 0.3 - 1.2 mg/dL - - -  Alkaline Phos 38 - 126 U/L - - -  AST 15 - 41 U/L - - -  ALT 0 - 44 U/L - - -    CBC Latest Ref Rng & Units 10/16/2020 10/15/2020 10/14/2020  WBC 4.0 - 10.5 K/uL 26.5(H) 23.2(H) 24.4(H)  Hemoglobin 13.0 - 17.0 g/dL 7.8(L) 7.5(L) 8.0(L)  Hematocrit 39.0 - 52.0 % 26.1(L) 24.2(L) 26.8(L)  Platelets 150 - 400 K/uL 742(H) 709(H) 675(H)    ABG    Component Value Date/Time   PHART 7.529 (H) 10/04/2020 0923   PCO2ART 32.1 10/04/2020 0923   PO2ART 174 (H) 10/04/2020 0923   HCO3 26.4 10/04/2020 0923   TCO2 27 10/04/2020 0923   ACIDBASEDEF 3.7 (H) 02/07/2010 1730   O2SAT 100.0 10/04/2020 0923    CBG (last 3)  Recent Labs    10/15/20 2355 10/16/20 0250 10/16/20 0712  GLUCAP 105* 111* 110*    Signature:  Chesley Mires, MD [place] Pager - [phone] 10/16/2020, 10:13 AM

## 2020-10-16 NOTE — Progress Notes (Addendum)
Pharmacy Antibiotic Note  Tony [name] is a 70 y.o. male admitted on 10/27/2020 with osteomyelitis.  Pharmacy has been consulted for vancomycin dosing.  Patient initially on daptomycin and zosyn for osteomyelitis of the right great toe. He is s/p amputation 3/10. Cultures remained negative throughout admission. Patient likely to discharge to SNF for further care. Transitioned to ceftriaxone plus vancomycin for long term IV antibiotic course.   3/20 VT (drawn 2hr early) = 25 is supratherapeutic. Renal function mostly stable, ClCr ~58 ml. UOP 0.7 ml/kg/min. Vancomycin dose held. Planning for 4 weeks abx.   Plan: Decrease vancomycin to 1g IV q24h (est trough 16) Continue ceftriaxone 2g IV q24h  Monitor cultures, clinical status, renal fx, vanc levels, and f/u duration (4 wks = 4/6)   Height: 6' (182.9 cm) Weight: 81.4 kg (179 lb 7.3 oz) IBW/kg (Calculated) : 77.6  Temp (24hrs), Avg:98.8 F (37.1 C), Min:98.1 F (36.7 C), Max:99.6 F (37.6 C)  Recent Labs  Lab 10/12/20 0259 10/13/20 0403 10/14/20 0337 10/15/20 0418 10/16/20 0314 10/16/20 1956  WBC 19.0* 22.8* 24.4* 23.2* 26.5*  --   CREATININE 1.33* 1.21 1.25* 1.40* 1.28*  --   VANCOTROUGH  --   --   --   --   --  25*    Estimated Creatinine Clearance: 58.9 mL/min (A) (by C-G formula based on SCr of 1.28 mg/dL (H)).    No Known Allergies  Antimicrobials this admission: Cefepime 3/7  x1  Metronidazole 3/7 x1 Vanc 3/7 x1, 3/16 >>  Daptomycin 3/8 >> 3/16 Zosyn 3/8 >> 3/16 CTX 3/16>>  3/20 VT (drawn 2hr early) = 25  Microbiology 3/13 BCx ngtd 3/10 R toe tissue ngtd 3/10 R toe bone ngtd  3/10 fungal cxs ngtd 3/10 UCx ngtd  3/8 CSF ngtd 3/8 TA - few normal resp flora  3/8 MRSA neg  3/7 Bcx ngtd  3/7 Ucx ngtd     Thank you for allowing pharmacy to be a part of this patient's care.  Benetta Spar, PharmD, BCPS, BCCP Clinical Pharmacist  Please check AMION for all [place] phone numbers After 10:00 PM, call [place] [phone]

## 2020-10-16 NOTE — Evaluation (Signed)
Physical Therapy Evaluation and Discharge Patient Details Name: Tony [name] MRN: [id number] DOB: [birth year] Today's Date: 10/16/2020   History of Present Illness  70 y/o male admitted with worsening altered mental status requiring emergent intubation with sepsis and osteomyelitis of the right great toe proximal and distal phalanx. There is concern for seizures with history of seizure disorder. Intubation 3/8-3/19;  3/10 right great toe amputation  Clinical Impression   Patient evaluated by Physical Therapy with no further acute PT needs identified. Assessed patient and noted he could not follow commands and with flexion contractures of bil knees and rt elbow. Spoke with Lelon Frohlich at [facility] and pt is a long-term care resident and total care at their facility. Patient is not an appropriate candidate for further therapy and PT is signing off. Thank you for this referral.     Follow Up Recommendations No PT follow up;Other (comment) (return to long-term care)    Equipment Recommendations  None recommended by PT    Recommendations for Other Services       Precautions / Restrictions Precautions Precautions: Fall Restrictions Weight Bearing Restrictions: No      Mobility  Bed Mobility Overal bed mobility: Needs Assistance             General bed mobility comments: total assist    Transfers                 General transfer comment: total lift at [facility]  Ambulation/Gait             General Gait Details: non-ambulatory at [facility]  Stairs            Wheelchair Mobility    Modified Rankin (Stroke Patients Only)       Balance                                             Pertinent Vitals/Pain Pain Assessment: Faces Faces Pain Scale: No hurt    Home Living Family/patient expects to be discharged to:: Other (Comment) (long-term care unit)                 Additional Comments: per chart, from [facility]     Prior Function Level of Independence: Needs assistance   Gait / Transfers Assistance Needed: Per Ann @ [facility] pt was total care and used a lift if got OOB. He was a long-term care resident  ADL's / Homemaking Assistance Needed: as above. He no longer was even able to feed himself due to dementia        Hand Dominance        Extremity/Trunk Assessment   Upper Extremity Assessment Upper Extremity Assessment: Generalized weakness (rt elbow flexion contracture; bil hand edema)    Lower Extremity Assessment Lower Extremity Assessment: RLE deficits/detail;LLE deficits/detail (bil knee contractures; left leg in external rotation but able to achieve neutral rotation)    Cervical / Trunk Assessment Cervical / Trunk Assessment: Other exceptions Cervical / Trunk Exceptions: very rigid  Communication   Communication: Expressive difficulties  Cognition Arousal/Alertness: Awake/alert Behavior During Therapy: Flat affect Overall Cognitive Status: History of cognitive impairments - at baseline                                 General Comments: did not follow  commands; could not state name or DOB      General Comments      Exercises Other Exercises Other Exercises: Performed AAROM x 4 extremities to assess if pt could follow any commands (he did not)   Assessment/Plan    PT Assessment Patent does not need any further PT services  PT Problem List         PT Treatment Interventions      PT Goals (Current goals can be found in the Care Plan section)  Acute Rehab PT Goals Patient Stated Goal: unable/NA PT Goal Formulation: Patient unable to participate in goal setting    Frequency     Barriers to discharge        Co-evaluation               AM-PAC PT "6 Clicks" Mobility  Outcome Measure Help needed turning from your back to your side while in a flat bed without using bedrails?: Total Help needed moving from lying on your back to sitting on the  side of a flat bed without using bedrails?: Total Help needed moving to and from a bed to a chair (including a wheelchair)?: Total Help needed standing up from a chair using your arms (e.g., wheelchair or bedside chair)?: Total Help needed to walk in hospital room?: Total Help needed climbing 3-5 steps with a railing? : Total 6 Click Score: 6    End of Session   Activity Tolerance: Patient tolerated treatment well Patient left: in bed;with call bell/phone within reach;with bed alarm set Nurse Communication: Other (comment);Need for lift equipment (no PT needs) PT Visit Diagnosis: Other symptoms and signs involving the nervous system (Q33.354)    Time: 5625-6389 PT Time Calculation (min) (ACUTE ONLY): 13 min   Charges:   PT Evaluation $PT Eval Low Complexity: 1 Low           Arby Barrette, PT Pager [phone]   Rexanne Mano 10/16/2020, 3:00 PM

## 2020-10-16 NOTE — Evaluation (Signed)
Clinical/Bedside Swallow Evaluation Patient Details  Name: Tony [name] MRN: [id number] Date of Birth: [birth year]  Today's Date: 10/16/2020 Time: SLP Start Time (ACUTE ONLY): 7782 SLP Stop Time (ACUTE ONLY): 0954 SLP Time Calculation (min) (ACUTE ONLY): 26 min  Past Medical History:  Past Medical History:  Diagnosis Date  . Alzheimer's disease ([place])   . BPH (benign prostatic hyperplasia)   . CKD (chronic kidney disease)   . Diabetes mellitus    Type II, diagnosed 2011, not on insulin; admitted in 2011 for hyperglycemia  . HTN (hypertension)   . Seizure disorder ([place])    diagnosed in childhood, last seizure was years ago   Past Surgical History:  Past Surgical History:  Procedure Laterality Date  . AMPUTATION TOE Right 10/02/2020   Procedure: AMPUTATION TOE, right great toe;  Surgeon: Evelina Bucy, DPM;  Location: [facility];  Service: Podiatry;  Laterality: Right;  Ok to leave in bed  . NO PAST SURGERIES    . TRANSURETHRAL RESECTION OF PROSTATE N/A 07/10/2019   Procedure: TRANSURETHRAL RESECTION OF THE PROSTATE (TURP);  Surgeon: Lucas Mallow, MD;  Location: [facility];  Service: Urology;  Laterality: N/A;   HPI:  70 yo male with hx of alzheimer's dementia, seizures, CVA admitted from [facility] on 3/07 with AMS, fever, tachycardia, lactic acidosis and hyperglycemia.  Required intubation for airway protection and transferred to ICU on 3/08. ETT 3/8-3/19.  3/10 right great toe amputation; 3/18 mucus plug episode; dx include sepsis from asp pna and chronic osteolyelitis of right foot. Pt's swallowing was evaluated during a November 2020 hospital admission, at which time he was dx'd with a primary cogntive-based dysphagia marked by oral holding/pocketing and decreased initiation, common clinical symptoms associated with dementia.   Assessment / Plan / Recommendation Clinical Impression  Pt presents with an acute-on-chronic dysphagia with primary post-extubation effects after 11 day  ETT. He followed commands intermittently; was resistant to oral suctioning (RN reports the same).  Eagerly accepted trials of ice chips and 1/2 teaspoons of water, all of which led to multiple sub-swallows, immediate coughing, and loosening of audible secretions that were not reachable via oral suctioning. Pt did swallow secretions after coughing, but there was no discernable improvement in vocal quality.  Recommend continued NPO for now with frequent oral care due to high aspiration risk.  Spoke with RN and Dr. Halford Chessman - will plan for cortrak tomorrow.  SLP will f/u next date for improvements/PO readiness. SLP Visit Diagnosis: Dysphagia, oropharyngeal phase (R13.12)    Aspiration Risk  Severe aspiration risk    Diet Recommendation   NPO with cortrak       Other  Recommendations Oral Care Recommendations: Oral care QID   Follow up Recommendations Skilled Nursing facility      Frequency and Duration min 2x/week  2 weeks       Prognosis Prognosis for Safe Diet Advancement: Fair Barriers to Reach Goals: Cognitive deficits      Swallow Study   General Date of Onset: 10/07/2020 HPI: 70 yo male with hx of alzheimer's dementia, seizures, CVA admitted from [facility] on 3/07 with AMS, fever, tachycardia, lactic acidosis and hyperglycemia.  Required intubation for airway protection and transferred to ICU on 3/08. ETT 3/8-3/19.  3/10 right great toe amputation; 3/18 mucus plug episode; dx include sepsis from asp pna and chronic osteolyelitis of right foot. Pt's swallowing was evaluated during a November 2020 hospital admission, at which time he was dx'd with a primary  cogntive-based dysphagia marked by oral holding/pocketing and decreased initiation, common clinical symptoms associated with dementia. Type of Study: Bedside Swallow Evaluation Previous Swallow Assessment: see HPI Diet Prior to this Study: NPO;NG Tube Temperature Spikes Noted: No Respiratory Status: Nasal cannula History of  Recent Intubation: Yes Length of Intubations (days): 11 days Date extubated: 10/15/20 Behavior/Cognition: Alert Oral Cavity Assessment: Within Functional Limits Oral Care Completed by SLP: Recent completion by staff Oral Cavity - Dentition: Poor condition;Missing dentition Self-Feeding Abilities: Total assist Patient Positioning: Upright in bed Baseline Vocal Quality: Wet Volitional Cough: Wet;Congested Volitional Swallow: Able to elicit    Oral/Motor/Sensory Function Overall Oral Motor/Sensory Function: Other (comment) (difficulty following commands consistently)   Ice Chips Ice chips: Impaired Presentation: Spoon Pharyngeal Phase Impairments: Suspected delayed Swallow;Multiple swallows;Wet Vocal Quality;Cough - Immediate   Thin Liquid Thin Liquid: Impaired Presentation: Spoon Pharyngeal  Phase Impairments: Multiple swallows;Wet Vocal Quality;Cough - Immediate    Nectar Thick Nectar Thick Liquid: Not tested   Honey Thick Honey Thick Liquid: Not tested   Puree Puree: Not tested   Solid     Solid: Not tested      Juan Quam Laurice 10/16/2020,10:04 AM  Estill Bamberg L. Tivis Ringer, [place] Office number [phone] Pager [phone]

## 2020-10-16 NOTE — Progress Notes (Signed)
Initial Nutrition Assessment  RD working remotely.  DOCUMENTATION CODES:   Not applicable  INTERVENTION:   Once Cortrak placed on 10/17/20, initiate tube feeds: - Start Osmolite 1.5 @ 20 ml/hr and advance by 10 ml q 4 hours to goal rate of 60 ml/hr (1440 ml/day) - ProSource 90 ml BID  Tube feeding regimen at goal rate provides 2320 kcal, 134 grams of protein, and 1097 ml of H2O.   NUTRITION DIAGNOSIS:   Inadequate oral intake related to dysphagia as evidenced by NPO status.  GOAL:   Patient will meet greater than or equal to 90% of their needs  MONITOR:   Diet advancement,TF tolerance,Labs,Weight trends,Skin  REASON FOR ASSESSMENT:   Consult Enteral/tube feeding initiation and management  ASSESSMENT:   70 yo admitted with AMS and sepsis due to right foot osteomyelitis, AKI, acute metabolic encephalopathy. PMH Alzheimer's disease, CKD III, DM, HTN, seizure disorder. Pt resides in [facility]   3/07 - admitted for sepsis, DKA 3/08 - unresponsive, intubated, transferred to ICU 3/10 - R great toe amputation 3/19 - extubated  Pt is now DNR/DNI after extubation. Pt with episodes of hypoglycemia after extubation and tube feeds stopped. D10 started.  SLP evaluated pt today and recommend NPO with alternative means of nutrition. Plan is for Cortrak placement tomorrow. Order for Tony [name] is in place.  Admit weight: 84.5 kg Current weight: 81.4 kg  Per RN edema assessment this AM, pt with generalized edema.  Medications reviewed and include: SSI q 4 hours, MVI with minerals, thiamine, IV abx, depacon IVF: D10 @ 50 ml/hr  Labs reviewed: BUN 40, creatinine 1.28, hemoglobin 7.8 CG's: 37-146 x 24 hours  UOP: 985 ml x 24 hours I/O's: +11.6 L since admit  Diet Order:   Diet Order            Diet NPO time specified  Diet effective now                 EDUCATION NEEDS:   Education needs have been addressed  Skin:  Skin Assessment: Skin Integrity Issues: Stage II:  sacrum Other: R great toe amputation on 3/10 for necrotic wound  Last BM:  10/13/20 smear type 7  Height:   Ht Readings from Last 1 Encounters:  10/04/20 6' (1.829 m)    Weight:   Wt Readings from Last 1 Encounters:  10/16/20 81.4 kg    BMI:  Body mass index is 24.34 kg/m.  Estimated Nutritional Needs:   Kcal:  1540-0867  Protein:  125-150 g  Fluid:  >/= 2 L    Tony Bryant, MS, RD, LDN Inpatient Clinical Dietitian Please see [facility] for contact information.

## 2020-10-17 DIAGNOSIS — J9621 Acute and chronic respiratory failure with hypoxia: Secondary | ICD-10-CM | POA: Diagnosis not present

## 2020-10-17 LAB — CBC
HCT: 23.9 % — ABNORMAL LOW (ref 39.0–52.0)
Hemoglobin: 7 g/dL — ABNORMAL LOW (ref 13.0–17.0)
MCH: 31.4 pg (ref 26.0–34.0)
MCHC: 29.3 g/dL — ABNORMAL LOW (ref 30.0–36.0)
MCV: 107.2 fL — ABNORMAL HIGH (ref 80.0–100.0)
Platelets: 656 10*3/uL — ABNORMAL HIGH (ref 150–400)
RBC: 2.23 MIL/uL — ABNORMAL LOW (ref 4.22–5.81)
RDW: 14.8 % (ref 11.5–15.5)
WBC: 17.3 10*3/uL — ABNORMAL HIGH (ref 4.0–10.5)
nRBC: 0.4 % — ABNORMAL HIGH (ref 0.0–0.2)

## 2020-10-17 LAB — GLUCOSE, CAPILLARY
Glucose-Capillary: 137 mg/dL — ABNORMAL HIGH (ref 70–99)
Glucose-Capillary: 150 mg/dL — ABNORMAL HIGH (ref 70–99)
Glucose-Capillary: 152 mg/dL — ABNORMAL HIGH (ref 70–99)
Glucose-Capillary: 158 mg/dL — ABNORMAL HIGH (ref 70–99)
Glucose-Capillary: 160 mg/dL — ABNORMAL HIGH (ref 70–99)
Glucose-Capillary: 165 mg/dL — ABNORMAL HIGH (ref 70–99)

## 2020-10-17 LAB — BASIC METABOLIC PANEL
Anion gap: 8 (ref 5–15)
BUN: 35 mg/dL — ABNORMAL HIGH (ref 8–23)
CO2: 30 mmol/L (ref 22–32)
Calcium: 10.6 mg/dL — ABNORMAL HIGH (ref 8.9–10.3)
Chloride: 101 mmol/L (ref 98–111)
Creatinine, Ser: 1.49 mg/dL — ABNORMAL HIGH (ref 0.61–1.24)
GFR, Estimated: 50 mL/min — ABNORMAL LOW (ref >60)
Glucose, Bld: 157 mg/dL — ABNORMAL HIGH (ref 70–99)
Potassium: 3.8 mmol/L (ref 3.5–5.1)
Sodium: 139 mmol/L (ref 135–145)

## 2020-10-17 NOTE — Progress Notes (Signed)
NAME:  Tony [name], MRN:  [id number], DOB:  09/19/1950, LOS: 87 ADMISSION DATE:  10/05/2020, CONSULTATION DATE: 3/8 REFERRING MD: Dr. Rosine Door, CHIEF COMPLAINT: Altered mental status  Brief History:  70 yo male admitted from [facility] on 3/07 with AMS, fever (Tm 101.7), tachycardia, lactic acidosis and hyperglycemia.  Required intubation for airway protection and transferred to ICU on 3/08.  Past Medical History:  Alzheimer's dementia, BPH, CKD 3a, DM type 2, HTN, Seizure disorder, CVA  Significant Hospital Events:  3/7 admitted for sepsis/DKA 3/8 unresponsive, intubated, transferred to ICU 3/10 Right great toe amputation 3/11 Tolerate SBT. Mental status precludes extubation. Cumulative fluid balance + 3/12 Weaned off sedation. Poor mental status. Tolerating SBT 3/13 Fever 3/18 mucus plug episode 3/19 extubated; DNR  Consults:  Neurology  Procedures:  ETT 3/8 > 3/19  Significant Diagnostic Tests:   CT head 3/7 > no evidence of acute intracranial abnormality  X-ray right foot 3/7 > no gross findings suspicious of osteomyelitis  MRI right foot 3/8 > osteomyelitis of 1st distal phalanx and 1st proximal phalanx, plantar muscle edema concerning for myositis  CT head 3/8 > remote b/l cerebellar infarcts  CT abdomen pelvis 3/8 > bibasilar consolidation, heavy stool burden throughout colon, diverticulosis of descending and sigmoid colon  LP 3/8 > glucose 139, protein 113, RBC 48, WBC 6  EEG 3/8 > generalized slowing  EEG 3/10 > No evidence of epileptogenicity  MRI R foot 3/15 >> no apparent osteo-, no abscess  Micro Data:  COVID/Flu 3/7 > negative MRSA PCR 3/7 > negative Blood culture 3/7 > Urine culture 3/7 > multiple species tracheal aspirate 3/8 > CSF 3/8 > negative Right bone/wound culture 3/10 > negative Blood 3/13 >> negative  Antimicrobials:  Cefepime 3/7 > 3/8 Vancomycin 3/7 > 3/8 Zosyn 3/8 > 3/16 Daptomycin 3/8 > 3/16 Vancomycin 3/16  > Ceftriaxone 3/16 >   Interim History / Subjective:  Increasing temperatures overnight.  Increasing O2 needs overnight.    Objective   Blood pressure (!) 95/57, pulse (!) 101, temperature (!) 100.4 F (38 C), temperature source Axillary, resp. rate 16, height 6' (1.829 m), weight 77.8 kg, SpO2 98 %.        Intake/Output Summary (Last 24 hours) at 10/17/2020 0800 Last data filed at 10/17/2020 0600 Gross per 24 hour  Intake 1331.15 ml  Output 1460 ml  Net -128.85 ml   Filed Weights   10/15/20 0413 10/16/20 0500 10/17/20 0500  Weight: 81 kg 81.4 kg 77.8 kg   Physical Exam:  General - somnolent Eyes - pupils reactive ENT - gurgling, poor dentition Cardiac - regular rate/rhythm, no murmur Chest - b/l rhonchi Abdomen - soft, non tender, + bowel sounds Extremities - 1+ edema Skin - Rt foot bandage clean Neuro - opens eyes with stimulation   Resolved Hospital Problem list   Hypernatremia, DKA, AKI from ATN secondary to sepsis, Lactic acidosis  Assessment:   Acute hypoxic respiratory failure with compromised airway in setting of aspiration pneumonitis. Sepsis from aspiration pneumonia and chronic osteomyelitis of Rt foot s/p right great toe amputation 2/24 Acute metabolic encephalopathy from sepsis, hyperglycemia. Hx of seizures, Alzheimer dementia, CVA. DM type 2 poorly controlled with hyperglycemia. CKD 3a. Hx of BPH with urine retention. Anemia of critical illness and chronic disease. Thrombocytosis. Hx of HTN.  Plan:   DNR/DNI Defer additional lab testing for now Defer cortrak placement Will d/w family about whether we are at the point to consider transitioning to comfort  measures  Signature:  Chesley Mires, MD [place] Pager - [phone] 10/17/2020, 8:00 AM

## 2020-10-17 NOTE — Progress Notes (Signed)
Pt transferred from 50M ICU to 6N room 29. Pt alert and VSS at this time.

## 2020-10-17 NOTE — Progress Notes (Signed)
Spoke with [name].  Family understands that Tony [name] is deteriorating medical status.  Decision made to transition to comfort measures.  [name] has requested that palliative care be involved to assist with symptom management and hospice care.  Will arrange for transfer to 6N.  Continue valproic acid, IV fluids, and blood sugar checks for now.  D/w other medications not involved with keeping Tony [name] comfortable.  Anticipate he will pass away in the hospital.  Chesley Mires, MD [place] Pager - [phone] 10/17/2020, 11:32 AM

## 2020-10-17 NOTE — Progress Notes (Signed)
Podiatry Progress Note  Subjective: Tony [name] is a 70 y.o. male patient seen at bedside, resting comfortably able to open eyes periodically as I am speaking to him.  Patient has foot history of chronic osteomyelitis and is s/p Right great toe amputation performed by Dr. March Rummage on 10/23/2020. No other issues noted.   Patient Active Problem List   Diagnosis Date Noted  . Acute on chronic respiratory failure with hypoxia ([place])   . Osteomyelitis of great toe ([place]) 10/07/2020  . Unresponsive   . Endotracheal tube present   . Acute osteomyelitis of toe, right ([place])   . Altered mental status 10/04/2020  . DKA (diabetic ketoacidosis) ([place]) 10/15/2020  . Ulcer of great toe, right, with unspecified severity ([place]) 10/18/2020  . Metabolic encephalopathy 25/63/8937  . Urinary retention 07/10/2019  . Acute metabolic encephalopathy 34/28/7681  . Sepsis ([place])   . Pressure injury of skin 06/13/2019  . Complicated UTI (urinary tract infection) 06/12/2019  . Sepsis secondary to UTI ([place]) 06/12/2019  . Acute renal failure superimposed on stage 3b chronic kidney disease ([place]) 06/12/2019  . BPH with obstruction/lower urinary tract symptoms 06/12/2019  . Sepsis with acute renal failure without septic shock ([place])   . Acute urinary retention   . UTI (urinary tract infection) 04/10/2019  . Acute UTI 04/10/2019  . Seizure ([place]) 12/15/2018  . CAP (community acquired pneumonia) 12/14/2018  . Dementia ([place]) 10/20/2018  . Enlarged prostate without lower urinary tract symptoms ([place]) 06/30/2018  . Hyperprolactinemia ([place]) 11/20/2017  . HLD (hyperlipidemia) 09/02/2016  . Chronic kidney disease 03/19/2016  . Gynecomastia, male 04/25/2015  . Routine health maintenance 04/25/2015  . Erectile disorder due to medical condition in male patient 05/07/2014  . Hypertension 12/12/2011  . Prediabetes 12/12/2011  . Seizure disorder ([place]) 12/12/2011     Current Facility-Administered Medications:  .  0.9 %  sodium  chloride infusion, , Intravenous, PRN, Evelina Bucy, DPM, Paused at 09/27/2020 [phone] .  acetaminophen (TYLENOL) 160 MG/5ML solution 650 mg, 650 mg, Oral, Q6H PRN **OR** acetaminophen (TYLENOL) suppository 650 mg, 650 mg, Rectal, Q6H PRN, Sood, Vineet, MD .  cefTRIAXone (ROCEPHIN) 2 g in sodium chloride 0.9 % 100 mL IVPB, 2 g, Intravenous, Q24H, [name], Lavell Islam, MD, Stopped at 10/16/20 1213 .  chlorhexidine gluconate (MEDLINE KIT) (PERIDEX) 0.12 % solution 15 mL, 15 mL, Mouth Rinse, BID, Evelina Bucy, DPM, 15 mL at 10/17/20 0800 .  Chlorhexidine Gluconate Cloth 2 % PADS 6 each, 6 each, Topical, Daily, Evelina Bucy, DPM, 6 each at 10/16/20 1000 .  dextrose 10 % infusion, , Intravenous, Continuous, Anders Simmonds, MD, Last Rate: 50 mL/hr at 10/17/20 0600, Infusion Verify at 10/17/20 0600 .  dextrose 50 % solution 0-50 mL, 0-50 mL, Intravenous, PRN, Evelina Bucy, DPM, 25 mL at 10/15/20 2321 .  enoxaparin (LOVENOX) injection 40 mg, 40 mg, Subcutaneous, Q24H, Evelina Bucy, DPM, 40 mg at 10/16/20 2204 .  fentaNYL (SUBLIMAZE) injection 25 mcg, 25 mcg, Intravenous, Q2H PRN, Sood, Vineet, MD .  insulin aspart (novoLOG) injection 0-20 Units, 0-20 Units, Subcutaneous, Q4H, Margaretha Seeds, MD, 4 Units at 10/17/20 0800 .  mupirocin ointment (BACTROBAN) 2 % 1 application, 1 application, Topical, BID, Evelina Bucy, DPM, 1 application at 62/03/55 2219 .  [DISCONTINUED] ondansetron (ZOFRAN) tablet 4 mg, 4 mg, Oral, Q6H PRN **OR** ondansetron (ZOFRAN) injection 4 mg, 4 mg, Intravenous, Q6H PRN, Sood, Vineet, MD .  sodium chloride flush (NS) 0.9 % injection 10-40  mL, 10-40 mL, Intracatheter, Q12H, Collene Gobble, MD, 10 mL at 10/16/20 2220 .  sodium chloride flush (NS) 0.9 % injection 10-40 mL, 10-40 mL, Intracatheter, PRN, Collene Gobble, MD .  valproate (DEPACON) 500 mg in dextrose 5 % 50 mL IVPB, 500 mg, Intravenous, Q8H, Sood, Vineet, MD, Last Rate: 55 mL/hr at 10/17/20 0609, 500 mg at  10/17/20 0609 .  [COMPLETED] vancomycin (VANCOREADY) IVPB 1750 mg/350 mL, 1,750 mg, Intravenous, Once, Stopped at 10/12/20 2226 **FOLLOWED BY** vancomycin (VANCOREADY) IVPB 1000 mg/200 mL, 1,000 mg, Intravenous, Q24H, Donnamae Jude, [facility], Last Rate: 200 mL/hr at 10/17/20 0931, 100 mg at 10/17/20 0931  No Known Allergies   Objective: Today's Vitals   10/17/20 0530 10/17/20 0600 10/17/20 0630 10/17/20 0700  BP:  (!) 95/57    Pulse: (!) 104 (!) 101 (!) 101   Resp: (!) 23 (!) 21 16   Temp:    98.2 F (36.8 C)  TempSrc:    Oral  SpO2: 99% 98% 98%   Weight:      Height:      PainSc:        General: No acute distress  Right Lower extremity: Dressing to right foot clean, dry, intact. No strikethrough noted, Upon removal of dressings, Staples intact with no dehiscence. Minimal edema, no significant erythema, no warmth no active drainage. No other acute signs of infection.  Left lower extremity: Protective mepelix border dressing noted to the heel.      Assessment and Plan:  Problem List Items Addressed This Visit      Respiratory   Acute on chronic respiratory failure with hypoxia (HCC)   Relevant Orders   DG Chest Port 1 View (Completed)     Other   Sepsis ([place])   Relevant Orders   DG FL GUIDED LUMBAR PUNCTURE (Completed)   Glucose, CSF (Completed)   Protein, CSF (Completed)   CSF cell count with differential (Completed)   CSF culture w Gram Stain (Completed)   Fungus Culture With Stain (Completed)   HSV(herpes smplx vrs)abs-I+II(IgG)-CSF (Completed)   Anaerobic culture w Gram Stain (Completed)   Altered mental status   Ulcer of great toe, right, with unspecified severity ([place])   Relevant Orders   DG Abd 1 View (Completed)   Glucose, CSF (Completed)   Protein, CSF (Completed)   CSF cell count with differential (Completed)   CSF culture w Gram Stain (Completed)   Fungus Culture With Stain (Completed)   HSV(herpes smplx vrs)abs-I+II(IgG)-CSF (Completed)   Anaerobic  culture w Gram Stain (Completed)   Endotracheal tube present   Relevant Orders   Portable Chest x-ray (Completed)   Glucose, CSF (Completed)   Protein, CSF (Completed)   CSF cell count with differential (Completed)   CSF culture w Gram Stain (Completed)   Fungus Culture With Stain (Completed)   HSV(herpes smplx vrs)abs-I+II(IgG)-CSF (Completed)   Anaerobic culture w Gram Stain (Completed)    Other Visit Diagnoses    Septic shock ([place])    -  Primary   Relevant Medications   ceFEPIme (MAXIPIME) 2 g in sodium chloride 0.9 % 100 mL IVPB (Completed)   metroNIDAZOLE (FLAGYL) IVPB 500 mg (Completed)   vancomycin (VANCOREADY) IVPB 1750 mg/350 mL (Completed)   mupirocin ointment (BACTROBAN) 2 % 1 application   cefTRIAXone (ROCEPHIN) 2 g in sodium chloride 0.9 % 100 mL IVPB   vancomycin (VANCOREADY) IVPB 1750 mg/350 mL (Completed)   vancomycin (VANCOREADY) IVPB 1000 mg/200 mL   Other Relevant Orders  Glucose, CSF (Completed)   Protein, CSF (Completed)   CSF cell count with differential (Completed)   CSF culture w Gram Stain (Completed)   Fungus Culture With Stain (Completed)   HSV(herpes smplx vrs)abs-I+II(IgG)-CSF (Completed)   Anaerobic culture w Gram Stain (Completed)   Respiratory failure (HCC)       Relevant Orders   DG Chest Port 1 [address] (Completed)   DG Chest Port 1 View (Completed)   DG Chest Port 1 View (Completed)   DG Chest Port 1 View (Completed)   DG Chest Port 1 View (Completed)   Fever       Relevant Orders   DG CHEST PORT 1 VIEW (Completed)   Acute respiratory failure with hypoxia (HCC)       Relevant Orders   DG Chest Port 1 View (Completed)     S/p day # 11, Right great toe amputation  -Patient seen and evaluated at bedside -Dressing change performed; applied xeroform and dry dressing to right foot  -Nursing to continue with dressing changes every other day as ordered  -Continue with offloading of heels -Continue with antibiotic management for osteomyelitis   -Continue with comfort measures -Podiatry to follow as needed   Dr. Landis Martins, [place] and [place] [phone] office  [phone] cell   Available via secure chat

## 2020-10-18 DIAGNOSIS — J9601 Acute respiratory failure with hypoxia: Secondary | ICD-10-CM | POA: Diagnosis not present

## 2020-10-18 DIAGNOSIS — A419 Sepsis, unspecified organism: Secondary | ICD-10-CM | POA: Diagnosis not present

## 2020-10-18 DIAGNOSIS — M869 Osteomyelitis, unspecified: Secondary | ICD-10-CM | POA: Diagnosis not present

## 2020-10-18 DIAGNOSIS — R52 Pain, unspecified: Secondary | ICD-10-CM

## 2020-10-18 DIAGNOSIS — R0609 Other forms of dyspnea: Secondary | ICD-10-CM

## 2020-10-18 DIAGNOSIS — E1159 Type 2 diabetes mellitus with other circulatory complications: Secondary | ICD-10-CM

## 2020-10-18 DIAGNOSIS — R627 Adult failure to thrive: Secondary | ICD-10-CM

## 2020-10-18 LAB — GLUCOSE, CAPILLARY
Glucose-Capillary: 122 mg/dL — ABNORMAL HIGH (ref 70–99)
Glucose-Capillary: 132 mg/dL — ABNORMAL HIGH (ref 70–99)
Glucose-Capillary: 141 mg/dL — ABNORMAL HIGH (ref 70–99)
Glucose-Capillary: 150 mg/dL — ABNORMAL HIGH (ref 70–99)
Glucose-Capillary: 153 mg/dL — ABNORMAL HIGH (ref 70–99)

## 2020-10-18 MED ORDER — MORPHINE SULFATE (PF) 2 MG/ML IV SOLN
2.0000 mg | INTRAVENOUS | Status: DC | PRN
  Administered 2020-10-18 – 2020-10-20 (×6): 2 mg via INTRAVENOUS
  Filled 2020-10-18 (×7): qty 1

## 2020-10-18 MED ORDER — GLYCOPYRROLATE 0.2 MG/ML IJ SOLN
0.4000 mg | Freq: Four times a day (QID) | INTRAMUSCULAR | Status: DC
  Administered 2020-10-18 – 2020-10-20 (×9): 0.4 mg via INTRAVENOUS
  Filled 2020-10-18 (×9): qty 2

## 2020-10-18 MED ORDER — LORAZEPAM 2 MG/ML IJ SOLN: 2.0000 mg | INTRAMUSCULAR | Status: DC | PRN

## 2020-10-18 MED ORDER — MORPHINE SULFATE (PF) 2 MG/ML IV SOLN
1.0000 mg | INTRAVENOUS | Status: DC | PRN
  Filled 2020-10-18: qty 1

## 2020-10-18 MED ORDER — SODIUM CHLORIDE 0.9 % IV SOLN: INTRAVENOUS | Status: DC

## 2020-10-18 NOTE — Progress Notes (Signed)
NAME:  Tony [name], MRN:  [id number], DOB:  1951/01/18, LOS: 60 ADMISSION DATE:  10/04/2020, CONSULTATION DATE: 3/8 REFERRING MD: Dr. Rosine Door, CHIEF COMPLAINT: Altered mental status  Brief History:  70 yo male admitted from [facility] on 3/07 with AMS, fever (Tm 101.7), tachycardia, lactic acidosis and hyperglycemia.  Required intubation for airway protection and transferred to ICU on 3/08.  Past Medical History:  Alzheimer's dementia, BPH, CKD 3a, DM type 2, HTN, Seizure disorder, CVA  Significant Hospital Events:  3/7 admitted for sepsis/DKA 3/8 unresponsive, intubated, transferred to ICU 3/10 Right great toe amputation 3/11 Tolerate SBT. Mental status precludes extubation. Cumulative fluid balance + 3/12 Weaned off sedation. Poor mental status. Tolerating SBT 3/13 Fever 3/18 mucus plug episode 3/19 extubated; DNR 3/21 tx to floor  Consults:  Neurology Palliative 3/22>>  Procedures:  ETT 3/8 > 3/19  Significant Diagnostic Tests:   CT head 3/7 > no evidence of acute intracranial abnormality  X-ray right foot 3/7 > no gross findings suspicious of osteomyelitis  MRI right foot 3/8 > osteomyelitis of 1st distal phalanx and 1st proximal phalanx, plantar muscle edema concerning for myositis  CT head 3/8 > remote b/l cerebellar infarcts  CT abdomen pelvis 3/8 > bibasilar consolidation, heavy stool burden throughout colon, diverticulosis of descending and sigmoid colon  LP 3/8 > glucose 139, protein 113, RBC 48, WBC 6  EEG 3/8 > generalized slowing  EEG 3/10 > No evidence of epileptogenicity  MRI R foot 3/15 >> no apparent osteo-, no abscess  Micro Data:  COVID/Flu 3/7 > negative MRSA PCR 3/7 > negative Blood culture 3/7 > Urine culture 3/7 > multiple species tracheal aspirate 3/8 > CSF 3/8 > negative Right bone/wound culture 3/10 > negative Blood 3/13 >> negative  Antimicrobials:  Cefepime 3/7 > 3/8 Vancomycin 3/7 > 3/8 Zosyn 3/8 > 3/16 Daptomycin  3/8 > 3/16 Vancomycin 3/16 >3/21 Ceftriaxone 3/16 > 3/21  Interim History / Subjective:  Appears. comfortable  Objective   Blood pressure 134/76, pulse (!) 105, temperature 98.8 F (37.1 C), temperature source Oral, resp. rate 20, height 6' (1.829 m), weight 77.8 kg, SpO2 99 %.        Intake/Output Summary (Last 24 hours) at 10/18/2020 0750 Last data filed at 10/18/2020 4098 Gross per 24 hour  Intake 1355.07 ml  Output 2425 ml  Net -1069.93 ml   Filed Weights   10/15/20 0413 10/16/20 0500 10/17/20 0500  Weight: 81 kg 81.4 kg 77.8 kg   Physical Exam:  General - lethargic but arousable Eyes -PERL ENT -Poor dentition, gurgling respirations   Cardiac -HSR Chest - Decreased air movment Abdomen - soft, non tender, + bowel sounds Extremities - 1+ edema Skin - Right foot bandage intact Neuro - Arousal to voice   Resolved Hospital Problem list   Hypernatremia, DKA, AKI from ATN secondary to sepsis, Lactic acidosis  Assessment:   Acute hypoxic respiratory failure with compromised airway in setting of aspiration pneumonitis. Sepsis from aspiration pneumonia and chronic osteomyelitis of Rt foot s/p right great toe amputation 1/19 Acute metabolic encephalopathy from sepsis, hyperglycemia. Hx of seizures, Alzheimer dementia, CVA. DM type 2 poorly controlled with hyperglycemia. CKD 3a. Hx of BPH with urine retention. Anemia of critical illness and chronic disease. Thrombocytosis. Hx of HTN.  Plan:   Tx to palliative floor DNR Continue  Depacon Continue SSI protocol Currently full comfort Await palliative care consult PRN MSO4   Signature:  Richardson Landry Minor ACNP Acute Care Nurse Practitioner  [place] Please consult Amion 10/18/2020, 7:54 AM

## 2020-10-18 NOTE — Consult Note (Signed)
Consultation Note Date: 10/18/2020   Patient Name: Tony [name]  DOB: 1950-08-21  MRN: [id number]  Age / Sex: 70 y.o., male  PCP: Patient, No Pcp Per Referring Physician: Chesley Mires, MD  Reason for Consultation: Establishing goals of care and Psychosocial/spiritual support  HPI/Patient Profile: 70 y.o. male  admitted on 10/16/2020 with    Alzheimer's disease, type 2 diabetes mellitus, CKD stage III, seizure disorder, hypertension who [facility] for altered mental status.    Patient required intubation for airway protection at that time. Core track for nutritional support during this hospitalization.   Patient at baseline is conversational, recognizes familiar faces but disoriented to time or place due to his dementia.  Patient does not have healthcare decisional capacity at this time.  Patient's only brother Tony [name] is his H POA.   Patient's has continued to decline in spite of maximal medical support. Today is day 15 of this hospital stay .   Discussion with CCM family has begun to shift to a more comfort approach.  Palliative medicine consulted for symptom management and emotional support.  Family face treatment option decisions, advanced directive decisions and anticipatory care needs.   Clinical Assessment and Goals of Care:   This NP Wadie Lessen reviewed medical records, received report from team, assessed the patient and then spoke to his brother/Tony Iman by phone to discuss diagnosis, prognosis, GOC, EOL wishes disposition and options.   Concept of Palliative Care was introduced as specialized medical care for people and their families living with serious illness.  If focuses on providing relief from the symptoms and stress of a serious illness.  The goal is to improve quality of life for both the patient and the family.   Values and goals of care important to patient and family  were attempted to be elicited.  Created space and opportunity for family to explore thoughts and feelings regarding current medical situation.  Patient's brother verbalizes a clear understanding of the current medical situation and the limited prognosis.  At this time the main focus of care is comfort and dignity for his brother at end-of-life.   A  discussion was had today regarding advanced directives.  The difference between a aggressive medical intervention path  and a palliative comfort care path for this patient at this time, in this situation  was had.   Offered to meet brother at bedside today but his schedule prohibits meeting.  He is encouraged to call with questions or concerns.  Palliative medicine will continue to support holistically   Natural trajectory and expectations at EOL were discussed.    HCPOA    SUMMARY OF RECOMMENDATIONS    Code Status/Advance Care Planning:  DNR    Symptom Management:   Pain/Dyspnea: Morphine 2 mg IV every one hr prn   Agitation: Ativan 2 mg IV every 4 hrs prn  Terminal secretion: Robinul 0.4 mg four times a day   Palliative Prophylaxis:   Eye Care, Frequent Pain Assessment, Oral Care and Palliative Wound Care  Additional Recommendations (Limitations, Scope, Preferences):  Full Comfort Care  Psycho-social/Spiritual:   Desire for further Chaplaincy support:yes  Additional Recommendations: Education on Hospice  Prognosis:   Hours - Days  Discharge Planning: Anticipated Hospital Death      Primary Diagnoses: Present on Admission: . Altered mental status . Sepsis ([place]) . Hypertension . Acute renal failure superimposed on stage 3b chronic kidney disease ([place]) . Dementia ([place])   I have reviewed the medical record, interviewed the patient and family, and examined the patient. The following aspects are pertinent.  Past Medical History:  Diagnosis Date  . Alzheimer's disease ([place])   . BPH (benign prostatic  hyperplasia)   . CKD (chronic kidney disease)   . Diabetes mellitus    Type II, diagnosed September 19, 2009, not on insulin; admitted in 09-19-2009 for hyperglycemia  . HTN (hypertension)   . Seizure disorder ([place])    diagnosed in childhood, last seizure was years ago   Social History   Socioeconomic History  . Marital status: Widowed    Spouse name: Not on file  . Number of children: 2  . Years of education: 39  . Highest education level: Not on file  Occupational History  . 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 Smoking status: Never Smoker  . Smokeless tobacco: Never Used  Substance and Sexual Activity  . Alcohol use: No    Alcohol/week: 0.0 standard drinks  . Drug use: No  . Sexual activity: Never    Partners: Female    Comment: wife passed away in Aug 19, 2010  Other Topics Concern  . Not on file  Social History Narrative   Lives in [place] with son   His wife passed away in 2010-09-19       did not complete HS. He has had seizures since adolescence. 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 He got his license and per his brother had a more normal life. He stopped driving 2 years ago. At that time he had the onset of dementia which has been progressive. He lives in his own home and his son, who is disabled, lives with him. His Brother Tony, who lives in [place], looks after their affairs. Tony has been working to keep them at home. Tony [name] is enrolled in [facility] and was attending adult day care until a recent decline attributed to his mother's death.   Social Determinants of Health   Financial Resource Strain: Not on file  Food Insecurity: Not on file  Transportation Needs: Not on file  Physical Activity: Not on file  Stress: Not on file  Social Connections: Not on file   Family History  Problem Relation Age of Onset  . Diabetes Mother   . Hypertension Mother    Scheduled  Meds: . chlorhexidine gluconate (MEDLINE KIT)  15 mL Mouth Rinse BID  . Chlorhexidine Gluconate Cloth  6 each Topical Daily  . enoxaparin (LOVENOX) injection  40 mg Subcutaneous Q24H  . insulin aspart  0-20 Units Subcutaneous Q4H  . mupirocin ointment  1 application Topical BID  . sodium chloride flush  10-40 mL Intracatheter Q12H   Continuous Infusions: . sodium chloride Stopped (10/15/2020 0609)  . dextrose 50 mL/hr at 10/18/20 0528  . valproate sodium 500 mg (10/18/20 0559)   PRN Meds:.sodium chloride, acetaminophen (TYLENOL) oral liquid 160 mg/5 mL **OR** acetaminophen, dextrose,  fentaNYL (SUBLIMAZE) injection, LORazepam, morphine injection, [DISCONTINUED] ondansetron **OR** ondansetron (ZOFRAN) IV, sodium chloride flush Medications Prior to Admission:  Prior to Admission medications   Medication Sig Start Date End Date Taking? Authorizing Provider  Acetaminophen (MAPAP ACETAMINOPHEN EXTRA STR) 167 MG/5ML LIQD Take 15 mLs by mouth every 8 (eight) hours. For Myositis   Yes [provider]  acetaminophen (TYLENOL) 650 MG CR tablet Take 650 mg by mouth every 8 (eight) hours as needed for pain.   Yes [provider]  amLODipine (NORVASC) 2.5 MG tablet Take 2.5 mg by mouth daily.   Yes [provider]  cabergoline (DOSTINEX) 0.5 MG tablet Take 0.25 mg by mouth once a week.   Yes [provider]  divalproex (DEPAKOTE SPRINKLE) 125 MG capsule Take 500 mg by mouth in the morning, at noon, and at bedtime.   Yes [provider]  donepezil (ARICEPT) 10 MG tablet Take 10 mg by mouth at bedtime.   Yes [provider]  famotidine (PEPCID) 20 MG tablet Take 20 mg by mouth 2 (two) times daily.   Yes [provider]  melatonin 3 MG TABS tablet Take 3 mg by mouth at bedtime.   Yes [provider]  memantine (NAMENDA) 10 MG tablet Take 10 mg by mouth 2 (two) times daily.   Yes [provider]  Menthol, Topical Analgesic,  (BIOFREEZE) 4 % GEL Apply 1 application topically 2 (two) times daily as needed (for back pain).   Yes [provider]  Multiple Vitamin (MULTIVITAMIN WITH MINERALS) TABS tablet Take 1 tablet by mouth daily.   Yes [provider]  PARoxetine (PAXIL-CR) 12.5 MG 24 hr tablet Take 12.5 mg by mouth daily.   Yes [provider]  tamsulosin (FLOMAX) 0.4 MG CAPS capsule Take 1 capsule (0.4 mg total) by mouth daily after breakfast. 12/16/18  Yes Masoudi, Elhamalsadat, MD  Vitamin D, Ergocalciferol, 50 MCG (2000 UT) CAPS Take 5,000 Units by mouth daily.   Yes [provider]  mupirocin ointment (BACTROBAN) 2 % Apply 1 application topically 2 (two) times daily. Patient not taking: Reported on 10/04/2020 07/21/20   Criselda Peaches, DPM   No Known Allergies Review of Systems  Unable to perform ROS: Acuity of condition    Physical Exam Constitutional:      Appearance: He is underweight. He is ill-appearing.  Cardiovascular:     Rate and Rhythm: Normal rate.  Pulmonary:     Breath sounds: Decreased air movement present.  Skin:    General: Skin is warm and dry.  Neurological:     Mental Status: He is lethargic.     Vital Signs: BP (!) 135/52 (BP Location: Left Arm)   Pulse 99   Temp 98.4 F (36.9 C) (Oral)   Resp 18   Ht 6' (1.829 m)   Wt 77.8 kg   SpO2 100%   BMI 23.26 kg/m  Pain Scale: PAINAD   Pain Score: 0-No pain   SpO2: SpO2: 100 % O2 Device:SpO2: 100 % O2 Flow Rate: .O2 Flow Rate (L/min): 1.5 L/min  IO: Intake/output summary:   Intake/Output Summary (Last 24 hours) at 10/18/2020 1138 Last data filed at 10/18/2020 0815 Gross per 24 hour  Intake 969.62 ml  Output 2425 ml  Net -1455.38 ml    LBM: Last BM Date: 10/15/20 Baseline Weight: Weight: 84.5 kg Most recent weight: Weight: 77.8 kg     Palliative Assessment/Data: 30 % at best    Discussed with Dr Halford Chessman  via secure chat  Time In: 1200 Time Out: 1310 Time Total: 70  minutes Greater than 50%  of this time was spent counseling and coordinating care related to the above assessment and plan.  Signed by: Wadie Lessen, NP   Please contact Palliative Medicine Team phone at [phone] for questions and concerns.  For individual provider: See Shea Evans

## 2020-10-19 DIAGNOSIS — Z515 Encounter for palliative care: Secondary | ICD-10-CM

## 2020-10-19 DIAGNOSIS — M869 Osteomyelitis, unspecified: Secondary | ICD-10-CM | POA: Diagnosis not present

## 2020-10-19 DIAGNOSIS — J9601 Acute respiratory failure with hypoxia: Secondary | ICD-10-CM | POA: Diagnosis not present

## 2020-10-19 DIAGNOSIS — J9621 Acute and chronic respiratory failure with hypoxia: Secondary | ICD-10-CM | POA: Diagnosis not present

## 2020-10-19 NOTE — Progress Notes (Signed)
Patient ID: Tony [name], male   DOB: [birth year], 70 y.o.   MRN: [id number]  Medical records reviewed.    70 y.o. male  admitted on 10/18/2020 with  Alzheimer's disease, type 2 diabetes mellitus, CKD stageIII, seizure disorder, hypertension who presents from [facility] for altered mental status.   Patient required intubation for airway protection at that time. Core track for nutritional support during this hospitalization.   Patient at baseline is conversational, recognizes familiar faces but disoriented to time or place due to his dementia.  Patient does not have healthcare decisional capacity at this time.  Patient's only brother [name] is his H POA.   Patient's has continued to decline in spite of maximal medical support. Today is day 15 of this hospital stay .   Discussion with CCM family has begun to shift to a more comfort approach.  Palliative medicine consulted for symptom management and emotional support.  Family has made decision to focus on comfort and dignity, no life prolonging measures allowing for a natural death.    This NP visited patient at the bedside as a follow up for palliative medicine needs and emotional support.    Patient appears comfortable, he is minimally responsive but does open his eyes when I call his name.  Spoke to his brother/[name] and main Media planner by telephone.  [name] verbalizes a clear understanding that his brother is transitioning at end-of-life.  The main focus of care is comfort and dignity.  Education on natural trajectory and expectations at end-of-life.  Emotional support offered.  Questions and concerns addressed    Discussed with bedside Rn  Total time spent on the unit was 25 minutes      Greater than 50% of the time was spent in counseling and coordination of care  Wadie Lessen NP  Palliative Medicine Team Team Phone # [phone] Pager [phone]

## 2020-10-19 NOTE — Care Management Important Message (Signed)
Important Message  Patient Details  Name: Tony [name] MRN: [id number] Date of Birth: [birth year]   Medicare Important Message Given:  Yes     Deem Marmol Montine Circle 10/19/2020, 3:57 PM

## 2020-10-19 NOTE — Progress Notes (Signed)
NAME:  Tony [name], MRN:  [id number], DOB:  07-17-1951, LOS: 15 ADMISSION DATE:  10/17/2020, CONSULTATION DATE: 3/8 REFERRING MD: Dr. Rosine Door, CHIEF COMPLAINT: Altered mental status  Brief History:  70 yo male admitted from [facility] on 3/07 with AMS, fever (Tm 101.7), tachycardia, lactic acidosis and hyperglycemia.  Required intubation for airway protection and transferred to ICU on 3/08.  Past Medical History:  Alzheimer's dementia, BPH, CKD 3a, DM type 2, HTN, Seizure disorder, CVA  Significant Hospital Events:  3/7 admitted for sepsis/DKA 3/8 unresponsive, intubated, transferred to ICU 3/10 Right great toe amputation 3/11 Tolerate SBT. Mental status precludes extubation. Cumulative fluid balance + 3/12 Weaned off sedation. Poor mental status. Tolerating SBT 3/13 Fever 3/18 mucus plug episode 3/19 extubated; DNR 3/21 tx to floor  Consults:  Neurology Palliative 3/22>>  Procedures:  ETT 3/8 > 3/19  Significant Diagnostic Tests:   CT head 3/7 > no evidence of acute intracranial abnormality  X-ray right foot 3/7 > no gross findings suspicious of osteomyelitis  MRI right foot 3/8 > osteomyelitis of 1st distal phalanx and 1st proximal phalanx, plantar muscle edema concerning for myositis  CT head 3/8 > remote b/l cerebellar infarcts  CT abdomen pelvis 3/8 > bibasilar consolidation, heavy stool burden throughout colon, diverticulosis of descending and sigmoid colon  LP 3/8 > glucose 139, protein 113, RBC 48, WBC 6  EEG 3/8 > generalized slowing  EEG 3/10 > No evidence of epileptogenicity  MRI R foot 3/15 >> no apparent osteo-, no abscess  Micro Data:  COVID/Flu 3/7 > negative MRSA PCR 3/7 > negative Blood culture 3/7 > Urine culture 3/7 > multiple species tracheal aspirate 3/8 > CSF 3/8 > negative Right bone/wound culture 3/10 > negative Blood 3/13 >> negative  Antimicrobials:  Cefepime 3/7 > 3/8 Vancomycin 3/7 > 3/8 Zosyn 3/8 > 3/16 Daptomycin  3/8 > 3/16 Vancomycin 3/16 >3/21 Ceftriaxone 3/16 > 3/21  Interim History / Subjective:  He denies complaints- no SOB or pain.  Objective   Blood pressure (!) 116/49, pulse (!) 110, temperature 98.4 F (36.9 C), temperature source Oral, resp. rate 19, height 6' (1.829 m), weight 77.8 kg, SpO2 97 %.        Intake/Output Summary (Last 24 hours) at 10/19/2020 7209 Last data filed at 10/19/2020 0500 Gross per 24 hour  Intake 728.21 ml  Output 1025 ml  Net -296.79 ml   Filed Weights   10/15/20 0413 10/16/20 0500 10/17/20 0500  Weight: 81 kg 81.4 kg 77.8 kg   Physical Exam:  General - ill appearing elderly man laying in bed in NAD Eyes - anicteric ENT -[place]/AT, oral mucosa moist, poor dentition Cardiac - S1S2, RRR Chest - rhonchi bilaterally, mild tachypnea, no accessory muscle use Abdomen - soft, NT, ND Extremities - no cyanosis or edema Skin - warm, dry Neuro - awake, tracking with eyes, answering yes and no questions    Resolved Hospital Problem list   Hypernatremia, DKA, AKI from ATN secondary to sepsis, Lactic acidosis  Assessment:   Acute hypoxic respiratory failure with compromised airway due to aspiration pneumonitis. Sepsis from aspiration pneumonia and chronic osteomyelitis, myositis of Rt foot s/p right great toe amputation 4/70 Acute metabolic encephalopathy from sepsis, hyperglycemia. Hx of seizures, Alzheimer dementia, CVA. DM type 2 poorly controlled with hyperglycemia. CKD 3a. Hx of BPH with urine retention. Anemia of critical illness and chronic disease. Thrombocytosis. Hx of HTN.  Plan:   -Con't to focus on comfort. Morphine, ativan  PRN for anxiety, air hunger, or pain. -robinul to assist with secretions as needed -liberalized visitation -Appreciate Palliative Care team's input.  -Con't epilepsy meds- depakote, ativan PRN for seizures. -Holding accuchecks and SSI given lack of perceived benefit at this stage and relative discomfort associated with  this intervention  Signature:    Julian Hy, DO 10/19/20 1:09 PM [facility]  From [place] if no response to pager, please call [phone]. After hours, 7PM- 7AM, please call Elink  [phone].

## 2020-10-19 NOTE — Progress Notes (Signed)
Nutrition Brief Note  Chart reviewed. Pt now transitioning to comfort care. Recommend comfort feeds No further nutrition interventions warranted at this time.  Please re-consult as needed.   Kerman Passey MS, RDN, LDN, CNSC Registered Dietitian III Clinical Nutrition RD Pager and On-Call Pager Number Located in [place]

## 2020-10-20 DIAGNOSIS — G9340 Encephalopathy, unspecified: Secondary | ICD-10-CM

## 2020-10-20 MED ORDER — VALPROATE SODIUM 100 MG/ML IV SOLN
500.0000 mg | Freq: Two times a day (BID) | INTRAVENOUS | Status: DC
  Administered 2020-10-20 – 2020-10-21 (×2): 500 mg via INTRAVENOUS
  Filled 2020-10-20 (×3): qty 5

## 2020-10-20 NOTE — Progress Notes (Signed)
Patient respirations 30 per min, morphine given for dyspnea.  Repositioned for comfort, clean and dry at this time. 2 L oxygen via nasal cannula in place.   Music playing per family request.

## 2020-10-20 NOTE — Progress Notes (Signed)
Respirations 30 per minute, shallow.  HR 150-160s.  Patient grimaces to pain, does not verbally respond.  Room temp adjusted, patient positioned for comfort.  Update given to brother [name] via phone.

## 2020-10-20 NOTE — Progress Notes (Signed)
Patient ID: AJAX SCHROLL, male   DOB: 25-Feb-1951, 70 y.o.   MRN: [id number]  Medical records reviewed.    70 y.o. male  admitted on 10/01/2020 with  Alzheimer's disease, type 2 diabetes mellitus, CKD stageIII, seizure disorder, hypertension who presents from [facility] for altered mental status.   Patient required intubation for airway protection at that time. Core track for nutritional support during this hospitalization.  Patient's only brother [name] is his H POA.   Patient's has continued to decline in spite of maximal medical support.   Family made decision to focus on comfort and dignity, no life prolonging measures allowing for a natural death.    This NP visited patient at the bedside as a follow up for palliative medicine needs and emotional support.    Patient appears comfortable, he is unresponsive to gentle touch and verbal stimuli    Spoke to his brother/[name] and main decision maker by telephone.  [name] verbalizes a clear understanding that his brother is transitioning at end-of-life.  The main focus of care is comfort and dignity.  Symptom management medications in place.   Prognosis is likely hours to days.   I left my personal contact information for [name] to call with any questions or needs.   Emotional support offered.  Discussed with bedside RN  Total time spent on the unit was 25 minutes      Greater than 50% of the time was spent in counseling and coordination of care  Wadie Lessen NP  Palliative Medicine Team Team Phone # [phone] Pager [phone]

## 2020-10-20 NOTE — Progress Notes (Signed)
NAME:  Tony [name], MRN:  [id number], DOB:  [birth year], LOS: 46 ADMISSION DATE:  10/17/2020, CONSULTATION DATE: 3/8 REFERRING MD: Dr. Rosine Door, CHIEF COMPLAINT: Altered mental status  Brief History:  70 yo male admitted from [facility] on 3/07 with AMS, fever (Tm 101.7), tachycardia, lactic acidosis and hyperglycemia.  Required intubation for airway protection and transferred to ICU on 3/08.  Past Medical History:  Alzheimer's dementia, BPH, CKD 3a, DM type 2, HTN, Seizure disorder, CVA  Significant Hospital Events:  3/7 admitted for sepsis/DKA 3/8 unresponsive, intubated, transferred to ICU 3/10 Right great toe amputation 3/11 Tolerate SBT. Mental status precludes extubation. Cumulative fluid balance + 3/12 Weaned off sedation. Poor mental status. Tolerating SBT 3/13 Fever 3/18 mucus plug episode 3/19 extubated; DNR 3/21 tx to floor  Consults:  Neurology Palliative 3/22>>  Procedures:  ETT 3/8 > 3/19  Significant Diagnostic Tests:   CT head 3/7 > no evidence of acute intracranial abnormality  X-ray right foot 3/7 > no gross findings suspicious of osteomyelitis  MRI right foot 3/8 > osteomyelitis of 1st distal phalanx and 1st proximal phalanx, plantar muscle edema concerning for myositis  CT head 3/8 > remote b/l cerebellar infarcts  CT abdomen pelvis 3/8 > bibasilar consolidation, heavy stool burden throughout colon, diverticulosis of descending and sigmoid colon  LP 3/8 > glucose 139, protein 113, RBC 48, WBC 6  EEG 3/8 > generalized slowing  EEG 3/10 > No evidence of epileptogenicity  MRI R foot 3/15 >> no apparent osteo-, no abscess  Micro Data:  COVID/Flu 3/7 > negative MRSA PCR 3/7 > negative Blood culture 3/7 > Urine culture 3/7 > multiple species tracheal aspirate 3/8 > CSF 3/8 > negative Right bone/wound culture 3/10 > negative Blood 3/13 >> negative  Antimicrobials:  Cefepime 3/7 > 3/8 Vancomycin 3/7 > 3/8 Zosyn 3/8 > 3/16 Daptomycin  3/8 > 3/16 Vancomycin 3/16 >3/21 Ceftriaxone 3/16 > 3/21  Interim History / Subjective:  More somnolent, not answering questions this morning.  Objective   Blood pressure (!) 109/53, pulse (!) 105, temperature 99.5 F (37.5 C), temperature source Oral, resp. rate 14, height 6' (1.829 m), weight 77.8 kg, SpO2 94 %.        Intake/Output Summary (Last 24 hours) at 10/20/2020 1028 Last data filed at 10/20/2020 0456 Gross per 24 hour  Intake 220 ml  Output 1050 ml  Net -830 ml   Filed Weights   10/15/20 0413 10/16/20 0500 10/17/20 0500  Weight: 81 kg 81.4 kg 77.8 kg   Physical Exam:  General - chronically ill appearing man laying in bed in NAD, sleeping Eyes - anicteric ENT -[place]/AT, eyes anicteric. Poor dentition.  Cardiac - S1S2, RRR Chest -  Mild rhonchi bilaterally. Mild tachypnea, no accessory muscle use. Saturating in 70s on 1L [place]. Abdomen - soft, NT Extremities - no edema, no cyanosis Skin - warm, dry, no rashes Neuro - Somnolent, not responding to verbal stimulation today, briefly opens eyes to physical exam but falls back asleep quickly.   Resolved Hospital Problem list   Hypernatremia, DKA, AKI from ATN secondary to sepsis, Lactic acidosis  Assessment:   Acute hypoxic respiratory failure with compromised airway due to aspiration pneumonitis. Sepsis from aspiration pneumonia and chronic osteomyelitis, myositis of Rt foot s/p right great toe amputation 8/31 Acute metabolic encephalopathy from sepsis, hyperglycemia. Hx of seizures, Alzheimer dementia, CVA. DM type 2 poorly controlled with hyperglycemia. CKD 3a. Hx of BPH with urine retention. Anemia of critical illness  and chronic disease. Thrombocytosis. Hx of HTN. Actively dying  Plan:   -Continuing focus on comfort- has morphine PRN for SOB, air hunger, pain. -Ativan PRN for seizures. Con't depakote. -con't glycopyrrolate for oral secretions PRN -robinul to assist with secretions as needed -liberalized  family vistion; brother has requested to have him kept on continuous pulse oximetry to help with timing of visitation -Appreciate Palliative Care team's assistance -Holding accuchecks and SSI given lack of perceived benefit at this stage and relative discomfort associated with this intervention  Signature:    Julian Hy, DO 10/20/20 10:43 AM [facility]  From 7AM- 7PM if no response to pager, please call [phone]. After hours, 7PM- 7AM, please call Elink  [phone].

## 2020-10-21 MED ORDER — SCOPOLAMINE 1 MG/3DAYS TD PT72
1.0000 | MEDICATED_PATCH | TRANSDERMAL | Status: DC
  Administered 2020-10-21: 1.5 mg via TRANSDERMAL
  Filled 2020-10-21: qty 1

## 2020-10-28 NOTE — Death Summary Note (Signed)
DEATH SUMMARY   Patient Details  Name: Tony [name] MRN: [id number] DOB: 29-Apr-1951  Admission/Discharge Information   Admit Date:  Oct 15, 2020  Date of Death: Date of Death: 11-02-20  Time of Death: Time of Death: 28-Oct-1125  Length of Stay: 2022-10-27  Referring Physician: Patient, No Pcp Per   Reason(s) for Hospitalization  Septic shock due to diabetic foot wound   Diagnoses  Preliminary cause of death:   Acute respiratory failure with hypoxia  Secondary Diagnoses (including complications and co-morbidities):  Principal Problem:   Osteomyelitis of great toe ([place]) Active Problems:   Hypertension   Dementia ([place])   Acute renal failure superimposed on stage 3b chronic kidney disease (HCC)   Sepsis ([place])   Altered mental status   DKA (diabetic ketoacidosis) (HCC)   Ulcer of great toe, right, with unspecified severity ([place])   Unresponsive   Endotracheal tube present   Acute osteomyelitis of toe, right (HCC)   Acute on chronic respiratory failure with hypoxia (HCC) Aspiration pneumonitis Diabetic foot wound- osteomyelitis and myositis [id number] Acute metabolic encephalopathy History of epilepsy   Brief Hospital Course (including significant findings, care, treatment, and services provided and events leading to death)  Tony [name] is a 70 y.o. year old male who was admitted with DKA and septic shock due to foot osteomyelitis and myositis who eventually required amputation on 10/12/2020. He was continued on antibiotics. His sepsis was complicated by shock and acute encephalopathy requiring MV for pending respiratory failure from lack of ability to protect his airway. He had a prolonged time on mechanical ventilation and eventually was terminally extubated. He had progressive encephalopathy and inability to maintain his airway. Due to progressive respiratory failure, his family decided to change the focus of his care to comfort rather than aggressive care measures. Palliative care was involved in  his care. Mr. Scheer expired on November 02, 2020 at 11:27 AM with family at bedside.  DKA- managed with volume resuscitation and insulin.  Epilepsy- PTA AEDs continued.  Acute metabolic encephalopathy- delirium precautions Dementia- continued PTA aricept  HTN- managed with oral antihypertensives.  Pertinent Labs and Studies  Significant Diagnostic Studies CT ABDOMEN PELVIS WO CONTRAST  Result Date: 10/04/2020 CLINICAL DATA:  Abdominal pain, intubation EXAM: CT ABDOMEN AND PELVIS WITHOUT CONTRAST TECHNIQUE: Multidetector CT imaging of the abdomen and pelvis was performed following the standard protocol without IV contrast. COMPARISON:  None. FINDINGS: Lower chest: Dense consolidation of the left greater than right bilateral lung bases. Hepatobiliary: No solid liver abnormality is seen. No gallstones, gallbladder wall thickening, or biliary dilatation. Pancreas: Unremarkable. No pancreatic ductal dilatation or surrounding inflammatory changes. Spleen: Normal in size without significant abnormality. Adrenals/Urinary Tract: Adrenal glands are unremarkable. Multiple low-attenuation renal lesions, incompletely characterized but likely cysts. Kidneys are otherwise normal, without renal calculi, solid lesion, or hydronephrosis. There is layering, high attenuation material within the urinary bladder lumen (series 3, image 79, series 7, image 92). Stomach/Bowel: Stomach is within normal limits. Appendix appears normal. No evidence of bowel wall thickening, distention, or inflammatory changes. Descending and sigmoid diverticulosis. Moderate burden of stool throughout the colon with large stool ball in the rectum measuring 7.9 cm. Vascular/Lymphatic: No significant vascular findings are present. No enlarged abdominal or pelvic lymph nodes. Reproductive: No mass or other significant abnormality. Other: No abdominal wall hernia or abnormality. No abdominopelvic ascites. Musculoskeletal: No acute or significant osseous  findings. IMPRESSION: 1. Dense consolidation of the left greater than right bilateral bilateral lung bases, most consistent with infection or aspiration. 2.  Moderate burden of stool throughout the colon with large stool ball in the rectum measuring 7.9 cm. Correlate for fecal impaction. 3. There is layering, high attenuation material within the urinary bladder lumen, which may reflect blood product or excreted contrast. Correlate with urinalysis. 4. Descending and sigmoid diverticulosis without evidence of acute diverticulitis. Electronically Signed   By: Eddie Candle M.D.   On: 10/04/2020 08:58   DG Abd 1 View  Result Date: 10/04/2020 CLINICAL DATA:  70 year old male in need of MRI.  Toe infection. EXAM: ABDOMEN - 1 VIEW COMPARISON:  CT Abdomen and Pelvis 06/25/2019 and earlier. FINDINGS: Portable AP supine view at 0310 hours. Small 4 mm mildly lobulated radiopaque object projects over the left mid abdomen, new from the 2020 CT. This somewhat resembles a dental filling or crown. No other radiopaque foreign body identified. Non obstructed bowel gas pattern. Retained stool in the rectum is new from the prior CT. No acute osseous abnormality identified. IMPRESSION: 1. Solitary, small 4 mm radiopaque object projects over the left mid abdomen, new from the 2020 and somewhat resembles an ingested dental filling or crown. This does not contraindicate MRI. 2. No other radiopaque foreign body identified. Electronically Signed   By: Genevie Ann M.D.   On: 10/04/2020 04:23   CT HEAD WO CONTRAST  Result Date: 10/04/2020 CLINICAL DATA:  Mental status change. EXAM: CT HEAD WITHOUT CONTRAST TECHNIQUE: Contiguous axial images were obtained from the base of the skull through the vertex without intravenous contrast. COMPARISON:  October 03, 2020. FINDINGS: Brain: No evidence of acute large vascular territory infarction, hemorrhage, hydrocephalus, extra-axial collection or mass lesion/mass effect. Remote bilateral cerebellar infarcts.  Vascular: Calcific atherosclerosis. No hyperdense vessel identified. Skull: No acute fracture. Sinuses/Orbits: Visualized sinuses are clear.  Unremarkable orbits. Other: No mastoid effusions. IMPRESSION: 1. No evidence of acute intracranial abnormality. 2. Bilateral remote cerebellar infarcts. Electronically Signed   By: Margaretha Sheffield MD   On: 10/04/2020 08:43   CT Head Wo Contrast  Result Date: 10/12/2020 CLINICAL DATA:  Delirium. EXAM: CT HEAD WITHOUT CONTRAST TECHNIQUE: Contiguous axial images were obtained from the base of the skull through the vertex without intravenous contrast. COMPARISON:  CT head Dec 24, 2019. FINDINGS: Motion limited study. Brain: No evidence of acute large vascular territory infarction, hemorrhage, hydrocephalus, extra-axial collection or mass lesion/mass effect. Similar remote bilateral cerebellar infarcts. Vascular: Calcific atherosclerosis. No hyperdense vessel identified. Skull: No acute fracture. Sinuses/Orbits: Clear sinuses. Other: No mastoid effusions. IMPRESSION: No evidence of acute intracranial abnormality on this motion limited study. Electronically Signed   By: Margaretha Sheffield MD   On: 10/18/2020 18:18   MR FOOT RIGHT W WO CONTRAST  Result Date: 10/11/2020 CLINICAL DATA:  History of right great toe amputation on 10/27/2020 EXAM: MRI OF THE RIGHT FOREFOOT WITHOUT AND WITH CONTRAST TECHNIQUE: Multiplanar, multisequence MR imaging of the right forefoot was performed before and after the administration of intravenous contrast. CONTRAST:  8.70mL GADAVIST GADOBUTROL 1 MMOL/ML IV SOLN COMPARISON:  MRI 10/04/2020 FINDINGS: Bones/Joint/Cartilage Interval postsurgical changes of right great toe amputation at the level of the first MTP joint. Remaining osseous structures are intact. No bone marrow edema, erosion, or periostitis. No joint effusions. Ligaments Collateral ligaments of the lesser MTP joints are intact. Intact Lisfranc ligament. Muscles and Tendons Similar mild  diffuse intramuscular edema, nonspecific. Post amputation changes of the flexor and extensor tendons of the great toe. Remaining tendinous structures intact. No tenosynovitis. Soft tissues Expected postoperative changes overlying the great toe amputation site.  Mild dorsal subcutaneous edema. No soft tissue ulceration. No fluid collection. No abnormal postcontrast enhancement. IMPRESSION: 1. Interval postsurgical changes of right great toe amputation at the level of the first MTP joint. 2. No acute osseous abnormality or evidence of osteomyelitis. 3. Mild dorsal subcutaneous edema.  No fluid collection or abscess. 4. Similar mild diffuse intramuscular edema, suggesting denervation or myositis. Electronically Signed   By: Davina Poke D.O.   On: 10/11/2020 16:02   MR FOOT RIGHT W WO CONTRAST  Result Date: 10/04/2020 CLINICAL DATA:  History of right toe infection and ulceration. Right toe ulceration appears healed, EXAM: MRI OF THE RIGHT FOREFOOT WITHOUT AND WITH CONTRAST TECHNIQUE: Multiplanar, multisequence MR imaging of the right forefoot was performed before and after the administration of intravenous contrast. CONTRAST:  9 mL Gadavist COMPARISON:  None. FINDINGS: Bones/Joint/Cartilage Severe bone marrow edema in the first distal phalanx with cortical destruction of the tuft and avid enhancement. Severe bone marrow edema in the first proximal phalanx with avid enhancement. Overall appearance is consistent with osteomyelitis. No fracture or dislocation. Normal alignment. No joint effusion. Ligaments Collateral ligaments are intact.  Lisfranc ligament is intact. Muscles and Tendons Flexor, peroneal and extensor compartment tendons are intact. Muscle edema of the plantar musculature. Soft tissue No fluid collection or hematoma.  No soft tissue mass. IMPRESSION: 1. Osteomyelitis of the first distal phalanx and first proximal phalanx. No drainable fluid collection to suggest an abscess. 2. Muscle edema of the  plantar musculature concerning for myositis Electronically Signed   By: Kathreen Devoid   On: 10/04/2020 08:32   DG Chest Port 1 View  Result Date: 10/15/2020 CLINICAL DATA:  Respiratory failure EXAM: PORTABLE CHEST 1 VIEW COMPARISON:  Radiograph 10/12/2020 FINDINGS: Patient is rotated in a right anterior oblique which likely accounts for the positioning of the endotracheal tube lateral to the transesophageal tube. Endotracheal tube tip terminates in the mid trachea, approximately 5.2 cm from the expected location of the carina. Transesophageal tube tip and side port terminate distal to the GE junction. Right upper extremity PICC tip terminates in the mid SVC. Telemetry leads overlie the chest. Persistent basilar predominant opacities bilaterally with layering left pleural effusion. No visible right effusion. No pneumothorax. Stable cardiomediastinal contours. No acute osseous or soft tissue abnormality. IMPRESSION: 1. Persistent basilar predominant opacities with layering left effusion. 2. Lines and tubes appear to be in satisfactory positioning accounting for patient rotation. Electronically Signed   By: Lovena Le M.D.   On: 10/15/2020 06:30   DG Chest Port 1 View  Result Date: 10/12/2020 CLINICAL DATA:  Acute on chronic respiratory failure with hypoxia. EXAM: PORTABLE CHEST 1 VIEW patient is rotated. COMPARISON:  chest x-ray 10/11/2020, CT chest 06/22/2019 FINDINGS: Enteric tube courses below the hemidiaphragm with side port overlying the expected region of the stomach and tip collimated off view. Right PICC line with tip overlying the expected region of the distal superior vena cava. Endotracheal tube with tip approximately 5 cm above the carina. The heart size and mediastinal contours are within normal limits. Interval improved but persistent bilateral lower lung zone airspace opacities. No pulmonary edema. Trace left pleural effusion not excluded. No right pleural effusion. No pneumothorax. No acute  osseous abnormality. IMPRESSION: 1. Persistent bilateral lower lung zone airspace opacities. 2. Trace left pleural effusion not excluded. 3. Lines and tubes in stable position. Electronically Signed   By: Iven Finn M.D.   On: 10/12/2020 06:58   DG Chest Port 1 View  Result Date:  10/11/2020 CLINICAL DATA:  Acute respiratory failure with hypoxia EXAM: PORTABLE CHEST 1 VIEW COMPARISON:  Two days ago FINDINGS: Increasingly dense left lower lobe opacity obscuring the diaphragm. No edema, convincing effusion, or air leak. Endotracheal tube with tip just below the clavicular heads. The enteric tube reaches the stomach. Right PICC with tip at the SVC. IMPRESSION: 1. Worsening left lower lobe aeration. 2. Unremarkable hardware positioning. Electronically Signed   By: Monte Fantasia M.D.   On: 10/11/2020 05:42   DG CHEST PORT 1 VIEW  Result Date: 10/09/2020 CLINICAL DATA:  Hypoxia EXAM: PORTABLE CHEST 1 VIEW COMPARISON:  October 07, 2020 FINDINGS: Endotracheal tube tip is 5.2 cm above the carina. Nasogastric tube tip and side port are below the diaphragm. No pneumothorax. There is persistent airspace opacity in the left lower lobe. The right lung is now clear. Heart size and pulmonary vascularity are normal. No adenopathy. No bone lesions. IMPRESSION: Tube positions as described without evident pneumothorax. Persistent airspace opacity consistent with pneumonia left lower lung region. Right lung now clear. Heart size within normal limits. Electronically Signed   By: Lowella Grip III M.D.   On: 10/09/2020 09:46   DG Chest Port 1 View  Result Date: 10/07/2020 CLINICAL DATA:  Respiratory failure EXAM: PORTABLE CHEST 1 VIEW COMPARISON:  10/04/2020 FINDINGS: Endotracheal tube is seen 3.7 cm above the carina on this rotated examination. Nasogastric tube extends into the abdomen. Bibasilar pulmonary infiltrates, left greater than right, are again seen and appears stable since prior exam. Small left pleural  effusions suspected. No pneumothorax. Cardiac size within normal limits. IMPRESSION: Stable examination with unchanged support tubes and bibasilar pulmonary infiltrates, left greater than right. Possible small left pleural effusion. Electronically Signed   By: Fidela Salisbury MD   On: 10/07/2020 07:01   DG Chest Port 1 View  Result Date: 10/09/2020 CLINICAL DATA:  Respiratory failure EXAM: PORTABLE CHEST 1 VIEW COMPARISON:  10/04/2020 FINDINGS: Endotracheal tube seen 4.5 cm above the carina. Nasogastric tube extends into the abdomen. Pulmonary insufflation has slightly diminished, but is still symmetric and within normal limits. Bibasilar pulmonary infiltrates have developed left greater than right. Small left pleural effusion has developed. No pneumothorax. Cardiac size within normal limits. No acute bone abnormality. IMPRESSION: Stable support lines and tubes. Interval development of bibasilar pulmonary infiltrates and small left pleural effusion. Slight interval decrease in pulmonary insufflation. Electronically Signed   By: Fidela Salisbury MD   On: 10/01/2020 04:37   Portable Chest x-ray  Result Date: 10/04/2020 CLINICAL DATA:  Intubation. EXAM: PORTABLE CHEST 1 VIEW COMPARISON:  Chest x-ray 10/12/2020. FINDINGS: Endotracheal tube tip 4 cm above the carina. NG tube tip in the upper stomach. Side hole at the gastroesophageal junction. Advancement of the NG tube approximately 10 cm suggested. Heart size normal. Low lung volumes with mild bibasilar atelectasis. No pleural effusion or pneumothorax. IMPRESSION: 1. Endotracheal tube tip 4 cm above the carina. 2. NG tube tip in the upper stomach. Side hole at the gastroesophageal junction. Advancement of the NG tube approximately 10 cm suggested. 3. Low lung volumes with mild bibasilar atelectasis. Electronically Signed   By: Marcello Moores  Register   On: 10/04/2020 09:31   DG Chest Port 1 View  Result Date: 09/27/2020 CLINICAL DATA:  Fever.  Hyperglycemia. EXAM:  PORTABLE CHEST 1 VIEW COMPARISON:  12/24/2019 FINDINGS: The cardiomediastinal silhouette is within normal limits. EKG leads overlie the chest. There is mild asymmetric opacity in the left lung base. No pleural effusion or pneumothorax is  identified. No acute osseous abnormality is seen. IMPRESSION: Mild left basilar opacity may reflect atelectasis or infection. Electronically Signed   By: Logan Bores M.D.   On: 10/22/2020 15:05   DG Foot Complete Right  Result Date: 09/27/2020 CLINICAL DATA:  Toe infection. EXAM: RIGHT FOOT COMPLETE - 3+ VIEW COMPARISON:  None. FINDINGS: Limited portable study due to osteopenia and positioning. Within this limitation, no gross bony destruction evident to suggest osteomyelitis. Degenerative changes noted at the tibiotalar joint. IMPRESSION: 1. Limited portable study due to osteopenia and positioning. No gross bony destruction evident to suggest osteomyelitis. MRI would be a more sensitive means to evaluate as clinically warranted. 2. Degenerative changes at the tibiotalar joint. Electronically Signed   By: Misty Stanley M.D.   On: 10/24/2020 15:38   EEG adult  Result Date: 10/04/2020 Lora Havens, MD     10/04/2020 11:14 AM Patient Name: Tony [name] MRN: [id number] Epilepsy Attending: Lora Havens Referring Physician/Provider: Dr Kerney Elbe Date: 10/04/2020 Duration: 25.34 mins Patient history:  70 year old male with a history of Alzheimer's dementia and seizures presenting with AMS in the context of sepsis. Early this AM he exhibited seizure-like activity.  EEG to evaluate for seizures. Level of alertness:  lethargic AEDs during EEG study: Depakote Technical aspects: This EEG study was done with scalp electrodes positioned according to the 10-20 International system of electrode placement. Electrical activity was acquired at a sampling rate of 500Hz  and reviewed with a high frequency filter of 70Hz  and a low frequency filter of 1Hz . EEG data were recorded continuously  and digitally stored. Description: EEG showed continuous generalized 3 to 5 Hz theta-delta slowing. Hyperventilation and photic stimulation were not performed.   ABNORMALITY -Continuous slow, generalized IMPRESSION: This study is suggestive of moderate to severe diffuse encephalopathy, nonspecific etiology. No seizures or epileptiform discharges were seen throughout the recording. Priyanka O Yadav   VAS [place] MESENTERIC  Result Date: 10/05/2020 ABDOMINAL VISCERAL Indications: Rule out mesenteric ischemia Limitations: Air/bowel gas. Comparison Study: No prior studies. Performing Technologist: Darlin Coco RDMS,RVT  Examination Guidelines: A complete evaluation includes B-mode imaging, spectral Doppler, color Doppler, and power Doppler as needed of all accessible portions of each vessel. Bilateral testing is considered an integral part of a complete examination. Limited examinations for reoccurring indications may be performed as noted.  Duplex Findings: +----------------------+--------+--------+------+------------------------------+ Mesenteric            PSV cm/sEDV cm/sPlaque           Comments            +----------------------+--------+--------+------+------------------------------+ Aorta Prox              113                                                +----------------------+--------+--------+------+------------------------------+ Aorta Mid                91                                                +----------------------+--------+--------+------+------------------------------+ Aorta Distal             73                                                +----------------------+--------+--------+------+------------------------------+  Celiac Artery Origin    161                                                +----------------------+--------+--------+------+------------------------------+ Celiac Artery Proximal  159                                                 +----------------------+--------+--------+------+------------------------------+ SMA Origin              218                                                +----------------------+--------+--------+------+------------------------------+ SMA Proximal            291                  Velocities may be increased                                                 due to curvature of vessel   +----------------------+--------+--------+------+------------------------------+ SMA Mid                 153                                                +----------------------+--------+--------+------+------------------------------+ SMA Distal              108                                                +----------------------+--------+--------+------+------------------------------+ IMA                     182                                                +----------------------+--------+--------+------+------------------------------+    Summary: Mesenteric: Normal Celiac artery , Superior Mesenteric artery and Inferior Mesenteric artery findings.  *See table(s) above for measurements and observations.  Diagnosing physician: Harold Barban MD  Electronically signed by Harold Barban MD on 10/05/2020 at 10:09:33 PM.    Final    [place] EKG SITE RITE  Result Date: 10/10/2020 If Site Rite image not attached, placement could not be confirmed due to current cardiac rhythm.  DG FL GUIDED LUMBAR PUNCTURE  Result Date: 10/04/2020 CLINICAL DATA:  Worsening altered mental status. EXAM: DIAGNOSTIC LUMBAR PUNCTURE UNDER FLUOROSCOPIC GUIDANCE COMPARISON:  None. FLUOROSCOPY TIME:  Fluoroscopy Time:  3 minutes and 18 seconds. Radiation Exposure Index (if provided by the fluoroscopic device): 63 mGy Number of Acquired Spot Images: PROCEDURE: Due to patient altered mental status  and ventilator dependence, written informed consent was obtained from the patient's brother, Mr. Kaelan Amble. Emphasized risks included  bleeding, infection, and inability to obtain CSF. A time-out was performed prior to beginning the procedure. With the patient prone, appropriate skin sites over the lower back were identified using fluoroscopic guidance. The lower back was prepped with Betadine. 1% Lidocaine was used for local anesthesia. Initial puncture was performed at L2-3 and L3-4 with a 22 gauge spinal needle although no CSF could be obtained. At this point, I consulted one of our interventional radiology physician's who was then able to assist with the procedure. Patient was placed in a more oblique position and the lower back was re-prepped and redraped using standard sterile technique. Lumbar puncture was then subsequently performed at the L2-3 level using a 20 gauge needle with return of clear CSF. 8 ml of CSF were obtained for laboratory studies. The patient tolerated the procedure well and there were no apparent complications. IMPRESSION: Successful fluoro guided lumbar puncture at the L2-3 level with return of clear CSF. CSF aliquots of been sent to the laboratory for analysis. No evidence for immediate complications. Electronically Signed   By: Misty Stanley M.D.   On: 10/04/2020 17:36    Microbiology No results found for this or any previous visit (from the past 240 hour(s)).  Lab Basic Metabolic Panel: Recent Labs  Lab 10/15/20 0418 10/16/20 0314 10/17/20 0452  NA 142 142 139  K 3.8 3.7 3.8  CL 100 101 101  CO2 35* 34* 30  GLUCOSE 204* 111* 157*  BUN 47* 40* 35*  CREATININE 1.40* 1.28* 1.49*  CALCIUM 11.6* 10.8* 10.6*   Liver Function Tests: No results for input(s): AST, ALT, ALKPHOS, BILITOT, PROT, ALBUMIN in the last 168 hours. No results for input(s): LIPASE, AMYLASE in the last 168 hours. No results for input(s): AMMONIA in the last 168 hours. CBC: Recent Labs  Lab 10/15/20 0418 10/16/20 0314 10/17/20 0452  WBC 23.2* 26.5* 17.3*  HGB 7.5* 7.8* 7.0*  HCT 24.2* 26.1* 23.9*  MCV 103.0* 105.2*  107.2*  PLT 709* 742* 656*   Cardiac Enzymes: No results for input(s): CKTOTAL, CKMB, CKMBINDEX, TROPONINI in the last 168 hours. Sepsis Labs: Recent Labs  Lab 10/15/20 0418 10/16/20 0314 10/17/20 0452  WBC 23.2* 26.5* 17.3*    Procedures/Operations  Endotracheal intubation R first toe amputation 10/19/2020   Julian Hy 10-31-20, 11:43 AM

## 2020-10-28 NOTE — Progress Notes (Signed)
NAME:  Tony [name], MRN:  [id number], DOB:  1951/01/15, LOS: 23 ADMISSION DATE:  10/19/2020, CONSULTATION DATE: 3/8 REFERRING MD: Dr. Rosine Door, CHIEF COMPLAINT: Altered mental status  Brief History:  70 yo male admitted from [facility] on 3/07 with AMS, fever (Tm 101.7), tachycardia, lactic acidosis and hyperglycemia.  Required intubation for airway protection and transferred to ICU on 3/08.  Past Medical History:  Alzheimer's dementia, BPH, CKD 3a, DM type 2, HTN, Seizure disorder, CVA  Significant Hospital Events:  3/7 admitted for sepsis/DKA 3/8 unresponsive, intubated, transferred to ICU 3/10 Right great toe amputation 3/11 Tolerate SBT. Mental status precludes extubation. Cumulative fluid balance + 3/12 Weaned off sedation. Poor mental status. Tolerating SBT 3/13 Fever 3/18 mucus plug episode 3/19 extubated; DNR 3/21 tx to floor  Consults:  Neurology Palliative 3/22>>  Procedures:  ETT 3/8 > 3/19  Significant Diagnostic Tests:   CT head 3/7 > no evidence of acute intracranial abnormality  X-ray right foot 3/7 > no gross findings suspicious of osteomyelitis  MRI right foot 3/8 > osteomyelitis of 1st distal phalanx and 1st proximal phalanx, plantar muscle edema concerning for myositis  CT head 3/8 > remote b/l cerebellar infarcts  CT abdomen pelvis 3/8 > bibasilar consolidation, heavy stool burden throughout colon, diverticulosis of descending and sigmoid colon  LP 3/8 > glucose 139, protein 113, RBC 48, WBC 6  EEG 3/8 > generalized slowing  EEG 3/10 > No evidence of epileptogenicity  MRI R foot 3/15 >> no apparent osteo-, no abscess  Micro Data:  COVID/Flu 3/7 > negative MRSA PCR 3/7 > negative Blood culture 3/7 > Urine culture 3/7 > multiple species tracheal aspirate 3/8 > CSF 3/8 > negative Right bone/wound culture 3/10 > negative Blood 3/13 >> negative  Antimicrobials:  Cefepime 3/7 > 3/8 Vancomycin 3/7 > 3/8 Zosyn 3/8 > 3/16 Daptomycin  3/8 > 3/16 Vancomycin 3/16 >3/21 Ceftriaxone 3/16 > 3/21  Interim History / Subjective:  Somnolent, not responsive. More secretions this morning per RN.  Objective   Blood pressure (!) 106/59, pulse (!) 119, temperature 97.7 F (36.5 C), temperature source Axillary, resp. rate (!) 27, height 6' (1.829 m), weight 77.8 kg, SpO2 (!) 88 %.        Intake/Output Summary (Last 24 hours) at 11-15-20 0926 Last data filed at 10/20/2020 2232 Gross per 24 hour  Intake --  Output 650 ml  Net -650 ml   Filed Weights   10/15/20 0413 10/16/20 0500 10/17/20 0500  Weight: 81 kg 81.4 kg 77.8 kg   Physical Exam:  General - ill elderly man laying in bed in NAD, sleeping Eyes - closed ENT -[place]/AT, poor dentition. Scopolamine patch in place. Cardiac - S1S2, tachycardic, reg rhythm Chest -  Reduced right lateral breath sounds, some rhonchi. Saturating in 60s on RA. Abdomen - soft, ND Extremities - no peripheral edema Skin - warm, dry, no rashes Neuro - somnolent, not responsive to examination or verbal stimulation   Resolved Hospital Problem list   Hypernatremia, DKA, AKI from ATN secondary to sepsis, Lactic acidosis  Assessment:   Acute hypoxic respiratory failure with compromised airway due to aspiration pneumonitis. Sepsis from aspiration pneumonia and chronic osteomyelitis, myositis of Rt foot s/p right great toe amputation 1/06 Acute metabolic encephalopathy from sepsis, hyperglycemia. Hx of seizures, Alzheimer dementia, CVA. DM type 2 poorly controlled with hyperglycemia. CKD 3a. Hx of BPH with urine retention. Anemia of critical illness and chronic disease. Thrombocytosis. Hx of HTN. Actively dying  Plan:   -Continuing to focus on comfort and dignity- has morphine PRN for SOB, air hunger, pain. Family has brought in music for him to listen to. -Ativan PRN for seizures. Con't depakote. -con't glycopyrrolate for oral secretions PRN. Added scopolamine.  -Liberalized family  vistion; brother has requested to have him kept on continuous pulse oximetry to help with timing of visitation. -Appreciate Palliative Care team's assistance -No accuchecks and SSI given lack of perceived benefit at this stage and relative discomfort associated with this intervention -Anticipate he is within 24 hours of passing.  Signature:    Julian Hy, DO 11/12/2020 9:33 AM [facility]  From 7AM- 7PM if no response to pager, please call [phone]. After hours, 7PM- 7AM, please call Elink  [phone].

## 2020-10-28 DEATH — deceased

## 2020-11-02 LAB — FUNGUS CULTURE WITH STAIN

## 2020-11-02 LAB — FUNGUS CULTURE RESULT

## 2020-11-02 LAB — FUNGAL ORGANISM REFLEX

## 2020-11-08 LAB — FUNGAL ORGANISM REFLEX

## 2020-11-08 LAB — FUNGUS CULTURE WITH STAIN

## 2020-11-08 LAB — FUNGUS CULTURE RESULT

## 2021-05-26 IMAGING — RF DG SPINAL PUNCT LUMBAR DIAG WITH FL CT GUIDANCE
11 series · 11 of 11 positions shown · non-contrast
Comparison: None.

CLINICAL DATA: Worsening altered mental status.

EXAM:
DIAGNOSTIC LUMBAR PUNCTURE UNDER FLUOROSCOPIC GUIDANCE

[Series 1: fluoro_iodine 2fps_bw · 0.19mm/px · 1 of 1 slices shown (1 of 11)]
[im 1/1]
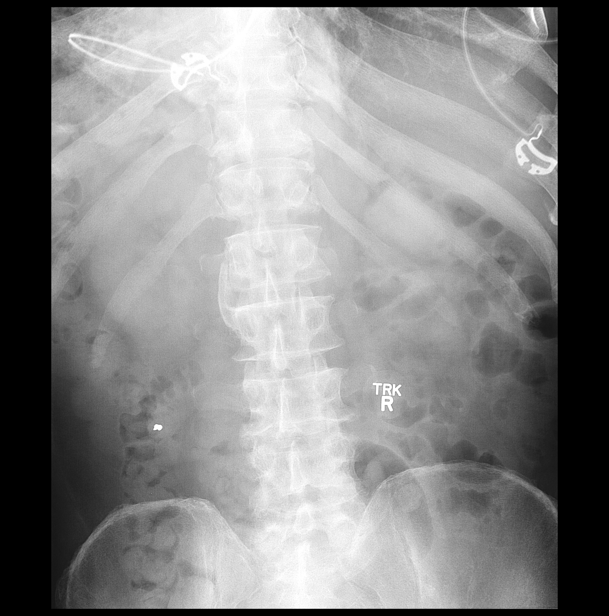

[Series 2: fluoro_iodine 2fps_bw · 0.19mm/px · 1 of 1 slices shown (2 of 11)]
[im 1/1]
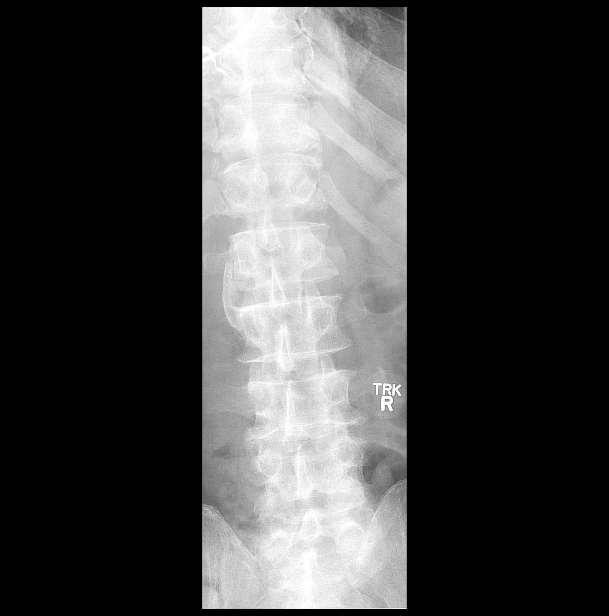

[Series 3: fluoro_iodine 2fps_bw · 0.19mm/px · 1 of 1 slices shown (3 of 11)]
[im 1/1]
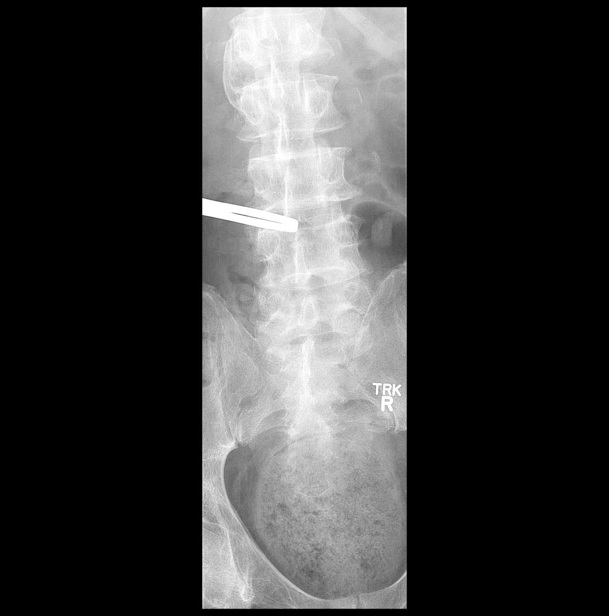

[Series 4: fluoro_iodine 2fps_bw · 0.19mm/px · 1 of 1 slices shown (4 of 11)]
[im 1/1]
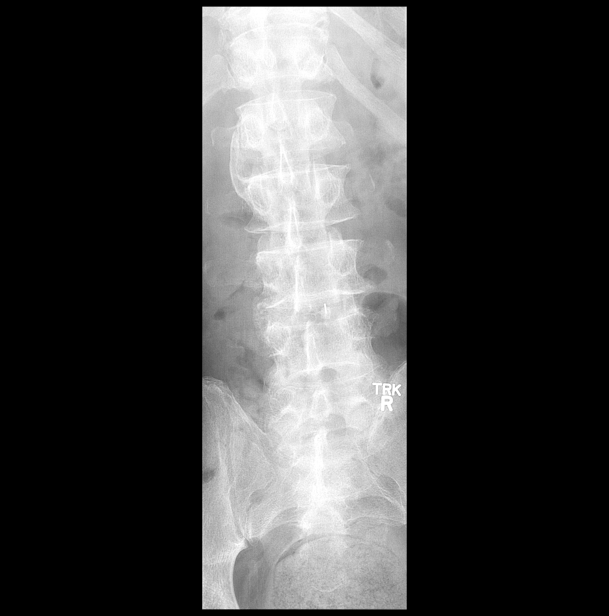

[Series 5: fluoro_iodine 2fps_bw · 0.19mm/px · 1 of 1 slices shown (5 of 11)]
[im 1/1]
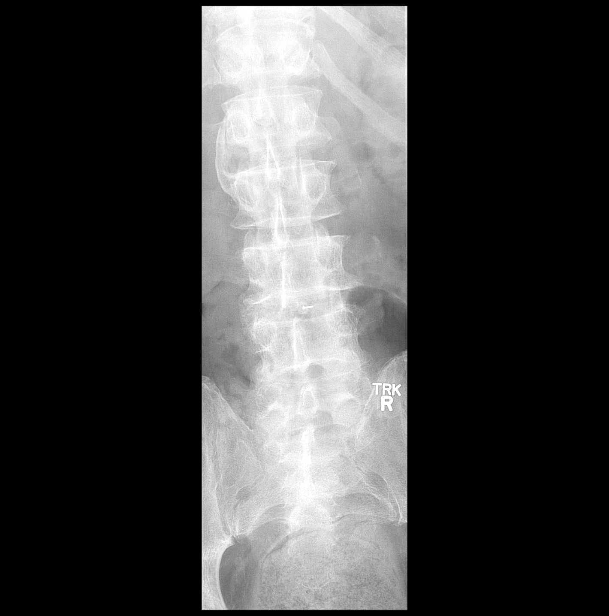

[Series 6: fluoro_iodine 2fps_bw · 0.19mm/px · 1 of 1 slices shown (6 of 11)]
[im 1/1]
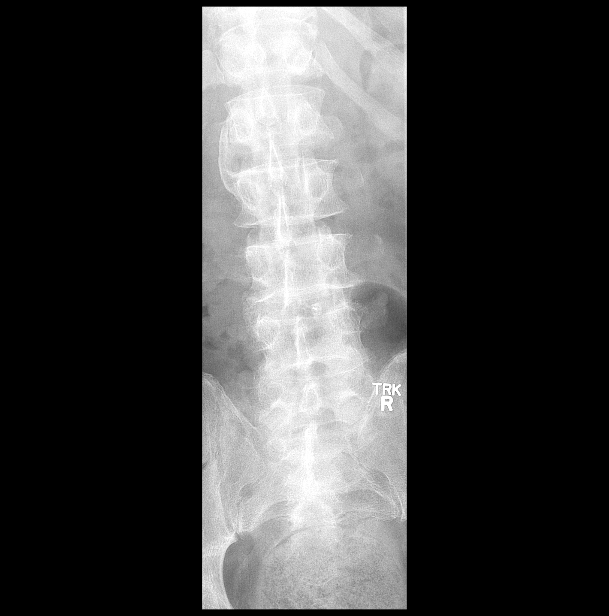

[Series 7: fluoro_iodine 2fps_bw · 0.17mm/px · 1 of 1 slices shown (7 of 11)]
[im 1/1]
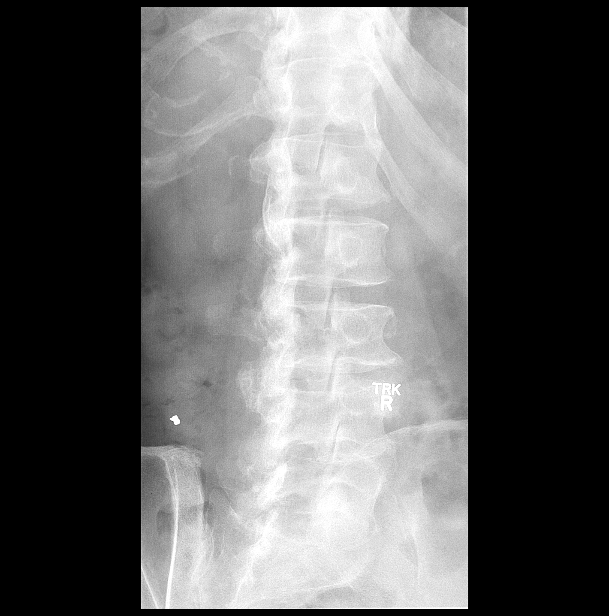

[Series 8: fluoro_iodine 2fps_bw · 0.17mm/px · 1 of 1 slices shown (8 of 11)]
[im 1/1]
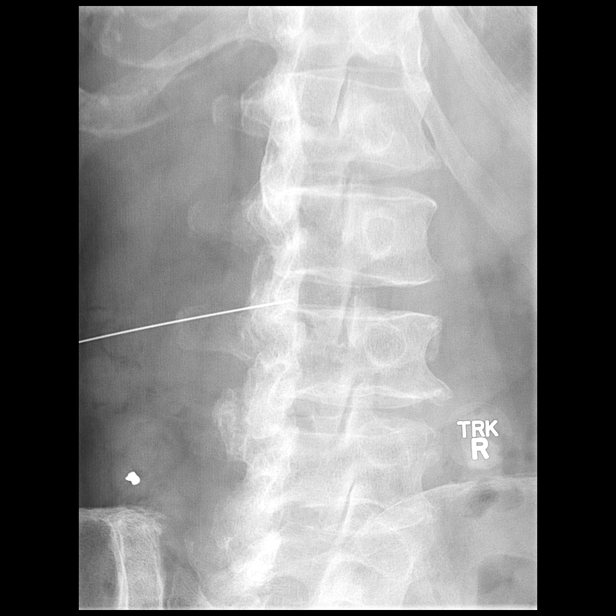

[Series 9: fluoro_iodine 2fps_bw · 0.17mm/px · 1 of 1 slices shown (9 of 11)]
[im 1/1]
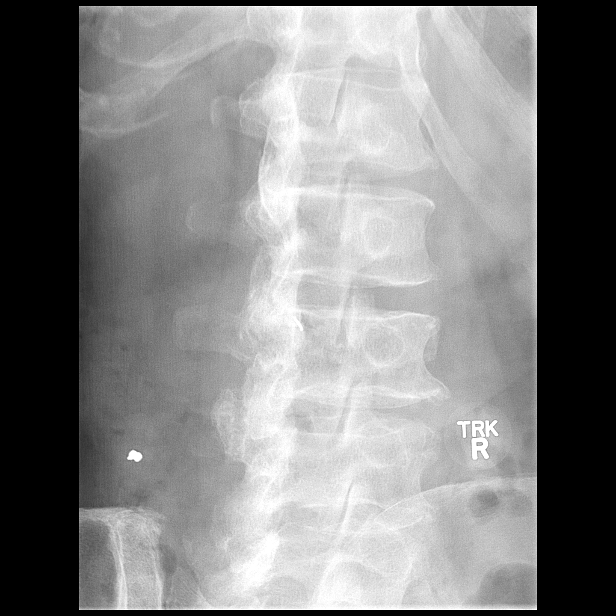

[Series 10: fluoro_iodine 2fps_bw · 0.17mm/px · 1 of 1 slices shown (10 of 11)]
[im 1/1]
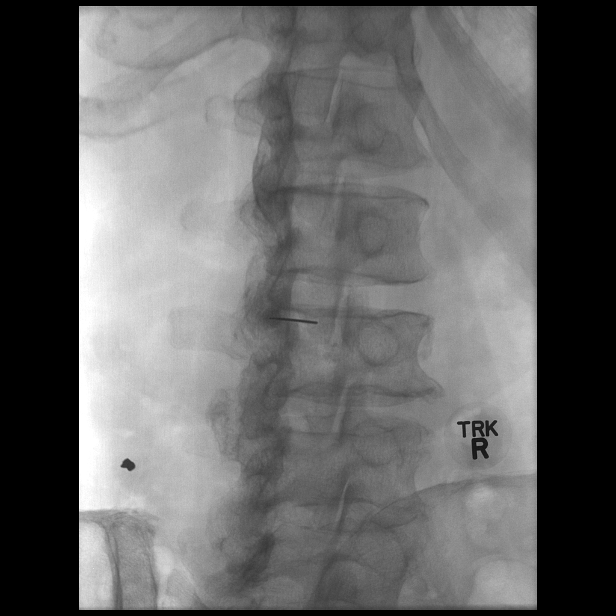

[Series 11: fluoro_iodine 2fps_bw · 0.17mm/px · 1 of 1 slices shown (11 of 11)]
[im 1/1]
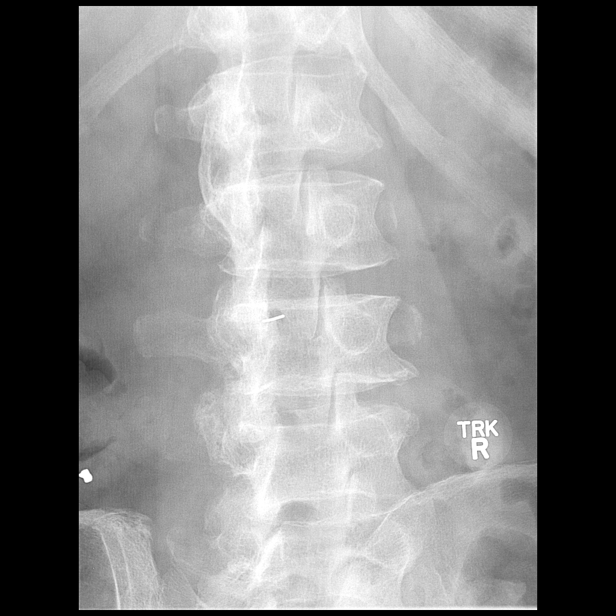

[11 of 11 positions shown; findings below may reference images not displayed]

FLUOROSCOPY TIME:  Fluoroscopy Time:  3 minutes and 18 seconds.

Radiation Exposure Index (if provided by the fluoroscopic device):
63 mGy

Number of Acquired Spot Images:

PROCEDURE:
Due to patient altered mental status and ventilator dependence,
written informed consent was obtained from the patient's brother,
Mr. Kristinsdottir Primel. Emphasized risks included bleeding, infection, and
inability to obtain CSF.

A time-out was performed prior to beginning the procedure.

With the patient prone, appropriate skin sites over the lower back
were identified using fluoroscopic guidance. The lower back was
prepped with Betadine. 1% Lidocaine was used for local anesthesia.

Initial puncture was performed at L2-3 and L3-4 with a 22 gauge
spinal needle although no CSF could be obtained. At this point, I
consulted one of our interventional radiology physician's who was
then able to assist with the procedure. Patient was placed in a more
oblique position and the lower back was re-prepped and redraped
using standard sterile technique. Lumbar puncture was then
subsequently performed at the L2-3 level using a 20 gauge needle
with return of clear CSF. 8 ml of CSF were obtained for laboratory
studies. The patient tolerated the procedure well and there were no
apparent complications.
IMPRESSION: Successful fluoro guided lumbar puncture at the L2-3 level with
return of clear CSF. CSF aliquots of been sent to the laboratory for
analysis.

No evidence for immediate complications.
